# Patient Record
Sex: Female | Born: 1945 | Race: White | Hispanic: No | Marital: Married | Smoking: Never smoker
Health system: Southern US, Community
[De-identification: ages and names within clinical notes are randomized; demographics above are authoritative.]

## PROBLEM LIST (undated history)

## (undated) DIAGNOSIS — I499 Cardiac arrhythmia, unspecified: Secondary | ICD-10-CM

## (undated) DIAGNOSIS — E785 Hyperlipidemia, unspecified: Secondary | ICD-10-CM

## (undated) DIAGNOSIS — K219 Gastro-esophageal reflux disease without esophagitis: Secondary | ICD-10-CM

## (undated) DIAGNOSIS — I10 Essential (primary) hypertension: Secondary | ICD-10-CM

## (undated) DIAGNOSIS — E119 Type 2 diabetes mellitus without complications: Secondary | ICD-10-CM

## (undated) DIAGNOSIS — Z85828 Personal history of other malignant neoplasm of skin: Secondary | ICD-10-CM

## (undated) DIAGNOSIS — G4730 Sleep apnea, unspecified: Secondary | ICD-10-CM

## (undated) DIAGNOSIS — H269 Unspecified cataract: Secondary | ICD-10-CM

## (undated) HISTORY — DX: Gastro-esophageal reflux disease without esophagitis: K21.9

## (undated) HISTORY — PX: CARDIAC ELECTROPHYSIOLOGY STUDY AND ABLATION: SHX1294

## (undated) HISTORY — PX: TUBAL LIGATION: SHX77

## (undated) HISTORY — DX: Hyperlipidemia, unspecified: E78.5

## (undated) HISTORY — PX: LAPAROSCOPIC OOPHERECTOMY: SHX6507

## (undated) HISTORY — PX: APPENDECTOMY: SHX54

## (undated) HISTORY — DX: Unspecified cataract: H26.9

## (undated) HISTORY — PX: HAMMER TOE SURGERY: SHX385

## (undated) HISTORY — PX: OTHER SURGICAL HISTORY: SHX169

## (undated) HISTORY — PX: HAND SURGERY: SHX662

## (undated) HISTORY — DX: Essential (primary) hypertension: I10

## (undated) HISTORY — DX: Type 2 diabetes mellitus without complications: E11.9

## (undated) HISTORY — DX: Personal history of other malignant neoplasm of skin: Z85.828

---

## 2001-10-29 ENCOUNTER — Other Ambulatory Visit: Admission: RE | Admit: 2001-10-29 | Discharge: 2001-10-29 | Payer: Self-pay | Admitting: Family Medicine

## 2004-08-13 ENCOUNTER — Other Ambulatory Visit: Payer: Self-pay

## 2004-08-13 ENCOUNTER — Emergency Department: Payer: Self-pay | Admitting: Emergency Medicine

## 2005-02-20 ENCOUNTER — Ambulatory Visit: Payer: Self-pay | Admitting: Family Medicine

## 2006-02-21 ENCOUNTER — Ambulatory Visit: Payer: Self-pay | Admitting: Family Medicine

## 2007-03-05 ENCOUNTER — Ambulatory Visit: Payer: Self-pay | Admitting: Family Medicine

## 2007-08-26 ENCOUNTER — Emergency Department: Payer: Self-pay | Admitting: Emergency Medicine

## 2007-08-26 ENCOUNTER — Other Ambulatory Visit: Payer: Self-pay

## 2007-09-16 DIAGNOSIS — Z8619 Personal history of other infectious and parasitic diseases: Secondary | ICD-10-CM | POA: Insufficient documentation

## 2007-10-27 ENCOUNTER — Ambulatory Visit: Payer: Self-pay | Admitting: Gynecologic Oncology

## 2007-11-19 ENCOUNTER — Ambulatory Visit: Payer: Self-pay | Admitting: Gynecologic Oncology

## 2007-12-03 ENCOUNTER — Ambulatory Visit: Payer: Self-pay | Admitting: Gynecologic Oncology

## 2007-12-27 ENCOUNTER — Ambulatory Visit: Payer: Self-pay

## 2008-01-07 ENCOUNTER — Ambulatory Visit: Payer: Self-pay

## 2008-03-06 ENCOUNTER — Ambulatory Visit: Payer: Self-pay | Admitting: Family Medicine

## 2008-03-28 ENCOUNTER — Ambulatory Visit: Payer: Self-pay | Admitting: Internal Medicine

## 2008-04-03 DIAGNOSIS — R7309 Other abnormal glucose: Secondary | ICD-10-CM | POA: Insufficient documentation

## 2008-04-03 DIAGNOSIS — IMO0002 Reserved for concepts with insufficient information to code with codable children: Secondary | ICD-10-CM | POA: Insufficient documentation

## 2008-04-10 ENCOUNTER — Ambulatory Visit: Payer: Self-pay | Admitting: Internal Medicine

## 2008-04-28 ENCOUNTER — Ambulatory Visit: Payer: Self-pay | Admitting: Internal Medicine

## 2008-05-28 ENCOUNTER — Ambulatory Visit: Payer: Self-pay | Admitting: Internal Medicine

## 2008-06-02 ENCOUNTER — Ambulatory Visit: Payer: Self-pay | Admitting: Internal Medicine

## 2008-06-28 ENCOUNTER — Ambulatory Visit: Payer: Self-pay | Admitting: Internal Medicine

## 2008-07-28 ENCOUNTER — Ambulatory Visit: Payer: Self-pay | Admitting: Internal Medicine

## 2008-09-28 ENCOUNTER — Ambulatory Visit: Payer: Self-pay | Admitting: Internal Medicine

## 2008-10-21 ENCOUNTER — Ambulatory Visit: Payer: Self-pay | Admitting: Internal Medicine

## 2008-10-26 ENCOUNTER — Ambulatory Visit: Payer: Self-pay | Admitting: Internal Medicine

## 2008-12-26 ENCOUNTER — Ambulatory Visit: Payer: Self-pay | Admitting: Internal Medicine

## 2009-01-20 ENCOUNTER — Ambulatory Visit: Payer: Self-pay | Admitting: Internal Medicine

## 2009-03-17 ENCOUNTER — Ambulatory Visit: Payer: Self-pay | Admitting: Family Medicine

## 2009-05-09 ENCOUNTER — Ambulatory Visit: Payer: Self-pay | Admitting: Internal Medicine

## 2009-05-26 ENCOUNTER — Ambulatory Visit: Payer: Self-pay | Admitting: Internal Medicine

## 2009-07-28 ENCOUNTER — Ambulatory Visit: Payer: Self-pay | Admitting: Internal Medicine

## 2009-08-28 ENCOUNTER — Ambulatory Visit: Payer: Self-pay | Admitting: Internal Medicine

## 2010-01-26 ENCOUNTER — Ambulatory Visit: Payer: Self-pay | Admitting: Internal Medicine

## 2010-02-24 DIAGNOSIS — R319 Hematuria, unspecified: Secondary | ICD-10-CM | POA: Insufficient documentation

## 2010-02-25 ENCOUNTER — Ambulatory Visit: Payer: Self-pay | Admitting: Internal Medicine

## 2010-03-11 DIAGNOSIS — I4891 Unspecified atrial fibrillation: Secondary | ICD-10-CM | POA: Insufficient documentation

## 2010-03-30 ENCOUNTER — Ambulatory Visit: Payer: Self-pay | Admitting: Family Medicine

## 2010-07-28 ENCOUNTER — Ambulatory Visit: Payer: Self-pay | Admitting: Internal Medicine

## 2010-08-04 ENCOUNTER — Ambulatory Visit: Payer: Self-pay

## 2010-08-28 ENCOUNTER — Ambulatory Visit: Payer: Self-pay | Admitting: Internal Medicine

## 2010-10-11 ENCOUNTER — Ambulatory Visit: Payer: Self-pay | Admitting: Unknown Physician Specialty

## 2010-10-11 LAB — HM COLONOSCOPY

## 2010-10-13 LAB — PATHOLOGY REPORT

## 2011-01-27 ENCOUNTER — Ambulatory Visit: Payer: Self-pay | Admitting: Internal Medicine

## 2011-02-26 ENCOUNTER — Ambulatory Visit: Payer: Self-pay | Admitting: Internal Medicine

## 2011-04-28 ENCOUNTER — Ambulatory Visit: Payer: Self-pay | Admitting: Family Medicine

## 2011-06-26 LAB — HM PAP SMEAR: HM PAP: NEGATIVE

## 2012-01-30 ENCOUNTER — Ambulatory Visit: Payer: Self-pay | Admitting: Oncology

## 2012-01-30 LAB — CBC CANCER CENTER
Basophil #: 0 x10 3/mm (ref 0.0–0.1)
Basophil %: 0.8 %
Eosinophil #: 0 x10 3/mm (ref 0.0–0.7)
Eosinophil %: 0.7 %
HGB: 13 g/dL (ref 12.0–16.0)
Lymphocyte #: 1.3 x10 3/mm (ref 1.0–3.6)
MCH: 31.8 pg (ref 26.0–34.0)
MCV: 94 fL (ref 80–100)
Monocyte %: 9.3 %
Neutrophil %: 65.4 %
RBC: 4.09 10*6/uL (ref 3.80–5.20)
RDW: 13.4 % (ref 11.5–14.5)

## 2012-02-26 ENCOUNTER — Ambulatory Visit: Payer: Self-pay | Admitting: Oncology

## 2012-05-09 ENCOUNTER — Ambulatory Visit: Payer: Self-pay | Admitting: Family Medicine

## 2012-07-04 LAB — HM DEXA SCAN: HM DEXA SCAN: NORMAL

## 2012-10-01 DIAGNOSIS — D473 Essential (hemorrhagic) thrombocythemia: Secondary | ICD-10-CM | POA: Insufficient documentation

## 2013-01-26 ENCOUNTER — Ambulatory Visit: Payer: Self-pay | Admitting: Oncology

## 2013-02-04 LAB — CBC CANCER CENTER
Basophil #: 0 x10 3/mm (ref 0.0–0.1)
Basophil %: 0.8 %
Eosinophil %: 0.8 %
HCT: 38.4 % (ref 35.0–47.0)
HGB: 13.6 g/dL (ref 12.0–16.0)
Lymphocyte #: 1.5 x10 3/mm (ref 1.0–3.6)
Lymphocyte %: 24.8 %
MCV: 93 fL (ref 80–100)
Monocyte %: 8.8 %
Neutrophil #: 3.9 x10 3/mm (ref 1.4–6.5)
Neutrophil %: 64.8 %
Platelet: 124 x10 3/mm — ABNORMAL LOW (ref 150–440)
RDW: 13.6 % (ref 11.5–14.5)

## 2013-02-05 DIAGNOSIS — Z85828 Personal history of other malignant neoplasm of skin: Secondary | ICD-10-CM | POA: Insufficient documentation

## 2013-02-25 ENCOUNTER — Ambulatory Visit: Payer: Self-pay | Admitting: Oncology

## 2013-04-25 ENCOUNTER — Ambulatory Visit: Payer: Self-pay | Admitting: Family Medicine

## 2013-05-12 ENCOUNTER — Ambulatory Visit: Payer: Self-pay | Admitting: Family Medicine

## 2013-05-20 ENCOUNTER — Ambulatory Visit: Payer: Self-pay | Admitting: Family Medicine

## 2013-07-14 ENCOUNTER — Ambulatory Visit: Payer: Self-pay

## 2013-07-28 ENCOUNTER — Ambulatory Visit: Payer: Self-pay

## 2014-05-26 LAB — TSH: TSH: 2.65 u[IU]/mL (ref <=5.90)

## 2014-05-28 HISTORY — PX: EYE SURGERY: SHX253

## 2014-06-09 ENCOUNTER — Ambulatory Visit: Payer: Self-pay | Admitting: Family Medicine

## 2014-06-09 LAB — HM MAMMOGRAPHY

## 2014-09-14 LAB — BASIC METABOLIC PANEL
BUN: 16 mg/dL (ref 4–21)
Creatinine: 0.6 mg/dL (ref 0.5–1.1)
Glucose: 141 mg/dL
POTASSIUM: 4.3 mmol/L (ref 3.4–5.3)
SODIUM: 140 mmol/L (ref 137–147)

## 2014-09-14 LAB — LIPID PANEL
Cholesterol: 157 mg/dL (ref 0–200)
HDL: 41 mg/dL (ref 35–70)
LDL CALC: 76 mg/dL
TRIGLYCERIDES: 201 mg/dL — AB (ref 40–160)

## 2014-09-14 LAB — CBC AND DIFFERENTIAL
HEMATOCRIT: 38 % (ref 36–46)
HEMOGLOBIN: 13 g/dL (ref 12.0–16.0)
Platelets: 120 10*3/uL — AB (ref 150–399)
WBC: 5.2 10^3/mL

## 2014-09-14 LAB — HEPATIC FUNCTION PANEL
ALT: 18 U/L (ref 7–35)
AST: 22 U/L (ref 13–35)

## 2014-12-03 ENCOUNTER — Emergency Department
Admit: 2014-12-03 | Disposition: A | Discharge status: Home / Self Care | Payer: Self-pay | Admitting: Emergency Medicine

## 2014-12-03 LAB — BASIC METABOLIC PANEL
Anion Gap: 8 (ref 7–16)
BUN: 19 mg/dL
CALCIUM: 9.7 mg/dL
CHLORIDE: 101 mmol/L
Co2: 30 mmol/L
Creatinine: 0.65 mg/dL
EGFR (Non-African Amer.): >60
GLUCOSE: 150 mg/dL — AB
Potassium: 4.1 mmol/L
Sodium: 139 mmol/L

## 2014-12-03 LAB — CBC
HCT: 39.7 % (ref 35.0–47.0)
HGB: 13.4 g/dL (ref 12.0–16.0)
MCH: 32.1 pg (ref 26.0–34.0)
MCHC: 33.8 g/dL (ref 32.0–36.0)
MCV: 95 fL (ref 80–100)
Platelet: 111 10*3/uL — ABNORMAL LOW (ref 150–440)
RBC: 4.19 10*6/uL (ref 3.80–5.20)
RDW: 13 % (ref 11.5–14.5)
WBC: 6.9 10*3/uL (ref 3.6–11.0)

## 2014-12-03 LAB — TROPONIN I: Troponin-I: <0.03 ng/mL

## 2015-01-11 DIAGNOSIS — I071 Rheumatic tricuspid insufficiency: Secondary | ICD-10-CM | POA: Insufficient documentation

## 2015-01-11 DIAGNOSIS — R001 Bradycardia, unspecified: Secondary | ICD-10-CM | POA: Insufficient documentation

## 2015-01-20 LAB — HEMOGLOBIN A1C: Hgb A1c MFr Bld: 6.2 % — AB (ref 4.0–6.0)

## 2015-03-17 ENCOUNTER — Other Ambulatory Visit: Payer: Self-pay | Admitting: Family Medicine

## 2015-03-17 DIAGNOSIS — E785 Hyperlipidemia, unspecified: Secondary | ICD-10-CM

## 2015-05-17 DIAGNOSIS — Z6837 Body mass index (BMI) 37.0-37.9, adult: Secondary | ICD-10-CM | POA: Insufficient documentation

## 2015-05-17 DIAGNOSIS — K2100 Gastro-esophageal reflux disease with esophagitis, without bleeding: Secondary | ICD-10-CM | POA: Insufficient documentation

## 2015-05-17 DIAGNOSIS — S96919A Strain of unspecified muscle and tendon at ankle and foot level, unspecified foot, initial encounter: Secondary | ICD-10-CM | POA: Insufficient documentation

## 2015-05-17 DIAGNOSIS — E119 Type 2 diabetes mellitus without complications: Secondary | ICD-10-CM | POA: Insufficient documentation

## 2015-05-17 DIAGNOSIS — R109 Unspecified abdominal pain: Secondary | ICD-10-CM | POA: Insufficient documentation

## 2015-05-17 DIAGNOSIS — E1159 Type 2 diabetes mellitus with other circulatory complications: Secondary | ICD-10-CM | POA: Insufficient documentation

## 2015-05-17 DIAGNOSIS — R42 Dizziness and giddiness: Secondary | ICD-10-CM | POA: Insufficient documentation

## 2015-05-17 DIAGNOSIS — I10 Essential (primary) hypertension: Secondary | ICD-10-CM | POA: Insufficient documentation

## 2015-05-17 DIAGNOSIS — R110 Nausea: Secondary | ICD-10-CM | POA: Insufficient documentation

## 2015-05-18 ENCOUNTER — Encounter: Payer: Self-pay | Admitting: Family Medicine

## 2015-05-18 ENCOUNTER — Ambulatory Visit (INDEPENDENT_AMBULATORY_CARE_PROVIDER_SITE_OTHER): Payer: Medicare Other | Admitting: Family Medicine

## 2015-05-18 VITALS — BP 144/72 | HR 72 | Temp 98.0°F | Resp 16 | Ht 65.0 in | Wt 215.0 lb

## 2015-05-18 DIAGNOSIS — E119 Type 2 diabetes mellitus without complications: Secondary | ICD-10-CM

## 2015-05-18 DIAGNOSIS — I480 Paroxysmal atrial fibrillation: Secondary | ICD-10-CM | POA: Diagnosis not present

## 2015-05-18 DIAGNOSIS — Z1231 Encounter for screening mammogram for malignant neoplasm of breast: Secondary | ICD-10-CM

## 2015-05-18 DIAGNOSIS — I10 Essential (primary) hypertension: Secondary | ICD-10-CM

## 2015-05-18 LAB — POCT GLYCOSYLATED HEMOGLOBIN (HGB A1C)
ESTIMATED AVERAGE GLUCOSE: 137
HEMOGLOBIN A1C: 6.4

## 2015-05-18 LAB — POCT UA - MICROALBUMIN: Microalbumin Ur, POC: NEGATIVE mg/L

## 2015-05-18 NOTE — Progress Notes (Signed)
Subjective:    Patient ID: Stacie Porter, female    DOB: November 07, 1945, 69 y.o.   MRN: [id number]  Diabetes She presents for her follow-up (Last A1C on 01/21/2015 and was 6.2%) diabetic visit. She has type 2 diabetes mellitus. There are no hypoglycemic associated symptoms. Pertinent negatives for hypoglycemia include no headaches or sweats. Associated symptoms include weight loss (through weight watchers; lost 18 pounds total). Pertinent negatives for diabetes include no blurred vision, no chest pain, no fatigue, no foot paresthesias, no foot ulcerations, no polydipsia, no polyphagia, no polyuria, no visual change and no weakness. When asked about current treatments, none were reported. She is compliant with treatment all of the time. She is following a generally healthy diet. She participates in exercise daily. Home blood sugar record trend: BS not being checked. An ACE inhibitor/angiotensin II receptor blocker is being taken. Eye exam is current (July 2016, Dr. Gloriann Loan. No diabetic retinopathy).  Hypertension This is a chronic problem. Associated symptoms include palpitations (sees cardiology for A-Fib). Pertinent negatives include no anxiety, blurred vision, chest pain, headaches, malaise/fatigue, neck pain, orthopnea, peripheral edema, shortness of breath or sweats. Treatments tried: Lisinopril-HCTZ 10-12.5 mg, Metoprolol 25 mg. There are no compliance problems.    No recent episodes of atrial fibrillation. Has decreased her Metoprolol to 1/2 twice a day.  BP is up today. Did take Sudafed.    Review of Systems  Constitutional: Positive for weight loss (through weight watchers; lost 18 pounds total). Negative for malaise/fatigue and fatigue.  HENT: Positive for rhinorrhea.   Eyes: Negative for blurred vision.  Respiratory: Negative for shortness of breath.   Cardiovascular: Positive for palpitations (sees cardiology for A-Fib). Negative for chest pain and orthopnea.  Endocrine: Negative for  polydipsia, polyphagia and polyuria.  Musculoskeletal: Negative for neck pain.  Neurological: Negative for weakness and headaches.     Patient Active Problem List   Diagnosis Date Noted  . Abdominal pain 05/17/2015  . Strain of tendon of foot and ankle 05/17/2015  . Adult BMI 30+ 05/17/2015  . Esophagitis, reflux 05/17/2015  . Well controlled diabetes mellitus 05/17/2015  . Dizziness 05/17/2015  . Essential (primary) hypertension 05/17/2015  . Feeling bilious 05/17/2015  . Hyperlipemia 03/17/2015  . Bradycardia 01/11/2015  . TI (tricuspid incompetence) 01/11/2015  . H/O malignant neoplasm of skin 02/05/2013  . 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 A-fib 03/11/2010  . Blood in the urine 02/24/2010  . Abnormal blood sugar 04/03/2008  . Change in blood platelet count 04/03/2008  . Herpes zoster without complication 09/73/5329   No past medical history on file. Current Outpatient Prescriptions on File Prior to Visit  Medication Sig  . BOSWELLIA-GLUCOSAMINE-VIT D PO Take 1 tablet by mouth daily.  . Calcium Carbonate-Vitamin D 600-200 MG-UNIT CAPS Take 1 tablet by mouth daily.  Marland Kitchen lisinopril-hydrochlorothiazide (PRINZIDE,ZESTORETIC) 10-12.5 MG per tablet Take 1 tablet by mouth daily.  . metoprolol tartrate (LOPRESSOR) 25 MG tablet Take 12.5 mg by mouth 2 (two) times daily.   . MULTIPLE VITAMIN PO Take 1 tablet by mouth daily.  Marland Kitchen omeprazole (PRILOSEC) 20 MG capsule Take 40 mg by mouth daily.  . simvastatin (ZOCOR) 20 MG tablet TAKE 1 TABLET BY MOUTH DAILY   No current facility-administered medications on file prior to visit.   Allergies  Allergen Reactions  . Meloxicam Swelling  . Multaq  [Dronedarone]    No past surgical history on file. Social History   Social History  . Marital Status: Married    Spouse  Name: N/A  . Number of Children: N/A  . Years of Education: N/A   Occupational History  . Not on file.   Social History Main Topics  . Smoking  status: Never Smoker   . Smokeless tobacco: Never Used  . Alcohol Use: No  . Drug Use: No  . Sexual Activity: Not on file   Other Topics Concern  . Not on file   Social History Narrative  . No narrative on file   No family history on file.    Objective:   Physical Exam  Constitutional: She is oriented to person, place, and time. She appears well-developed and well-nourished.  Cardiovascular: Normal rate and regular rhythm.   Pulmonary/Chest: Effort normal and breath sounds normal.  Neurological: She is alert and oriented to person, place, and time.  Psychiatric: She has a normal mood and affect. Her behavior is normal. Judgment and thought content normal.   Diabetic Foot Exam - Simple   Simple Foot Form  Diabetic Foot exam was performed with the following findings:  Yes 05/18/2015 11:21 AM  Visual Inspection  See comments:  Yes  Sensation Testing  See comments:  Yes  Pulse Check  Posterior Tibialis and Dorsalis pulse intact bilaterally:  Yes  Comments  Does have second toe, hammer toe on top of great toe. Some numbness in great toe. Otherwise norma.      Results for orders placed or performed in visit on 05/18/15  POCT glycosylated hemoglobin (Hb A1C)  Result Value Ref Range   Hemoglobin A1C 6.4    Est. average glucose Bld gHb Est-mCnc 137   POCT UA - Microalbumin  Result Value Ref Range   Microalbumin Ur, POC Negative mg/L     BP 144/72 mmHg  Pulse 72  Temp(Src) 98 F (36.7 C) (Oral)  Resp 16  Ht 5\' 5"  (1.651 m)  Wt 215 lb (97.523 kg)  BMI 35.78 kg/m2       Assessment & Plan:   1. Well controlled diabetes mellitus Stable. Continue Weight Watchers.  Recheck in January.  FLu shot at pharmacy when feeling better.    - POCT glycosylated hemoglobin (Hb A1C) - POCT UA - Microalbumin  2. Paroxysmal atrial fibrillation Stable. No episodes since ablation.   3. Essential (primary) hypertension Up today. Recommend monitor blood pressure. No sudafed.   Margarita Rana, MD

## 2015-06-21 ENCOUNTER — Ambulatory Visit
Admission: RE | Admit: 2015-06-21 | Discharge: 2015-06-21 | Disposition: A | Discharge status: Home / Self Care | Payer: Medicare Other | Source: Ambulatory Visit | Attending: Family Medicine | Admitting: Family Medicine

## 2015-06-21 ENCOUNTER — Other Ambulatory Visit: Payer: Self-pay | Admitting: Family Medicine

## 2015-06-21 DIAGNOSIS — Z1231 Encounter for screening mammogram for malignant neoplasm of breast: Secondary | ICD-10-CM

## 2015-07-12 DIAGNOSIS — I480 Paroxysmal atrial fibrillation: Secondary | ICD-10-CM | POA: Insufficient documentation

## 2015-07-12 DIAGNOSIS — E782 Mixed hyperlipidemia: Secondary | ICD-10-CM | POA: Insufficient documentation

## 2015-07-12 DIAGNOSIS — I340 Nonrheumatic mitral (valve) insufficiency: Secondary | ICD-10-CM | POA: Insufficient documentation

## 2015-07-12 DIAGNOSIS — E785 Hyperlipidemia, unspecified: Secondary | ICD-10-CM | POA: Insufficient documentation

## 2015-07-12 DIAGNOSIS — E1169 Type 2 diabetes mellitus with other specified complication: Secondary | ICD-10-CM | POA: Insufficient documentation

## 2015-08-02 ENCOUNTER — Ambulatory Visit (INDEPENDENT_AMBULATORY_CARE_PROVIDER_SITE_OTHER): Payer: Medicare Other | Admitting: Family Medicine

## 2015-08-02 ENCOUNTER — Encounter: Payer: Self-pay | Admitting: Family Medicine

## 2015-08-02 VITALS — BP 144/68 | HR 72 | Temp 97.8°F | Resp 16 | Wt 214.0 lb

## 2015-08-02 DIAGNOSIS — N644 Mastodynia: Secondary | ICD-10-CM

## 2015-08-02 NOTE — Progress Notes (Signed)
Subjective:    Patient ID: Stacie Porter, female    DOB: [birth year], 69 y.o.   MRN: [id number]  HPI  Breast Pain Intermittent right breast pain since 07/29/2015 (x 5 days). Pt mainly notices pain with palpation, and reports it is a "radiating" pain. Pt just had a bilateral screening mammogram on 10/242016, and the result was BI-RADS 1. Pt denies breast swelling, discoloration, dimpling, nipple discharge, or breast mass.   Review of Systems  Constitutional: Negative for fever, chills, diaphoresis, activity change, appetite change, fatigue and unexpected weight change.  Respiratory: Negative for cough, shortness of breath and wheezing.   Cardiovascular: Negative for chest pain, palpitations and leg swelling.   BP 144/68 mmHg  Pulse 72  Temp(Src) 97.8 F (36.6 C) (Oral)  Resp 16  Wt 214 lb (97.07 kg)   Patient Active Problem List   Diagnosis Date Noted  . 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 MI (mitral incompetence) 07/12/2015  . Paroxysmal atrial fibrillation ([place]) 07/12/2015  . Abdominal pain 05/17/2015  . Strain of tendon of foot and ankle 05/17/2015  . Adult BMI 30+ 05/17/2015  . Esophagitis, reflux 05/17/2015  . Well controlled diabetes mellitus ([place]) 05/17/2015  . Dizziness 05/17/2015  . Essential (primary) hypertension 05/17/2015  . Feeling bilious 05/17/2015  . Hyperlipemia 03/17/2015  . Bradycardia 01/11/2015  . TI (tricuspid incompetence) 01/11/2015  . H/O malignant neoplasm of skin 02/05/2013  . Essential hemorrhagic thrombocythemia ([place]) 10/01/2012  . Essential thrombocythemia ([place]) 10/01/2012  . A-fib ([place]) 03/11/2010  . Blood in the urine 02/24/2010  . Abnormal blood sugar 04/03/2008  . Change in blood platelet count 04/03/2008  . Herpes zoster without complication [id number]   No past medical history on file. Current Outpatient Prescriptions on File Prior to Visit  Medication Sig  . BOSWELLIA-GLUCOSAMINE-VIT D PO Take 1 tablet  by mouth daily.  . Calcium Carbonate-Vitamin D 600-200 MG-UNIT CAPS Take 1 tablet by mouth daily.  Marland Kitchen lisinopril-hydrochlorothiazide (PRINZIDE,ZESTORETIC) 10-12.5 MG per tablet Take 1 tablet by mouth daily.  . metoprolol tartrate (LOPRESSOR) 25 MG tablet Take 12.5 mg by mouth 2 (two) times daily.   . MULTIPLE VITAMIN PO Take 1 tablet by mouth daily.  Marland Kitchen omeprazole (PRILOSEC) 20 MG capsule Take 40 mg by mouth daily.  . simvastatin (ZOCOR) 20 MG tablet TAKE 1 TABLET BY MOUTH DAILY   No current facility-administered medications on file prior to visit.   Allergies  Allergen Reactions  . Meloxicam Swelling  . Multaq  [Dronedarone]    No past surgical history on file. Social History   Social History  . Marital Status: Married    Spouse Name: N/A  . Number of Children: N/A  . Years of Education: N/A   Occupational History  . Not on file.   Social History Main Topics  . Smoking status: Never Smoker   . Smokeless tobacco: Never Used  . Alcohol Use: No  . Drug Use: No  . Sexual Activity: Not on file   Other Topics Concern  . Not on file   Social History Narrative   Family History  Problem Relation Age of Onset  . Breast cancer Neg Hx      .result     Objective:   Physical Exam  Constitutional: She appears well-developed and well-nourished. No distress.  Genitourinary: There is breast tenderness (tenderness at 3 o'clock position on right nipple, no obvious lesions ). No breast swelling, discharge or bleeding.  Skin: She is not diaphoretic.  BP 144/68 mmHg  Pulse 72  Temp(Src) 97.8 F (36.6 C) (Oral)  Resp 16  Wt 214 lb (97.07 kg)     Assessment & Plan:  1. Breast pain, right Unclear etiology. Will treat with OTC anti-inflammatories and check ultrasound. Further plan pending these results.   Call sooner if symptoms change.  - US BREAST COMPLETE UNI RIGHT INC AXILLA; Future  Margarita Rana, MD

## 2015-08-03 ENCOUNTER — Telehealth: Payer: Self-pay | Admitting: Family Medicine

## 2015-08-03 DIAGNOSIS — N644 Mastodynia: Secondary | ICD-10-CM

## 2015-08-03 NOTE — Telephone Encounter (Signed)
Per [facility] they will also need an order for left breast ultrasound and diagnostic mammogram

## 2015-08-03 NOTE — Telephone Encounter (Signed)
Ordered. Thank you for checking up on this. Renaldo Fiddler, CMA

## 2015-08-03 NOTE — Telephone Encounter (Signed)
I was unable to get pt into [place] until 07/2015 to get diagnostic mammogram/ultrasound.Pt was upset about this and wants to know if you think it is ok to wait this long

## 2015-08-03 NOTE — Telephone Encounter (Signed)
Per [facility]

## 2015-08-06 ENCOUNTER — Ambulatory Visit (INDEPENDENT_AMBULATORY_CARE_PROVIDER_SITE_OTHER): Payer: Medicare Other | Admitting: Family Medicine

## 2015-08-06 ENCOUNTER — Encounter: Payer: Self-pay | Admitting: Family Medicine

## 2015-08-06 VITALS — BP 132/74 | HR 64 | Temp 97.9°F | Resp 16 | Wt 213.0 lb

## 2015-08-06 DIAGNOSIS — I10 Essential (primary) hypertension: Secondary | ICD-10-CM

## 2015-08-06 DIAGNOSIS — N644 Mastodynia: Secondary | ICD-10-CM

## 2015-08-06 DIAGNOSIS — J069 Acute upper respiratory infection, unspecified: Secondary | ICD-10-CM | POA: Diagnosis not present

## 2015-08-06 MED ORDER — ASPIRIN EC 81 MG PO TBEC: 81.0000 mg | DELAYED_RELEASE_TABLET | Freq: Every day | ORAL | Status: DC

## 2015-08-06 NOTE — Progress Notes (Signed)
Subjective:     Patient ID: Stacie Porter, female   DOB: 1946-06-04, 69 y.o.   MRN: [id number]  Chief Complaint  Patient presents with  . Sinusitis  . Hypertension    Pt is concerned because her BP has been elevated, up to 189/72. Pt currently taking Lisinopril-HCTZ 10-12.5 mg po qd, and Metoprolol Tartrate 25 mg 1/2 tab po in the am, and 1/2 tab po qhs    Sinusitis This is a new problem. The current episode started in the past 7 days (3 days ago). The problem has been gradually improving since onset. There has been no fever. She is experiencing no pain. Associated symptoms include congestion. Pertinent negatives include no coughing, shortness of breath, sneezing, sore throat or swollen glands. Sinus pressure: frontal, temporal. Treatments tried: Coricidin. The treatment provided no relief (Has not taken any sudaphed.).  Hypertension Pertinent negatives include no chest pain, palpitations or shortness of breath.  Pt measured BP yesterday was high. Measured with battery-powered cuff. 5:13pm was 187/72, highest 190/66. Pt sat down and rested 5:30pm was 154/66. She took evening dose metoprolol 12.5 mg, BPs remained in 140s-150s/60s. No associated sx including no headache, vision changes, chest pain, SOB, numbness, tingling. This am BP was 151/59.    She did not have a particularly stressful day, just got back from running errands before measuring high BP. She has been taking BP meds as prescribed. Took Coricidin and no meds that would raise BP for congestion. In general has been more stressed because sister is scheduled to have lung surgery for stage I lung cancer 12/29 and she is really the only one who can help to support her. Does get a little tearful talking about this.    Breast Pain Last seen 12/5 for breast pain. Taking ibuprofen, which is is helping, still slightly sore around nipple. She did not take ibuprofen this am. Last bilateral screening mammogram on 10/242016 result BI-RADS 1.  Ultrasound scheduled Dec 20th, which might be difficult to make due to holidays. Pain is improving.  Only sore when touching it. Still no rash or other lesions noted.    Review of Systems  Constitutional: Negative for activity change and unexpected weight change.  HENT: Positive for congestion. Negative for sneezing and sore throat. Sinus pressure: frontal, temporal.   Eyes: Negative.   Respiratory: Negative for cough, chest tightness and shortness of breath.   Cardiovascular: Negative for chest pain and palpitations.  Gastrointestinal: Negative.   Endocrine: Negative.   Genitourinary: Negative.   Musculoskeletal: Negative.   Skin: Negative.   Neurological: Negative.   Hematological: Negative.   Psychiatric/Behavioral: Negative.      Patient Active Problem List   Diagnosis Date Noted  . 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 MI (mitral incompetence) 07/12/2015  . Paroxysmal atrial fibrillation ([place]) 07/12/2015  . Abdominal pain 05/17/2015  . Strain of tendon of foot and ankle 05/17/2015  . Adult BMI 30+ 05/17/2015  . Esophagitis, reflux 05/17/2015  . Well controlled diabetes mellitus ([place]) 05/17/2015  . Dizziness 05/17/2015  . Essential (primary) hypertension 05/17/2015  . Feeling bilious 05/17/2015  . Hyperlipemia 03/17/2015  . Bradycardia 01/11/2015  . TI (tricuspid incompetence) 01/11/2015  . H/O malignant neoplasm of skin 02/05/2013  . 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 Essential thrombocythemia ([place]) 10/01/2012  . A-fib ([place]) 03/11/2010  . Blood in the urine 02/24/2010  . Abnormal blood sugar 04/03/2008  . Change in blood platelet count 04/03/2008  . Herpes zoster without  complication [id number]    Previous Medications   BOSWELLIA-GLUCOSAMINE-VIT D PO    Take 1 tablet by mouth daily.   CALCIUM CARBONATE-VITAMIN D 600-200 MG-UNIT CAPS    Take 1 tablet by mouth daily.   IBUPROFEN (ADVIL,MOTRIN) 200 MG TABLET    Take  400 mg by mouth 3 (three) times daily.   LISINOPRIL-HYDROCHLOROTHIAZIDE (PRINZIDE,ZESTORETIC) 10-12.5 MG PER TABLET    Take 1 tablet by mouth daily.   METOPROLOL TARTRATE (LOPRESSOR) 25 MG TABLET    Take 12.5 mg by mouth 2 (two) times daily.    MULTIPLE VITAMIN PO    Take 1 tablet by mouth daily.   OMEPRAZOLE (PRILOSEC) 20 MG CAPSULE    Take 40 mg by mouth daily.   SIMVASTATIN (ZOCOR) 20 MG TABLET    TAKE 1 TABLET BY MOUTH DAILY    Allergies  Allergen Reactions  . Meloxicam Swelling  . Multaq  [Dronedarone]   .diamge  No past surgical history on file.   Family History  Problem Relation Age of Onset  . Breast cancer Neg Hx    Social History   Social History  . Marital Status: Married    Spouse Name: N/A  . Number of Children: N/A  . Years of Education: N/A   Occupational History  . Not on file.   Social History Main Topics  . Smoking status: Never Smoker   . Smokeless tobacco: Never Used  . Alcohol Use: No  . Drug Use: No  . Sexual Activity: Not on file   Other Topics Concern  . Not on file   Social History Narrative  Recently more stressed. Pt's sister is diagnosed with stage I lung and stage I thyroid cancer. Lobectomy scheduled for 08/26/15. Will have thyroid surgery later.       Objective:   Physical Exam  Constitutional: She is oriented to person, place, and time. She appears well-developed and well-nourished. No distress.  HENT:  Head: Normocephalic and atraumatic.  Right Ear: External ear normal.  Left Ear: External ear normal.  Nose: Nose normal.  Mouth/Throat: Oropharynx is clear and moist. No oropharyngeal exudate.  Rhinorrhea noted.   Eyes: Right eye exhibits no discharge. Left eye exhibits no discharge.  Neck: Normal range of motion. Neck supple.  Cardiovascular: Normal rate and regular rhythm.  Exam reveals no gallop and no friction rub.   Murmur (Grade 1/6 holosystolic) heard. Pulmonary/Chest: Effort normal and breath sounds normal. No  respiratory distress. She has no wheezes.  Abdominal: Soft.  Lymphadenopathy:    She has no cervical adenopathy.  Neurological: She is alert and oriented to person, place, and time. No cranial nerve deficit. She exhibits normal muscle tone.  Skin: Skin is warm and dry. She is not diaphoretic. No erythema.  Psychiatric: She has a normal mood and affect. Her behavior is normal. Judgment and thought content normal.  Tearful when discussing her sister.    Vitals reviewed.   BP 132/74 mmHg  Pulse 64  Temp(Src) 97.9 F (36.6 C) (Oral)  Resp 16  Wt 213 lb (96.616 kg)  SpO2 97%     Assessment:     Elevated BP.  URI. Breast pain.   Assessment and plan below.     Plan:     1. Essential (primary) hypertension Worsening. Suspect related to Ibuprofen. Will stop Ibuprofen and continue to monitor. If elevated, take and extra Metoprolol and call the office. Will check kidney function.  Call if worsens or does not improve.  - CBC with  Differential/Platelet - Comprehensive Metabolic Panel (CMET)  2. Breast pain Improved. Stop Ibuprofen for now.    3. Upper respiratory infection Improving. Patient instructed to call back if condition worsens or does not continue to improve.     Margarita Rana, MD

## 2015-08-07 DIAGNOSIS — J069 Acute upper respiratory infection, unspecified: Secondary | ICD-10-CM | POA: Insufficient documentation

## 2015-08-07 DIAGNOSIS — N644 Mastodynia: Secondary | ICD-10-CM | POA: Insufficient documentation

## 2015-08-07 LAB — COMPREHENSIVE METABOLIC PANEL
A/G RATIO: 1.6 (ref 1.1–2.5)
ALK PHOS: 72 IU/L (ref 39–117)
ALT: 17 IU/L (ref 0–32)
AST: 22 IU/L (ref 0–40)
Albumin: 4.2 g/dL (ref 3.6–4.8)
BILIRUBIN TOTAL: 0.9 mg/dL (ref 0.0–1.2)
BUN / CREAT RATIO: 23 (ref 11–26)
BUN: 15 mg/dL (ref 8–27)
CHLORIDE: 101 mmol/L (ref 97–106)
CO2: 29 mmol/L (ref 18–29)
Calcium: 9.7 mg/dL (ref 8.7–10.3)
Creatinine, Ser: 0.66 mg/dL (ref 0.57–1.00)
GFR calc Af Amer: 105 mL/min/{1.73_m2} (ref >59)
GFR calc non Af Amer: 91 mL/min/{1.73_m2} (ref >59)
GLUCOSE: 113 mg/dL — AB (ref 65–99)
Globulin, Total: 2.6 g/dL (ref 1.5–4.5)
POTASSIUM: 4.3 mmol/L (ref 3.5–5.2)
Sodium: 142 mmol/L (ref 136–144)
Total Protein: 6.8 g/dL (ref 6.0–8.5)

## 2015-08-07 LAB — CBC WITH DIFFERENTIAL/PLATELET
BASOS ABS: 0 10*3/uL (ref 0.0–0.2)
Basos: 0 %
EOS (ABSOLUTE): 0.1 10*3/uL (ref 0.0–0.4)
Eos: 1 %
Hematocrit: 40.5 % (ref 34.0–46.6)
Hemoglobin: 13.8 g/dL (ref 11.1–15.9)
Immature Grans (Abs): 0 10*3/uL (ref 0.0–0.1)
Immature Granulocytes: 0 %
LYMPHS ABS: 1.8 10*3/uL (ref 0.7–3.1)
Lymphs: 26 %
MCH: 32.1 pg (ref 26.6–33.0)
MCHC: 34.1 g/dL (ref 31.5–35.7)
MCV: 94 fL (ref 79–97)
MONOS ABS: 0.6 10*3/uL (ref 0.1–0.9)
Monocytes: 8 %
NEUTROS ABS: 4.7 10*3/uL (ref 1.4–7.0)
Neutrophils: 65 %
PLATELETS: 142 10*3/uL — AB (ref 150–379)
RBC: 4.3 x10E6/uL (ref 3.77–5.28)
RDW: 13.3 % (ref 12.3–15.4)
WBC: 7.2 10*3/uL (ref 3.4–10.8)

## 2015-08-17 ENCOUNTER — Other Ambulatory Visit: Payer: Self-pay | Admitting: Family Medicine

## 2015-08-17 ENCOUNTER — Ambulatory Visit
Admission: RE | Admit: 2015-08-17 | Discharge: 2015-08-17 | Disposition: A | Discharge status: Home / Self Care | Payer: Medicare Other | Source: Ambulatory Visit | Attending: Family Medicine | Admitting: Family Medicine

## 2015-08-17 DIAGNOSIS — N644 Mastodynia: Secondary | ICD-10-CM

## 2015-09-07 ENCOUNTER — Other Ambulatory Visit: Payer: Self-pay | Admitting: Family Medicine

## 2015-09-07 DIAGNOSIS — K2100 Gastro-esophageal reflux disease with esophagitis, without bleeding: Secondary | ICD-10-CM

## 2015-09-17 ENCOUNTER — Telehealth: Payer: Self-pay | Admitting: Family Medicine

## 2015-09-17 DIAGNOSIS — E119 Type 2 diabetes mellitus without complications: Secondary | ICD-10-CM

## 2015-09-17 DIAGNOSIS — I10 Essential (primary) hypertension: Secondary | ICD-10-CM

## 2015-09-17 DIAGNOSIS — E785 Hyperlipidemia, unspecified: Secondary | ICD-10-CM

## 2015-09-17 NOTE — Telephone Encounter (Signed)
Ok to print out lab slip. Thanks.  

## 2015-09-17 NOTE — Telephone Encounter (Signed)
Pt is requesting to pick up a lab slip on Monday if possible to have labs before her CPE.  CB#[phone]/MW

## 2015-09-20 NOTE — Telephone Encounter (Signed)
Printed off lab slip and informed pt it is ready to pick up. Renaldo Fiddler, CMA

## 2015-09-22 ENCOUNTER — Telehealth: Payer: Self-pay

## 2015-09-22 LAB — CBC WITH DIFFERENTIAL/PLATELET
BASOS ABS: 0 10*3/uL (ref 0.0–0.2)
Basos: 0 %
EOS (ABSOLUTE): 0.1 10*3/uL (ref 0.0–0.4)
Eos: 1 %
HEMATOCRIT: 39.2 % (ref 34.0–46.6)
Hemoglobin: 13.5 g/dL (ref 11.1–15.9)
IMMATURE GRANULOCYTES: 0 %
Immature Grans (Abs): 0 10*3/uL (ref 0.0–0.1)
LYMPHS ABS: 1.6 10*3/uL (ref 0.7–3.1)
Lymphs: 27 %
MCH: 31.7 pg (ref 26.6–33.0)
MCHC: 34.4 g/dL (ref 31.5–35.7)
MCV: 92 fL (ref 79–97)
MONOS ABS: 0.6 10*3/uL (ref 0.1–0.9)
Monocytes: 10 %
NEUTROS PCT: 62 %
Neutrophils Absolute: 3.7 10*3/uL (ref 1.4–7.0)
PLATELETS: 129 10*3/uL — AB (ref 150–379)
RBC: 4.26 x10E6/uL (ref 3.77–5.28)
RDW: 13.7 % (ref 12.3–15.4)
WBC: 6 10*3/uL (ref 3.4–10.8)

## 2015-09-22 LAB — COMPREHENSIVE METABOLIC PANEL
ALK PHOS: 65 IU/L (ref 39–117)
ALT: 18 IU/L (ref 0–32)
AST: 20 IU/L (ref 0–40)
Albumin/Globulin Ratio: 1.8 (ref 1.1–2.5)
Albumin: 4.2 g/dL (ref 3.6–4.8)
BUN/Creatinine Ratio: 28 — ABNORMAL HIGH (ref 11–26)
BUN: 18 mg/dL (ref 8–27)
Bilirubin Total: 0.7 mg/dL (ref 0.0–1.2)
CO2: 26 mmol/L (ref 18–29)
CREATININE: 0.65 mg/dL (ref 0.57–1.00)
Calcium: 9.4 mg/dL (ref 8.7–10.3)
Chloride: 101 mmol/L (ref 96–106)
GFR calc Af Amer: 105 mL/min/{1.73_m2} (ref >59)
GFR calc non Af Amer: 92 mL/min/{1.73_m2} (ref >59)
GLOBULIN, TOTAL: 2.4 g/dL (ref 1.5–4.5)
GLUCOSE: 135 mg/dL — AB (ref 65–99)
POTASSIUM: 4.8 mmol/L (ref 3.5–5.2)
SODIUM: 143 mmol/L (ref 134–144)
Total Protein: 6.6 g/dL (ref 6.0–8.5)

## 2015-09-22 LAB — LIPID PANEL
CHOLESTEROL TOTAL: 158 mg/dL (ref 100–199)
Chol/HDL Ratio: 4 ratio units (ref 0.0–4.4)
HDL: 40 mg/dL (ref >39)
LDL Calculated: 78 mg/dL (ref 0–99)
Triglycerides: 198 mg/dL — ABNORMAL HIGH (ref 0–149)
VLDL Cholesterol Cal: 40 mg/dL (ref 5–40)

## 2015-09-22 LAB — TSH: TSH: 1.84 u[IU]/mL (ref 0.450–4.500)

## 2015-09-22 LAB — HEMOGLOBIN A1C
ESTIMATED AVERAGE GLUCOSE: 140 mg/dL
HEMOGLOBIN A1C: 6.5 % — AB (ref 4.8–5.6)

## 2015-09-22 NOTE — Telephone Encounter (Signed)
Advised pt of lab results. Pt verbally acknowledges understanding. Emily Drozdowski, CMA   

## 2015-09-22 NOTE — Telephone Encounter (Signed)
-----   Message from Margarita Rana, MD sent at 09/22/2015  8:42 AM EST ----- Labs stable. Please notify patient. Thanks.

## 2015-09-27 ENCOUNTER — Encounter: Payer: Self-pay | Admitting: Family Medicine

## 2015-09-27 ENCOUNTER — Ambulatory Visit (INDEPENDENT_AMBULATORY_CARE_PROVIDER_SITE_OTHER): Payer: Medicare Other | Admitting: Family Medicine

## 2015-09-27 VITALS — BP 116/64 | HR 60 | Temp 98.7°F | Resp 16 | Ht 65.0 in | Wt 218.0 lb

## 2015-09-27 DIAGNOSIS — Z0000 Encounter for general adult medical examination without abnormal findings: Secondary | ICD-10-CM

## 2015-09-27 NOTE — Progress Notes (Signed)
Patient ID: Stacie Porter, female   DOB: [birth year], 70 y.o.   MRN: [id number]         Patient: Stacie Porter, Female    DOB: 16-Mar-1946, 70 y.o.   MRN: [id number] Visit Date: 09/27/2015  Today's Provider: Margarita Rana, MD   Chief Complaint  Patient presents with  . Medicare Wellness   Subjective:    Annual wellness visit Stacie Porter is a 70 y.o. female. She feels well. She reports exercising 5 days a week. She reports she is sleeping well.  09/23/14 CPE 08/26/11 Pap-neg; HPV-neg 06/21/15 Mammo-BI-RADS 1 10/11/10 Colon-int hemorrhoids, recheck in 10 yrs 07/04/12 BMD-normal  Lab Results  Component Value Date   WBC 6.0 09/21/2015   HGB 13.4 12/03/2014   HCT 39.2 09/21/2015   PLT 129* 09/21/2015   GLUCOSE 135* 09/21/2015   CHOL 158 09/21/2015   TRIG 198* 09/21/2015   HDL 40 09/21/2015   LDLCALC 78 09/21/2015   ALT 18 09/21/2015   AST 20 09/21/2015   NA 143 09/21/2015   K 4.8 09/21/2015   CL 101 09/21/2015   CREATININE 0.65 09/21/2015   BUN 18 09/21/2015   CO2 26 09/21/2015   TSH 1.840 09/21/2015   HGBA1C 6.5* 09/21/2015     -----------------------------------------------------------   Review of Systems  Constitutional: Negative.   HENT: Negative.   Eyes: Negative.   Respiratory: Negative.   Cardiovascular: Negative.   Gastrointestinal: Negative.   Endocrine: Negative.   Genitourinary: Negative.   Musculoskeletal: Negative.   Skin: Negative.   Allergic/Immunologic: Negative.   Neurological: Negative.   Hematological: Negative.   Psychiatric/Behavioral: Negative.     Social History   Social History  . Marital Status: Married    Spouse Name: N/A  . Number of Children: N/A  . Years of Education: N/A   Occupational History  . Not on file.   Social History Main Topics  . Smoking status: Never Smoker   . Smokeless tobacco: Never Used  . Alcohol Use: No  . Drug Use: No  . Sexual Activity: Not on file   Other Topics Concern  . Not on file     Social History Narrative    Past Medical History  Diagnosis Date  . Hyperlipidemia   . Hypertension      Patient Active Problem List   Diagnosis Date Noted  . Breast pain 08/07/2015  . Upper respiratory infection 08/07/2015  . 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 MI (mitral incompetence) 07/12/2015  . Paroxysmal atrial fibrillation ([place]) 07/12/2015  . Abdominal pain 05/17/2015  . Strain of tendon of foot and ankle 05/17/2015  . Adult BMI 30+ 05/17/2015  . Esophagitis, reflux 05/17/2015  . Well controlled diabetes mellitus ([place]) 05/17/2015  . Dizziness 05/17/2015  . Essential (primary) hypertension 05/17/2015  . Feeling bilious 05/17/2015  . Hyperlipemia 03/17/2015  . TI (tricuspid incompetence) 01/11/2015  . H/O malignant neoplasm of skin 02/05/2013  . Essential thrombocythemia ([place]) 10/01/2012  . Blood in the urine 02/24/2010  . Abnormal blood sugar 04/03/2008  . Change in blood platelet count 04/03/2008  . Herpes zoster without complication [id number]    Past Surgical History  Procedure Laterality Date  . Appendectomy    . Hand surgery Bilateral   . Hammer toe surgery Right   . Tubal ligation    . Cather ablation  2011/2014    cardiac ablation for a fib  . Eye surgery Bilateral 05/2014    cataract  . Laparoscopic oopherectomy  Her family history includes Cancer in her sister and sister; Heart disease in her father and mother; Hypertension in her father; Lung cancer in her sister; Ovarian cancer in her sister; Thyroid cancer in her sister. There is no history of Breast cancer.    Previous Medications   ASPIRIN EC 81 MG TABLET    Take 1 tablet (81 mg total) by mouth daily.   CALCIUM CARBONATE-VITAMIN D 600-200 MG-UNIT CAPS    Take 1 tablet by mouth daily.   GLUCOSAMINE-CHONDROITIN 500-400 MG CAPS    Take 1 tablet by mouth daily.   LISINOPRIL-HYDROCHLOROTHIAZIDE (PRINZIDE,ZESTORETIC) 10-12.5 MG PER TABLET    Take 1 tablet by  mouth daily.   METOPROLOL TARTRATE (LOPRESSOR) 25 MG TABLET    Take 12.5 mg by mouth 2 (two) times daily.    MULTIPLE VITAMIN PO    Take 1 tablet by mouth daily.   OMEPRAZOLE (PRILOSEC) 20 MG CAPSULE    TAKE 2 CAPSULES BY MOUTH EVERY DAY   SIMVASTATIN (ZOCOR) 20 MG TABLET    TAKE 1 TABLET BY MOUTH DAILY    Patient Care Team: Margarita Rana, MD as PCP - General (Family Medicine)     Objective:   Vitals: BP 116/64 mmHg  Pulse 60  Temp(Src) 98.7 F (37.1 C) (Oral)  Resp 16  Ht 5\' 5"  (1.651 m)  Wt 218 lb (98.884 kg)  BMI 36.28 kg/m2  SpO2 97%  Physical Exam  Constitutional: She is oriented to person, place, and time. She appears well-developed and well-nourished.  HENT:  Head: Normocephalic and atraumatic.  Right Ear: Tympanic membrane, external ear and ear canal normal.  Left Ear: Tympanic membrane, external ear and ear canal normal.  Nose: Nose normal.  Mouth/Throat: Uvula is midline, oropharynx is clear and moist and mucous membranes are normal.  Eyes: Conjunctivae, EOM and lids are normal. Pupils are equal, round, and reactive to light.  Neck: Trachea normal and normal range of motion. Neck supple. Carotid bruit is not present. No thyroid mass and no thyromegaly present.  Cardiovascular: Normal rate, regular rhythm and normal heart sounds.   Pulmonary/Chest: Effort normal and breath sounds normal.  Abdominal: Soft. Normal appearance and bowel sounds are normal. There is no hepatosplenomegaly. There is no tenderness.  Genitourinary: No breast swelling, tenderness or discharge.  Musculoskeletal: Normal range of motion.  Lymphadenopathy:    She has no cervical adenopathy.    She has no axillary adenopathy.  Neurological: She is alert and oriented to person, place, and time. She has normal strength. No cranial nerve deficit.  Skin: Skin is warm, dry and intact.  Psychiatric: She has a normal mood and affect. Her speech is normal and behavior is normal. Judgment and thought  content normal. Cognition and memory are normal.    Activities of Daily Living In your present state of health, do you have any difficulty performing the following activities: 09/27/2015  Hearing? N  Vision? N  Difficulty concentrating or making decisions? N  Walking or climbing stairs? N  Dressing or bathing? N  Doing errands, shopping? N    Fall Risk Assessment Fall Risk  09/27/2015  Falls in the past year? No     Depression Screen PHQ 2/9 Scores 09/27/2015  PHQ - 2 Score 0    Cognitive Testing - 6-CIT  Correct? Score   What year is it? yes 0 0 or 4  What month is it? yes 0 0 or 3  Memorize:    Pia Mau,  43,  [place],      What time is it? (within 1 hour) yes 0 0 or 3  Count backwards from 20 yes 0 0, 2, or 4  Name the months of the year yes 0 0, 2, or 4  Repeat name & address above yes 0 0, 2, 4, 6, 8, or 10       TOTAL SCORE  0/28   Interpretation:  Normal  Normal (0-7) Abnormal (8-28)       Assessment & Plan:     Annual Wellness Visit  Reviewed patient's Family Medical History Reviewed and updated list of patient's medical providers Assessment of cognitive impairment was done Assessed patient's functional ability Established a written schedule for health screening [place] Completed and Reviewed  Exercise Activities and Dietary recommendations Goals    None      Immunization History  Administered Date(s) Administered  . Influenza,inj,Quad PF,36+ Mos 06/29/2015  . Pneumococcal Conjugate-13 09/23/2014  . Pneumococcal Polysaccharide-23 07/01/2012  . Td 08/28/1997, 03/20/2008  . Tdap 09/23/2014      1. Medicare annual wellness visit, subsequent Stable. Patient advised to continue eating healthy and exercise daily. Reviewed labs from last week. Will recheck ov and recheck A1c at ov in 4 months.      Patient seen and examined by Dr. Jerrell Belfast, and note scribed by Philbert Riser. Dim[place], CMA.  I have  reviewed the document for accuracy and completeness and I agree with above. Jerrell Belfast, MD   Margarita Rana, MD   ------------------------------------------------------------------------------------------------------------

## 2016-01-25 ENCOUNTER — Encounter: Payer: Self-pay | Admitting: Family Medicine

## 2016-01-25 ENCOUNTER — Ambulatory Visit (INDEPENDENT_AMBULATORY_CARE_PROVIDER_SITE_OTHER): Payer: Medicare Other | Admitting: Family Medicine

## 2016-01-25 VITALS — BP 138/68 | HR 68 | Temp 98.4°F | Resp 16 | Wt 221.0 lb

## 2016-01-25 DIAGNOSIS — K2100 Gastro-esophageal reflux disease with esophagitis, without bleeding: Secondary | ICD-10-CM

## 2016-01-25 DIAGNOSIS — I10 Essential (primary) hypertension: Secondary | ICD-10-CM

## 2016-01-25 DIAGNOSIS — E119 Type 2 diabetes mellitus without complications: Secondary | ICD-10-CM | POA: Diagnosis not present

## 2016-01-25 DIAGNOSIS — E785 Hyperlipidemia, unspecified: Secondary | ICD-10-CM

## 2016-01-25 LAB — POCT GLYCOSYLATED HEMOGLOBIN (HGB A1C): HEMOGLOBIN A1C: 6.4

## 2016-01-25 MED ORDER — OMEPRAZOLE 20 MG PO CPDR: DELAYED_RELEASE_CAPSULE | ORAL | Status: DC

## 2016-01-25 NOTE — Progress Notes (Signed)
Patient ID: Stacie Porter, female   DOB: 06-Sep-1945, 70 y.o.   MRN: [id number]         Patient: Stacie Porter Female    DOB: [birth year]   70 y.o.   MRN: [id number] Visit Date: 01/25/2016  Today's Provider: Margarita Rana, MD   Chief Complaint  Patient presents with  . Hypertension  . Hyperlipidemia  . Diabetes   Subjective:    HPI   Diabetes Mellitus Type II, Follow-up:   Lab Results  Component Value Date   HGBA1C 6.5* 09/21/2015   HGBA1C 6.4 05/18/2015   HGBA1C 6.2* 01/20/2015   Last seen for diabetes 4 months ago.  Management since then includes None. She reports excellent compliance with treatment. She is not having side effects.  Current symptoms include none and have been stable. Home blood sugar records: Not being checked.  Episodes of hypoglycemia? no   Most Recent Eye Exam: UTD Weight trend: Slightly increased Current diet: in general, a "healthy" diet   Current exercise: Daily  ------------------------------------------------------------------------   Hypertension, follow-up:  BP Readings from Last 3 Encounters:  01/25/16 138/68  09/27/15 116/64  08/06/15 132/74    She was last seen for hypertension 4 months ago.  Management since that visit includes None .She reports excellent compliance with treatment. She is not having side effects.  She is exercising. She is adherent to low salt diet.   She is experiencing none.  Patient denies chest pain, irregular heart beat, lower extremity edema, palpitations and paroxysmal nocturnal dyspnea.   Cardiovascular risk factors include advanced age (older than 72 for men, 81 for women), diabetes mellitus, dyslipidemia and obesity (BMI >= 30 kg/m2).  ------------------------------------------------------------------------    Lipid/Cholesterol, Follow-up:   Last seen for this 4 months ago.  Management since that visit includes None.  Last Lipid Panel:    Component Value Date/Time   CHOL 158 09/21/2015  0931   CHOL 157 09/14/2014   TRIG 198* 09/21/2015 0931   HDL 40 09/21/2015 0931   HDL 41 09/14/2014   CHOLHDL 4.0 09/21/2015 0931   LDLCALC 78 09/21/2015 0931   LDLCALC 76 09/14/2014    She reports excellent compliance with treatment. She is not having side effects.   Wt Readings from Last 3 Encounters:  01/25/16 221 lb (100.245 kg)  09/27/15 218 lb (98.884 kg)  08/06/15 213 lb (96.616 kg)    ------------------------------------------------------------------------     Allergies  Allergen Reactions  . Ibuprofen     Elevates BP if taken continuously  . Meloxicam Swelling  . Multaq  [Dronedarone]    Previous Medications   ASPIRIN EC 81 MG TABLET    Take 1 tablet (81 mg total) by mouth daily.   CALCIUM CARBONATE-VITAMIN D 600-200 MG-UNIT CAPS    Take 1 tablet by mouth daily.   GLUCOSAMINE-CHONDROITIN 500-400 MG CAPS    Take 1 tablet by mouth daily.   LISINOPRIL-HYDROCHLOROTHIAZIDE (PRINZIDE,ZESTORETIC) 10-12.5 MG PER TABLET    Take 1 tablet by mouth daily.   METOPROLOL TARTRATE (LOPRESSOR) 25 MG TABLET    Take 12.5 mg by mouth 2 (two) times daily.    MULTIPLE VITAMIN PO    Take 1 tablet by mouth daily.   OMEGA-3 FATTY ACIDS (FISH OIL) 1200 MG CAPS    Take 1 capsule by mouth.   SIMVASTATIN (ZOCOR) 20 MG TABLET    TAKE 1 TABLET BY MOUTH DAILY    Review of Systems  Constitutional: Negative.   Respiratory: Negative.   Cardiovascular: Negative.  Gastrointestinal: Negative.   Musculoskeletal: Negative.   Neurological: Negative for dizziness, weakness, light-headedness, numbness and headaches.    Social History  Substance Use Topics  . Smoking status: Never Smoker   . Smokeless tobacco: Never Used  . Alcohol Use: No   Objective:   BP 138/68 mmHg  Pulse 68  Temp(Src) 98.4 F (36.9 C) (Oral)  Resp 16  Wt 221 lb (100.245 kg)  Physical Exam  Constitutional: She is oriented to person, place, and time. She appears well-developed and well-nourished.  Cardiovascular:  Normal rate and regular rhythm.   Murmur heard. Pulmonary/Chest: Effort normal and breath sounds normal.  Neurological: She is alert and oriented to person, place, and time.  Skin: Skin is warm and dry.  Psychiatric: She has a normal mood and affect. Her behavior is normal. Judgment and thought content normal.      Assessment & Plan:     1. Essential (primary) hypertension Stable; continue current medications.    2. Hyperlipemia Stable; continue current medications.  3. Well controlled diabetes mellitus ([place]) A1C Stable 6.4%; continue to work on lifestyle changes will recheck in about four months with Tawanna Sat. - POCT glycosylated hemoglobin (Hb A1C)   Patient was seen and examined by Jerrell Belfast, MD, and note scribed by Ashley Royalty, [place].  I have reviewed the document for accuracy and completeness and I agree with above. - Jerrell Belfast, MD    Margarita Rana, MD  [facility]

## 2016-03-11 ENCOUNTER — Other Ambulatory Visit: Payer: Self-pay | Admitting: Family Medicine

## 2016-03-11 DIAGNOSIS — E785 Hyperlipidemia, unspecified: Secondary | ICD-10-CM

## 2016-03-21 ENCOUNTER — Other Ambulatory Visit: Payer: Self-pay | Admitting: Family Medicine

## 2016-05-19 ENCOUNTER — Emergency Department
Admission: EM | Admit: 2016-05-19 | Discharge: 2016-05-19 | Disposition: A | Discharge status: Home / Self Care | Payer: Medicare Other | Attending: Student in an Organized Health Care Education/Training Program | Admitting: Student in an Organized Health Care Education/Training Program

## 2016-05-19 ENCOUNTER — Encounter: Payer: Self-pay | Admitting: Emergency Medicine

## 2016-05-19 DIAGNOSIS — Z7982 Long term (current) use of aspirin: Secondary | ICD-10-CM | POA: Insufficient documentation

## 2016-05-19 DIAGNOSIS — E119 Type 2 diabetes mellitus without complications: Secondary | ICD-10-CM | POA: Insufficient documentation

## 2016-05-19 DIAGNOSIS — I10 Essential (primary) hypertension: Secondary | ICD-10-CM | POA: Diagnosis not present

## 2016-05-19 DIAGNOSIS — Z85828 Personal history of other malignant neoplasm of skin: Secondary | ICD-10-CM | POA: Insufficient documentation

## 2016-05-19 DIAGNOSIS — Z79899 Other long term (current) drug therapy: Secondary | ICD-10-CM | POA: Diagnosis not present

## 2016-05-19 DIAGNOSIS — R51 Headache: Secondary | ICD-10-CM | POA: Diagnosis present

## 2016-05-19 MED ORDER — PROCHLORPERAZINE EDISYLATE 5 MG/ML IJ SOLN: 10.0000 mg | Freq: Once | INTRAMUSCULAR | Status: DC

## 2016-05-19 MED ORDER — DIPHENHYDRAMINE HCL 50 MG/ML IJ SOLN: 25.0000 mg | Freq: Once | INTRAMUSCULAR | Status: DC

## 2016-05-19 NOTE — ED Triage Notes (Signed)
C/o headache.  States she took her bp today and it was 177/77 so her MD recommended she come to the ER for further eval.  Ambulates well, grips equal, smile symmetrical.

## 2016-05-19 NOTE — ED Provider Notes (Signed)
[facility] Emergency Department Provider Note    First MD Initiated Contact with Patient 05/19/16 1836     (approximate)  I have reviewed the triage vital signs and the nursing notes.   HISTORY  Chief Complaint Headache    HPI MYERS HAWKS is a 70 y.o. female who is very pleasant who presents to the ER due to concern for elevated blood pressure and a headache that she noted this afternoon. She currently rates the headache as a 1 out of 10 in severity and is located in the frontal region. She denies any numbness or tingling. States that she was feeling "off" today but denies any stressors. No nausea or vomiting. No shortness of breath or chest pain. Does not typically have headaches but this is not the worse headache of her life. This was a gradual onset headache. She checked her blood pressure and noted that it was high. She called her primary care physician's office and spoke with a nurse who told her to come to the ER to be evaluated. On arrival to the ER she was able to ambulate and with a steady gait and denies any discomfort at this time. Does not want any medications.   Past Medical History:  Diagnosis Date  . Hyperlipidemia   . Hypertension     Patient Active Problem List   Diagnosis Date Noted  . Breast pain 08/07/2015  . Upper respiratory infection 08/07/2015  . 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 MI (mitral incompetence) 07/12/2015  . Paroxysmal atrial fibrillation ([place]) 07/12/2015  . Abdominal pain 05/17/2015  . Strain of tendon of foot and ankle 05/17/2015  . Adult BMI 30+ 05/17/2015  . Esophagitis, reflux 05/17/2015  . Well controlled diabetes mellitus ([place]) 05/17/2015  . Dizziness 05/17/2015  . Essential (primary) hypertension 05/17/2015  . Feeling bilious 05/17/2015  . Hyperlipemia 03/17/2015  . TI (tricuspid incompetence) 01/11/2015  . H/O malignant neoplasm of skin 02/05/2013  . Essential  thrombocythemia ([place]) 10/01/2012  . Blood in the urine 02/24/2010  . Abnormal blood sugar 04/03/2008  . Change in blood platelet count 04/03/2008  . Herpes zoster without complication [id number]    Past Surgical History:  Procedure Laterality Date  . APPENDECTOMY    . cather ablation  2011/2014   cardiac ablation for a fib  . EYE SURGERY Bilateral 05/2014   cataract  . HAMMER TOE SURGERY Right   . HAND SURGERY Bilateral   . LAPAROSCOPIC OOPHERECTOMY    . TUBAL LIGATION      Prior to Admission medications   Medication Sig Start Date End Date Taking? Authorizing Provider  aspirin 81 MG EC tablet TAKE 1 TABLET BY MOUTH EVERY DAY 03/21/16   Mar Daring, PA-C  Calcium Carbonate-Vitamin D 600-200 MG-UNIT CAPS Take 1 tablet by mouth daily. 01/18/07   Historical Provider, MD  Glucosamine-Chondroitin 500-400 MG CAPS Take 1 tablet by mouth daily.    Historical Provider, MD  lisinopril-hydrochlorothiazide (PRINZIDE,ZESTORETIC) 10-12.5 MG per tablet Take 1 tablet by mouth daily.    Historical Provider, MD  metoprolol tartrate (LOPRESSOR) 25 MG tablet Take 12.5 mg by mouth 2 (two) times daily.     Historical Provider, MD  MULTIPLE VITAMIN PO Take 1 tablet by mouth daily. 01/18/07   Historical Provider, MD  Omega-3 Fatty Acids (FISH OIL) 1200 MG CAPS Take 1 capsule by mouth.    Historical Provider, MD  omeprazole (PRILOSEC) 20 MG capsule TAKE 1 CAPSULES BY MOUTH EVERY DAY  01/25/16   Margarita Rana, MD  simvastatin (ZOCOR) 20 MG tablet TAKE 1 TABLET BY MOUTH DAILY 03/13/16   Mar Daring, PA-C    Allergies Ibuprofen; Meloxicam; and Multaq  [dronedarone]  Family History  Problem Relation Age of Onset  . Ovarian cancer Sister   . Cancer Sister     ovarian  . Lung cancer Sister   . Cancer Sister     lung and thyroid  . Thyroid cancer Sister   . Heart disease Mother   . Heart disease Father   . Hypertension Father   . Breast cancer Neg Hx     Social History Social History    Substance Use Topics  . Smoking status: Never Smoker  . Smokeless tobacco: Never Used  . Alcohol use No    Review of Systems Patient denies headaches, rhinorrhea, blurry vision, numbness, shortness of breath, chest pain, edema, cough, abdominal pain, nausea, vomiting, diarrhea, dysuria, fevers, rashes or hallucinations unless otherwise stated above in HPI. ____________________________________________   PHYSICAL EXAM:  VITAL SIGNS: Vitals:   05/19/16 1837 05/19/16 1900  BP: (!) 159/73 (!) 149/92  Pulse: 65 68  Resp: 17 17  Temp:      Constitutional: Alert and oriented. Well appearing and in no acute distress. Eyes: Conjunctivae are normal. PERRL. EOMI. Head: Atraumatic. Nose: No congestion/rhinnorhea. Mouth/Throat: Mucous membranes are moist.  Oropharynx non-erythematous. Neck: No stridor. Painless ROM. No cervical spine tenderness to palpation Hematological/Lymphatic/Immunilogical: No cervical lymphadenopathy. Cardiovascular: Normal rate, regular rhythm. Grossly normal heart sounds.  Good peripheral circulation. Respiratory: Normal respiratory effort.  No retractions. Lungs CTAB. Gastrointestinal: Soft and nontender. No distention. No abdominal bruits. No CVA tenderness. Genitourinary:  Musculoskeletal: No lower extremity tenderness nor edema.  No joint effusions. Neurologic: CN- intact.  No facial droop, Normal FNF.  Normal heel to shin.  Sensation intact bilaterally. Normal speech and language. No gross focal neurologic deficits are appreciated. No gait instability. Skin:  Skin is warm, dry and intact. No rash noted. Psychiatric: Mood and affect are normal. Speech and behavior are normal.  ____________________________________________   LABS (all labs ordered are listed, but only abnormal results are displayed)  No results found for this or any previous visit (from the past 24 hour(s)). ____________________________________________  EKG My review and personal  interpretation at Time: 18:12   Indication: htn  Rate: 70  Rhythm: normal sinus Axis: normal Other: no acute ischemia ____________________________________________  RADIOLOGY   ____________________________________________   PROCEDURES  Procedure(s) performed: none    Critical Care performed: no ____________________________________________   INITIAL IMPRESSION / ASSESSMENT AND PLAN / ED COURSE  Pertinent labs & imaging results that were available during my care of the patient were reviewed by me and considered in my medical decision making (see chart for details).  DDX: htn, tension headache, malignant htn, sah, iph, cva  Stacie Porter is a 70 y.o. who presents to the ED with elevated blood pressure and mild headache.   Not worst HA ever. Gradual onset. HA similar to previous episodes. Denies focal neurologic symptoms. Denies trauma. No fevers or neck pain. No vision loss. Afebrile in ED. VSS. Exam as above. No meningeal signs. No CN, motor, sensory or cerebellar deficits. Temporal arteries palpable and non-tender. Appears well and non-toxic.  Patient appears to be primarily concerned that her headache was result of having acute ischemic stroke but as she does not appear in any distress and has no focal neurologic deficits or any evidence of meningismus or meningeal irritation do  not believe this is clinically consistent with CVA, subarachnoid hemorrhage or IPH. She has no evidence of other associated injury and damage she has no shortness of breath, chest pain. EKG is without acute ischemia and she has no lower extremity edema. I recommended evaluation with laboratory assessment of renal function and troponin as well as CT imaging of the head due to fully evaluate for any acute pathology despite having a low pretest probability the patient has deferred preferring to go home with the plan to return to the ER should she have any worsening symptoms and has follow-up with her primary physician  on Tuesday.  Patient was able to tolerate PO and was able to ambulate with a steady gait. Have discussed with the patient and available family all diagnostics and treatments performed thus far and all questions were answered to the best of my ability. The patient demonstrates understanding and agreement with plan.     Clinical Course     ____________________________________________   FINAL CLINICAL IMPRESSION(S) / ED DIAGNOSES  Final diagnoses:  Essential hypertension      NEW MEDICATIONS STARTED DURING THIS VISIT:  New Prescriptions   No medications on file     Note:  This document was prepared using Dragon voice recognition software and may include unintentional dictation errors.    Merlyn Lot, MD 05/19/16 [phone]

## 2016-05-19 NOTE — Discharge Instructions (Signed)
Please return to the ER immediately should she have any chest pain, shortness of breath, worsening headache, numbness or heaviness in any of her extremities, dizziness, nausea or visual disturbance.

## 2016-05-19 NOTE — ED Notes (Signed)
Patient states she does not want IV medication. MD notified.

## 2016-05-23 ENCOUNTER — Encounter: Payer: Self-pay | Admitting: Physician Assistant

## 2016-05-23 ENCOUNTER — Ambulatory Visit (INDEPENDENT_AMBULATORY_CARE_PROVIDER_SITE_OTHER): Payer: Medicare Other | Admitting: Physician Assistant

## 2016-05-23 VITALS — BP 146/60 | HR 68 | Temp 98.0°F | Resp 16 | Wt 221.0 lb

## 2016-05-23 DIAGNOSIS — Z1239 Encounter for other screening for malignant neoplasm of breast: Secondary | ICD-10-CM

## 2016-05-23 DIAGNOSIS — I10 Essential (primary) hypertension: Secondary | ICD-10-CM | POA: Diagnosis not present

## 2016-05-23 DIAGNOSIS — E119 Type 2 diabetes mellitus without complications: Secondary | ICD-10-CM

## 2016-05-23 LAB — POCT GLYCOSYLATED HEMOGLOBIN (HGB A1C)
Est. average glucose Bld gHb Est-mCnc: 140
HEMOGLOBIN A1C: 6.5

## 2016-05-23 MED ORDER — LISINOPRIL-HYDROCHLOROTHIAZIDE 20-12.5 MG PO TABS: 1.0000 | ORAL_TABLET | Freq: Every day | ORAL | 1 refills | Status: DC

## 2016-05-23 NOTE — Progress Notes (Signed)
Patient: Stacie Porter Female    DOB: 1946-07-29   70 y.o.   MRN: [id number] Visit Date: 05/23/2016  Today's Provider: Mar Daring, PA-C   Chief Complaint  Patient presents with  . Follow-up  . Diabetes  . Hypertension  . Hyperlipidemia   Subjective:    HPI     Follow up ER visit  Patient was seen in ER for HTN and H/A on 05/19/2016. There were no changes in medications at ER. Did perform EKG which was "fine" per pt. She is also followed by Cardiology, Etta Quill and Dr. Nehemiah Massed. She reports good compliance with treatment. She reports this condition is Unchanged.  Pt reports her headache is improved, but her BP is still elevated with at home readings. States it can run between [id number] systolic and [id number] diastolic. She does have a h/o paroxysmal atrial fibrillation and has had 2 cardio-ablations (per patient).  ------------------------------------------------------------------------------------   Diabetes Mellitus Type II, Follow-up:   Lab Results  Component Value Date   HGBA1C 6.4 01/25/2016   HGBA1C 6.5 (H) 09/21/2015   HGBA1C 6.4 05/18/2015   Last seen for diabetes 4 months ago.  Management since then includes none. She reports excellent compliance with treatment. She is not having side effects. Current symptoms include none and have been unchanged. Home blood sugar records: not being checked.  Episodes of hypoglycemia? no   Most Recent Eye Exam: July with Dr. Gloriann Loan Weight trend: decreasing steadily. Is doing Weight Watchers Current diet: in general, a "healthy" diet   Current exercise: daily; exercise tape and weight lifting twice weekly  ------------------------------------------------------------------------   Hypertension, follow-up:  BP Readings from Last 3 Encounters:  05/23/16 (!) 146/60  05/19/16 (!) 149/92  01/25/16 138/68    She was last seen for hypertension in this office 4 months ago BP at that visit was  138/68. Management since that visit includes none.She reports excellent compliance with treatment. She is not having side effects.  She is exercising. She is not adherent to low salt diet. Weight Watcher's diet is high in sodium per pt.  Outside blood pressures are elevated per pt. SBP range from 130's- 170's. She is experiencing fatigue.  Patient denies chest pain, dyspnea, irregular heart beat, lower extremity edema, near-syncope, orthopnea, palpitations and syncope.   Cardiovascular risk factors include advanced age (older than 16 for men, 95 for women), diabetes mellitus, dyslipidemia and hypertension.    ------------------------------------------------------------------------    Lipid/Cholesterol, Follow-up:   Last seen for this 4 months ago.  Management since that visit includes continue simvastatin 20mg .  Last Lipid Panel:    Component Value Date/Time   CHOL 158 09/21/2015 0931   TRIG 198 (H) 09/21/2015 0931   HDL 40 09/21/2015 0931   CHOLHDL 4.0 09/21/2015 0931   LDLCALC 78 09/21/2015 0931    She reports excellent compliance with treatment. She is not having side effects.   Wt Readings from Last 3 Encounters:  05/23/16 221 lb (100.2 kg)  05/19/16 221 lb (100.2 kg)  01/25/16 221 lb (100.2 kg)    ------------------------------------------------------------------------      Allergies  Allergen Reactions  . Ibuprofen     Elevates BP if taken continuously  . Meloxicam Swelling  . Multaq  [Dronedarone]      Current Outpatient Prescriptions:  .  aspirin 81 MG EC tablet, TAKE 1 TABLET BY MOUTH EVERY DAY, Disp: 120 tablet, Rfl: 1 .  Calcium Carbonate-Vitamin D 600-200 MG-UNIT CAPS, Take 1 tablet by  mouth daily., Disp: , Rfl:  .  Glucosamine-Chondroitin 500-400 MG CAPS, Take 1 tablet by mouth daily., Disp: , Rfl:  .  lisinopril-hydrochlorothiazide (PRINZIDE,ZESTORETIC) 10-12.5 MG per tablet, Take 1 tablet by mouth daily., Disp: , Rfl:  .  metoprolol tartrate  (LOPRESSOR) 25 MG tablet, Take 12.5 mg by mouth 2 (two) times daily. , Disp: , Rfl:  .  MULTIPLE VITAMIN PO, Take 1 tablet by mouth daily., Disp: , Rfl:  .  omeprazole (PRILOSEC) 20 MG capsule, TAKE 1 CAPSULES BY MOUTH EVERY DAY, Disp: 90 capsule, Rfl: 1 .  simvastatin (ZOCOR) 20 MG tablet, TAKE 1 TABLET BY MOUTH DAILY, Disp: 90 tablet, Rfl: 1  Review of Systems  Constitutional: Negative for activity change, appetite change, chills, diaphoresis, fatigue, fever and unexpected weight change.  Respiratory: Negative for cough, chest tightness and shortness of breath.   Cardiovascular: Negative for chest pain, palpitations and leg swelling.  Gastrointestinal: Negative for abdominal pain.  Neurological: Negative for dizziness, seizures, syncope, weakness, light-headedness, numbness and headaches.    Social History  Substance Use Topics  . Smoking status: Never Smoker  . Smokeless tobacco: Never Used  . Alcohol use No   Objective:   BP (!) 146/60 (BP Location: Right Arm, Patient Position: Sitting, Cuff Size: Large)   Pulse 68   Temp 98 F (36.7 C) (Oral)   Resp 16   Wt 221 lb (100.2 kg)   BMI 36.78 kg/m   Physical Exam  Constitutional: She appears well-developed and well-nourished. No distress.  Neck: Normal range of motion. Neck supple. No tracheal deviation present. No thyromegaly present.  Cardiovascular: Normal rate and regular rhythm.  Exam reveals no gallop and no friction rub.   Murmur heard.  Diastolic murmur is present with a grade of 1/6  Pulmonary/Chest: Effort normal and breath sounds normal. No respiratory distress. She has no wheezes. She has no rales.  Musculoskeletal: She exhibits no edema.  Lymphadenopathy:    She has no cervical adenopathy.  Skin: She is not diaphoretic.  Vitals reviewed.      Assessment & Plan:     1. Essential hypertension Will increase lisinopril to 20 mg and continue hydrochlorothiazide at 12.5. She is to check her blood pressure once to  twice per week for the next 4-6 weeks and call with these readings. If blood pressure still elevated may consider adding a calcium channel blocker. Continue metoprolol at 12.5 mg twice daily. If blood pressure readings remain elevated I will see her back sooner. If blood pressure responds well I will see her in January for her annual wellness visit and complete physical exam. She does see cardiology in November. - lisinopril-hydrochlorothiazide (PRINZIDE,ZESTORETIC) 20-12.5 MG tablet; Take 1 tablet by mouth daily.  Dispense: 90 tablet; Refill: 1  2. Well controlled diabetes mellitus ([place]) Hemoglobin A1c improved to 6.4. Continue lifestyle modification. - POCT glycosylated hemoglobin (Hb A1C)  3. Breast cancer screening She is due for her mammogram. Mammogram was ordered as below. I will follow-up pending results. - MM Digital Screening; Future       Mar Daring, PA-C  [facility]

## 2016-05-23 NOTE — Patient Instructions (Signed)
DASH Eating Plan  DASH stands for "Dietary Approaches to Stop Hypertension." The DASH eating plan is a healthy eating plan that has been shown to reduce high blood pressure (hypertension). Additional health benefits may include reducing the risk of type 2 diabetes mellitus, heart disease, and stroke. The DASH eating plan may also help with weight loss.  WHAT DO I NEED TO KNOW ABOUT THE DASH EATING PLAN?  For the DASH eating plan, you will follow these general guidelines:  · Choose foods with a percent daily value for sodium of less than 5% (as listed on the food label).  · Use salt-free seasonings or herbs instead of table salt or sea salt.  · Check with your health care provider or pharmacist before using salt substitutes.  · Eat lower-sodium products, often labeled as "lower sodium" or "no salt added."  · Eat fresh foods.  · Eat more vegetables, fruits, and low-fat dairy products.  · Choose whole grains. Look for the word "whole" as the first word in the ingredient list.  · Choose fish and skinless chicken or turkey more often than red meat. Limit fish, poultry, and meat to 6 oz (170 g) each day.  · Limit sweets, desserts, sugars, and sugary drinks.  · Choose heart-healthy fats.  · Limit cheese to 1 oz (28 g) per day.  · Eat more home-cooked food and less restaurant, buffet, and fast food.  · Limit fried foods.  · Cook foods using methods other than frying.  · Limit canned vegetables. If you do use them, rinse them well to decrease the sodium.  · When eating at a restaurant, ask that your food be prepared with less salt, or no salt if possible.  WHAT FOODS CAN I EAT?  Seek help from a dietitian for individual calorie needs.  Grains  Whole grain or whole wheat bread. Brown rice. Whole grain or whole wheat pasta. Quinoa, bulgur, and whole grain cereals. Low-sodium cereals. Corn or whole wheat flour tortillas. Whole grain cornbread. Whole grain crackers. Low-sodium crackers.  Vegetables  Fresh or frozen vegetables  (raw, steamed, roasted, or grilled). Low-sodium or reduced-sodium tomato and vegetable juices. Low-sodium or reduced-sodium tomato sauce and paste. Low-sodium or reduced-sodium canned vegetables.   Fruits  All fresh, canned (in natural juice), or frozen fruits.  Meat and Other Protein Products  Ground beef (85% or leaner), grass-fed beef, or beef trimmed of fat. Skinless chicken or turkey. Ground chicken or turkey. Pork trimmed of fat. All fish and seafood. Eggs. Dried beans, peas, or lentils. Unsalted nuts and seeds. Unsalted canned beans.  Dairy  Low-fat dairy products, such as skim or 1% milk, 2% or reduced-fat cheeses, low-fat ricotta or cottage cheese, or plain low-fat yogurt. Low-sodium or reduced-sodium cheeses.  Fats and Oils  Tub margarines without trans fats. Light or reduced-fat mayonnaise and salad dressings (reduced sodium). Avocado. Safflower, olive, or canola oils. Natural peanut or almond butter.  Other  Unsalted popcorn and pretzels.  The items listed above may not be a complete list of recommended foods or beverages. Contact your dietitian for more options.  WHAT FOODS ARE NOT RECOMMENDED?  Grains  White bread. White pasta. White rice. Refined cornbread. Bagels and croissants. Crackers that contain trans fat.  Vegetables  Creamed or fried vegetables. Vegetables in a cheese sauce. Regular canned vegetables. Regular canned tomato sauce and paste. Regular tomato and vegetable juices.  Fruits  Dried fruits. Canned fruit in light or heavy syrup. Fruit juice.  Meat and Other Protein   Products  Fatty cuts of meat. Ribs, chicken wings, bacon, sausage, bologna, salami, chitterlings, fatback, hot dogs, bratwurst, and packaged luncheon meats. Salted nuts and seeds. Canned beans with salt.  Dairy  Whole or 2% milk, cream, half-and-half, and cream cheese. Whole-fat or sweetened yogurt. Full-fat cheeses or blue cheese. Nondairy creamers and whipped toppings. Processed cheese, cheese spreads, or cheese  curds.  Condiments  Onion and garlic salt, seasoned salt, table salt, and sea salt. Canned and packaged gravies. Worcestershire sauce. Tartar sauce. Barbecue sauce. Teriyaki sauce. Soy sauce, including reduced sodium. Steak sauce. Fish sauce. Oyster sauce. Cocktail sauce. Horseradish. Ketchup and mustard. Meat flavorings and tenderizers. Bouillon cubes. Hot sauce. Tabasco sauce. Marinades. Taco seasonings. Relishes.  Fats and Oils  Butter, stick margarine, lard, shortening, ghee, and bacon fat. Coconut, palm kernel, or palm oils. Regular salad dressings.  Other  Pickles and olives. Salted popcorn and pretzels.  The items listed above may not be a complete list of foods and beverages to avoid. Contact your dietitian for more information.  WHERE CAN I FIND MORE INFORMATION?  National Heart, Lung, and Blood Institute: www.nhlbi.nih.gov/health/health-topics/topics/dash/     This information is not intended to replace advice given to you by your health care provider. Make sure you discuss any questions you have with your health care provider.     Document Released: 08/03/2011 Document Revised: 09/04/2014 Document Reviewed: 06/18/2013  Elsevier Interactive Patient Education ©2016 Elsevier Inc.

## 2016-06-19 ENCOUNTER — Encounter: Payer: Self-pay | Admitting: Physician Assistant

## 2016-06-19 MED ORDER — FAMOTIDINE IN NACL 20-0.9 MG/50ML-% IV SOLN
INTRAVENOUS | Status: AC
  Filled 2016-06-19: qty 50

## 2016-06-24 ENCOUNTER — Other Ambulatory Visit: Payer: Self-pay | Admitting: Family Medicine

## 2016-06-24 DIAGNOSIS — K2100 Gastro-esophageal reflux disease with esophagitis, without bleeding: Secondary | ICD-10-CM

## 2016-06-27 ENCOUNTER — Telehealth: Payer: Self-pay

## 2016-06-27 ENCOUNTER — Other Ambulatory Visit: Payer: Self-pay | Admitting: Physician Assistant

## 2016-06-27 ENCOUNTER — Ambulatory Visit
Admission: RE | Admit: 2016-06-27 | Discharge: 2016-06-27 | Disposition: A | Discharge status: Home / Self Care | Payer: Medicare Other | Source: Ambulatory Visit | Attending: Physician Assistant | Admitting: Physician Assistant

## 2016-06-27 ENCOUNTER — Encounter: Payer: Self-pay | Admitting: Physician Assistant

## 2016-06-27 DIAGNOSIS — Z1239 Encounter for other screening for malignant neoplasm of breast: Secondary | ICD-10-CM

## 2016-06-27 DIAGNOSIS — Z1231 Encounter for screening mammogram for malignant neoplasm of breast: Secondary | ICD-10-CM | POA: Insufficient documentation

## 2016-06-27 NOTE — Telephone Encounter (Signed)
-----   Message from Mar Daring, [place] sent at 06/27/2016 12:16 PM EDT ----- Normal mammogram. Repeat screening in one year.

## 2016-06-27 NOTE — Telephone Encounter (Signed)
Left detailed message on voicemail.  

## 2016-07-07 MED ORDER — LIDOCAINE-EPINEPHRINE (PF) 1 %-1:200000 IJ SOLN
INTRAMUSCULAR | Status: AC
  Filled 2016-07-07: qty 30

## 2016-08-15 ENCOUNTER — Other Ambulatory Visit: Payer: Self-pay | Admitting: Family Medicine

## 2016-08-15 DIAGNOSIS — K2100 Gastro-esophageal reflux disease with esophagitis, without bleeding: Secondary | ICD-10-CM

## 2016-08-15 NOTE — Telephone Encounter (Signed)
05/23/16 LAST OV

## 2016-08-24 ENCOUNTER — Telehealth: Payer: Self-pay | Admitting: Physician Assistant

## 2016-08-24 DIAGNOSIS — E7800 Pure hypercholesterolemia, unspecified: Secondary | ICD-10-CM

## 2016-08-24 DIAGNOSIS — Z1159 Encounter for screening for other viral diseases: Secondary | ICD-10-CM

## 2016-08-24 DIAGNOSIS — E782 Mixed hyperlipidemia: Secondary | ICD-10-CM

## 2016-08-24 DIAGNOSIS — I10 Essential (primary) hypertension: Secondary | ICD-10-CM

## 2016-08-24 DIAGNOSIS — R7309 Other abnormal glucose: Secondary | ICD-10-CM

## 2016-08-24 DIAGNOSIS — D473 Essential (hemorrhagic) thrombocythemia: Secondary | ICD-10-CM

## 2016-08-24 NOTE — Telephone Encounter (Signed)
Pt would like to have her bloodwork done ahead of her appt and wanted to find out if that was okay with the nurse. -please call - knb

## 2016-08-24 NOTE — Telephone Encounter (Signed)
Please review. Thank you. sd  

## 2016-08-25 NOTE — Telephone Encounter (Signed)
Lab slip printed. 

## 2016-08-25 NOTE — Telephone Encounter (Signed)
Patient advised as below.  

## 2016-08-30 ENCOUNTER — Ambulatory Visit
Admission: RE | Admit: 2016-08-30 | Discharge: 2016-08-30 | Disposition: A | Discharge status: Home / Self Care | Payer: Medicare Other | Source: Ambulatory Visit | Attending: Physician Assistant | Admitting: Physician Assistant

## 2016-08-30 ENCOUNTER — Ambulatory Visit (INDEPENDENT_AMBULATORY_CARE_PROVIDER_SITE_OTHER): Payer: Medicare Other | Admitting: Physician Assistant

## 2016-08-30 ENCOUNTER — Encounter: Payer: Self-pay | Admitting: Physician Assistant

## 2016-08-30 VITALS — BP 150/70 | HR 60 | Temp 98.0°F | Resp 16 | Wt 223.2 lb

## 2016-08-30 DIAGNOSIS — J189 Pneumonia, unspecified organism: Secondary | ICD-10-CM | POA: Diagnosis not present

## 2016-08-30 DIAGNOSIS — R918 Other nonspecific abnormal finding of lung field: Secondary | ICD-10-CM | POA: Diagnosis not present

## 2016-08-30 DIAGNOSIS — R059 Cough, unspecified: Secondary | ICD-10-CM

## 2016-08-30 DIAGNOSIS — R05 Cough: Secondary | ICD-10-CM

## 2016-08-30 MED ORDER — HYDROCODONE-HOMATROPINE 5-1.5 MG/5ML PO SYRP: 5.0000 mL | ORAL_SOLUTION | Freq: Three times a day (TID) | ORAL | 0 refills | Status: DC | PRN

## 2016-08-30 MED ORDER — AZITHROMYCIN 250 MG PO TABS: ORAL_TABLET | ORAL | 0 refills | Status: DC

## 2016-08-30 NOTE — Patient Instructions (Signed)
Cough, Adult Coughing is a reflex that clears your throat and your airways. Coughing helps to heal and protect your lungs. It is normal to cough occasionally, but a cough that happens with other symptoms or lasts a long time may be a sign of a condition that needs treatment. A cough may last only 2-3 weeks (acute), or it may last longer than 8 weeks (chronic). What are the causes? Coughing is commonly caused by:  Breathing in substances that irritate your lungs.  A viral or bacterial respiratory infection.  Allergies.  Asthma.  Postnasal drip.  Smoking.  Acid backing up from the stomach into the esophagus (gastroesophageal reflux).  Certain medicines.  Chronic lung problems, including COPD (or rarely, lung cancer).  Other medical conditions such as heart failure.  Follow these instructions at home: Pay attention to any changes in your symptoms. Take these actions to help with your discomfort:  Take medicines only as told by your health care provider. ? If you were prescribed an antibiotic medicine, take it as told by your health care provider. Do not stop taking the antibiotic even if you start to feel better. ? Talk with your health care provider before you take a cough suppressant medicine.  Drink enough fluid to keep your urine clear or pale yellow.  If the air is dry, use a cold steam vaporizer or humidifier in your bedroom or your home to help loosen secretions.  Avoid anything that causes you to cough at work or at home.  If your cough is worse at night, try sleeping in a semi-upright position.  Avoid cigarette smoke. If you smoke, quit smoking. If you need help quitting, ask your health care provider.  Avoid caffeine.  Avoid alcohol.  Rest as needed.  Contact a health care provider if:  You have new symptoms.  You cough up pus.  Your cough does not get better after 2-3 weeks, or your cough gets worse.  You cannot control your cough with suppressant  medicines and you are losing sleep.  You develop pain that is getting worse or pain that is not controlled with pain medicines.  You have a fever.  You have unexplained weight loss.  You have night sweats. Get help right away if:  You cough up blood.  You have difficulty breathing.  Your heartbeat is very fast. This information is not intended to replace advice given to you by your health care provider. Make sure you discuss any questions you have with your health care provider. Document Released: 02/10/2011 Document Revised: 01/20/2016 Document Reviewed: 10/21/2014 Elsevier Interactive Patient Education  2017 Elsevier Inc.  

## 2016-08-30 NOTE — Progress Notes (Addendum)
Patient: Stacie Porter Female    DOB: [birth year]   70 y.o.   MRN: [id number] Visit Date: 08/30/2016  Today's Provider: Mar Daring, PA-C   Chief Complaint  Patient presents with  . Cough   Subjective:    Cough  This is a new problem. The current episode started 1 to 4 weeks ago (3-4 weeks with the cough). The problem occurs constantly. The cough is non-productive. Pertinent negatives include no chest pain, chills, ear congestion, ear pain, fever, headaches, nasal congestion, postnasal drip, rhinorrhea, sore throat, shortness of breath or wheezing. She has tried OTC cough suppressant (Honey, Delsym, Coricidin and Robitussin) for the symptoms. The treatment provided no relief.  She does report symptoms started after a trip to [place]. She does have GERD and possible hiatal hernia and is on prilosec 20mg  once daily.     Allergies  Allergen Reactions  . Ibuprofen     Elevates BP if taken continuously  . Meloxicam Swelling  . Multaq  [Dronedarone]      Current Outpatient Prescriptions:  .  aspirin 81 MG EC tablet, TAKE 1 TABLET BY MOUTH EVERY DAY, Disp: 120 tablet, Rfl: 1 .  Calcium Carbonate-Vitamin D 600-200 MG-UNIT CAPS, Take 1 tablet by mouth daily., Disp: , Rfl:  .  Glucosamine-Chondroitin 500-400 MG CAPS, Take 1 tablet by mouth daily., Disp: , Rfl:  .  lisinopril-hydrochlorothiazide (PRINZIDE,ZESTORETIC) 20-12.5 MG tablet, Take 1 tablet by mouth daily., Disp: 90 tablet, Rfl: 1 .  metoprolol tartrate (LOPRESSOR) 25 MG tablet, Take 12.5 mg by mouth 2 (two) times daily. , Disp: , Rfl:  .  MULTIPLE VITAMIN PO, Take 1 tablet by mouth daily., Disp: , Rfl:  .  omeprazole (PRILOSEC) 20 MG capsule, TAKE 2 CAPSULES BY MOUTH EVERY DAY, Disp: 180 capsule, Rfl: 1 .  simvastatin (ZOCOR) 20 MG tablet, TAKE 1 TABLET BY MOUTH DAILY, Disp: 90 tablet, Rfl: 1  Review of Systems  Constitutional: Negative for chills and fever.  HENT: Negative for ear pain, postnasal drip,  rhinorrhea, sinus pain, sinus pressure, sore throat and trouble swallowing.   Respiratory: Positive for cough. Negative for chest tightness, shortness of breath and wheezing.   Cardiovascular: Negative for chest pain, palpitations and leg swelling.  Gastrointestinal: Negative for abdominal pain, nausea and vomiting.  Neurological: Negative for dizziness, light-headedness and headaches.    Social History  Substance Use Topics  . Smoking status: Never Smoker  . Smokeless tobacco: Never Used  . Alcohol use No   Objective:   BP (!) 150/70 (BP Location: Right Arm, Patient Position: Sitting, Cuff Size: Large)   Pulse 60   Temp 98 F (36.7 C) (Oral)   Resp 16   Wt 223 lb 3.2 oz (101.2 kg)   BMI 37.14 kg/m   Physical Exam  Constitutional: She appears well-developed and well-nourished. No distress.  HENT:  Head: Normocephalic and atraumatic.  Right Ear: Hearing, tympanic membrane, external ear and ear canal normal.  Left Ear: Hearing, tympanic membrane, external ear and ear canal normal.  Nose: Nose normal.  Mouth/Throat: Uvula is midline, oropharynx is clear and moist and mucous membranes are normal. No oropharyngeal exudate.  Eyes: Conjunctivae are normal. Pupils are equal, round, and reactive to light. Right eye exhibits no discharge. Left eye exhibits no discharge. No scleral icterus.  Neck: Normal range of motion. Neck supple. No tracheal deviation present. No thyromegaly present.  Cardiovascular: Normal rate and regular rhythm.  Exam reveals no gallop and  no friction rub.   Murmur heard. Pulmonary/Chest: Effort normal and breath sounds normal. No stridor. No respiratory distress. She has no wheezes. She has no rales. She exhibits no tenderness.  Musculoskeletal: She exhibits no edema.  Lymphadenopathy:    She has no cervical adenopathy.  Skin: Skin is warm and dry. She is not diaphoretic.  Vitals reviewed.  EXAM(S) PERFORMED: EXAM DATE/TIME: ACCESSION:  DG Chest 2 View  08/30/2016  9:37 AM [id number]    Read By: Fabio Neighbors., MD      CLINICAL DATA:  Cough.  EXAM: CHEST  2 VIEW  COMPARISON:  08/26/2007.  FINDINGS: Mediastinum and hilar structures are normal. Heart size stable. Mild bilateral pulmonary interstitial prominence noted mild pneumonitis cannot be excluded. No pleural effusion or pneumothorax.  IMPRESSION: Mild bilateral pulmonary interstitial prominence noted. Mild pneumonitis cannot be excluded.   Electronically Signed   By: Marcello Moores  Register   On: 08/30/2016 09:41  Signed By:  Fabio Neighbors., MD on 08/30/2016  9:41 AM       Assessment & Plan:     1. Cough Unknown cause. Will get CXR and f/u pending results. Hycodan given as below for nighttime cough. She is to continue robitussin for daytime cough. DDx: residual cough, r/o pneumonia, nodules, worsening GERD or hiatal hernia. She is to call in 1-2 weeks if no improvement for further work up (consider spirometry). - DG Chest 2 View; Future - HYDROcodone-homatropine (HYCODAN) 5-1.5 MG/5ML syrup; Take 5 mLs by mouth every 8 (eight) hours as needed.  Dispense: 180 mL; Refill: 0  2. Pneumonitis Will order zpak as below for pneumonitis. Patient is to call if no improvement in symptoms following treatment.       Mar Daring, PA-C  [facility]

## 2016-08-30 NOTE — Addendum Note (Signed)
Addended by: Mar Daring on: 08/30/2016 10:23 AM   Modules accepted: Orders

## 2016-09-06 ENCOUNTER — Encounter: Payer: Self-pay | Admitting: Physician Assistant

## 2016-09-06 DIAGNOSIS — R059 Cough, unspecified: Secondary | ICD-10-CM

## 2016-09-06 DIAGNOSIS — R05 Cough: Secondary | ICD-10-CM

## 2016-09-06 MED ORDER — BENZONATATE 200 MG PO CAPS: 200.0000 mg | ORAL_CAPSULE | Freq: Three times a day (TID) | ORAL | 0 refills | Status: DC | PRN

## 2016-09-08 ENCOUNTER — Other Ambulatory Visit: Payer: Self-pay | Admitting: Physician Assistant

## 2016-09-08 DIAGNOSIS — E785 Hyperlipidemia, unspecified: Secondary | ICD-10-CM

## 2016-09-11 ENCOUNTER — Other Ambulatory Visit: Payer: Self-pay | Admitting: Physician Assistant

## 2016-09-24 LAB — CBC WITH DIFFERENTIAL/PLATELET

## 2016-09-24 LAB — COMPREHENSIVE METABOLIC PANEL
A/G RATIO: 1.9 (ref 1.2–2.2)
ALT: 18 IU/L (ref 0–32)
AST: 20 IU/L (ref 0–40)
Albumin: 4.5 g/dL (ref 3.6–4.8)
Alkaline Phosphatase: 60 IU/L (ref 39–117)
BUN/Creatinine Ratio: 28 (ref 12–28)
BUN: 19 mg/dL (ref 8–27)
Bilirubin Total: 0.7 mg/dL (ref 0.0–1.2)
CALCIUM: 9.5 mg/dL (ref 8.7–10.3)
CO2: 22 mmol/L (ref 18–29)
Chloride: 100 mmol/L (ref 96–106)
Creatinine, Ser: 0.69 mg/dL (ref 0.57–1.00)
GFR, EST AFRICAN AMERICAN: 103 mL/min/{1.73_m2} (ref >59)
GFR, EST NON AFRICAN AMERICAN: 89 mL/min/{1.73_m2} (ref >59)
GLOBULIN, TOTAL: 2.4 g/dL (ref 1.5–4.5)
Glucose: 140 mg/dL — ABNORMAL HIGH (ref 65–99)
POTASSIUM: 4.8 mmol/L (ref 3.5–5.2)
SODIUM: 140 mmol/L (ref 134–144)
TOTAL PROTEIN: 6.9 g/dL (ref 6.0–8.5)

## 2016-09-24 LAB — LIPID PANEL
CHOL/HDL RATIO: 3.8 ratio (ref 0.0–4.4)
Cholesterol, Total: 167 mg/dL (ref 100–199)
HDL: 44 mg/dL (ref >39)
LDL CALC: 91 mg/dL (ref 0–99)
TRIGLYCERIDES: 162 mg/dL — AB (ref 0–149)
VLDL Cholesterol Cal: 32 mg/dL (ref 5–40)

## 2016-09-24 LAB — TSH: TSH: 2.53 u[IU]/mL (ref 0.450–4.500)

## 2016-09-24 LAB — HEMOGLOBIN A1C
ESTIMATED AVERAGE GLUCOSE: 137 mg/dL
Hgb A1c MFr Bld: 6.4 % — ABNORMAL HIGH (ref 4.8–5.6)

## 2016-09-24 LAB — HEPATITIS C ANTIBODY

## 2016-09-27 ENCOUNTER — Telehealth: Payer: Self-pay

## 2016-09-27 NOTE — Telephone Encounter (Signed)
-----   Message from Mar Daring, [place] sent at 09/26/2016  6:46 PM EST ----- All labs were stable but [facility] did not run CBC. They mention sending information to you for redraw.

## 2016-09-27 NOTE — Telephone Encounter (Signed)
Ok that is fine

## 2016-09-27 NOTE — Telephone Encounter (Signed)
Patient advised. She states she does not want to have labs done because the lab tech left a bruise on her arm, and she does not want to be charged twice. She has not received any notification from [employer] either.

## 2016-09-28 ENCOUNTER — Ambulatory Visit (INDEPENDENT_AMBULATORY_CARE_PROVIDER_SITE_OTHER): Payer: Medicare Other | Admitting: Physician Assistant

## 2016-09-28 ENCOUNTER — Encounter: Payer: Self-pay | Admitting: Physician Assistant

## 2016-09-28 VITALS — BP 128/64 | HR 60 | Temp 98.1°F | Resp 16 | Ht 65.0 in | Wt 223.0 lb

## 2016-09-28 DIAGNOSIS — Z1231 Encounter for screening mammogram for malignant neoplasm of breast: Secondary | ICD-10-CM

## 2016-09-28 DIAGNOSIS — Z124 Encounter for screening for malignant neoplasm of cervix: Secondary | ICD-10-CM

## 2016-09-28 DIAGNOSIS — Z1211 Encounter for screening for malignant neoplasm of colon: Secondary | ICD-10-CM | POA: Diagnosis not present

## 2016-09-28 DIAGNOSIS — Z0000 Encounter for general adult medical examination without abnormal findings: Secondary | ICD-10-CM

## 2016-09-28 DIAGNOSIS — Z1239 Encounter for other screening for malignant neoplasm of breast: Secondary | ICD-10-CM

## 2016-09-28 LAB — IFOBT (OCCULT BLOOD): IMMUNOLOGICAL FECAL OCCULT BLOOD TEST: NEGATIVE

## 2016-09-28 NOTE — Patient Instructions (Signed)

## 2016-09-28 NOTE — Progress Notes (Signed)
Patient: Stacie Porter, Female    DOB: 12-Apr-1946, 71 y.o.   MRN: [id number] Visit Date: 09/28/2016  Today's Provider: Mar Daring, PA-C   Chief Complaint  Patient presents with  . Medicare Wellness   Subjective:    Annual wellness visit Stacie Porter is a 71 y.o. female. She feels well. She reports exercising daily. She reports she is sleeping well.  08/3015 AWE 06/27/16 Mammogram-BI-RADS 1  10/11/10 Colonoscopy-internal hemorrhoids, recheck in 10 years. Dr. Vira Agar. -----------------------------------------------------------   Review of Systems  Constitutional: Negative.   HENT: Negative.   Eyes: Negative.   Respiratory: Positive for apnea.   Cardiovascular: Negative.   Gastrointestinal: Negative.   Endocrine: Negative.   Genitourinary: Negative.   Musculoskeletal: Negative.   Skin: Negative.   Allergic/Immunologic: Negative.   Neurological: Negative.   Hematological: Negative.   Psychiatric/Behavioral: Negative.     Social History   Social History  . Marital status: Married    Spouse name: N/A  . Number of children: 3  . Years of education: N/A   Occupational History  . Not on file.   Social History Main Topics  . Smoking status: Never Smoker  . Smokeless tobacco: Never Used  . Alcohol use No  . Drug use: No  . Sexual activity: Not on file   Other Topics Concern  . Not on file   Social History Narrative  . No narrative on file    Past Medical History:  Diagnosis Date  . Hyperlipidemia   . Hypertension      Patient Active Problem List   Diagnosis Date Noted  . Breast pain 08/07/2015  . 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 MI (mitral incompetence) 07/12/2015  . Paroxysmal atrial fibrillation ([place]) 07/12/2015  . Abdominal pain 05/17/2015  . Strain of tendon of foot and ankle 05/17/2015  . Adult BMI 30+ 05/17/2015  . Esophagitis, reflux 05/17/2015  . Well controlled diabetes mellitus ([place])  05/17/2015  . Dizziness 05/17/2015  . Essential (primary) hypertension 05/17/2015  . Feeling bilious 05/17/2015  . TI (tricuspid incompetence) 01/11/2015  . H/O malignant neoplasm of skin 02/05/2013  . Essential thrombocythemia ([place]) 10/01/2012  . Blood in the urine 02/24/2010  . Abnormal blood sugar 04/03/2008  . Change in blood platelet count 04/03/2008  . Herpes zoster without complication [id number]    Past Surgical History:  Procedure Laterality Date  . APPENDECTOMY    . cather ablation  2011/2014   cardiac ablation for a fib  . EYE SURGERY Bilateral 05/2014   cataract  . HAMMER TOE SURGERY Right   . HAND SURGERY Bilateral   . LAPAROSCOPIC OOPHERECTOMY    . TUBAL LIGATION      Her family history includes Cancer in her sister and sister; Heart disease in her father and mother; Hypertension in her father; Lung cancer in her sister; Ovarian cancer in her sister; Thyroid cancer in her sister.      Current Outpatient Prescriptions:  .  Calcium Carbonate-Vitamin D 600-200 MG-UNIT CAPS, Take 1 tablet by mouth daily., Disp: , Rfl:  .  [facility] ASPIRIN LOW DOSE 81 MG EC tablet, TAKE 1 TABLET BY MOUTH EVERY DAY, Disp: 90 tablet, Rfl: 3 .  Glucosamine-Chondroitin 500-400 MG CAPS, Take 1 tablet by mouth daily., Disp: , Rfl:  .  lisinopril-hydrochlorothiazide (PRINZIDE,ZESTORETIC) 20-12.5 MG tablet, Take 1 tablet by mouth daily., Disp: 90 tablet, Rfl: 1 .  metoprolol tartrate (LOPRESSOR) 25 MG tablet, Take 12.5 mg  by mouth 2 (two) times daily. , Disp: , Rfl:  .  MULTIPLE VITAMIN PO, Take 1 tablet by mouth daily., Disp: , Rfl:  .  omeprazole (PRILOSEC) 20 MG capsule, TAKE 2 CAPSULES BY MOUTH EVERY DAY (Patient taking differently: TAKE 1 CAPSULES BY MOUTH EVERY DAY), Disp: 180 capsule, Rfl: 1 .  simvastatin (ZOCOR) 20 MG tablet, TAKE 1 TABLET BY MOUTH DAILY, Disp: 90 tablet, Rfl: 1  Patient Care Team: Mar Daring, PA-C as PCP - General (Family Medicine)     Objective:   Vitals:  BP 128/64 (BP Location: Left Arm, Patient Position: Sitting, Cuff Size: Large)   Pulse 60   Temp 98.1 F (36.7 C) (Oral)   Resp 16   Ht 5\' 5"  (1.651 m)   Wt 223 lb (101.2 kg)   SpO2 96%   BMI 37.11 kg/m   Physical Exam  Constitutional: She is oriented to person, place, and time. She appears well-developed and well-nourished. No distress.  HENT:  Head: Normocephalic and atraumatic.  Right Ear: Hearing, tympanic membrane, external ear and ear canal normal.  Left Ear: Hearing, tympanic membrane, external ear and ear canal normal.  Nose: Nose normal.  Mouth/Throat: Uvula is midline, oropharynx is clear and moist and mucous membranes are normal. No oropharyngeal exudate.  Eyes: Conjunctivae and EOM are normal. Pupils are equal, round, and reactive to light. Right eye exhibits no discharge. Left eye exhibits no discharge. No scleral icterus.  Neck: Normal range of motion. Neck supple. No JVD present. Carotid bruit is not present. No tracheal deviation present. No thyromegaly present.  Cardiovascular: Normal rate, regular rhythm, normal heart sounds and intact distal pulses.  Exam reveals no gallop and no friction rub.   No murmur heard. Pulmonary/Chest: Effort normal and breath sounds normal. No respiratory distress. She has no wheezes. She has no rales. She exhibits no tenderness. Right breast exhibits no inverted nipple, no mass, no nipple discharge, no skin change and no tenderness. Left breast exhibits no inverted nipple, no mass, no nipple discharge, no skin change and no tenderness. Breasts are symmetrical.  Abdominal: Soft. Bowel sounds are normal. She exhibits no distension and no mass. There is no tenderness. There is no rebound and no guarding. Hernia confirmed negative in the right inguinal area and confirmed negative in the left inguinal area.  Genitourinary: Rectum normal, vagina normal and uterus normal. No breast swelling, tenderness, discharge or bleeding. Pelvic exam was performed  with patient supine. There is no rash, tenderness, lesion or injury on the right labia. There is no rash, tenderness, lesion or injury on the left labia. Cervix exhibits no motion tenderness, no discharge and no friability. Right adnexum displays no mass, no tenderness and no fullness. Left adnexum displays no mass, no tenderness and no fullness. No erythema, tenderness or bleeding in the vagina. No signs of injury around the vagina. No vaginal discharge found.  Musculoskeletal: Normal range of motion. She exhibits no edema or tenderness.  Lymphadenopathy:    She has no cervical adenopathy.       Right: No inguinal adenopathy present.       Left: No inguinal adenopathy present.  Neurological: She is alert and oriented to person, place, and time. She has normal reflexes. No cranial nerve deficit. Coordination normal.  Skin: Skin is warm and dry. No rash noted. She is not diaphoretic.  Psychiatric: She has a normal mood and affect. Her behavior is normal. Judgment and thought content normal.  Vitals reviewed.   Activities  of Daily Living In your present state of health, do you have any difficulty performing the following activities: 09/28/2016  Hearing? N  Vision? N  Difficulty concentrating or making decisions? N  Walking or climbing stairs? N  Dressing or bathing? N  Doing errands, shopping? N  Some recent data might be hidden    Fall Risk Assessment Fall Risk  09/28/2016 09/27/2015  Falls in the past year? No No     Depression Screen PHQ 2/9 Scores 09/28/2016 09/27/2015  PHQ - 2 Score 0 0    Cognitive Testing - 6-CIT  Correct? Score   What year is it? yes 0 0 or 4  What month is it? yes 0 0 or 3  Memorize:    Pia Mau,  43,  [address],  [place],      What time is it? (within 1 hour) yes 0 0 or 3  Count backwards from 20 yes 0 0, 2, or 4  Name the months of the year yes 0 0, 2, or 4  Repeat name & address above yes 0 0, 2, 4, 6, 8, or 10       TOTAL SCORE  0/28     Interpretation:  Normal  Normal (0-7) Abnormal (8-28)   Audit-C Alcohol Use Screening  Question Answer Points  How often do you have alcoholic drink? never 0  On days you do drink alcohol, how many drinks do you typically consume? 0 0  How oftey will you drink 6 or more in a total? never 0  Total Score:  0   A score of 3 or more in women, and 4 or more in men indicates increased risk for alcohol abuse, EXCEPT if all of the points are from question 1.    Assessment & Plan:     Annual Wellness Visit  Reviewed patient's Family Medical History Reviewed and updated list of patient's medical providers Assessment of cognitive impairment was done Assessed patient's functional ability Established a written schedule for health screening [place] Completed and Reviewed  Exercise Activities and Dietary recommendations Goals    None      Immunization History  Administered Date(s) Administered  . Influenza,inj,Quad PF,36+ Mos 06/29/2015  . Pneumococcal Conjugate-13 09/23/2014  . Pneumococcal Polysaccharide-23 07/01/2012  . Td 08/28/1997, 03/20/2008  . Tdap 09/23/2014    Health Maintenance  Topic Date Due  . FOOT EXAM  05/17/2016  . ZOSTAVAX  05/23/2017 (Originally 10/01/2006)  . HEMOGLOBIN A1C  03/18/2017  . OPHTHALMOLOGY EXAM  03/28/2017  . MAMMOGRAM  06/27/2018  . COLONOSCOPY  10/11/2020  . TETANUS/TDAP  09/23/2024  . INFLUENZA VACCINE  Completed  . DEXA SCAN  Completed  . Hepatitis C Screening  Completed  . PNA vac Low Risk Adult  Completed     Discussed health benefits of physical activity, and encouraged her to engage in regular exercise appropriate for her age and condition.    1. Medicare annual wellness visit, subsequent Normal physical exam today.   2. Colon cancer screening OC lite collected today and was negative. - IFOBT POC (occult bld, rslt in office)  3. Cervical cancer screening Pap collected today. Will send as below and f/u  pending results. - Pap IG and HPV (high risk) DNA detection  4. Breast cancer screening Breast exam today was normal. Mammogram was done in 05/2016 and was BiRADS:1. She does perform self breast exams.  ------------------------------------------------------------------------------------------------------------    Mar Daring, PA-C  [facility]

## 2016-10-04 LAB — PAP IG AND HPV HIGH-RISK
HPV, high-risk: NEGATIVE
PAP Smear Comment: 0

## 2016-10-05 ENCOUNTER — Telehealth: Payer: Self-pay

## 2016-10-05 NOTE — Telephone Encounter (Signed)
lmtcb Stacie Porter, CMA  

## 2016-10-05 NOTE — Telephone Encounter (Signed)
Pt advised. Stacie Porter, CMA  

## 2016-10-05 NOTE — Telephone Encounter (Signed)
-----   Message from Mar Daring, [place] sent at 10/04/2016  5:14 PM EST ----- Pap is normal, HPV negative.  Will repeat in 3 years if necessary.

## 2016-11-06 ENCOUNTER — Other Ambulatory Visit: Payer: Self-pay | Admitting: Physician Assistant

## 2016-11-06 DIAGNOSIS — I10 Essential (primary) hypertension: Secondary | ICD-10-CM

## 2017-03-14 ENCOUNTER — Other Ambulatory Visit: Payer: Self-pay | Admitting: Physician Assistant

## 2017-03-14 DIAGNOSIS — E785 Hyperlipidemia, unspecified: Secondary | ICD-10-CM

## 2017-03-28 ENCOUNTER — Encounter: Payer: Self-pay | Admitting: Physician Assistant

## 2017-03-28 ENCOUNTER — Ambulatory Visit (INDEPENDENT_AMBULATORY_CARE_PROVIDER_SITE_OTHER): Payer: Medicare Other | Admitting: Physician Assistant

## 2017-03-28 VITALS — BP 120/66 | HR 62 | Temp 97.9°F | Resp 20 | Ht 65.0 in | Wt 221.8 lb

## 2017-03-28 DIAGNOSIS — I10 Essential (primary) hypertension: Secondary | ICD-10-CM

## 2017-03-28 DIAGNOSIS — K2100 Gastro-esophageal reflux disease with esophagitis, without bleeding: Secondary | ICD-10-CM

## 2017-03-28 DIAGNOSIS — R7309 Other abnormal glucose: Secondary | ICD-10-CM

## 2017-03-28 DIAGNOSIS — E7800 Pure hypercholesterolemia, unspecified: Secondary | ICD-10-CM

## 2017-03-28 DIAGNOSIS — Z6836 Body mass index (BMI) 36.0-36.9, adult: Secondary | ICD-10-CM | POA: Diagnosis not present

## 2017-03-28 LAB — POCT GLYCOSYLATED HEMOGLOBIN (HGB A1C)
ESTIMATED AVERAGE GLUCOSE: 126
Hemoglobin A1C: 6

## 2017-03-28 MED ORDER — OMEPRAZOLE 20 MG PO CPDR: DELAYED_RELEASE_CAPSULE | ORAL | 1 refills | Status: DC

## 2017-03-28 NOTE — Progress Notes (Signed)
Patient: Stacie Porter Female    DOB: 1946-03-10   71 y.o.   MRN: [id number] Visit Date: 03/28/2017  Today's Provider: Mar Daring, PA-C   Chief Complaint  Patient presents with  . Hyperglycemia  . Hypertension  . Hyperlipidemia   Subjective:    HPI  Prediabetes, Follow-up:   Lab Results  Component Value Date   HGBA1C 6.0 03/28/2017   HGBA1C 6.4 (H) 09/18/2016   HGBA1C 6.5 05/23/2016   GLUCOSE 140 (H) 09/18/2016   GLUCOSE 135 (H) 09/21/2015   GLUCOSE 113 (H) 08/06/2015    Last seen for for this6 months ago.  Management since that visit includes diet and exercise. Current symptoms include none and have been improving.  Weight trend: stable Prior visit with dietician: no Current diet: in general, a "healthy" diet   Current exercise: aerobics and walking  Pertinent Labs:    Component Value Date/Time   CHOL 167 09/18/2016 0826   TRIG 162 (H) 09/18/2016 0826   CHOLHDL 3.8 09/18/2016 0826   CREATININE 0.69 09/18/2016 0826   CREATININE 0.65 12/03/2014 0054    Wt Readings from Last 3 Encounters:  03/28/17 221 lb 12.8 oz (100.6 kg)  09/28/16 223 lb (101.2 kg)  08/30/16 223 lb 3.2 oz (101.2 kg)    Hypertension, follow-up:  BP Readings from Last 3 Encounters:  03/28/17 120/66  09/28/16 128/64  08/30/16 (!) 150/70    She was last seen for hypertension 6 months ago.  BP at that visit was 128/64. Management changes since that visit include no changes. She reports excellent compliance with treatment. She is not having side effects.  She is exercising. She is adherent to low salt diet.   Outside blood pressures are not being checked. She is experiencing none.  Patient denies chest pain, chest pressure/discomfort, claudication, dyspnea, exertional chest pressure/discomfort, fatigue, irregular heart beat, lower extremity edema, near-syncope, orthopnea, palpitations, paroxysmal nocturnal dyspnea and syncope.   Cardiovascular risk factors include  diabetes mellitus, family history of premature cardiovascular disease, hypertension, obesity (BMI >= 30 kg/m2) and sedentary lifestyle.  Use of agents associated with hypertension: none.     Weight trend: stable Wt Readings from Last 3 Encounters:  03/28/17 221 lb 12.8 oz (100.6 kg)  09/28/16 223 lb (101.2 kg)  08/30/16 223 lb 3.2 oz (101.2 kg)    Current diet: in general, a "healthy" diet    ------------------------------------------------------------------------   Lipid/Cholesterol, Follow-up:   Last seen for this6 months ago.  Management changes since that visit include none. . Last Lipid Panel:    Component Value Date/Time   CHOL 167 09/18/2016 0826   TRIG 162 (H) 09/18/2016 0826   HDL 44 09/18/2016 0826   CHOLHDL 3.8 09/18/2016 0826   LDLCALC 91 09/18/2016 0826    Risk factors for vascular disease include diabetes mellitus and hypertension  She reports excellent compliance with treatment. She is not having side effects.  Current symptoms include none and have been improving. Weight trend: stable Prior visit with dietician: no Current diet: in general, a "healthy" diet   Current exercise: aerobics and walking  Wt Readings from Last 3 Encounters:  03/28/17 221 lb 12.8 oz (100.6 kg)  09/28/16 223 lb (101.2 kg)  08/30/16 223 lb 3.2 oz (101.2 kg)    -------------------------------------------------------------------     Allergies  Allergen Reactions  . Ibuprofen     Elevates BP if taken continuously  . Meloxicam Swelling  . Multaq  [Dronedarone]  Current Outpatient Prescriptions:  .  Calcium Carbonate-Vitamin D 600-200 MG-UNIT CAPS, Take 1 tablet by mouth daily., Disp: , Rfl:  .  [facility]S ASPIRIN LOW DOSE 81 MG EC tablet, TAKE 1 TABLET BY MOUTH EVERY DAY, Disp: 90 tablet, Rfl: 3 .  Glucosamine-Chondroitin 500-400 MG CAPS, Take 1 tablet by mouth daily., Disp: , Rfl:  .  lisinopril-hydrochlorothiazide (PRINZIDE,ZESTORETIC) 20-12.5 MG tablet, TAKE 1 TABLET  BY MOUTH DAILY., Disp: 90 tablet, Rfl: 1 .  metoprolol tartrate (LOPRESSOR) 25 MG tablet, Take 12.5 mg by mouth 2 (two) times daily. , Disp: , Rfl:  .  MULTIPLE VITAMIN PO, Take 1 tablet by mouth daily., Disp: , Rfl:  .  omeprazole (PRILOSEC) 20 MG capsule, TAKE 2 CAPSULES BY MOUTH EVERY DAY (Patient taking differently: TAKE 1 CAPSULES BY MOUTH EVERY DAY), Disp: 180 capsule, Rfl: 1 .  simvastatin (ZOCOR) 20 MG tablet, TAKE 1 TABLET BY MOUTH DAILY, Disp: 90 tablet, Rfl: 1  Review of Systems  Constitutional: Negative.   Respiratory: Negative.   Cardiovascular: Negative.   Gastrointestinal: Negative.   Neurological: Negative.     Social History  Substance Use Topics  . Smoking status: Never Smoker  . Smokeless tobacco: Never Used  . Alcohol use No   Objective:   BP 120/66 (BP Location: Left Arm, Patient Position: Sitting, Cuff Size: Large)   Pulse 62   Temp 97.9 F (36.6 C) (Oral)   Resp 20   Ht 5\' 5"  (1.651 m)   Wt 221 lb 12.8 oz (100.6 kg)   SpO2 99%   BMI 36.91 kg/m  Vitals:   03/28/17 0935  BP: 120/66  Pulse: 62  Resp: 20  Temp: 97.9 F (36.6 C)  TempSrc: Oral  SpO2: 99%  Weight: 221 lb 12.8 oz (100.6 kg)  Height: 5\' 5"  (1.651 m)     Physical Exam  Constitutional: She appears well-developed and well-nourished. No distress.  Neck: Normal range of motion. Neck supple. No JVD present. No tracheal deviation present. No thyromegaly present.  Cardiovascular: Normal rate and regular rhythm.  Exam reveals no gallop and no friction rub.   Murmur heard.  Diastolic murmur is present with a grade of 2/6  Pulmonary/Chest: Effort normal and breath sounds normal. No respiratory distress. She has no wheezes. She has no rales.  Lymphadenopathy:    She has no cervical adenopathy.  Skin: She is not diaphoretic.  Vitals reviewed.       Assessment & Plan:     1. Abnormal blood sugar A1c improved to 6.0. Patient is doing weight watchers and has been walking more and doing  some exercise classes. Continue healthy lifestyle modifications. We will recheck with other labs at her [facility] in 6 months.  - POCT glycosylated hemoglobin (Hb A1C)  2. Essential (primary) hypertension Stable and well controlled. Continue lisinopril-hctz 20-12.5mg  and metoprolol 25mg .   3. Pure hypercholesterolemia Stable. Continue Simvastatin 20mg .   4. BMI 36.0-36.9,adult Counseled patient on healthy lifestyle modifications including dieting and exercise. Patient is using weight watchers and exercising 3 days per week.   5. Esophagitis, reflux Decreased dose to one capsule daily since patient is improving.  - omeprazole (PRILOSEC) 20 MG capsule; TAKE 1 CAPSULES BY MOUTH EVERY DAY  Dispense: 90 capsule; Refill: [place], PA-C  [facility]

## 2017-03-28 NOTE — Patient Instructions (Signed)

## 2017-05-07 ENCOUNTER — Other Ambulatory Visit: Payer: Self-pay | Admitting: Physician Assistant

## 2017-05-07 DIAGNOSIS — I10 Essential (primary) hypertension: Secondary | ICD-10-CM

## 2017-05-16 ENCOUNTER — Other Ambulatory Visit: Payer: Self-pay | Admitting: Physician Assistant

## 2017-05-16 DIAGNOSIS — Z1231 Encounter for screening mammogram for malignant neoplasm of breast: Secondary | ICD-10-CM

## 2017-07-09 ENCOUNTER — Ambulatory Visit
Admission: RE | Admit: 2017-07-09 | Discharge: 2017-07-09 | Disposition: A | Discharge status: Home / Self Care | Payer: Medicare Other | Source: Ambulatory Visit | Attending: Physician Assistant | Admitting: Physician Assistant

## 2017-07-09 ENCOUNTER — Telehealth: Payer: Self-pay

## 2017-07-09 DIAGNOSIS — Z1231 Encounter for screening mammogram for malignant neoplasm of breast: Secondary | ICD-10-CM | POA: Insufficient documentation

## 2017-07-09 NOTE — Telephone Encounter (Signed)
-----   Message from Mar Daring, [place] sent at 07/09/2017  1:42 PM EST ----- Normal mammogram. Repeat screening in one year.

## 2017-07-09 NOTE — Telephone Encounter (Signed)
Patient advised as directed below.  Thanks,  -Joseline 

## 2017-09-05 ENCOUNTER — Other Ambulatory Visit: Payer: Self-pay | Admitting: Physician Assistant

## 2017-09-05 DIAGNOSIS — E785 Hyperlipidemia, unspecified: Secondary | ICD-10-CM

## 2017-09-07 ENCOUNTER — Telehealth: Payer: Self-pay | Admitting: Physician Assistant

## 2017-09-07 DIAGNOSIS — E782 Mixed hyperlipidemia: Secondary | ICD-10-CM

## 2017-09-07 DIAGNOSIS — D473 Essential (hemorrhagic) thrombocythemia: Secondary | ICD-10-CM

## 2017-09-07 DIAGNOSIS — E119 Type 2 diabetes mellitus without complications: Secondary | ICD-10-CM

## 2017-09-07 DIAGNOSIS — I10 Essential (primary) hypertension: Secondary | ICD-10-CM

## 2017-09-07 NOTE — Telephone Encounter (Signed)
Labs ordered.

## 2017-09-07 NOTE — Telephone Encounter (Signed)
Patient is wanting her b/w done before her physical, wanted to know if you will put in a order for it

## 2017-09-07 NOTE — Telephone Encounter (Signed)
Pt advised-Stacie Porter, RMA  

## 2017-09-16 ENCOUNTER — Other Ambulatory Visit: Payer: Self-pay | Admitting: Physician Assistant

## 2017-09-21 ENCOUNTER — Telehealth: Payer: Self-pay

## 2017-09-21 LAB — CBC WITH DIFFERENTIAL/PLATELET
BASOS ABS: 0.1 10*3/uL (ref 0.0–0.2)
Basos: 1 %
EOS (ABSOLUTE): 0.3 10*3/uL (ref 0.0–0.4)
Eos: 3 %
Hematocrit: 36 % (ref 34.0–46.6)
Hemoglobin: 12.6 g/dL (ref 11.1–15.9)
IMMATURE GRANS (ABS): 0 10*3/uL (ref 0.0–0.1)
IMMATURE GRANULOCYTES: 0 %
LYMPHS: 20 %
Lymphocytes Absolute: 1.7 10*3/uL (ref 0.7–3.1)
MCH: 32.8 pg (ref 26.6–33.0)
MCHC: 35 g/dL (ref 31.5–35.7)
MCV: 94 fL (ref 79–97)
MONOS ABS: 0.8 10*3/uL (ref 0.1–0.9)
Monocytes: 9 %
NEUTROS PCT: 67 %
Neutrophils Absolute: 5.7 10*3/uL (ref 1.4–7.0)
PLATELETS: 116 10*3/uL — AB (ref 150–379)
RBC: 3.84 x10E6/uL (ref 3.77–5.28)
RDW: 12.9 % (ref 12.3–15.4)
WBC: 8.5 10*3/uL (ref 3.4–10.8)

## 2017-09-21 LAB — COMPREHENSIVE METABOLIC PANEL
ALT: 15 IU/L (ref 0–32)
AST: 18 IU/L (ref 0–40)
Albumin/Globulin Ratio: 1.8 (ref 1.2–2.2)
Albumin: 4.4 g/dL (ref 3.5–4.8)
Alkaline Phosphatase: 62 IU/L (ref 39–117)
BUN/Creatinine Ratio: 24 (ref 12–28)
BUN: 18 mg/dL (ref 8–27)
Bilirubin Total: 0.7 mg/dL (ref 0.0–1.2)
CO2: 24 mmol/L (ref 20–29)
CREATININE: 0.76 mg/dL (ref 0.57–1.00)
Calcium: 9.6 mg/dL (ref 8.7–10.3)
Chloride: 100 mmol/L (ref 96–106)
GFR calc Af Amer: 92 mL/min/{1.73_m2} (ref >59)
GFR, EST NON AFRICAN AMERICAN: 80 mL/min/{1.73_m2} (ref >59)
GLUCOSE: 134 mg/dL — AB (ref 65–99)
Globulin, Total: 2.4 g/dL (ref 1.5–4.5)
POTASSIUM: 4.3 mmol/L (ref 3.5–5.2)
Sodium: 141 mmol/L (ref 134–144)
Total Protein: 6.8 g/dL (ref 6.0–8.5)

## 2017-09-21 LAB — HEMOGLOBIN A1C
Est. average glucose Bld gHb Est-mCnc: 146 mg/dL
Hgb A1c MFr Bld: 6.7 % — ABNORMAL HIGH (ref 4.8–5.6)

## 2017-09-21 LAB — LIPID PANEL
CHOL/HDL RATIO: 3.8 ratio (ref 0.0–4.4)
Cholesterol, Total: 157 mg/dL (ref 100–199)
HDL: 41 mg/dL (ref >39)
LDL Calculated: 81 mg/dL (ref 0–99)
TRIGLYCERIDES: 174 mg/dL — AB (ref 0–149)
VLDL CHOLESTEROL CAL: 35 mg/dL (ref 5–40)

## 2017-09-21 NOTE — Telephone Encounter (Signed)
-----   Message from Mar Daring, PA-C sent at 09/21/2017  8:21 AM EST ----- A1c is up to 6.7 from 6.0. This is the highest your a1c has been since 2016, which is as far back as I can see. I would recommend starting metformin for diabetes to help control your blood sugar better. Also work on lifestyle modifications with exercise and limiting carbs and sugars from diet. All other labs are WNL and stable.

## 2017-09-21 NOTE — Telephone Encounter (Signed)
Patient advised as below. Patient reports she will talk to provider at next office visit.

## 2017-09-28 ENCOUNTER — Ambulatory Visit: Payer: Self-pay | Admitting: Physician Assistant

## 2017-10-01 ENCOUNTER — Ambulatory Visit (INDEPENDENT_AMBULATORY_CARE_PROVIDER_SITE_OTHER): Payer: Medicare Other | Admitting: Physician Assistant

## 2017-10-01 ENCOUNTER — Encounter: Payer: Self-pay | Admitting: Physician Assistant

## 2017-10-01 VITALS — BP 130/70 | HR 67 | Temp 98.0°F | Resp 16 | Ht 65.0 in | Wt 223.6 lb

## 2017-10-01 DIAGNOSIS — E119 Type 2 diabetes mellitus without complications: Secondary | ICD-10-CM

## 2017-10-01 DIAGNOSIS — Z0000 Encounter for general adult medical examination without abnormal findings: Secondary | ICD-10-CM

## 2017-10-01 DIAGNOSIS — I480 Paroxysmal atrial fibrillation: Secondary | ICD-10-CM | POA: Diagnosis not present

## 2017-10-01 DIAGNOSIS — K2100 Gastro-esophageal reflux disease with esophagitis, without bleeding: Secondary | ICD-10-CM

## 2017-10-01 DIAGNOSIS — Z6837 Body mass index (BMI) 37.0-37.9, adult: Secondary | ICD-10-CM

## 2017-10-01 DIAGNOSIS — R05 Cough: Secondary | ICD-10-CM | POA: Diagnosis not present

## 2017-10-01 DIAGNOSIS — R059 Cough, unspecified: Secondary | ICD-10-CM

## 2017-10-01 DIAGNOSIS — I10 Essential (primary) hypertension: Secondary | ICD-10-CM

## 2017-10-01 MED ORDER — OMEPRAZOLE 20 MG PO CPDR
DELAYED_RELEASE_CAPSULE | ORAL | 1 refills | Status: DC
Start: 2017-10-01 — End: 2018-08-07

## 2017-10-01 MED ORDER — BENZONATATE 200 MG PO CAPS: 200.0000 mg | ORAL_CAPSULE | Freq: Three times a day (TID) | ORAL | 0 refills | Status: DC | PRN

## 2017-10-01 NOTE — Progress Notes (Signed)
Patient: Stacie Porter, Female    DOB: 07-27-1946, 72 y.o.   MRN: [id number] Visit Date: 10/01/2017  Today's Provider: Mar Daring, PA-C   Chief Complaint  Patient presents with  . Medicare Wellness   Subjective:    Annual wellness visit Stacie Porter is a 72 y.o. female. She feels well. She reports exercising. She reports she is sleeping well.  09/28/16: CPE 09/28/16-pap-neg hpv-neg 07/09/17: Mammo-BI-RADS 1 10/11/10: Colon-Internal hemorrhoid, recheck in 10 yrs. Flu Vaccine: Reports she had her flu vaccine 06/27/17 at [address]. -----------------------------------------------------------   Review of Systems  Constitutional: Negative.   HENT: Positive for congestion.   Eyes: Negative.   Respiratory: Positive for apnea.   Cardiovascular: Negative.   Gastrointestinal: Negative.   Endocrine: Negative.   Genitourinary: Negative.   Musculoskeletal: Negative.   Skin: Negative.   Allergic/Immunologic: Negative.   Neurological: Negative.   Hematological: Negative.   Psychiatric/Behavioral: Negative.     Social History   Socioeconomic History  . Marital status: Married    Spouse name: Not on file  . Number of children: 3  . Years of education: Not on file  . Highest education level: Not on file  Social Needs  . Financial resource strain: Not on file  . Food insecurity - worry: Not on file  . Food insecurity - inability: Not on file  . Transportation needs - medical: Not on file  . Transportation needs - non-medical: Not on file  Occupational History  . Not on file  Tobacco Use  . Smoking status: Never Smoker  . Smokeless tobacco: Never Used  Substance and Sexual Activity  . Alcohol use: No  . Drug use: No  . Sexual activity: Not on file  Other Topics Concern  . Not on file  Social History Narrative  . Not on file    Past Medical History:  Diagnosis Date  . Hyperlipidemia   . Hypertension      Patient Active Problem List     Diagnosis Date Noted  . 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 MI (mitral incompetence) 07/12/2015  . Paroxysmal atrial fibrillation ([place]) 07/12/2015  . Abdominal pain 05/17/2015  . Strain of tendon of foot and ankle 05/17/2015  . BMI 36.0-36.9,adult 05/17/2015  . Esophagitis, reflux 05/17/2015  . Well controlled diabetes mellitus ([place]) 05/17/2015  . Dizziness 05/17/2015  . Essential (primary) hypertension 05/17/2015  . TI (tricuspid incompetence) 01/11/2015  . H/O malignant neoplasm of skin 02/05/2013  . Essential thrombocythemia ([place]) 10/01/2012  . Blood in the urine 02/24/2010  . History of shingles 09/16/2007    Past Surgical History:  Procedure Laterality Date  . APPENDECTOMY    . cather ablation  2011/2014   cardiac ablation for a fib  . EYE SURGERY Bilateral 05/2014   cataract  . HAMMER TOE SURGERY Right   . HAND SURGERY Bilateral   . LAPAROSCOPIC OOPHERECTOMY    . TUBAL LIGATION      Her family history includes Cancer in her sister and sister; Heart disease in her father and mother; Hypertension in her father; Lung cancer in her sister; Ovarian cancer in her sister; Thyroid cancer in her sister.      Current Outpatient Medications:  .  Calcium Carbonate-Vitamin D 600-200 MG-UNIT CAPS, Take 1 tablet by mouth daily., Disp: , Rfl:  .  [facility] ASPIRIN LOW DOSE 81 MG EC tablet, TAKE 1 TABLET BY MOUTH EVERY DAY, Disp: 90 tablet, Rfl:  3 .  Glucosamine-Chondroitin 500-400 MG CAPS, Take 1 tablet by mouth daily., Disp: , Rfl:  .  lisinopril-hydrochlorothiazide (PRINZIDE,ZESTORETIC) 20-12.5 MG tablet, TAKE 1 TABLET BY MOUTH DAILY., Disp: 90 tablet, Rfl: 1 .  metoprolol tartrate (LOPRESSOR) 25 MG tablet, Take 12.5 mg by mouth 2 (two) times daily. , Disp: , Rfl:  .  MULTIPLE VITAMIN PO, Take 1 tablet by mouth daily., Disp: , Rfl:  .  omeprazole (PRILOSEC) 20 MG capsule, TAKE 1 CAPSULES BY MOUTH EVERY DAY, Disp: 90 capsule, Rfl: 1 .  simvastatin (ZOCOR) 20  MG tablet, TAKE 1 TABLET BY MOUTH DAILY, Disp: 90 tablet, Rfl: 2  Patient Care Team: Mar Daring, PA-C as PCP - General (Family Medicine)     Objective:   Vitals: BP 130/70 (BP Location: Left Arm, Patient Position: Sitting, Cuff Size: Normal)   Pulse 67   Temp 98 F (36.7 C) (Oral)   Resp 16   Ht 5\' 5"  (1.651 m)   Wt 223 lb 9.6 oz (101.4 kg)   SpO2 97%   BMI 37.21 kg/m   Physical Exam  Constitutional: She is oriented to person, place, and time. She appears well-developed and well-nourished. No distress.  HENT:  Head: Normocephalic and atraumatic.  Right Ear: Hearing, tympanic membrane, external ear and ear canal normal.  Left Ear: Hearing, tympanic membrane, external ear and ear canal normal.  Nose: Nose normal.  Mouth/Throat: Uvula is midline, oropharynx is clear and moist and mucous membranes are normal. No oropharyngeal exudate.  Eyes: Conjunctivae and EOM are normal. Pupils are equal, round, and reactive to light. Right eye exhibits no discharge. Left eye exhibits no discharge. No scleral icterus.  Neck: Normal range of motion. Neck supple. No JVD present. No tracheal deviation present. No thyromegaly present.  Cardiovascular: Normal rate, regular rhythm and intact distal pulses. Exam reveals no gallop and no friction rub.  Murmur heard. Pulmonary/Chest: Effort normal and breath sounds normal. No respiratory distress. She has no wheezes. She has no rales. She exhibits no tenderness.  Abdominal: Soft. Bowel sounds are normal. She exhibits no distension and no mass. There is no tenderness. There is no rebound and no guarding.  Musculoskeletal: Normal range of motion. She exhibits no edema or tenderness.  Lymphadenopathy:    She has no cervical adenopathy.  Neurological: She is alert and oriented to person, place, and time.  Skin: Skin is warm and dry. No rash noted. She is not diaphoretic.  Psychiatric: She has a normal mood and affect. Her behavior is normal.  Judgment and thought content normal.  Vitals reviewed.   Activities of Daily Living In your present state of health, do you have any difficulty performing the following activities: 10/01/2017  Hearing? Y  Comment "sometimes"  Vision? N  Difficulty concentrating or making decisions? N  Walking or climbing stairs? N  Dressing or bathing? N  Doing errands, shopping? N  Some recent data might be hidden    Fall Risk Assessment Fall Risk  10/01/2017 09/28/2016 09/27/2015  Falls in the past year? No No No     Depression Screen PHQ 2/9 Scores 10/01/2017 09/28/2016 09/27/2015  PHQ - 2 Score 0 0 0    Cognitive Testing - 6-CIT  Correct? Score   What year is it? yes 0 0 or 4  What month is it? yes 0 0 or 3  Memorize:    Pia Mau,  [address]  [place],      What time is it? (  within 1 hour) yes 0 0 or 3  Count backwards from 20 yes 0 0, 2, or 4  Name the months of the year yes 0 0, 2, or 4  Repeat name & address above yes 0 0, 2, 4, 6, 8, or 10       TOTAL SCORE  0/28   Interpretation:  Normal  Normal (0-7) Abnormal (8-28)   Diabetic Foot Exam - Simple   Simple Foot Form Diabetic Foot exam was performed with the following findings:  Yes 10/01/2017  9:50 AM  Visual Inspection No deformities, no ulcerations, no other skin breakdown bilaterally:  Yes Sensation Testing Intact to touch and monofilament testing bilaterally:  Yes Pulse Check Posterior Tibialis and Dorsalis pulse intact bilaterally:  Yes Comments     Assessment & Plan:     Annual Wellness Visit  Reviewed patient's Family Medical History Reviewed and updated list of patient's medical providers Assessment of cognitive impairment was done Assessed patient's functional ability Established a written schedule for health screening [place] Completed and Reviewed  Exercise Activities and Dietary recommendations Goals    None      Immunization History  Administered Date(s) Administered  .  Influenza,inj,Quad PF,6+ Mos 06/29/2015  . Pneumococcal Conjugate-13 09/23/2014  . Pneumococcal Polysaccharide-23 07/01/2012  . Td 08/28/1997, 03/20/2008  . Tdap 09/23/2014    Health Maintenance  Topic Date Due  . FOOT EXAM  05/17/2016  . INFLUENZA VACCINE  03/28/2017  . OPHTHALMOLOGY EXAM  03/28/2017  . HEMOGLOBIN A1C  03/20/2018  . MAMMOGRAM  07/10/2019  . COLONOSCOPY  10/11/2020  . TETANUS/TDAP  09/23/2024  . DEXA SCAN  Completed  . Hepatitis C Screening  Completed  . PNA vac Low Risk Adult  Completed     Discussed health benefits of physical activity, and encouraged her to engage in regular exercise appropriate for her age and condition.    1. Medicare annual wellness visit, subsequent Normal physical exam today. Discussed Shingrix vaccine but she will call to see where to have done at.   2. Esophagitis, reflux Stable. Diagnosis pulled for medication refill. Continue current medical treatment plan. - omeprazole (PRILOSEC) 20 MG capsule; TAKE 1 CAPSULES BY MOUTH EVERY DAY  Dispense: 90 capsule; Refill: 1  3. Essential (primary) hypertension Recently had increased lisinopril to 40mg , continue HCTZ 25mg , continue metoprolol 12.5mg  BID. Followed by cardiology.  4. Cough Acute URI with cough. Tessalon perles sent. May use Coricidin HBP if needed for congestion. Also discussed Mucinex for congestion.  - benzonatate (TESSALON) 200 MG capsule; Take 1 capsule (200 mg total) by mouth 3 (three) times daily as needed for cough.  Dispense: 30 capsule; Refill: 0  5. Well controlled diabetes mellitus ([place]) Had recent increase in her A1c from 6.0 to 6.7. Reports she had recently started eating a snack bar from [employer] that when she reviewed the nutrition content after learning her sugar was up that had 22 gram carbs. She has since quit eating these. Declines wanting to start medication at this time. I will see her back in 6 months to recheck.   6. Paroxysmal atrial fibrillation  ([place]) Followed by Cardiology  7. BMI 37.0-37.9, adult Using weight wat[employer].  ------------------------------------------------------------------------------------------------------------    Mar Daring, PA-C  [facility]

## 2017-10-01 NOTE — Patient Instructions (Signed)
Health Maintenance for Postmenopausal Women Menopause is a normal process in which your reproductive ability comes to an end. This process happens gradually over a span of months to years, usually between the ages of 22 and 9. Menopause is complete when you have missed 12 consecutive menstrual periods. It is important to talk with your health care provider about some of the most common conditions that affect postmenopausal women, such as heart disease, cancer, and bone loss (osteoporosis). Adopting a healthy lifestyle and getting preventive care can help to promote your health and wellness. Those actions can also lower your chances of developing some of these common conditions. What should I know about menopause? During menopause, you may experience a number of symptoms, such as:  Moderate-to-severe hot flashes.  Night sweats.  Decrease in sex drive.  Mood swings.  Headaches.  Tiredness.  Irritability.  Memory problems.  Insomnia.  Choosing to treat or not to treat menopausal changes is an individual decision that you make with your health care provider. What should I know about hormone replacement therapy and supplements? Hormone therapy products are effective for treating symptoms that are associated with menopause, such as hot flashes and night sweats. Hormone replacement carries certain risks, especially as you become older. If you are thinking about using estrogen or estrogen with progestin treatments, discuss the benefits and risks with your health care provider. What should I know about heart disease and stroke? Heart disease, heart attack, and stroke become more likely as you age. This may be due, in part, to the hormonal changes that your body experiences during menopause. These can affect how your body processes dietary fats, triglycerides, and cholesterol. Heart attack and stroke are both medical emergencies. There are many things that you can do to help prevent heart disease  and stroke:  Have your blood pressure checked at least every 1-2 years. High blood pressure causes heart disease and increases the risk of stroke.  If you are 71-22 years old, ask your health care provider if you should take aspirin to prevent a heart attack or a stroke.  Do not use any tobacco products, including cigarettes, chewing tobacco, or electronic cigarettes. If you need help quitting, ask your health care provider.  It is important to eat a healthy diet and maintain a healthy weight. ? Be sure to include plenty of vegetables, fruits, low-fat dairy products, and lean protein. ? Avoid eating foods that are high in solid fats, added sugars, or salt (sodium).  Get regular exercise. This is one of the most important things that you can do for your health. ? Try to exercise for at least 150 minutes each week. The type of exercise that you do should increase your heart rate and make you sweat. This is known as moderate-intensity exercise. ? Try to do strengthening exercises at least twice each week. Do these in addition to the moderate-intensity exercise.  Know your numbers.Ask your health care provider to check your cholesterol and your blood glucose. Continue to have your blood tested as directed by your health care provider.  What should I know about cancer screening? There are several types of cancer. Take the following steps to reduce your risk and to catch any cancer development as early as possible. Breast Cancer  Practice breast self-awareness. ? This means understanding how your breasts normally appear and feel. ? It also means doing regular breast self-exams. Let your health care provider know about any changes, no matter how small.  If you are 71  or older, have a clinician do a breast exam (clinical breast exam or CBE) every year. Depending on your age, family history, and medical history, it may be recommended that you also have a yearly breast X-ray (mammogram).  If you  have a family history of breast cancer, talk with your health care provider about genetic screening.  If you are at high risk for breast cancer, talk with your health care provider about having an MRI and a mammogram every year.  Breast cancer (BRCA) gene test is recommended for women who have family members with BRCA-related cancers. Results of the assessment will determine the need for genetic counseling and BRCA1 and for BRCA2 testing. BRCA-related cancers include these types: ? Breast. This occurs in males or females. ? Ovarian. ? Tubal. This may also be called fallopian tube cancer. ? Cancer of the abdominal or pelvic lining (peritoneal cancer). ? Prostate. ? Pancreatic.  Cervical, Uterine, and Ovarian Cancer Your health care provider may recommend that you be screened regularly for cancer of the pelvic organs. These include your ovaries, uterus, and vagina. This screening involves a pelvic exam, which includes checking for microscopic changes to the surface of your cervix (Pap test).  For women ages 21-65, health care providers may recommend a pelvic exam and a Pap test every three years. For women ages 71-65, they may recommend the Pap test and pelvic exam, combined with testing for human papilloma virus (HPV), every five years. Some types of HPV increase your risk of cervical cancer. Testing for HPV may also be done on women of any age who have unclear Pap test results.  Other health care providers may not recommend any screening for nonpregnant women who are considered low risk for pelvic cancer and have no symptoms. Ask your health care provider if a screening pelvic exam is right for you.  If you have had past treatment for cervical cancer or a condition that could lead to cancer, you need Pap tests and screening for cancer for at least 20 years after your treatment. If Pap tests have been discontinued for you, your risk factors (such as having a new sexual partner) need to be  reassessed to determine if you should start having screenings again. Some women have medical problems that increase the chance of getting cervical cancer. In these cases, your health care provider may recommend that you have screening and Pap tests more often.  If you have a family history of uterine cancer or ovarian cancer, talk with your health care provider about genetic screening.  If you have vaginal bleeding after reaching menopause, tell your health care provider.  There are currently no reliable tests available to screen for ovarian cancer.  Lung Cancer Lung cancer screening is recommended for adults 69-62 years old who are at high risk for lung cancer because of a history of smoking. A yearly low-dose CT scan of the lungs is recommended if you:  Currently smoke.  Have a history of at least 30 pack-years of smoking and you currently smoke or have quit within the past 15 years. A pack-year is smoking an average of one pack of cigarettes per day for one year.  Yearly screening should:  Continue until it has been 15 years since you quit.  Stop if you develop a health problem that would prevent you from having lung cancer treatment.  Colorectal Cancer  This type of cancer can be detected and can often be prevented.  Routine colorectal cancer screening usually begins at  age 42 and continues through age 45.  If you have risk factors for colon cancer, your health care provider may recommend that you be screened at an earlier age.  If you have a family history of colorectal cancer, talk with your health care provider about genetic screening.  Your health care provider may also recommend using home test kits to check for hidden blood in your stool.  A small camera at the end of a tube can be used to examine your colon directly (sigmoidoscopy or colonoscopy). This is done to check for the earliest forms of colorectal cancer.  Direct examination of the colon should be repeated every  5-10 years until age 71. However, if early forms of precancerous polyps or small growths are found or if you have a family history or genetic risk for colorectal cancer, you may need to be screened more often.  Skin Cancer  Check your skin from head to toe regularly.  Monitor any moles. Be sure to tell your health care provider: ? About any new moles or changes in moles, especially if there is a change in a mole's shape or color. ? If you have a mole that is larger than the size of a pencil eraser.  If any of your family members has a history of skin cancer, especially at a young age, talk with your health care provider about genetic screening.  Always use sunscreen. Apply sunscreen liberally and repeatedly throughout the day.  Whenever you are outside, protect yourself by wearing long sleeves, pants, a wide-brimmed hat, and sunglasses.  What should I know about osteoporosis? Osteoporosis is a condition in which bone destruction happens more quickly than new bone creation. After menopause, you may be at an increased risk for osteoporosis. To help prevent osteoporosis or the bone fractures that can happen because of osteoporosis, the following is recommended:  If you are 46-71 years old, get at least 1,000 mg of calcium and at least 600 mg of vitamin D per day.  If you are older than age 55 but younger than age 65, get at least 1,200 mg of calcium and at least 600 mg of vitamin D per day.  If you are older than age 54, get at least 1,200 mg of calcium and at least 800 mg of vitamin D per day.  Smoking and excessive alcohol intake increase the risk of osteoporosis. Eat foods that are rich in calcium and vitamin D, and do weight-bearing exercises several times each week as directed by your health care provider. What should I know about how menopause affects my mental health? Depression may occur at any age, but it is more common as you become older. Common symptoms of depression  include:  Low or sad mood.  Changes in sleep patterns.  Changes in appetite or eating patterns.  Feeling an overall lack of motivation or enjoyment of activities that you previously enjoyed.  Frequent crying spells.  Talk with your health care provider if you think that you are experiencing depression. What should I know about immunizations? It is important that you get and maintain your immunizations. These include:  Tetanus, diphtheria, and pertussis (Tdap) booster vaccine.  Influenza every year before the flu season begins.  Pneumonia vaccine.  Shingles vaccine.  Your health care provider may also recommend other immunizations. This information is not intended to replace advice given to you by your health care provider. Make sure you discuss any questions you have with your health care provider. Document Released: 10/06/2005  Document Revised: 03/03/2016 Document Reviewed: 05/18/2015 [employer]vier Interactive Patient Education  2018 [employer]  

## 2018-01-23 ENCOUNTER — Encounter: Payer: Self-pay | Admitting: Physician Assistant

## 2018-01-23 ENCOUNTER — Ambulatory Visit (INDEPENDENT_AMBULATORY_CARE_PROVIDER_SITE_OTHER): Payer: Medicare Other | Admitting: Physician Assistant

## 2018-01-23 VITALS — BP 120/70 | HR 60 | Temp 98.3°F | Resp 16 | Ht 65.0 in | Wt 229.0 lb

## 2018-01-23 DIAGNOSIS — Z6838 Body mass index (BMI) 38.0-38.9, adult: Secondary | ICD-10-CM

## 2018-01-23 DIAGNOSIS — E119 Type 2 diabetes mellitus without complications: Secondary | ICD-10-CM

## 2018-01-23 LAB — POCT UA - MICROALBUMIN: Microalbumin Ur, POC: 20 mg/L

## 2018-01-23 LAB — POCT GLYCOSYLATED HEMOGLOBIN (HGB A1C)
ESTIMATED AVERAGE GLUCOSE: 146
HEMOGLOBIN A1C: 6.7 % — AB (ref 4.0–5.6)

## 2018-01-23 NOTE — Progress Notes (Signed)
Patient: Stacie Porter Female    DOB: 06-Feb-1946   72 y.o.   MRN: [id number] Visit Date: 01/23/2018  Today's Provider: Mar Daring, PA-C   Chief Complaint  Patient presents with  . Diabetes   Subjective:    HPI  Diabetes Mellitus Type II, Follow-up:   Lab Results  Component Value Date   HGBA1C 6.7 (A) 01/23/2018   HGBA1C 6.7 (H) 09/20/2017   HGBA1C 6.0 03/28/2017    Last seen for diabetes 4 months ago.  Management since then includes no changes. She reports excellent compliance with treatment. She is not having side effects.  Current symptoms include none and have been stable. Home blood sugar records: does not check her blood sugar  Episodes of hypoglycemia? no   Current Insulin Regimen: none Most Recent Eye Exam: Dr. Gloriann Loan 02/06/18 Weight trend: increasing steadily Prior visit with dietician: yes - is weight watchers Current diet: in general, a "healthy" diet   Current exercise: aerobics and walking  Pertinent Labs:    Component Value Date/Time   CHOL 157 09/20/2017 0810   TRIG 174 (H) 09/20/2017 0810   HDL 41 09/20/2017 0810   LDLCALC 81 09/20/2017 0810   CREATININE 0.76 09/20/2017 0810   CREATININE 0.65 12/03/2014 0054    Wt Readings from Last 3 Encounters:  01/23/18 229 lb (103.9 kg)  10/01/17 223 lb 9.6 oz (101.4 kg)  03/28/17 221 lb 12.8 oz (100.6 kg)   ------------------------------------------------------------------------    Allergies  Allergen Reactions  . Ibuprofen     Elevates BP if taken continuously  . Meloxicam Swelling  . Multaq  [Dronedarone]      Current Outpatient Medications:  .  Calcium Carbonate-Vitamin D 600-200 MG-UNIT CAPS, Take 1 tablet by mouth daily., Disp: , Rfl:  .  [facility] ASPIRIN LOW DOSE 81 MG EC tablet, TAKE 1 TABLET BY MOUTH EVERY DAY, Disp: 90 tablet, Rfl: 3 .  Glucosamine-Chondroitin 500-400 MG CAPS, Take 1 tablet by mouth daily., Disp: , Rfl:  .  hydrochlorothiazide (HYDRODIURIL) 25 MG tablet, Take  by mouth., Disp: , Rfl:  .  lisinopril (PRINIVIL,ZESTRIL) 40 MG tablet, Take by mouth., Disp: , Rfl:  .  metoprolol tartrate (LOPRESSOR) 25 MG tablet, Take 12.5 mg by mouth 2 (two) times daily. , Disp: , Rfl:  .  MULTIPLE VITAMIN PO, Take 1 tablet by mouth daily., Disp: , Rfl:  .  omeprazole (PRILOSEC) 20 MG capsule, TAKE 1 CAPSULES BY MOUTH EVERY DAY, Disp: 90 capsule, Rfl: 1 .  simvastatin (ZOCOR) 20 MG tablet, TAKE 1 TABLET BY MOUTH DAILY, Disp: 90 tablet, Rfl: 2  Review of Systems  Constitutional: Positive for unexpected weight change.  Respiratory: Negative.   Cardiovascular: Negative.     Social History   Tobacco Use  . Smoking status: Never Smoker  . Smokeless tobacco: Never Used  Substance Use Topics  . Alcohol use: No   Objective:   BP 120/70 (BP Location: Left Arm, Patient Position: Sitting, Cuff Size: Large)   Pulse 60   Temp 98.3 F (36.8 C) (Oral)   Resp 16   Ht 5\' 5"  (1.651 m)   Wt 229 lb (103.9 kg)   SpO2 98%   BMI 38.11 kg/m  Vitals:   01/23/18 0846  BP: 120/70  Pulse: 60  Resp: 16  Temp: 98.3 F (36.8 C)  TempSrc: Oral  SpO2: 98%  Weight: 229 lb (103.9 kg)  Height: 5\' 5"  (1.651 m)     Physical  Exam  Constitutional: She appears well-developed and well-nourished. No distress.  Neck: Normal range of motion. Neck supple. No JVD present. No tracheal deviation present. No thyromegaly present.  Cardiovascular: Normal rate, regular rhythm and normal heart sounds. Exam reveals no gallop and no friction rub.  No murmur heard. Pulmonary/Chest: Effort normal and breath sounds normal. No respiratory distress. She has no wheezes. She has no rales.  Lymphadenopathy:    She has no cervical adenopathy.  Skin: She is not diaphoretic.  Vitals reviewed.      Assessment & Plan:     1. Well controlled diabetes mellitus ([place]) Microalbumin normal. A1c stable at 6.7.  - POCT glycosylated hemoglobin (Hb A1C) - POCT UA - Microalbumin  2. BMI  38.0-38.9,adult Counseled patient on healthy lifestyle modifications including dieting and exercise.        Mar Daring, PA-C  [facility]

## 2018-01-28 ENCOUNTER — Encounter: Payer: Self-pay | Admitting: Physician Assistant

## 2018-04-03 ENCOUNTER — Ambulatory Visit (INDEPENDENT_AMBULATORY_CARE_PROVIDER_SITE_OTHER): Payer: Medicare Other | Admitting: Physician Assistant

## 2018-04-03 ENCOUNTER — Encounter: Payer: Self-pay | Admitting: Physician Assistant

## 2018-04-03 VITALS — BP 130/70 | HR 63 | Temp 98.0°F | Resp 16 | Wt 227.6 lb

## 2018-04-03 DIAGNOSIS — I10 Essential (primary) hypertension: Secondary | ICD-10-CM

## 2018-04-03 NOTE — Progress Notes (Signed)
       Patient: Stacie Porter Female    DOB: Jan 16, 1946   72 y.o.   MRN: [id number] Visit Date: 04/03/2018  Today's Provider: Mar Daring, PA-C   Chief Complaint  Patient presents with  . Follow-up    Diabetes   Subjective:    HPI Patient here today for Type 2 Diabetes Mellitus follow-up.Patient was here 2 months ago and no changes made at that visit.  Lab Results  Component Value Date   HGBA1C 6.7 (A) 01/23/2018   HGBA1C 6.7 (H) 09/20/2017   HGBA1C 6.0 03/28/2017    Hypertension: Stable. Patient reports no symptoms. She reports eating healthy and exercising.She is taking Lisinopril 40 mg, HCTZ 25mg , and Metoprolol12.5mg  BID. She is followed by Cardiology.     Allergies  Allergen Reactions  . Ibuprofen     Elevates BP if taken continuously  . Meloxicam Swelling  . Multaq  [Dronedarone]      Current Outpatient Medications:  .  Calcium Carbonate-Vitamin D 600-200 MG-UNIT CAPS, Take 1 tablet by mouth daily., Disp: , Rfl:  .  [employer]S ASPIRIN LOW DOSE 81 MG EC tablet, TAKE 1 TABLET BY MOUTH EVERY DAY, Disp: 90 tablet, Rfl: 3 .  Glucosamine-Chondroitin 500-400 MG CAPS, Take 1 tablet by mouth daily., Disp: , Rfl:  .  hydrochlorothiazide (HYDRODIURIL) 25 MG tablet, Take by mouth., Disp: , Rfl:  .  lisinopril (PRINIVIL,ZESTRIL) 40 MG tablet, Take by mouth., Disp: , Rfl:  .  metoprolol tartrate (LOPRESSOR) 25 MG tablet, Take 12.5 mg by mouth 2 (two) times daily. , Disp: , Rfl:  .  MULTIPLE VITAMIN PO, Take 1 tablet by mouth daily., Disp: , Rfl:  .  omeprazole (PRILOSEC) 20 MG capsule, TAKE 1 CAPSULES BY MOUTH EVERY DAY, Disp: 90 capsule, Rfl: 1 .  simvastatin (ZOCOR) 20 MG tablet, TAKE 1 TABLET BY MOUTH DAILY, Disp: 90 tablet, Rfl: 2  Review of Systems  Constitutional: Negative.   Respiratory: Negative.   Cardiovascular: Negative.   Neurological: Negative.     Social History   Tobacco Use  . Smoking status: Never Smoker  . Smokeless tobacco: Never Used  Substance  Use Topics  . Alcohol use: No   Objective:   BP 130/70 (BP Location: Left Arm, Patient Position: Sitting, Cuff Size: Normal)   Pulse 63   Temp 98 F (36.7 C) (Oral)   Resp 16   Wt 227 lb 9.6 oz (103.2 kg)   SpO2 97%   BMI 37.87 kg/m  Vitals:   04/03/18 0812  BP: 130/70  Pulse: 63  Resp: 16  Temp: 98 F (36.7 C)  TempSrc: Oral  SpO2: 97%  Weight: 227 lb 9.6 oz (103.2 kg)     Physical Exam  Constitutional: She appears well-developed and well-nourished. No distress.  Vitals reviewed.       Assessment & Plan:     1. Essential (primary) hypertension Stable. Continue lisniopril 40mg , metoprolol 25mg . Patient is one month too early for T2DM check and has a CPE upcoming. Being that patient is doing well and was just seen 2 months ago I will no charge this visit as it should have been cancelled from last time.        Mar Daring, PA-C  [facility]

## 2018-05-26 ENCOUNTER — Other Ambulatory Visit: Payer: Self-pay | Admitting: Physician Assistant

## 2018-05-26 DIAGNOSIS — E785 Hyperlipidemia, unspecified: Secondary | ICD-10-CM

## 2018-06-13 ENCOUNTER — Other Ambulatory Visit: Payer: Self-pay | Admitting: Physician Assistant

## 2018-06-13 DIAGNOSIS — Z1231 Encounter for screening mammogram for malignant neoplasm of breast: Secondary | ICD-10-CM

## 2018-07-15 ENCOUNTER — Telehealth: Payer: Self-pay | Admitting: Physician Assistant

## 2018-07-15 DIAGNOSIS — R05 Cough: Secondary | ICD-10-CM

## 2018-07-15 DIAGNOSIS — R059 Cough, unspecified: Secondary | ICD-10-CM

## 2018-07-15 MED ORDER — BENZONATATE 200 MG PO CAPS: 200.0000 mg | ORAL_CAPSULE | Freq: Two times a day (BID) | ORAL | 0 refills | Status: DC | PRN

## 2018-07-15 NOTE — Telephone Encounter (Signed)
Mucinex DM is safe for her to use.  I can refill tessalon as well.   She will need to be seen if symptoms worsen or fail to improve over 7-10 days.

## 2018-07-15 NOTE — Telephone Encounter (Signed)
Please advise 

## 2018-07-15 NOTE — Telephone Encounter (Signed)
Nose is stopped up - coughing - no fever.  Coricidin is not working for pt  Asking if she can use the Mucinex DM  / Asking if the tessalon perles can be called in as well.  Please advise.  Thanks, [employer]

## 2018-07-15 NOTE — Telephone Encounter (Signed)
Patient advised as directed below. 

## 2018-08-05 ENCOUNTER — Telehealth: Payer: Self-pay

## 2018-08-05 ENCOUNTER — Ambulatory Visit
Admission: RE | Admit: 2018-08-05 | Discharge: 2018-08-05 | Disposition: A | Discharge status: Home / Self Care | Payer: Medicare Other | Source: Ambulatory Visit | Attending: Physician Assistant | Admitting: Physician Assistant

## 2018-08-05 DIAGNOSIS — Z1231 Encounter for screening mammogram for malignant neoplasm of breast: Secondary | ICD-10-CM | POA: Insufficient documentation

## 2018-08-05 NOTE — Telephone Encounter (Signed)
-----   Message from Mar Daring, [place] sent at 08/05/2018  4:02 PM EST ----- Normal mammogram. Repeat screening in one year.

## 2018-08-05 NOTE — Telephone Encounter (Signed)
Patient was advised.  

## 2018-08-07 ENCOUNTER — Other Ambulatory Visit: Payer: Self-pay | Admitting: Physician Assistant

## 2018-08-07 DIAGNOSIS — K2100 Gastro-esophageal reflux disease with esophagitis, without bleeding: Secondary | ICD-10-CM

## 2018-09-05 ENCOUNTER — Telehealth: Payer: Self-pay

## 2018-09-05 NOTE — Telephone Encounter (Signed)
Pt only wanted to schedule AWV prior to CPE. Advised pt to come in at 8:20 AM on 10/04/17 for her AWV. Pt stated understanding.  -MM

## 2018-09-05 NOTE — Telephone Encounter (Signed)
LMTCB and schedule AWV prior to CPE on 10/04/18. Only eligible to be scheduled for an AWV 10/02/18 or later.  -MM

## 2018-09-13 ENCOUNTER — Other Ambulatory Visit: Payer: Self-pay | Admitting: Physician Assistant

## 2018-09-17 ENCOUNTER — Telehealth: Payer: Self-pay | Admitting: Physician Assistant

## 2018-09-17 DIAGNOSIS — E782 Mixed hyperlipidemia: Secondary | ICD-10-CM

## 2018-09-17 DIAGNOSIS — D473 Essential (hemorrhagic) thrombocythemia: Secondary | ICD-10-CM

## 2018-09-17 DIAGNOSIS — Z6837 Body mass index (BMI) 37.0-37.9, adult: Secondary | ICD-10-CM

## 2018-09-17 DIAGNOSIS — E119 Type 2 diabetes mellitus without complications: Secondary | ICD-10-CM

## 2018-09-17 DIAGNOSIS — I10 Essential (primary) hypertension: Secondary | ICD-10-CM

## 2018-09-17 NOTE — Telephone Encounter (Signed)
Patient states that she has a appointment on 10/04/2018 and is wanting to know if she can have her lab work done for this appointment on 09/23/2018 so you will have the results at her appointment.  Please advise.

## 2018-09-17 NOTE — Telephone Encounter (Signed)
Please advise 

## 2018-09-20 ENCOUNTER — Ambulatory Visit (INDEPENDENT_AMBULATORY_CARE_PROVIDER_SITE_OTHER): Payer: Medicare Other | Admitting: Physician Assistant

## 2018-09-20 ENCOUNTER — Encounter: Payer: Self-pay | Admitting: Physician Assistant

## 2018-09-20 VITALS — BP 138/65 | HR 62 | Temp 98.2°F | Resp 16

## 2018-09-20 DIAGNOSIS — N3000 Acute cystitis without hematuria: Secondary | ICD-10-CM | POA: Diagnosis not present

## 2018-09-20 DIAGNOSIS — R300 Dysuria: Secondary | ICD-10-CM | POA: Diagnosis not present

## 2018-09-20 LAB — POCT URINALYSIS DIPSTICK
Appearance: ABNORMAL
Bilirubin, UA: NEGATIVE
Glucose, UA: NEGATIVE
Ketones, UA: NEGATIVE
Nitrite, UA: NEGATIVE
Odor: NORMAL
Protein, UA: NEGATIVE
Spec Grav, UA: 1.01 (ref 1.010–1.025)
Urobilinogen, UA: 0.2 E.U./dL
pH, UA: 7 (ref 5.0–8.0)

## 2018-09-20 MED ORDER — SULFAMETHOXAZOLE-TRIMETHOPRIM 800-160 MG PO TABS: 1.0000 | ORAL_TABLET | Freq: Two times a day (BID) | ORAL | 0 refills | Status: DC

## 2018-09-20 NOTE — Telephone Encounter (Signed)
Patient was advised.  

## 2018-09-20 NOTE — Telephone Encounter (Signed)
Labs ordered for her and printed.   She can come Monday to have drawn.

## 2018-09-20 NOTE — Patient Instructions (Signed)
Urinary Tract Infection, Adult A urinary tract infection (UTI) is an infection of any part of the urinary tract. The urinary tract includes:  The kidneys.  The ureters.  The bladder.  The urethra. These organs make, store, and get rid of pee (urine) in the body. What are the causes? This is caused by germs (bacteria) in your genital area. These germs grow and cause swelling (inflammation) of your urinary tract. What increases the risk? You are more likely to develop this condition if:  You have a small, thin tube (catheter) to drain pee.  You cannot control when you pee or poop (incontinence).  You are female, and: ? You use these methods to prevent pregnancy: ? A medicine that kills sperm (spermicide). ? A device that blocks sperm (diaphragm). ? You have low levels of a female hormone (estrogen). ? You are pregnant.  You have genes that add to your risk.  You are sexually active.  You take antibiotic medicines.  You have trouble peeing because of: ? A prostate that is bigger than normal, if you are female. ? A blockage in the part of your body that drains pee from the bladder (urethra). ? A kidney stone. ? A nerve condition that affects your bladder (neurogenic bladder). ? Not getting enough to drink. ? Not peeing often enough.  You have other conditions, such as: ? Diabetes. ? A weak disease-fighting system (immune system). ? Sickle cell disease. ? Gout. ? Injury of the spine. What are the signs or symptoms? Symptoms of this condition include:  Needing to pee right away (urgently).  Peeing often.  Peeing small amounts often.  Pain or burning when peeing.  Blood in the pee.  Pee that smells bad or not like normal.  Trouble peeing.  Pee that is cloudy.  Fluid coming from the vagina, if you are female.  Pain in the belly or lower back. Other symptoms include:  Throwing up (vomiting).  No urge to eat.  Feeling mixed up (confused).  Being tired  and grouchy (irritable).  A fever.  Watery poop (diarrhea). How is this treated? This condition may be treated with:  Antibiotic medicine.  Other medicines.  Drinking enough water. Follow these instructions at home:  Medicines  Take over-the-counter and prescription medicines only as told by your doctor.  If you were prescribed an antibiotic medicine, take it as told by your doctor. Do not stop taking it even if you start to feel better. General instructions  Make sure you: ? Pee until your bladder is empty. ? Do not hold pee for a long time. ? Empty your bladder after sex. ? Wipe from front to back after pooping if you are a female. Use each tissue one time when you wipe.  Drink enough fluid to keep your pee pale yellow.  Keep all follow-up visits as told by your doctor. This is important. Contact a doctor if:  You do not get better after 1-2 days.  Your symptoms go away and then come back. Get help right away if:  You have very bad back pain.  You have very bad pain in your lower belly.  You have a fever.  You are sick to your stomach (nauseous).  You are throwing up. Summary  A urinary tract infection (UTI) is an infection of any part of the urinary tract.  This condition is caused by germs in your genital area.  There are many risk factors for a UTI. These include having a small, thin   tube to drain pee and not being able to control when you pee or poop.  Treatment includes antibiotic medicines for germs.  Drink enough fluid to keep your pee pale yellow. This information is not intended to replace advice given to you by your health care provider. Make sure you discuss any questions you have with your health care provider. Document Released: 01/31/2008 Document Revised: 02/21/2018 Document Reviewed: 02/21/2018 Elsevier Interactive Patient Education  2019 Elsevier Inc.  

## 2018-09-20 NOTE — Progress Notes (Signed)
Patient: Stacie Porter Female    DOB: 12-01-1945   73 y.o.   MRN: [id number] Visit Date: 09/20/2018  Today's Provider: Mar Daring, PA-C   Chief Complaint  Patient presents with  . Dysuria   Subjective:     HPI  Patient states she has had burning upon urination since yesterday. Patient states she was taking Ceftin and believes her symptoms are a side effect for that. Patient also has symptoms of urgency and frequency. Patient states she has been taking AZO for burning. Patient did just finish Ceftin 2 days ago for an ulcer on her foot that is being followed by Podiatry. Denies vaginal itching or vaginal discharge.   Allergies  Allergen Reactions  . Ibuprofen     Elevates BP if taken continuously  . Meloxicam Swelling  . Multaq  [Dronedarone]      Current Outpatient Medications:  .  aspirin 81 MG EC tablet, TAKE 1 TABLET BY MOUTH EVERY DAY, Disp: 90 tablet, Rfl: 3 .  Calcium Carbonate-Vitamin D 600-200 MG-UNIT CAPS, Take 1 tablet by mouth daily., Disp: , Rfl:  .  Glucosamine-Chondroitin 500-400 MG CAPS, Take 1 tablet by mouth daily., Disp: , Rfl:  .  metoprolol tartrate (LOPRESSOR) 25 MG tablet, Take 12.5 mg by mouth 2 (two) times daily. , Disp: , Rfl:  .  MULTIPLE VITAMIN PO, Take 1 tablet by mouth daily., Disp: , Rfl:  .  omeprazole (PRILOSEC) 20 MG capsule, TAKE 1 CAPSULES BY MOUTH EVERY DAY, Disp: 90 capsule, Rfl: 1 .  simvastatin (ZOCOR) 20 MG tablet, TAKE 1 TABLET BY MOUTH DAILY, Disp: 90 tablet, Rfl: 2 .  benzonatate (TESSALON) 200 MG capsule, Take 1 capsule (200 mg total) by mouth 2 (two) times daily as needed for cough. (Patient not taking: Reported on 09/20/2018), Disp: 20 capsule, Rfl: 0 .  hydrochlorothiazide (HYDRODIURIL) 25 MG tablet, Take by mouth., Disp: , Rfl:  .  lisinopril (PRINIVIL,ZESTRIL) 40 MG tablet, Take by mouth., Disp: , Rfl:   Review of Systems  Constitutional: Negative for appetite change, chills, fatigue and fever.  Respiratory:  Negative for chest tightness and shortness of breath.   Cardiovascular: Negative for chest pain and palpitations.  Gastrointestinal: Negative for abdominal pain, nausea and vomiting.  Genitourinary: Positive for dysuria, frequency and urgency.  Neurological: Negative for dizziness and weakness.    Social History   Tobacco Use  . Smoking status: Never Smoker  . Smokeless tobacco: Never Used  Substance Use Topics  . Alcohol use: No      Objective:   BP 138/65 (BP Location: Left Arm, Patient Position: Sitting, Cuff Size: Large)   Pulse 62   Temp 98.2 F (36.8 C) (Oral)   Resp 16   SpO2 95%  Vitals:   09/20/18 1143  BP: 138/65  Pulse: 62  Resp: 16  Temp: 98.2 F (36.8 C)  TempSrc: Oral  SpO2: 95%     Physical Exam Vitals signs reviewed.  Constitutional:      General: She is not in acute distress.    Appearance: Normal appearance. She is well-developed. She is not diaphoretic.  Cardiovascular:     Rate and Rhythm: Normal rate and regular rhythm.     Heart sounds: Normal heart sounds. No murmur. No friction rub. No gallop.   Pulmonary:     Effort: Pulmonary effort is normal. No respiratory distress.     Breath sounds: Normal breath sounds. No wheezing or rales.  Abdominal:  General: Bowel sounds are normal. There is no distension.     Palpations: Abdomen is soft. There is no mass.     Tenderness: There is abdominal tenderness in the suprapubic area. There is no guarding or rebound.  Skin:    General: Skin is warm and dry.  Neurological:     Mental Status: She is alert and oriented to person, place, and time.         Assessment & Plan    1. Dysuria UA positive today. Suspect UTI, see below. If culture is negative, however, will treat for yeast infection secondary to ceftin.  - POCT urinalysis dipstick - CULTURE, URINE COMPREHENSIVE  2. Acute cystitis without hematuria Worsening symptoms. UA positive. Will treat empirically with Bactrim as below.  Continue to push fluids. Urine sent for culture. Will follow up pending C&S results. She is to call if symptoms do not improve or if they worsen.  - sulfamethoxazole-trimethoprim (BACTRIM DS,SEPTRA DS) 800-160 MG tablet; Take 1 tablet by mouth 2 (two) times daily.  Dispense: 10 tablet; Refill: 0     Mar Daring, PA-C  [facility]

## 2018-09-24 LAB — CBC WITH DIFFERENTIAL/PLATELET
BASOS: 1 %
Basophils Absolute: 0.1 10*3/uL (ref 0.0–0.2)
EOS (ABSOLUTE): 0.1 10*3/uL (ref 0.0–0.4)
Eos: 1 %
Hematocrit: 34.2 % (ref 34.0–46.6)
Hemoglobin: 12 g/dL (ref 11.1–15.9)
Immature Grans (Abs): 0 10*3/uL (ref 0.0–0.1)
Immature Granulocytes: 1 %
Lymphocytes Absolute: 1.5 10*3/uL (ref 0.7–3.1)
Lymphs: 26 %
MCH: 33.1 pg — ABNORMAL HIGH (ref 26.6–33.0)
MCHC: 35.1 g/dL (ref 31.5–35.7)
MCV: 94 fL (ref 79–97)
Monocytes Absolute: 0.7 10*3/uL (ref 0.1–0.9)
Monocytes: 11 %
Neutrophils Absolute: 3.6 10*3/uL (ref 1.4–7.0)
Neutrophils: 60 %
Platelets: 114 10*3/uL — ABNORMAL LOW (ref 150–450)
RBC: 3.63 x10E6/uL — ABNORMAL LOW (ref 3.77–5.28)
RDW: 12.3 % (ref 11.7–15.4)
WBC: 6 10*3/uL (ref 3.4–10.8)

## 2018-09-24 LAB — COMPREHENSIVE METABOLIC PANEL
ALT: 19 IU/L (ref 0–32)
AST: 25 IU/L (ref 0–40)
Albumin/Globulin Ratio: 1.9 (ref 1.2–2.2)
Albumin: 4.1 g/dL (ref 3.7–4.7)
Alkaline Phosphatase: 55 IU/L (ref 39–117)
BUN/Creatinine Ratio: 22 (ref 12–28)
BUN: 22 mg/dL (ref 8–27)
Bilirubin Total: 0.5 mg/dL (ref 0.0–1.2)
CO2: 23 mmol/L (ref 20–29)
Calcium: 9.2 mg/dL (ref 8.7–10.3)
Chloride: 104 mmol/L (ref 96–106)
Creatinine, Ser: 1 mg/dL (ref 0.57–1.00)
GFR calc Af Amer: 65 mL/min/{1.73_m2} (ref >59)
GFR calc non Af Amer: 57 mL/min/{1.73_m2} — ABNORMAL LOW (ref >59)
Globulin, Total: 2.2 g/dL (ref 1.5–4.5)
Glucose: 151 mg/dL — ABNORMAL HIGH (ref 65–99)
Potassium: 4.7 mmol/L (ref 3.5–5.2)
Sodium: 142 mmol/L (ref 134–144)
Total Protein: 6.3 g/dL (ref 6.0–8.5)

## 2018-09-24 LAB — LIPID PANEL
Chol/HDL Ratio: 4 ratio (ref 0.0–4.4)
Cholesterol, Total: 152 mg/dL (ref 100–199)
HDL: 38 mg/dL — ABNORMAL LOW (ref >39)
LDL Calculated: 82 mg/dL (ref 0–99)
Triglycerides: 162 mg/dL — ABNORMAL HIGH (ref 0–149)
VLDL Cholesterol Cal: 32 mg/dL (ref 5–40)

## 2018-09-24 LAB — TSH: TSH: 1.76 u[IU]/mL (ref 0.450–4.500)

## 2018-09-24 LAB — CULTURE, URINE COMPREHENSIVE

## 2018-09-24 LAB — HEMOGLOBIN A1C
Est. average glucose Bld gHb Est-mCnc: 148 mg/dL
Hgb A1c MFr Bld: 6.8 % — ABNORMAL HIGH (ref 4.8–5.6)

## 2018-09-25 ENCOUNTER — Telehealth: Payer: Self-pay | Admitting: *Deleted

## 2018-09-25 NOTE — Telephone Encounter (Signed)
Pt returned missed call.  Pt is available now.  Thanks, [employer]

## 2018-09-25 NOTE — Telephone Encounter (Signed)
-----   Message from Mar Daring, PA-C sent at 09/25/2018  7:44 AM EST ----- Urine culture was positive for e.coli. It is susceptible to the antibiotic you were placed on. Continue until completed and call if symptoms do not completely resolve or return.

## 2018-09-25 NOTE — Telephone Encounter (Signed)
-----   Message from Mar Daring, PA-C sent at 09/25/2018  7:48 AM EST ----- Blood count stable. Kidney function normal. Liver enzymes normal. Thyroid normal. Cholesterol stable from last year. A1c up from 6.7 to 6.8. I would recommend to restart metformin at this time to protect your other organs from the excess sugar in the blood stream.

## 2018-09-25 NOTE — Telephone Encounter (Signed)
LMOVM for pt to return call 

## 2018-09-25 NOTE — Telephone Encounter (Signed)
Patient was advised of both lab result and urine culture. Patient states she will hold off on the metformin until her office visit on 10/04/2018. Patient states that she feels much better after taking the antibiotic.

## 2018-09-25 NOTE — Telephone Encounter (Signed)
Noted  

## 2018-09-27 ENCOUNTER — Ambulatory Visit: Payer: Medicare Other

## 2018-10-04 ENCOUNTER — Encounter: Payer: Self-pay | Admitting: Physician Assistant

## 2018-10-04 ENCOUNTER — Ambulatory Visit (INDEPENDENT_AMBULATORY_CARE_PROVIDER_SITE_OTHER): Payer: Medicare Other | Admitting: Physician Assistant

## 2018-10-04 ENCOUNTER — Ambulatory Visit (INDEPENDENT_AMBULATORY_CARE_PROVIDER_SITE_OTHER): Payer: Medicare Other

## 2018-10-04 VITALS — BP 132/54 | HR 68 | Temp 98.5°F | Ht 65.0 in | Wt 226.2 lb

## 2018-10-04 DIAGNOSIS — E11621 Type 2 diabetes mellitus with foot ulcer: Secondary | ICD-10-CM

## 2018-10-04 DIAGNOSIS — L97509 Non-pressure chronic ulcer of other part of unspecified foot with unspecified severity: Secondary | ICD-10-CM | POA: Diagnosis not present

## 2018-10-04 DIAGNOSIS — B353 Tinea pedis: Secondary | ICD-10-CM

## 2018-10-04 DIAGNOSIS — Z0000 Encounter for general adult medical examination without abnormal findings: Secondary | ICD-10-CM

## 2018-10-04 DIAGNOSIS — L97513 Non-pressure chronic ulcer of other part of right foot with necrosis of muscle: Secondary | ICD-10-CM

## 2018-10-04 MED ORDER — CLOTRIMAZOLE-BETAMETHASONE 1-0.05 % EX CREA: 1.0000 "application " | TOPICAL_CREAM | Freq: Two times a day (BID) | CUTANEOUS | 0 refills | Status: DC

## 2018-10-04 MED ORDER — METFORMIN HCL ER 500 MG PO TB24: 500.0000 mg | ORAL_TABLET | Freq: Every day | ORAL | 1 refills | Status: DC

## 2018-10-04 NOTE — Progress Notes (Signed)
Patient: Stacie Porter Female    DOB: 08-20-1946   73 y.o.   MRN: [id number] Visit Date: 10/04/2018  Today's Provider: Mar Daring, PA-C   Chief Complaint  Patient presents with  . Follow-up    HTN and T2DM   Subjective:   Patient had her AWV with [facility] this morning.  HPI  Hypertension, follow-up:  BP Readings from Last 3 Encounters:  10/04/18 (!) 132/54  09/20/18 138/65  04/03/18 130/70    She was last seen for hypertension 6 months ago.  BP at that visit was 130/70 Management since that visit includes none. She reports excellent compliance with treatment. She is not having side effects.  She is exercising, walking.  She is not adherent to low salt diet.   Outside blood pressures are 130-120/60-some 58 She is experiencing none.  Patient denies chest pain, chest pressure/discomfort, exertional chest pressure/discomfort, fatigue, irregular heart beat, lower extremity edema, near-syncope and palpitations.   Cardiovascular risk factors include advanced age (older than 47 for men, 35 for women), diabetes mellitus and hypertension.     Weight trend: stable Wt Readings from Last 3 Encounters:  10/04/18 226 lb 3.2 oz (102.6 kg)  04/03/18 227 lb 9.6 oz (103.2 kg)  01/23/18 229 lb (103.9 kg)    Current diet: well balanced  ------------------------------------------------------------------------   Diabetes Mellitus Type II, Follow-up:   Lab Results  Component Value Date   HGBA1C 6.8 (H) 09/23/2018   HGBA1C 6.7 (A) 01/23/2018   HGBA1C 6.7 (H) 09/20/2017    Last seen for diabetes 6 months ago.  Management since then includes on 09/23/2018 patient had labs done and was recommended to restart Metformin She reports excellent compliance with treatment. She is not having side effects.  Current symptoms include none and have been stable. Home blood sugar records: not checking  Episodes of hypoglycemia? no   Current Insulin Regimen: none Most Recent Eye  Exam: 08/2018 Weight trend: stable Prior visit with dietician: no  Pertinent Labs:    Component Value Date/Time   CHOL 152 09/23/2018 0000   TRIG 162 (H) 09/23/2018 0000   HDL 38 (L) 09/23/2018 0000   LDLCALC 82 09/23/2018 0000   CREATININE 1.00 09/23/2018 0000   CREATININE 0.65 12/03/2014 0054    Wt Readings from Last 3 Encounters:  10/04/18 226 lb 3.2 oz (102.6 kg)  04/03/18 227 lb 9.6 oz (103.2 kg)  01/23/18 229 lb (103.9 kg)   ------------------------------------------------------------------------   Allergies  Allergen Reactions  . Ibuprofen     Elevates BP if taken continuously  . Meloxicam Swelling  . Multaq  [Dronedarone]      Current Outpatient Medications:  .  aspirin 81 MG EC tablet, TAKE 1 TABLET BY MOUTH EVERY DAY, Disp: 90 tablet, Rfl: 3 .  benzonatate (TESSALON) 200 MG capsule, Take 1 capsule (200 mg total) by mouth 2 (two) times daily as needed for cough. (Patient not taking: Reported on 09/20/2018), Disp: 20 capsule, Rfl: 0 .  Calcium Carbonate-Vitamin D 600-200 MG-UNIT CAPS, Take 1 tablet by mouth daily., Disp: , Rfl:  .  Glucosamine-Chondroitin 500-400 MG CAPS, Take 1 tablet by mouth daily., Disp: , Rfl:  .  hydrochlorothiazide (HYDRODIURIL) 25 MG tablet, Take 25 mg by mouth daily. , Disp: , Rfl:  .  lisinopril (PRINIVIL,ZESTRIL) 40 MG tablet, Take 40 mg by mouth daily. , Disp: , Rfl:  .  metoprolol tartrate (LOPRESSOR) 25 MG tablet, Take 12.5 mg by mouth 2 (two) times  daily. , Disp: , Rfl:  .  MULTIPLE VITAMIN PO, Take 1 tablet by mouth daily., Disp: , Rfl:  .  omeprazole (PRILOSEC) 20 MG capsule, TAKE 1 CAPSULES BY MOUTH EVERY DAY, Disp: 90 capsule, Rfl: 1 .  simvastatin (ZOCOR) 20 MG tablet, TAKE 1 TABLET BY MOUTH DAILY, Disp: 90 tablet, Rfl: 2 .  sulfamethoxazole-trimethoprim (BACTRIM DS,SEPTRA DS) 800-160 MG tablet, Take 1 tablet by mouth 2 (two) times daily. (Patient not taking: Reported on 10/04/2018), Disp: 10 tablet, Rfl: 0  Review of Systems    Constitutional: Negative.   HENT: Positive for hearing loss.   Eyes: Negative.   Respiratory: Positive for apnea.   Cardiovascular: Negative.   Gastrointestinal: Negative.   Endocrine: Negative.   Genitourinary: Negative.   Musculoskeletal: Negative.   Skin: Positive for wound.  Allergic/Immunologic: Negative.   Neurological: Negative.   Hematological: Negative.   Psychiatric/Behavioral: Negative.     Social History   Tobacco Use  . Smoking status: Never Smoker  . Smokeless tobacco: Never Used  Substance Use Topics  . Alcohol use: No      Objective:  Vitals: BP (!) 132/54 (BP Location: Right Arm)   Pulse 68   Temp 98.5 F (36.9 C) (Oral)   Ht 5\' 5"  (1.651 m)   Wt 226 lb 3.2 oz (102.6 kg)   BMI 37.64 kg/m   Body mass index is 37.64 kg/m.  Physical Exam Vitals signs reviewed.  Constitutional:      General: She is not in acute distress.    Appearance: She is well-developed. She is obese. She is not diaphoretic.  HENT:     Head: Normocephalic and atraumatic.     Right Ear: External ear normal.     Left Ear: External ear normal.     Nose: Nose normal.     Mouth/Throat:     Pharynx: No oropharyngeal exudate.  Eyes:     General: No scleral icterus.       Right eye: No discharge.        Left eye: No discharge.     Conjunctiva/sclera: Conjunctivae normal.     Pupils: Pupils are equal, round, and reactive to light.  Neck:     Musculoskeletal: Normal range of motion and neck supple.     Thyroid: No thyromegaly.     Vascular: No JVD.     Trachea: No tracheal deviation.  Cardiovascular:     Rate and Rhythm: Normal rate and regular rhythm.     Pulses:          Dorsalis pedis pulses are 2+ on the right side and 2+ on the left side.       Posterior tibial pulses are 2+ on the right side and 2+ on the left side.     Heart sounds: Normal heart sounds. No murmur. No friction rub. No gallop.   Pulmonary:     Effort: Pulmonary effort is normal. No respiratory distress.      Breath sounds: Normal breath sounds. No wheezing or rales.  Chest:     Chest wall: No tenderness.  Abdominal:     General: Bowel sounds are normal. There is no distension.     Palpations: Abdomen is soft. There is no mass.     Tenderness: There is no abdominal tenderness. There is no guarding or rebound.  Musculoskeletal: Normal range of motion.        General: No tenderness.     Right foot: Normal range of motion. Deformity (2nd  toe crossing over great toe) present.     Left foot: Normal range of motion. No deformity or bunion.       Feet:  Feet:     Right foot:     Protective Sensation: 10 sites tested. 10 sites sensed.     Skin integrity: Ulcer, erythema and fissure (noted in interdigit web space between great toe and 2nd toe) present.     Toenail Condition: Right toenails are normal.     Left foot:     Protective Sensation: 10 sites tested. 10 sites sensed.     Skin integrity: Skin integrity normal.  Lymphadenopathy:     Cervical: No cervical adenopathy.  Skin:    General: Skin is warm and dry.     Findings: No rash.  Neurological:     Mental Status: She is alert and oriented to person, place, and time.  Psychiatric:        Behavior: Behavior normal.        Thought Content: Thought content normal.        Judgment: Judgment normal.    Diabetic Foot Exam - Simple   Simple Foot Form Diabetic Foot exam was performed with the following findings:  Yes 10/04/2018 10:11 AM  Visual Inspection See comments:  Yes Sensation Testing Intact to touch and monofilament testing bilaterally:  Yes Pulse Check Posterior Tibialis and Dorsalis pulse intact bilaterally:  Yes Comments See foot exam noted above        Assessment & Plan    1. Type 2 diabetes mellitus with foot ulcer, without long-term current use of insulin ([place]) Patient agreeable to start metformin. Will start as below. Discussed dietary changes and limiting carbohydrates. I will see her back in 3 months.  -  metFORMIN (GLUCOPHAGE-XR) 500 MG 24 hr tablet; Take 1 tablet (500 mg total) by mouth daily with breakfast.  Dispense: 90 tablet; Refill: 1 - Ambulatory referral to Wound Clinic  2. Athlete's foot on right Noticed some white skin with cracking noted in the interdigit web space between the great toe and 2nd toe on the right foot. Lotrisone prescribed as below.  - clotrimazole-betamethasone (LOTRISONE) cream; Apply 1 application topically 2 (two) times daily.  Dispense: 30 g; Refill: 0  3. 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 Vickki Muff, podiatry. Today on exam there is clean borders, no fibrin, no necrosis. However, no granulation noted. Tendon exposed. In past was probed to bone. Will refer to wound clinic at patient request for second opinion prior to considering amputation.  - Ambulatory referral to Wound Clinic  I spent approximately 35 minutes with the patient today. Over 50% of this time was spent with counseling and educating the patient.    Mar Daring, PA-C  [facility]

## 2018-10-04 NOTE — Patient Instructions (Signed)
Carbohydrate Counting for Diabetes Mellitus, Adult  Carbohydrate counting is a method of keeping track of how many carbohydrates you eat. Eating carbohydrates naturally increases the amount of sugar (glucose) in the blood. Counting how many carbohydrates you eat helps keep your blood glucose within normal limits, which helps you manage your diabetes (diabetes mellitus). It is important to know how many carbohydrates you can safely have in each meal. This is different for every person. A diet and nutrition specialist (registered dietitian) can help you make a meal plan and calculate how many carbohydrates you should have at each meal and snack. Carbohydrates are found in the following foods:  Grains, such as breads and cereals.  Dried beans and soy products.  Starchy vegetables, such as potatoes, peas, and corn.  Fruit and fruit juices.  Milk and yogurt.  Sweets and snack foods, such as cake, cookies, candy, chips, and soft drinks. How do I count carbohydrates? There are two ways to count carbohydrates in food. You can use either of the methods or a combination of both. Reading "Nutrition Facts" on packaged food The "Nutrition Facts" list is included on the labels of almost all packaged foods and beverages in the [place] It includes:  The serving size.  Information about nutrients in each serving, including the grams (g) of carbohydrate per serving. To use the "Nutrition Facts":  Decide how many servings you will have.  Multiply the number of servings by the number of carbohydrates per serving.  The resulting number is the total amount of carbohydrates that you will be having. Learning standard serving sizes of other foods When you eat carbohydrate foods that are not packaged or do not include "Nutrition Facts" on the label, you need to measure the servings in order to count the amount of carbohydrates:  Measure the foods that you will eat with a food scale or measuring cup, if needed.   Decide how many standard-size servings you will eat.  Multiply the number of servings by 15. Most carbohydrate-rich foods have about 15 g of carbohydrates per serving. ? For example, if you eat 8 oz (170 g) of strawberries, you will have eaten 2 servings and 30 g of carbohydrates (2 servings x 15 g = 30 g).  For foods that have more than one food mixed, such as soups and casseroles, you must count the carbohydrates in each food that is included. The following list contains standard serving sizes of common carbohydrate-rich foods. Each of these servings has about 15 g of carbohydrates:   hamburger bun or  English muffin.   oz (15 mL) syrup.   oz (14 g) jelly.  1 slice of bread.  1 six-inch tortilla.  3 oz (85 g) cooked rice or pasta.  4 oz (113 g) cooked dried beans.  4 oz (113 g) starchy vegetable, such as peas, corn, or potatoes.  4 oz (113 g) hot cereal.  4 oz (113 g) mashed potatoes or  of a large baked potato.  4 oz (113 g) canned or frozen fruit.  4 oz (120 mL) fruit juice.  4-6 crackers.  6 chicken nuggets.  6 oz (170 g) unsweetened dry cereal.  6 oz (170 g) plain fat-free yogurt or yogurt sweetened with artificial sweeteners.  8 oz (240 mL) milk.  8 oz (170 g) fresh fruit or one small piece of fruit.  24 oz (680 g) popped popcorn. Example of carbohydrate counting Sample meal  3 oz (85 g) chicken breast.  6 oz (170 g)   brown rice.  4 oz (113 g) corn.  8 oz (240 mL) milk.  8 oz (170 g) strawberries with sugar-free whipped topping. Carbohydrate calculation 1. Identify the foods that contain carbohydrates: ? Rice. ? Corn. ? Milk. ? Strawberries. 2. Calculate how many servings you have of each food: ? 2 servings rice. ? 1 serving corn. ? 1 serving milk. ? 1 serving strawberries. 3. Multiply each number of servings by 15 g: ? 2 servings rice x 15 g = 30 g. ? 1 serving corn x 15 g = 15 g. ? 1 serving milk x 15 g = 15 g. ? 1 serving  strawberries x 15 g = 15 g. 4. Add together all of the amounts to find the total grams of carbohydrates eaten: ? 30 g + 15 g + 15 g + 15 g = 75 g of carbohydrates total. Summary  Carbohydrate counting is a method of keeping track of how many carbohydrates you eat.  Eating carbohydrates naturally increases the amount of sugar (glucose) in the blood.  Counting how many carbohydrates you eat helps keep your blood glucose within normal limits, which helps you manage your diabetes.  A diet and nutrition specialist (registered dietitian) can help you make a meal plan and calculate how many carbohydrates you should have at each meal and snack. This information is not intended to replace advice given to you by your health care provider. Make sure you discuss any questions you have with your health care provider. Document Released: 08/14/2005 Document Revised: 02/21/2017 Document Reviewed: 01/26/2016 Elsevier Interactive Patient Education  2019 [employer] Inc.  

## 2018-10-04 NOTE — Patient Instructions (Signed)
[name] , Thank you for taking time to come for your Medicare Wellness Visit. I appreciate your ongoing commitment to your health goals. Please review the following plan we discussed and let me know if I can assist you in the future.   Screening recommendations/referrals: Colonoscopy: Up to date, due 09/2020 Mammogram: Up to date, due 07/2020 Bone Density: Up to date, due 06/2022 Recommended yearly ophthalmology/optometry visit for glaucoma screening and checkup Recommended yearly dental visit for hygiene and checkup  Vaccinations: Influenza vaccine: Up to date Pneumococcal vaccine: Completed series Tdap vaccine: Up to date, due 08/2024 Shingles vaccine: Pt declines today.     Advanced directives: Please bring a copy of your POA (Power of Attorney) and/or Living Will to your next appointment.   Conditions/risks identified: Fall risk prevention- recommend to remove any items from the home that may cause slips or trips.  Next appointment: 9:00 AM today with Stacie Porter.   Preventive Care 72 Years and Older, Female Preventive care refers to lifestyle choices and visits with your health care provider that can promote health and wellness. What does preventive care include?  A yearly physical exam. This is also called an annual well check.  Dental exams once or twice a year.  Routine eye exams. Ask your health care provider how often you should have your eyes checked.  Personal lifestyle choices, including:  Daily care of your teeth and gums.  Regular physical activity.  Eating a healthy diet.  Avoiding tobacco and drug use.  Limiting alcohol use.  Practicing safe sex.  Taking low-dose aspirin every day.  Taking vitamin and mineral supplements as recommended by your health care provider. What happens during an annual well check? The services and screenings done by your health care provider during your annual well check will depend on your age, overall health,  lifestyle risk factors, and family history of disease. Counseling  Your health care provider may ask you questions about your:  Alcohol use.  Tobacco use.  Drug use.  Emotional well-being.  Home and relationship well-being.  Sexual activity.  Eating habits.  History of falls.  Memory and ability to understand (cognition).  Work and work Statistician.  Reproductive health. Screening  You may have the following tests or measurements:  Height, weight, and BMI.  Blood pressure.  Lipid and cholesterol levels. These may be checked every 5 years, or more frequently if you are over 40 years old.  Skin check.  Lung cancer screening. You may have this screening every year starting at age 72 if you have a 30-pack-year history of smoking and currently smoke or have quit within the past 15 years.  Fecal occult blood test (FOBT) of the stool. You may have this test every year starting at age 72.  Flexible sigmoidoscopy or colonoscopy. You may have a sigmoidoscopy every 5 years or a colonoscopy every 10 years starting at age 72.  Hepatitis C blood test.  Hepatitis B blood test.  Sexually transmitted disease (STD) testing.  Diabetes screening. This is done by checking your blood sugar (glucose) after you have not eaten for a while (fasting). You may have this done every 1-3 years.  Bone density scan. This is done to screen for osteoporosis. You may have this done starting at age 72.  Mammogram. This may be done every 1-2 years. Talk to your health care provider about how often you should have regular mammograms. Talk with your health care provider about your test results, treatment options, and if necessary,  the need for more tests. Vaccines  Your health care provider may recommend certain vaccines, such as:  Influenza vaccine. This is recommended every year.  Tetanus, diphtheria, and acellular pertussis (Tdap, Td) vaccine. You may need a Td booster every 10 years.  Zoster  vaccine. You may need this after age 72.  Pneumococcal 13-valent conjugate (PCV13) vaccine. One dose is recommended after age 72.  Pneumococcal polysaccharide (PPSV23) vaccine. One dose is recommended after age 72. Talk to your health care provider about which screenings and vaccines you need and how often you need them. This information is not intended to replace advice given to you by your health care provider. Make sure you discuss any questions you have with your health care provider. Document Released: 09/10/2015 Document Revised: 05/03/2016 Document Reviewed: 06/15/2015 Elsevier Interactive Patient Education  2017 [employer] Prevention in the Home Falls can cause injuries. They can happen to people of all ages. There are many things you can do to make your home safe and to help prevent falls. What can I do on the outside of my home?  Regularly fix the edges of walkways and driveways and fix any cracks.  Remove anything that might make you trip as you walk through a door, such as a raised step or threshold.  Trim any bushes or trees on the path to your home.  Use bright outdoor lighting.  Clear any walking paths of anything that might make someone trip, such as rocks or tools.  Regularly check to see if handrails are loose or broken. Make sure that both sides of any steps have handrails.  Any raised decks and porches should have guardrails on the edges.  Have any leaves, snow, or ice cleared regularly.  Use sand or salt on walking paths during winter.  Clean up any spills in your garage right away. This includes oil or grease spills. What can I do in the bathroom?  Use night lights.  Install grab bars by the toilet and in the tub and shower. Do not use towel bars as grab bars.  Use non-skid mats or decals in the tub or shower.  If you need to sit down in the shower, use a plastic, non-slip stool.  Keep the floor dry. Clean up any water that spills on the  floor as soon as it happens.  Remove soap buildup in the tub or shower regularly.  Attach bath mats securely with double-sided non-slip rug tape.  Do not have throw rugs and other things on the floor that can make you trip. What can I do in the bedroom?  Use night lights.  Make sure that you have a light by your bed that is easy to reach.  Do not use any sheets or blankets that are too big for your bed. They should not hang down onto the floor.  Have a firm chair that has side arms. You can use this for support while you get dressed.  Do not have throw rugs and other things on the floor that can make you trip. What can I do in the kitchen?  Clean up any spills right away.  Avoid walking on wet floors.  Keep items that you use a lot in easy-to-reach places.  If you need to reach something above you, use a strong step stool that has a grab bar.  Keep electrical cords out of the way.  Do not use floor polish or wax that makes floors slippery. If you must use  wax, use non-skid floor wax.  Do not have throw rugs and other things on the floor that can make you trip. What can I do with my stairs?  Do not leave any items on the stairs.  Make sure that there are handrails on both sides of the stairs and use them. Fix handrails that are broken or loose. Make sure that handrails are as long as the stairways.  Check any carpeting to make sure that it is firmly attached to the stairs. Fix any carpet that is loose or worn.  Avoid having throw rugs at the top or bottom of the stairs. If you do have throw rugs, attach them to the floor with carpet tape.  Make sure that you have a light switch at the top of the stairs and the bottom of the stairs. If you do not have them, ask someone to add them for you. What else can I do to help prevent falls?  Wear shoes that:  Do not have high heels.  Have rubber bottoms.  Are comfortable and fit you well.  Are closed at the toe. Do not wear  sandals.  If you use a stepladder:  Make sure that it is fully opened. Do not climb a closed stepladder.  Make sure that both sides of the stepladder are locked into place.  Ask someone to hold it for you, if possible.  Clearly mark and make sure that you can see:  Any grab bars or handrails.  First and last steps.  Where the edge of each step is.  Use tools that help you move around (mobility aids) if they are needed. These include:  Canes.  Walkers.  Scooters.  Crutches.  Turn on the lights when you go into a dark area. Replace any light bulbs as soon as they burn out.  Set up your furniture so you have a clear path. Avoid moving your furniture around.  If any of your floors are uneven, fix them.  If there are any pets around you, be aware of where they are.  Review your medicines with your doctor. Some medicines can make you feel dizzy. This can increase your chance of falling. Ask your doctor what other things that you can do to help prevent falls. This information is not intended to replace advice given to you by your health care provider. Make sure you discuss any questions you have with your health care provider. Document Released: 06/10/2009 Document Revised: 01/20/2016 Document Reviewed: 09/18/2014 Elsevier Interactive Patient Education  2017 [employer].

## 2018-10-04 NOTE — Progress Notes (Signed)
Subjective:   Stacie Porter is a 73 y.o. female who presents for Medicare Annual (Subsequent) preventive examination.  Review of Systems:  N/A   Cardiac Risk Factors include: advanced age (>20men, >26 women);diabetes mellitus;dyslipidemia;hypertension;obesity (BMI >30kg/m2)     Objective:     Vitals: BP (!) 132/54 (BP Location: Right Arm)   Pulse 68   Temp 98.5 F (36.9 C) (Oral)   Ht 5\' 5"  (1.651 m)   Wt 226 lb 3.2 oz (102.6 kg)   BMI 37.64 kg/m   Body mass index is 37.64 kg/m.  Advanced Directives 10/04/2018 09/28/2016 05/19/2016 08/06/2015 08/02/2015 05/18/2015  Does Patient Have a Medical Advance Directive? Yes Yes No Yes Yes Yes  Type of Paramedic of [place];Living will - - Living will;Healthcare Power of Attorney Living will;Healthcare Power of Attorney Living will;Healthcare Power of Attorney  Copy of [place] in Chart? No - copy requested - - - - -  Would patient like information on creating a medical advance directive? - - Yes Higher education careers adviser given - - -    Tobacco Social History   Tobacco Use  Smoking Status Never Smoker  Smokeless Tobacco Never Used     Counseling given: Not Answered   Clinical Intake:  Pre-visit preparation completed: Yes  Pain : No/denies pain Pain Score: 0-No pain    Diabetes:  Is the patient diabetic?  Yes type 2 If diabetic, was a CBG obtained today?  No  Did the patient bring in their glucometer from home?  No  How often do you monitor your CBG's? Does not.   Financial Strains and Diabetes Management:  Are you having any financial strains with the device, your supplies or your medication? No .  Does the patient want to be seen by Chronic Care Management for management of their diabetes?  No  Would the patient like to be referred to a Nutritionist or for Diabetic Management?  No   Diabetic Exams:  Diabetic Eye Exam: Completed 09/02/18. Requested records to be sent to  clinic.  Diabetic Foot Exam: Completed 10/01/17. Pt has been advised about the importance in completing this exam. Note made to f/u on this at [facility] today.    Nutritional Status: BMI > 30  Obese Nutritional Risks: None   How often do you need to have someone help you when you read instructions, pamphlets, or other written materials from your doctor or pharmacy?: 1 - Never  Interpreter Needed?: No  Information entered by :: [facility], LPN  Past Medical History:  Diagnosis Date  . Diabetes mellitus without complication ([place])   . Hyperlipidemia   . Hypertension    Past Surgical History:  Procedure Laterality Date  . APPENDECTOMY    . cather ablation  2011/2014   cardiac ablation for a fib  . EYE SURGERY Bilateral 05/2014   cataract  . HAMMER TOE SURGERY Right   . HAND SURGERY Bilateral   . LAPAROSCOPIC OOPHERECTOMY    . recession of gums sx    . TUBAL LIGATION     Family History  Problem Relation Age of Onset  . Ovarian cancer Sister   . Cancer Sister        ovarian  . Lung cancer Sister   . Cancer Sister        lung and thyroid  . Thyroid cancer Sister   . Heart disease Mother   . Heart disease Father   . Hypertension Father   . Breast  cancer Neg Hx    Social History   Socioeconomic History  . Marital status: Married    Spouse name: Not on file  . Number of children: 3  . Years of education: Not on file  . Highest education level: Some college, no degree  Occupational History  . Occupation: retired    Comment: part time subsitute   Social Needs  . Financial resource strain: Not hard at all  . Food insecurity:    Worry: Never true    Inability: Never true  . Transportation needs:    Medical: No    Non-medical: No  Tobacco Use  . Smoking status: Never Smoker  . Smokeless tobacco: Never Used  Substance and Sexual Activity  . Alcohol use: No  . Drug use: No  . Sexual activity: Not on file  Lifestyle  . Physical activity:    Days per week: 0 days     Minutes per session: 0 min  . Stress: Only a little  Relationships  . Social connections:    Talks on phone: Patient refused    Gets together: Patient refused    Attends religious service: Patient refused    Active member of club or organization: Patient refused    Attends meetings of clubs or organizations: Patient refused    Relationship status: Patient refused  Other Topics Concern  . Not on file  Social History Narrative  . Not on file    Outpatient Encounter Medications as of 10/04/2018  Medication Sig  . aspirin 81 MG EC tablet TAKE 1 TABLET BY MOUTH EVERY DAY  . Calcium Carbonate-Vitamin D 600-200 MG-UNIT CAPS Take 1 tablet by mouth daily.  . Glucosamine-Chondroitin 500-400 MG CAPS Take 1 tablet by mouth daily.  . hydrochlorothiazide (HYDRODIURIL) 25 MG tablet Take 25 mg by mouth daily.   Marland Kitchen lisinopril (PRINIVIL,ZESTRIL) 40 MG tablet Take 40 mg by mouth daily.   . metoprolol tartrate (LOPRESSOR) 25 MG tablet Take 12.5 mg by mouth 2 (two) times daily.   . MULTIPLE VITAMIN PO Take 1 tablet by mouth daily.  Marland Kitchen omeprazole (PRILOSEC) 20 MG capsule TAKE 1 CAPSULES BY MOUTH EVERY DAY  . simvastatin (ZOCOR) 20 MG tablet TAKE 1 TABLET BY MOUTH DAILY  . benzonatate (TESSALON) 200 MG capsule Take 1 capsule (200 mg total) by mouth 2 (two) times daily as needed for cough. (Patient not taking: Reported on 09/20/2018)  . sulfamethoxazole-trimethoprim (BACTRIM DS,SEPTRA DS) 800-160 MG tablet Take 1 tablet by mouth 2 (two) times daily. (Patient not taking: Reported on 10/04/2018)   No facility-administered encounter medications on file as of 10/04/2018.     Activities of Daily Living In your present state of health, do you have any difficulty performing the following activities: 10/04/2018  Hearing? Y  Comment Does not wear hearing aids.   Vision? N  Difficulty concentrating or making decisions? N  Walking or climbing stairs? N  Dressing or bathing? N  Doing errands, shopping? N  Preparing Food  and eating ? N  Using the Toilet? N  In the past six months, have you accidently leaked urine? N  Do you have problems with loss of bowel control? N  Managing your Medications? N  Managing your Finances? N  Housekeeping or managing your Housekeeping? N  Some recent data might be hidden    Patient Care Team: Mar Daring, PA-C as PCP - General (Family Medicine) Lorelee Cover., MD (Ophthalmology) Corey Skains, MD as Consulting Physician (Cardiology) Ralene Bathe,  MD (Dermatology) Samara Deist, DPM as Referring Physician (Podiatry)    Assessment:   This is a routine wellness examination for Vaneta.  Exercise Activities and Dietary recommendations Current Exercise Habits: Home exercise routine, Type of exercise: walking, Time (Minutes): 30, Frequency (Times/Week): 4, Weekly Exercise (Minutes/Week): 120, Intensity: Mild, Exercise limited by: None identified  Goals    . LIFESTYLE - DECREASE FALLS RISK     Recommend to remove any items from the home that may cause slips or trips.       Fall Risk Fall Risk  10/04/2018 10/01/2017 09/28/2016 09/27/2015  Falls in the past year? 1 No No No  Number falls in past yr: 1 - - -   FALL RISK PREVENTION PERTAINING TO THE HOME:  Any stairs in or around the home WITH handrails? No  Home free of loose throw rugs in walkways, pet beds, electrical cords, etc? Yes  Adequate lighting in your home to reduce risk of falls? Yes   ASSISTIVE DEVICES UTILIZED TO PREVENT FALLS:  Life alert? No  Use of a cane, walker or w/c? No  Grab bars in the bathroom? Yes  Shower chair or bench in shower? Yes  Elevated toilet seat or a handicapped toilet? Yes    TIMED UP AND GO:  Was the test performed? No .     Depression Screen PHQ 2/9 Scores 10/04/2018 10/04/2018 10/01/2017 09/28/2016  PHQ - 2 Score 0 0 0 0  PHQ- 9 Score 0 - - -     Cognitive Function: Declined today.         Immunization History  Administered Date(s) Administered  .  Influenza Split 06/23/2006, 05/29/2009, 06/15/2010  . Influenza, High Dose Seasonal PF 06/24/2014, 06/22/2017  . Influenza,inj,Quad PF,6+ Mos 06/29/2015  . Influenza-Unspecified 05/01/2018  . Pneumococcal Conjugate-13 09/23/2014  . Pneumococcal Polysaccharide-23 07/01/2012  . Td 08/28/1997, 03/20/2008  . Tdap 09/23/2014    Qualifies for Shingles Vaccine? Yes . Due for Shingrix. Education has been provided regarding the importance of this vaccine. Pt has been advised to call insurance company to determine out of pocket expense. Advised may also receive vaccine at local pharmacy or Health Dept. Verbalized acceptance and understanding.  Tdap: Up to date  Flu Vaccine: Up to date  Pneumococcal Vaccine: Up to date   Screening Tests Health Maintenance  Topic Date Due  . FOOT EXAM  10/01/2018  . HEMOGLOBIN A1C  03/24/2019  . OPHTHALMOLOGY EXAM  09/03/2019  . MAMMOGRAM  08/05/2020  . COLONOSCOPY  10/11/2020  . DEXA SCAN  07/04/2022  . TETANUS/TDAP  09/23/2024  . INFLUENZA VACCINE  Completed  . Hepatitis C Screening  Completed  . PNA vac Low Risk Adult  Completed    Cancer Screenings:  Colorectal Screening: Completed 10/11/10. Repeat every 10 years.  Mammogram: Completed 08/05/18.   Bone Density: Completed 07/04/12. Results showed normal. Repeat in 10 years.   Lung Cancer Screening: (Low Dose CT Chest recommended if Age 66-80 years, 30 pack-year currently smoking OR have quit w/in 15years.) does not qualify.    Additional Screening:  Hepatitis C Screening: Up to date  Vision Screening: Recommended annual ophthalmology exams for early detection of glaucoma and other disorders of the eye.  Dental Screening: Recommended annual dental exams for proper oral hygiene  Community Resource Referral:  CRR required this visit?  No       Plan:  I have personally reviewed and addressed the Medicare Annual Wellness questionnaire and have noted the following in the patient's chart:  A. Medical and social history B. Use of alcohol, tobacco or illicit drugs  C. Current medications and supplements D. Functional ability and status E.  Nutritional status F.  Physical activity G. Advance directives H. List of other physicians I.  Hospitalizations, surgeries, and ER visits in previous 12 months J.  [place] such as hearing and vision if needed, cognitive and depression L. Referrals and appointments - none  In addition, I have reviewed and discussed with patient certain preventive protocols, quality metrics, and best practice recommendations. A written personalized care plan for preventive services as well as general preventive health recommendations were provided to patient.  See attached scanned questionnaire for additional information.   Signed,  Fabio Neighbors, LPN Nurse Health Advisor   Nurse Recommendations: Pt needs a diabetic foot exam today.

## 2018-10-07 ENCOUNTER — Telehealth: Payer: Self-pay | Admitting: Physician Assistant

## 2018-10-07 DIAGNOSIS — E11621 Type 2 diabetes mellitus with foot ulcer: Secondary | ICD-10-CM

## 2018-10-07 DIAGNOSIS — L97509 Non-pressure chronic ulcer of other part of unspecified foot with unspecified severity: Principal | ICD-10-CM

## 2018-10-07 MED ORDER — DAPAGLIFLOZIN PROPANEDIOL 5 MG PO TABS: 5.0000 mg | ORAL_TABLET | Freq: Every day | ORAL | 3 refills | Status: DC

## 2018-10-07 NOTE — Telephone Encounter (Signed)
Stop metformin. Will send in [place] for her.

## 2018-10-07 NOTE — Telephone Encounter (Signed)
Pt states she cannot take the metformin.  She took one pill Saturday morning and it caused her to have diarrhea all day.  She wants someone to call her back  [phone].  Pllease try to call before 11:00 am or after 3:30  Thanks Stacie Porter

## 2018-10-07 NOTE — Telephone Encounter (Signed)
Patient advised.

## 2018-10-10 ENCOUNTER — Other Ambulatory Visit (HOSPITAL_COMMUNITY): Payer: Self-pay | Admitting: Physician Assistant

## 2018-10-10 ENCOUNTER — Encounter: Payer: Medicare Other | Attending: Physician Assistant | Admitting: Physician Assistant

## 2018-10-10 ENCOUNTER — Other Ambulatory Visit: Payer: Self-pay | Admitting: Physician Assistant

## 2018-10-10 DIAGNOSIS — M86671 Other chronic osteomyelitis, right ankle and foot: Secondary | ICD-10-CM | POA: Diagnosis not present

## 2018-10-10 DIAGNOSIS — Z886 Allergy status to analgesic agent status: Secondary | ICD-10-CM | POA: Diagnosis not present

## 2018-10-10 DIAGNOSIS — Z888 Allergy status to other drugs, medicaments and biological substances status: Secondary | ICD-10-CM | POA: Insufficient documentation

## 2018-10-10 DIAGNOSIS — I482 Chronic atrial fibrillation, unspecified: Secondary | ICD-10-CM | POA: Insufficient documentation

## 2018-10-10 DIAGNOSIS — E11621 Type 2 diabetes mellitus with foot ulcer: Secondary | ICD-10-CM | POA: Insufficient documentation

## 2018-10-10 DIAGNOSIS — I10 Essential (primary) hypertension: Secondary | ICD-10-CM | POA: Diagnosis not present

## 2018-10-10 DIAGNOSIS — Z836 Family history of other diseases of the respiratory system: Secondary | ICD-10-CM | POA: Diagnosis not present

## 2018-10-10 DIAGNOSIS — E1140 Type 2 diabetes mellitus with diabetic neuropathy, unspecified: Secondary | ICD-10-CM | POA: Insufficient documentation

## 2018-10-10 DIAGNOSIS — Z881 Allergy status to other antibiotic agents status: Secondary | ICD-10-CM | POA: Diagnosis not present

## 2018-10-10 DIAGNOSIS — Z8249 Family history of ischemic heart disease and other diseases of the circulatory system: Secondary | ICD-10-CM | POA: Insufficient documentation

## 2018-10-10 DIAGNOSIS — Z809 Family history of malignant neoplasm, unspecified: Secondary | ICD-10-CM | POA: Insufficient documentation

## 2018-10-10 DIAGNOSIS — L97512 Non-pressure chronic ulcer of other part of right foot with fat layer exposed: Secondary | ICD-10-CM | POA: Insufficient documentation

## 2018-10-11 NOTE — Progress Notes (Signed)
ONA, ROEHRS ([id number]) Visit Report for 10/10/2018 Abuse/Suicide Risk Screen Details Patient Name: Stacie Porter, Stacie Porter Date of Service: 10/10/2018 8:45 AM Medical Record Number: [id number] Patient Account Number: [id number] Date of Birth/Sex: 1946/04/22 (73 y.o. Female) Treating RN: Montey Hora Primary Care Briyanna Billingham: Fenton Malling Other Clinician: Referring Elenna Spratling: Fenton Malling Treating Jazz Biddy/Extender: Melburn Hake, HOYT Weeks in Treatment: 0 Abuse/Suicide Risk Screen Items Answer ABUSE/SUICIDE RISK SCREEN: Has anyone close to you tried to hurt or harm you recentlyo No Do you feel uncomfortable with anyone in your familyo No Has anyone forced you do things that you didnot want to doo No Do you have any thoughts of harming yourselfo No Patient displays signs or symptoms of abuse and/or neglect. No Electronic Signature(s) Signed: 10/10/2018 4:47:47 PM By: Montey Hora Entered By: Montey Hora on 10/10/2018 09:04:07 Granados, Stacie Porter ([id number]) -------------------------------------------------------------------------------- Activities of Daily Living Details Patient Name: Stacie Porter Date of Service: 10/10/2018 8:45 AM Medical Record Number: [id number] Patient Account Number: [id number] Date of Birth/Sex: 17-Dec-1945 (73 y.o. Female) Treating RN: Montey Hora Primary Care Abhijay Morriss: Fenton Malling Other Clinician: Referring Elenore Wanninger: Fenton Malling Treating Chayanne Speir/Extender: Melburn Hake, HOYT Weeks in Treatment: 0 Activities of Daily Living Items Answer Activities of Daily Living (Please select one for each item) Drive Automobile Completely Able Take Medications Completely Able Use Telephone Completely Able Care for Appearance Completely Able Use Toilet Completely Able Bath / Shower Completely Able Dress Self Completely Able Feed Self Completely Able Walk Completely Able Get In / Out Bed Completely Able Housework Completely Able Prepare Meals  Completely [place] Completely Able Shop for Self Completely Able Electronic Signature(s) Signed: 10/10/2018 4:47:47 PM By: Montey Hora Entered By: Montey Hora on 10/10/2018 09:04:25 Caicedo, Stacie Porter ([id number]) -------------------------------------------------------------------------------- Education Assessment Details Patient Name: Stacie Porter Date of Service: 10/10/2018 8:45 AM Medical Record Number: [id number] Patient Account Number: [id number] Date of Birth/Sex: August 07, 1946 (73 y.o. Female) Treating RN: Montey Hora Primary Care Dariana Garbett: Fenton Malling Other Clinician: Referring Damarie Schoolfield: Fenton Malling Treating Jamorian Dimaria/Extender: Melburn Hake, HOYT Weeks in Treatment: 0 Primary Learner Assessed: Patient Learning Preferences/Education Level/Primary Language Learning Preference: Explanation, Demonstration Highest Education Level: College or Above Preferred Language: English Cognitive Barrier Assessment/Beliefs Language Barrier: No Translator Needed: No Memory Deficit: No Emotional Barrier: No Cultural/Religious Beliefs Affecting Medical Care: No Physical Barrier Assessment Impaired Vision: No Impaired Hearing: No Decreased Hand dexterity: No Knowledge/Comprehension Assessment Knowledge Level: Medium Comprehension Level: Medium Ability to understand written Medium instructions: Ability to understand verbal Medium instructions: Motivation Assessment Anxiety Level: Calm Cooperation: Cooperative Education Importance: Acknowledges Need Interest in Health Problems: Asks Questions Perception: Coherent Willingness to Engage in Self- Medium Management Activities: Readiness to Engage in Self- Medium Management Activities: Electronic Signature(s) Signed: 10/10/2018 4:47:47 PM By: Montey Hora Entered By: Montey Hora on 10/10/2018 09:04:47 Mcelvain, Camelia Viona Porter  ([id number]) -------------------------------------------------------------------------------- Fall Risk Assessment Details Patient Name: Stacie Porter Date of Service: 10/10/2018 8:45 AM Medical Record Number: [id number] Patient Account Number: [id number] Date of Birth/Sex: [birth year] (73 y.o. Female) Treating RN: Montey Hora Primary Care Heyward Douthit: Fenton Malling Other Clinician: Referring Ayelet Gruenewald: Fenton Malling Treating Aditri Louischarles/Extender: Melburn Hake, HOYT Weeks in Treatment: 0 Fall Risk Assessment Items Have you had 2 or more falls in the last 12 monthso 0 No Have you had any fall that resulted in injury in the last 12 monthso 0 No FALL RISK ASSESSMENT: History of falling - immediate or within 3 months 0 No Secondary diagnosis 0 No Ambulatory aid None/bed rest/wheelchair/nurse 0 Yes Crutches/cane/walker 0 No Furniture  0 No IV Access/Saline Lock 0 No Gait/Training Normal/bed rest/immobile 0 Yes Weak 0 No Impaired 0 No Mental Status Oriented to own ability 0 Yes Electronic Signature(s) Signed: 10/10/2018 4:47:47 PM By: Montey Hora Entered By: Montey Hora on 10/10/2018 09:04:59 Avakian, Stacie W. ([id number]) -------------------------------------------------------------------------------- Foot Assessment Details Patient Name: Stacie Porter Date of Service: 10/10/2018 8:45 AM Medical Record Number: [id number] Patient Account Number: [id number] Date of Birth/Sex: 12/25/1945 (73 y.o. Female) Treating RN: Montey Hora Primary Care Latanza Pfefferkorn: Fenton Malling Other Clinician: Referring Lauralee Waters: Fenton Malling Treating Regina Coppolino/Extender: Melburn Hake, HOYT Weeks in Treatment: 0 Foot Assessment Items Site Locations + = Sensation present, - = Sensation absent, C = Callus, U = Ulcer R = Redness, W = Warmth, M = Maceration, PU = Pre-ulcerative lesion F = Fissure, S = Swelling, D = Dryness Assessment Right: Left: Other Deformity: No No Prior Foot Ulcer: No  No Prior Amputation: No No Charcot Joint: No No Ambulatory Status: Ambulatory Without Help Gait: Steady Electronic Signature(s) Signed: 10/10/2018 4:47:47 PM By: Montey Hora Entered By: Montey Hora on 10/10/2018 09:35:10 Offerdahl, Stacie Porter ([id number]) -------------------------------------------------------------------------------- Nutrition Risk Assessment Details Patient Name: Stacie Porter Date of Service: 10/10/2018 8:45 AM Medical Record Number: [id number] Patient Account Number: [id number] Date of Birth/Sex: [birth year] (73 y.o. Female) Treating RN: Montey Hora Primary Care Andelyn Spade: Fenton Malling Other Clinician: Referring Saket Hellstrom: Fenton Malling Treating Christena Sunderlin/Extender: Melburn Hake, HOYT Weeks in Treatment: 0 Height (in): 65 Weight (lbs): 226.3 Body Mass Index (BMI): 37.7 Nutrition Risk Assessment Items NUTRITION RISK SCREEN: I have an illness or condition that made me change the kind and/or amount of 0 No food I eat I eat fewer than two meals per day 0 No I eat few fruits and vegetables, or milk products 0 No I have three or more drinks of beer, liquor or wine almost every day 0 No I have tooth or mouth problems that make it hard for me to eat 0 No I don't always have enough money to buy the food I need 0 No I eat alone most of the time 0 No I take three or more different prescribed or over-the-counter drugs a day 1 Yes Without wanting to, I have lost or gained 10 pounds in the last six months 0 No I am not always physically able to shop, cook and/or feed myself 0 No Nutrition Protocols Good Risk Protocol 0 No interventions needed Moderate Risk Protocol Electronic Signature(s) Signed: 10/10/2018 4:47:47 PM By: Montey Hora Entered By: Montey Hora on 10/10/2018 09:05:04

## 2018-10-14 NOTE — Progress Notes (Signed)
ZAHLIA, Stacie Porter ([id number]) Visit Report for 10/10/2018 Chief Complaint Document Details Patient Name: Stacie Porter, Stacie Porter Date of Service: 10/10/2018 8:45 AM Medical Record Number: [id number] Patient Account Number: [id number] Date of Birth/Sex: July 23, 1946 (73 y.o. Female) Treating RN: Harold Barban Primary Care Provider: Fenton Malling Other Clinician: Referring Provider: Fenton Malling Treating Provider/Extender: Melburn Hake, HOYT Weeks in Treatment: 0 Information Obtained from: Patient Chief Complaint Right 2nd Toe Ulcer Electronic Signature(s) Signed: 10/11/2018 8:42:52 AM By: Worthy Keeler PA-C Entered By: Worthy Keeler on 10/10/2018 10:16:36 Stacie Porter, Stacie Porter. ([id number]) -------------------------------------------------------------------------------- Debridement Details Patient Name: Stacie Porter Date of Service: 10/10/2018 8:45 AM Medical Record Number: [id number] Patient Account Number: [id number] Date of Birth/Sex: 08/06/1946 (73 y.o. Female) Treating RN: Army Melia Primary Care Provider: Fenton Malling Other Clinician: Referring Provider: Fenton Malling Treating Provider/Extender: Melburn Hake, HOYT Weeks in Treatment: 0 Debridement Performed for Wound #1 Right,Plantar Toe Second Assessment: Performed By: Physician STONE III, HOYT E., PA-C Debridement Type: Debridement Severity of Tissue Pre Other severity specified Debridement: Level of Consciousness (Pre- Awake and Alert procedure): Pre-procedure Verification/Time Yes - 09:57 Out Taken: Start Time: 09:57 Pain Control: Lidocaine Total Area Debrided (L x Porter): 0.8 (cm) x 1 (cm) = 0.8 (cm) Tissue and other material Viable, Non-Viable, Subcutaneous, Skin: Epidermis debrided: Level: Skin/Subcutaneous Tissue Debridement Description: Excisional Instrument: Curette Bleeding: Minimum Hemostasis Achieved: Pressure Response to Treatment: Procedure was tolerated well Level of Consciousness Awake and  Alert (Post-procedure): Post Debridement Measurements of Total Wound Length: (cm) 0.6 Width: (cm) 0.8 Depth: (cm) 0.4 Volume: (cm) 0.151 Character of Wound/Ulcer Post Debridement: Stable Severity of Tissue Post Debridement: Other severity specified Post Procedure Diagnosis Same as Pre-procedure Electronic Signature(s) Signed: 10/11/2018 8:42:52 AM By: Worthy Keeler PA-C Signed: 10/14/2018 8:46:05 AM By: Army Melia Entered By: Army Melia on 10/10/2018 10:03:24 Stacie Porter, Stacie Porter ([id number]) -------------------------------------------------------------------------------- HPI Details Patient Name: Stacie Porter Date of Service: 10/10/2018 8:45 AM Medical Record Number: [id number] Patient Account Number: [id number] Date of Birth/Sex: [birth year] (73 y.o. Female) Treating RN: Harold Barban Primary Care Provider: Fenton Malling Other Clinician: Referring Provider: Fenton Malling Treating Provider/Extender: Melburn Hake, HOYT Weeks in Treatment: 0 History of Present Illness HPI Description: 10/10/18 patient presents today for initial evaluation our clinic concerning an ulcer that she has on her right second toe on the medial/plantar aspect. This has been present for Porter couple of months according to the patient. She has been seeing Dr. Vickki Muff who has recommended amputation. The patient however really was not interested in proceeding with an application surgery. For that reason she decided to potentially get Porter second opinion for the wound center but had wanted to speak with her primary care provider first. She subsequently had her appointment with her PCP and as Porter result of that conversation it was determined that she would come to the wound center for Porter second opinion to see if we agreed with the assessment of having pain if you take or not. The patient is really not having any significant pain although she does state that there is some discomfort. No fevers, chills, nausea, or  vomiting noted at this time. She has Porter history of hypertension, diabetes mellitus type II, and atrial fibrillation for which she has undergone an ablation. She seems to have Porter desire to want to salvage the toe if it all possible there is Porter deformity of this region where the second toe overlaps the first which has caused the pressure and subsequently the wound that we are currently seeing.  I did review patient's x-ray as performed by Dr. Vickki Muff which resulted in some question of osseous destruction of the second toe. Nonetheless this was not definitive. Electronic Signature(s) Signed: 10/11/2018 8:42:52 AM By: Worthy Keeler PA-C Entered By: Worthy Keeler on 10/11/2018 08:40:57 Stacie Porter, Stacie Porter. ([id number]) -------------------------------------------------------------------------------- Physical Exam Details Patient Name: Stacie Porter Date of Service: 10/10/2018 8:45 AM Medical Record Number: [id number] Patient Account Number: [id number] Date of Birth/Sex: 10-14-1945 (73 y.o. Female) Treating RN: Harold Barban Primary Care Provider: Fenton Malling Other Clinician: Referring Provider: Fenton Malling Treating Provider/Extender: Melburn Hake, HOYT Weeks in Treatment: 0 Constitutional sitting or standing blood pressure is within target range for patient.. pulse regular and within target range for patient.Marland Kitchen respirations regular, non-labored and within target range for patient.Marland Kitchen temperature within target range for patient.. Well- nourished and well-hydrated in no acute distress. Eyes conjunctiva clear no eyelid edema noted. pupils equal round and reactive to light and accommodation. Ears, Nose, Mouth, and Throat no gross abnormality of ear auricles or external auditory canals. normal hearing noted during conversation. mucus membranes moist. Respiratory normal breathing without difficulty. clear to auscultation bilaterally. Cardiovascular regular rate and rhythm with normal S1, S2. 2+  dorsalis pedis/posterior tibialis pulses. no clubbing, cyanosis, significant edema, <3 sec cap refill. Gastrointestinal (GI) soft, non-tender, non-distended, +BS. no ventral hernia noted. Musculoskeletal normal gait and posture. no significant deformity or arthritic changes, no loss or range of motion, no clubbing. Psychiatric this patient is able to make decisions and demonstrates good insight into disease process. Alert and Oriented x 3. pleasant and cooperative. Notes Patient's wound bed currently did show tendon in the base of the wound on evaluation and inspection today. There did however appear to be some epithelialization at the base there was some necrotic/macerated tissue noted on the distal portion that did require sharp debridement she tolerated this day without complication post debridement the wound bed appears to be doing much better. Electronic Signature(s) Signed: 10/11/2018 8:42:52 AM By: Worthy Keeler PA-C Entered By: Worthy Keeler on 10/11/2018 08:41:39 Stacie Porter, Stacie Porter ([id number]) -------------------------------------------------------------------------------- Physician Orders Details Patient Name: Stacie Porter Date of Service: 10/10/2018 8:45 AM Medical Record Number: [id number] Patient Account Number: [id number] Date of Birth/Sex: 03-01-1946 (73 y.o. Female) Treating RN: Army Melia Primary Care Provider: Fenton Malling Other Clinician: Referring Provider: Fenton Malling Treating Provider/Extender: Melburn Hake, HOYT Weeks in Treatment: 0 Verbal / Phone Orders: No Diagnosis Coding ICD-10 Coding Code Description E11.621 Type 2 diabetes mellitus with foot ulcer L97.512 Non-pressure chronic ulcer of other part of right foot with fat layer exposed I48.20 Chronic atrial fibrillation, unspecified Wound Cleansing Wound #1 Right,Plantar Toe Second o Clean wound with Normal Saline. Anesthetic (add to Medication List) Wound #1 Right,Plantar Toe Second o  Topical Lidocaine 4% cream applied to wound bed prior to debridement (In Clinic Only). Skin Barriers/Stacie-Wound Care Wound #1 Right,Plantar Toe Second o Antifungal powder-Nystatin Primary Wound Dressing Wound #1 Right,Plantar Toe Second o Mepitel One Contact layer o Silver Collagen - moistened with hydrogel Secondary Dressing Wound #1 Right,Plantar Toe Second o Conform/Kerlix o Other - Lambs wool in between toes for moisture Dressing Change Frequency Wound #1 Right,Plantar Toe Second o Change dressing every other day. Follow-up Appointments Wound #1 Right,Plantar Toe Second o Return Appointment in 1 week. Edema Control Wound #1 Right,Plantar Toe Second o Elevate legs to the level of the heart and pump ankles as often as possible Stacie Porter, Stacie Porter. ([id number]) Off-Loading Wound #1 Right,Plantar Toe Second o Open toe  surgical shoe with peg assist. Medications-please add to medication list. Wound #1 Right,Plantar Toe Second o Other: - Nystatin powder Radiology o Magnetic Resonance Imaging (MRI) - right foot, concentration on 2nd toe Patient Medications Allergies: ibuprofen, meloxicam, Multaq Notifications Medication Indication Start End nystatin 10/10/2018 DOSE topical 100,000 unit/gram powder - powder topical applied to the space between the big toe and second toe daily and with each dressing change. doxycycline hyclate 10/10/2018 DOSE 1 - oral 100 mg capsule - 1 capsule oral taken 2 times Porter day for 15 days Electronic Signature(s) Signed: 10/10/2018 10:25:49 AM By: Worthy Keeler PA-C Entered By: Worthy Keeler on 10/10/2018 10:25:49 Tibbs, Consuelo Porter. ([id number]) -------------------------------------------------------------------------------- Problem List Details Patient Name: Stacie Porter Date of Service: 10/10/2018 8:45 AM Medical Record Number: [id number] Patient Account Number: [id number] Date of Birth/Sex: [birth year] (73 y.o. Female) Treating RN:  Harold Barban Primary Care Provider: Fenton Malling Other Clinician: Referring Provider: Fenton Malling Treating Provider/Extender: Melburn Hake, HOYT Weeks in Treatment: 0 Active Problems ICD-10 Evaluated Encounter Code Description Active Date Today Diagnosis E11.621 Type 2 diabetes mellitus with foot ulcer 10/10/2018 No Yes L97.512 Non-pressure chronic ulcer of other part of right foot with fat 10/10/2018 No Yes layer exposed I48.20 Chronic atrial fibrillation, unspecified 10/10/2018 No Yes Inactive Problems Resolved Problems Electronic Signature(s) Signed: 10/11/2018 8:42:52 AM By: Worthy Keeler PA-C Entered By: Worthy Keeler on 10/10/2018 09:43:56 Stacie Porter, Stacie Porter. ([id number]) -------------------------------------------------------------------------------- Progress Note Details Patient Name: Stacie Porter Date of Service: 10/10/2018 8:45 AM Medical Record Number: [id number] Patient Account Number: [id number] Date of Birth/Sex: [birth year] (73 y.o. Female) Treating RN: Harold Barban Primary Care Provider: Fenton Malling Other Clinician: Referring Provider: Fenton Malling Treating Provider/Extender: Melburn Hake, HOYT Weeks in Treatment: 0 Subjective Chief Complaint Information obtained from Patient Right 2nd Toe Ulcer History of Present Illness (HPI) 10/10/18 patient presents today for initial evaluation our clinic concerning an ulcer that she has on her right second toe on the medial/plantar aspect. This has been present for Porter couple of months according to the patient. She has been seeing Dr. Vickki Muff who has recommended amputation. The patient however really was not interested in proceeding with an application surgery. For that reason she decided to potentially get Porter second opinion for the wound center but had wanted to speak with her primary care provider first. She subsequently had her appointment with her PCP and as Porter result of that conversation it  was determined that she would come to the wound center for Porter second opinion to see if we agreed with the assessment of having pain if you take or not. The patient is really not having any significant pain although she does state that there is some discomfort. No fevers, chills, nausea, or vomiting noted at this time. She has Porter history of hypertension, diabetes mellitus type II, and atrial fibrillation for which she has undergone an ablation. She seems to have Porter desire to want to salvage the toe if it all possible there is Porter deformity of this region where the second toe overlaps the first which has caused the pressure and subsequently the wound that we are currently seeing. I did review patient's x-ray as performed by Dr. Vickki Muff which resulted in some question of osseous destruction of the second toe. Nonetheless this was not definitive. Wound History Patient presents with 1 open wound that has been present for approximately 2 months. Patient has been treating wound in the following manner: neosporin. Laboratory tests have not been performed in  the last month. Patient reportedly has not tested positive for an antibiotic resistant organism. Patient reportedly has not tested positive for osteomyelitis. Patient reportedly has not had testing performed to evaluate circulation in the legs. Patient experiences the following problems associated with their wounds: swelling. Patient History Information obtained from Patient. Allergies ibuprofen, meloxicam, Multaq Family History Cancer - Siblings, Diabetes - Mother, Heart Disease - Mother,Father, Hypertension - Mother,Father, Lung Disease - Siblings, Stroke - Mother, No family history of Hereditary Spherocytosis, Kidney Disease, Seizures, Thyroid Problems, Tuberculosis. Social History Never smoker, Marital Status - Married, Alcohol Use - Never, Drug Use - No History, Caffeine Use - Never. Medical History Eyes Patient has history of Cataracts -  removed Denies history of Glaucoma, Optic Neuritis Ear/Nose/Mouth/Throat Mayer, Dniya Porter. ([id number]) Denies history of Chronic sinus problems/congestion, Middle ear problems Hematologic/Lymphatic Denies history of Anemia, Hemophilia, Human Immunodeficiency Virus, Lymphedema, Sickle Cell Disease Respiratory Denies history of Aspiration, Asthma, Chronic Obstructive Pulmonary Disease (COPD), Pneumothorax, Sleep Apnea, Tuberculosis Cardiovascular Patient has history of Arrhythmia - hx Porter fib, Hypertension Denies history of Angina, Congestive Heart Failure, Coronary Artery Disease, Deep Vein Thrombosis, Hypotension, Myocardial Infarction, Peripheral Arterial Disease, Peripheral Venous Disease, Phlebitis, Vasculitis Gastrointestinal Denies history of Cirrhosis , Colitis, Crohn s, Hepatitis Porter, Hepatitis B, Hepatitis C Endocrine Patient has history of Type II Diabetes Denies history of Type I Diabetes Genitourinary Denies history of End Stage Renal Disease Immunological Denies history of Lupus Erythematosus, Raynaud s, Scleroderma Integumentary (Skin) Denies history of History of Burn, History of pressure wounds Musculoskeletal Denies history of Gout, Rheumatoid Arthritis, Osteoarthritis, Osteomyelitis Neurologic Patient has history of Neuropathy Denies history of Dementia, Quadriplegia, Paraplegia, Seizure Disorder Oncologic Denies history of Received Chemotherapy, Received Radiation Psychiatric Denies history of Anorexia/bulimia, Confinement Anxiety Patient is treated with Oral Agents. Review of Systems (ROS) Constitutional Symptoms (General Health) Denies complaints or symptoms of Fatigue, Fever, Chills, Marked Weight Change. Eyes Denies complaints or symptoms of Dry Eyes, Vision Changes, Glasses / Contacts. Ear/Nose/Mouth/Throat Denies complaints or symptoms of Difficult clearing ears, Sinusitis. Hematologic/Lymphatic Denies complaints or symptoms of Bleeding / Clotting  Disorders, Human Immunodeficiency Virus. Respiratory Denies complaints or symptoms of Chronic or frequent coughs, Shortness of Breath. Cardiovascular Complains or has symptoms of LE edema. Denies complaints or symptoms of Chest pain. Gastrointestinal Denies complaints or symptoms of Frequent diarrhea, Nausea, Vomiting. Endocrine Denies complaints or symptoms of Hepatitis, Thyroid disease, Polydypsia (Excessive Thirst). Genitourinary Denies complaints or symptoms of Kidney failure/ Dialysis, Incontinence/dribbling. Immunological Denies complaints or symptoms of Hives, Itching. Integumentary (Skin) Complains or has symptoms of Wounds, Swelling. Denies complaints or symptoms of Bleeding or bruising tendency, Breakdown. Musculoskeletal Denies complaints or symptoms of Muscle Pain, Muscle Weakness. Schertzer, Donnielle Porter. ([id number]) Neurologic Denies complaints or symptoms of Numbness/parasthesias, Focal/Weakness. Psychiatric Denies complaints or symptoms of Anxiety, Claustrophobia. Objective Constitutional sitting or standing blood pressure is within target range for patient.. pulse regular and within target range for patient.Marland Kitchen respirations regular, non-labored and within target range for patient.Marland Kitchen temperature within target range for patient.. Well- nourished and well-hydrated in no acute distress. Vitals Time Taken: 8:51 AM, Height: 65 in, Source: Stated, Weight: 226.3 lbs, Source: Measured, BMI: 37.7, Temperature: 98.3 F, Pulse: 57 bpm, Respiratory Rate: 16 breaths/min, Blood Pressure: 122/42 mmHg. Eyes conjunctiva clear no eyelid edema noted. pupils equal round and reactive to light and accommodation. Ears, Nose, Mouth, and Throat no gross abnormality of ear auricles or external auditory canals. normal hearing noted during conversation. mucus membranes moist. Respiratory normal breathing without difficulty. clear to  auscultation bilaterally. Cardiovascular regular rate and rhythm  with normal S1, S2. 2+ dorsalis pedis/posterior tibialis pulses. no clubbing, cyanosis, significant edema, Gastrointestinal (GI) soft, non-tender, non-distended, +BS. no ventral hernia noted. Musculoskeletal normal gait and posture. no significant deformity or arthritic changes, no loss or range of motion, no clubbing. Psychiatric this patient is able to make decisions and demonstrates good insight into disease process. Alert and Oriented x 3. pleasant and cooperative. General Notes: Patient's wound bed currently did show tendon in the base of the wound on evaluation and inspection today. There did however appear to be some epithelialization at the base there was some necrotic/macerated tissue noted on the distal portion that did require sharp debridement she tolerated this day without complication post debridement the wound bed appears to be doing much better. Integumentary (Hair, Skin) Wound #1 status is Open. Original cause of wound was Gradually Appeared. The wound is located on the Right,Plantar Toe Second. The wound measures 0.8cm length x 1cm width x 0.3cm depth; 0.628cm^2 area and 0.188cm^3 volume. There is tendon and Fat Layer (Subcutaneous Tissue) Exposed exposed. There is no tunneling noted, however, there is undermining starting at 11:00 and ending at 2:00 with Porter maximum distance of 1.1cm. There is Porter medium amount of serous drainage noted. The wound margin is flat and intact. There is small (1-33%) red granulation within the wound bed. There is no necrotic tissue Devan, Merve Porter. ([id number]) within the wound bed. The periwound skin appearance exhibited: Maceration, Erythema. The periwound skin appearance did not exhibit: Callus, Crepitus, Excoriation, Induration, Rash, Scarring, Dry/Scaly, Atrophie Blanche, Cyanosis, Ecchymosis, Hemosiderin Staining, Mottled, Pallor, Rubor. The surrounding wound skin color is noted with erythema which is circumferential. Periwound temperature was  noted as No Abnormality. Other Condition(s) Patient presents with Other Dermatologic Condition located on the Right Foot. The skin appearance exhibited: Maceration. General Notes: patient with moisture associated skin rash to the web of her right 1st and 2nd toes Assessment Active Problems ICD-10 Type 2 diabetes mellitus with foot ulcer Non-pressure chronic ulcer of other part of right foot with fat layer exposed Chronic atrial fibrillation, unspecified Procedures Wound #1 Pre-procedure diagnosis of Wound #1 is Porter Diabetic Wound/Ulcer of the Lower Extremity located on the Right,Plantar Toe Second .Severity of Tissue Pre Debridement is: Other severity specified. There was Porter Excisional Skin/Subcutaneous Tissue Debridement with Porter total area of 0.8 sq cm performed by STONE III, HOYT E., PA-C. With the following instrument(s): Curette to remove Viable and Non-Viable tissue/material. Material removed includes Subcutaneous Tissue and Skin: Epidermis and after achieving pain control using Lidocaine. Porter time out was conducted at 09:57, prior to the start of the procedure. Porter Minimum amount of bleeding was controlled with Pressure. The procedure was tolerated well. Post Debridement Measurements: 0.6cm length x 0.8cm width x 0.4cm depth; 0.151cm^3 volume. Character of Wound/Ulcer Post Debridement is stable. Severity of Tissue Post Debridement is: Other severity specified. Post procedure Diagnosis Wound #1: Same as Pre-Procedure Plan Wound Cleansing: Wound #1 Right,Plantar Toe Second: Clean wound with Normal Saline. Anesthetic (add to Medication List): Wound #1 Right,Plantar Toe Second: Topical Lidocaine 4% cream applied to wound bed prior to debridement (In Clinic Only). Skin Barriers/Stacie-Wound Care: Wound #1 Right,Plantar Toe Second: Antifungal powder-Nystatin Primary Wound Dressing: Wound #1 Right,Plantar Toe Second: Mepitel One Contact layer Stacie Porter, Stacie Porter. ([id number]) Silver Collagen -  moistened with hydrogel Secondary Dressing: Wound #1 Right,Plantar Toe Second: Conform/Kerlix Other - Lambs wool in between toes for moisture Dressing Change Frequency: Wound #1 Right,Plantar Toe  Second: Change dressing every other day. Follow-up Appointments: Wound #1 Right,Plantar Toe Second: Return Appointment in 1 week. Edema Control: Wound #1 Right,Plantar Toe Second: Elevate legs to the level of the heart and pump ankles as often as possible Off-Loading: Wound #1 Right,Plantar Toe Second: Open toe surgical shoe with peg assist. Medications-please add to medication list.: Wound #1 Right,Plantar Toe Second: Other: - Nystatin powder Radiology ordered were: Magnetic Resonance Imaging (MRI) - right foot, concentration on 2nd toe The following medication(s) was prescribed: nystatin topical 100,000 unit/gram powder powder topical applied to the space between the big toe and second toe daily and with each dressing change. starting 10/10/2018 doxycycline hyclate oral 100 mg capsule 1 1 capsule oral taken 2 times Porter day for 15 days starting 10/10/2018 My suggestion currently is that in order to detail exactly how involved the bone may be or not as far as osteomyelitis is concerned that we look into an MRI for further evaluation. She is in agreement with that plan. In the meantime we gonna utilize [employer] along with mepitel to prevent the tinted from drying out and hopefully promote healing. Subsequently we will also use pejorative dressings in between the toes to help with preventing additional maceration. I'm gonna also good for prescription for nystatin powder to fit between the toes in order to help dry out and treat what appears to be Porter fungal infection on the great toe. She's in agreement with all of this. We will see were things stand at follow-up. Please see above for specific wound care orders. We will see patient for re-evaluation in 1 week(s) here in the clinic. If anything worsens  or changes patient will contact our office for additional recommendations. Electronic Signature(s) Signed: 10/11/2018 8:42:52 AM By: Worthy Keeler PA-C Entered By: Worthy Keeler on 10/11/2018 08:41:50 Stacie Porter, Stacie Porter. ([id number]) -------------------------------------------------------------------------------- ROS/PFSH Details Patient Name: Stacie Porter Date of Service: 10/10/2018 8:45 AM Medical Record Number: [id number] Patient Account Number: [id number] Date of Birth/Sex: 1946-02-11 (73 y.o. Female) Treating RN: Montey Hora Primary Care Provider: Fenton Malling Other Clinician: Referring Provider: Fenton Malling Treating Provider/Extender: Melburn Hake, HOYT Weeks in Treatment: 0 Information Obtained From Patient Wound History Do you currently have one or more open woundso Yes How many open wounds do you currently haveo 1 Approximately how long have you had your woundso 2 months How have you been treating your wound(s) until nowo neosporin Has your wound(s) ever healed and then re-openedo No Have you had any lab work done in the past montho No Have you tested positive for an antibiotic resistant organism (MRSA, VRE)o No Have you tested positive for osteomyelitis (bone infection)o No Have you had any tests for circulation on your legso No Have you had other problems associated with your woundso Swelling Constitutional Symptoms (General Health) Complaints and Symptoms: Negative for: Fatigue; Fever; Chills; Marked Weight Change Eyes Complaints and Symptoms: Negative for: Dry Eyes; Vision Changes; Glasses / Contacts Medical History: Positive for: Cataracts - removed Negative for: Glaucoma; Optic Neuritis Ear/Nose/Mouth/Throat Complaints and Symptoms: Negative for: Difficult clearing ears; Sinusitis Medical History: Negative for: Chronic sinus problems/congestion; Middle ear problems Hematologic/Lymphatic Complaints and Symptoms: Negative for: Bleeding / Clotting  Disorders; Human Immunodeficiency Virus Medical History: Negative for: Anemia; Hemophilia; Human Immunodeficiency Virus; Lymphedema; Sickle Cell Disease Respiratory Complaints and Symptoms: Negative for: Chronic or frequent coughs; Shortness of Breath Stacie Porter, Stacie Porter. ([id number]) Medical History: Negative for: Aspiration; Asthma; Chronic Obstructive Pulmonary Disease (COPD); Pneumothorax; Sleep Apnea; Tuberculosis Cardiovascular Complaints and Symptoms:  Positive for: LE edema Negative for: Chest pain Medical History: Positive for: Arrhythmia - hx Porter fib; Hypertension Negative for: Angina; Congestive Heart Failure; Coronary Artery Disease; Deep Vein Thrombosis; Hypotension; Myocardial Infarction; Peripheral Arterial Disease; Peripheral Venous Disease; Phlebitis; Vasculitis Gastrointestinal Complaints and Symptoms: Negative for: Frequent diarrhea; Nausea; Vomiting Medical History: Negative for: Cirrhosis ; Colitis; Crohnos; Hepatitis Porter; Hepatitis B; Hepatitis C Endocrine Complaints and Symptoms: Negative for: Hepatitis; Thyroid disease; Polydypsia (Excessive Thirst) Medical History: Positive for: Type II Diabetes Negative for: Type I Diabetes Treated with: Oral agents Genitourinary Complaints and Symptoms: Negative for: Kidney failure/ Dialysis; Incontinence/dribbling Medical History: Negative for: End Stage Renal Disease Immunological Complaints and Symptoms: Negative for: Hives; Itching Medical History: Negative for: Lupus Erythematosus; Raynaudos; Scleroderma Integumentary (Skin) Complaints and Symptoms: Positive for: Wounds; Swelling Negative for: Bleeding or bruising tendency; Breakdown Medical History: Negative for: History of Burn; History of pressure wounds Stacie Porter, Stacie Porter. ([id number]) Musculoskeletal Complaints and Symptoms: Negative for: Muscle Pain; Muscle Weakness Medical History: Negative for: Gout; Rheumatoid Arthritis; Osteoarthritis;  Osteomyelitis Neurologic Complaints and Symptoms: Negative for: Numbness/parasthesias; Focal/Weakness Medical History: Positive for: Neuropathy Negative for: Dementia; Quadriplegia; Paraplegia; Seizure Disorder Psychiatric Complaints and Symptoms: Negative for: Anxiety; Claustrophobia Medical History: Negative for: Anorexia/bulimia; Confinement Anxiety Oncologic Medical History: Negative for: Received Chemotherapy; Received Radiation HBO Extended History Items Eyes: Cataracts Immunizations Pneumococcal Vaccine: Received Pneumococcal Vaccination: Yes Immunization Notes: up to date Implantable Devices Family and Social History Cancer: Yes - Siblings; Diabetes: Yes - Mother; Heart Disease: Yes - Mother,Father; Hereditary Spherocytosis: No; Hypertension: Yes - Mother,Father; Kidney Disease: No; Lung Disease: Yes - Siblings; Seizures: No; Stroke: Yes - Mother; Thyroid Problems: No; Tuberculosis: No; Never smoker; Marital Status - Married; Alcohol Use: Never; Drug Use: No History; Caffeine Use: Never; Financial Concerns: No; Food, Clothing or Shelter Needs: No; Support System Lacking: No; Transportation Concerns: No; Advanced Directives: Yes (Not Provided); Patient does not want information on Advanced Directives; Living Will: Yes (Not Provided); Medical Power of Attorney: Yes - spouse (Not Provided) Electronic Signature(s) Signed: 10/10/2018 4:47:47 PM By: Montey Hora Signed: 10/11/2018 8:42:52 AM By: Worthy Keeler PA-C Entered By: Montey Hora on 10/10/2018 09:18:32 Reels, Evalyse Porter. ([id number]) -------------------------------------------------------------------------------- [facility] Details Patient Name: Stacie Porter Date of Service: 10/10/2018 Medical Record Number: [id number] Patient Account Number: [id number] Date of Birth/Sex: 1946-05-10 (73 y.o. Female) Treating RN: Harold Barban Primary Care Provider: Fenton Malling Other Clinician: Referring Provider:  Fenton Malling Treating Provider/Extender: Melburn Hake, HOYT Weeks in Treatment: 0 Diagnosis Coding ICD-10 Codes Code Description E11.621 Type 2 diabetes mellitus with foot ulcer L97.512 Non-pressure chronic ulcer of other part of right foot with fat layer exposed I48.20 Chronic atrial fibrillation, unspecified Facility Procedures CPT4 Code: 25956387 Description: 99213 - WOUND CARE VISIT-LEV 3 EST PT Modifier: Quantity: 1 CPT4 Code: [id number] Description: 11042 - DEB SUBQ TISSUE 20 SQ CM/< ICD-10 Diagnosis Description L97.512 Non-pressure chronic ulcer of other part of right foot with fa Modifier: t layer exposed Quantity: 1 Physician Procedures CPT4 Code: 1884166 Description: 06301 - WC PHYS LEVEL 4 - NEW PT ICD-10 Diagnosis Description E11.621 Type 2 diabetes mellitus with foot ulcer L97.512 Non-pressure chronic ulcer of other part of right foot with fa I48.20 Chronic atrial fibrillation, unspecified Modifier: 25 t layer exposed Quantity: 1 CPT4 Code: 6010932 Description: 11042 - WC PHYS SUBQ TISS 20 SQ CM ICD-10 Diagnosis Description L97.512 Non-pressure chronic ulcer of other part of right foot with fa Modifier: t layer exposed Quantity: 1 Electronic Signature(s) Signed: 10/11/2018 8:42:52 AM By: Joaquim Lai  III, Hoyt PA-C Entered By: Worthy Keeler on 10/11/2018 08:42:14

## 2018-10-14 NOTE — Progress Notes (Signed)
SHARDEA, CWYNAR ([id number]) Visit Report for 10/10/2018 Allergy List Details Patient Name: Stacie Porter, Stacie Porter Date of Service: 10/10/2018 8:45 AM Medical Record Number: [id number] Patient Account Number: [id number] Date of Birth/Sex: 11-14-1945 (73 y.o. Female) Treating RN: Montey Hora Primary Care Jaxtyn Linville: Fenton Malling Other Clinician: Referring Alexiya Franqui: Fenton Malling Treating Morgana Rowley/Extender: STONE III, HOYT Weeks in Treatment: 0 Allergies Active Allergies ibuprofen meloxicam Multaq Allergy Notes Electronic Signature(s) Signed: 10/10/2018 4:47:47 PM By: Montey Hora Entered By: Montey Hora on 10/10/2018 09:03:55 Femia, Estephani Viona Gilmore ([id number]) -------------------------------------------------------------------------------- Arrival Information Details Patient Name: Stacie Porter Date of Service: 10/10/2018 8:45 AM Medical Record Number: [id number] Patient Account Number: [id number] Date of Birth/Sex: 1946/08/26 (73 y.o. Female) Treating RN: Harold Barban Primary Care Nakea Gouger: Fenton Malling Other Clinician: Referring Ashby Moskal: Fenton Malling Treating Morrie Daywalt/Extender: Melburn Hake, HOYT Weeks in Treatment: 0 Visit Information Patient Arrived: Ambulatory Arrival Time: 08:50 Accompanied By: self Transfer Assistance: None Patient Identification Verified: Yes Secondary Verification Process Completed: Yes Electronic Signature(s) Signed: 10/10/2018 4:04:17 PM By: Lorine Bears RCP, RRT, CHT Entered By: Becky Sax, Amado Nash on 10/10/2018 08:50:59 Muse, Anaelle W. ([id number]) -------------------------------------------------------------------------------- Clinic Level of Care Assessment Details Patient Name: Stacie Porter Date of Service: 10/10/2018 8:45 AM Medical Record Number: [id number] Patient Account Number: [id number] Date of Birth/Sex: [birth year] (73 y.o. Female) Treating RN: Army Melia Primary Care Jatavian Calica: Fenton Malling  Other Clinician: Referring Cambrey Lupi: Fenton Malling Treating Padraic Marinos/Extender: Melburn Hake, HOYT Weeks in Treatment: 0 Clinic Level of Care Assessment Items TOOL 1 Quantity Score []  - Use when EandM and Procedure is performed on INITIAL visit 0 ASSESSMENTS - Nursing Assessment / Reassessment X - General Physical Exam (combine w/ comprehensive assessment (listed just below) when 1 20 performed on new pt. evals) X- 1 25 Comprehensive Assessment (HX, ROS, Risk Assessments, Wounds Hx, etc.) ASSESSMENTS - Wound and Skin Assessment / Reassessment []  - Dermatologic / Skin Assessment (not related to wound area) 0 ASSESSMENTS - Ostomy and/or Continence Assessment and Care []  - Incontinence Assessment and Management 0 []  - 0 Ostomy Care Assessment and Management (repouching, etc.) PROCESS - Coordination of Care X - Simple Patient / Family Education for ongoing care 1 15 []  - 0 Complex (extensive) Patient / Family Education for ongoing care X- 1 10 Staff obtains Programmer, systems, Records, Test Results / Process Orders []  - 0 Staff telephones HHA, Nursing Homes / Clarify orders / etc []  - 0 Routine Transfer to another Facility (non-emergent condition) []  - 0 Routine Hospital Admission (non-emergent condition) X- 1 15 New Admissions / Biomedical engineer / Ordering NPWT, Apligraf, etc. []  - 0 Emergency Hospital Admission (emergent condition) PROCESS - Special Needs []  - Pediatric / Minor Patient Management 0 []  - 0 Isolation Patient Management []  - 0 Hearing / Language / Visual special needs []  - 0 Assessment of Community assistance (transportation, D/C planning, etc.) []  - 0 Additional assistance / Altered mentation []  - 0 Support Surface(s) Assessment (bed, cushion, seat, etc.) Bradstreet, Layla W. ([id number]) INTERVENTIONS - Miscellaneous []  - External ear exam 0 []  - 0 Patient Transfer (multiple staff / Civil Service fast streamer / Similar devices) []  - 0 Simple Staple / Suture removal (25 or  less) []  - 0 Complex Staple / Suture removal (26 or more) []  - 0 Hypo/Hyperglycemic Management (do not check if billed separately) X- 1 15 Ankle / Brachial Index (ABI) - do not check if billed separately Has the patient been seen at the hospital within the last three years: Yes Total Score: 100 Level Of Care:  New/Established - Level 3 Electronic Signature(s) Signed: 10/14/2018 8:46:05 AM By: Army Melia Entered By: Army Melia on 10/10/2018 10:12:06 Pazos, Fanny Viona Gilmore ([id number]) -------------------------------------------------------------------------------- Encounter Discharge Information Details Patient Name: Stacie Porter Date of Service: 10/10/2018 8:45 AM Medical Record Number: [id number] Patient Account Number: [id number] Date of Birth/Sex: 1946/08/21 (73 y.o. Female) Treating RN: Harold Barban Primary Care Lambert Jeanty: Fenton Malling Other Clinician: Referring Bruna Dills: Fenton Malling Treating Fareeha Evon/Extender: Melburn Hake, HOYT Weeks in Treatment: 0 Encounter Discharge Information Items Post Procedure Vitals Discharge Condition: Stable Temperature (F): 98.3 Ambulatory Status: Ambulatory Pulse (bpm): 57 Discharge Destination: Home Respiratory Rate (breaths/min): 16 Transportation: Private Auto Blood Pressure (mmHg): 122/42 Accompanied By: self Schedule Follow-up Appointment: Yes Clinical Summary of Care: Electronic Signature(s) Signed: 10/14/2018 9:36:27 AM By: Harold Barban Entered By: Harold Barban on 10/10/2018 10:42:28 Consalvo, Tyrihanna Viona Gilmore ([id number]) -------------------------------------------------------------------------------- Lower Extremity Assessment Details Patient Name: Stacie Porter Date of Service: 10/10/2018 8:45 AM Medical Record Number: [id number] Patient Account Number: [id number] Date of Birth/Sex: 08-02-1946 (73 y.o. Female) Treating RN: Montey Hora Primary Care Timica Marcom: Fenton Malling Other Clinician: Referring Channah Godeaux: Fenton Malling Treating Annslee Tercero/Extender: Melburn Hake, HOYT Weeks in Treatment: 0 Edema Assessment Assessed: [Left: No] [Right: No] Edema: [Left: Yes] [Right: Yes] Calf Left: Right: Point of Measurement: 32 cm From Medial Instep 43.5 cm 42 cm Ankle Left: Right: Point of Measurement: 10 cm From Medial Instep 26 cm 25.5 cm Vascular Assessment Pulses: Dorsalis Pedis Palpable: [Left:Yes] [Right:Yes] Posterior Tibial Palpable: [Left:Yes] [Right:Yes] Extremity colors, hair growth, and conditions: Extremity Color: [Left:Normal] [Right:Normal] Hair Growth on Extremity: [Left:Yes] [Right:Yes] Temperature of Extremity: [Left:Warm] [Right:Warm] Capillary Refill: [Left:< 3 seconds] [Right:< 3 seconds] Blood Pressure: Brachial: [Left:126] Dorsalis Pedis: 132 [Left:Dorsalis Pedis: 563] Ankle: Posterior Tibial: 148 [Left:Posterior Tibial: 146 1.17] [Right:1.16] Toe Nail Assessment Left: Right: Thick: Yes Yes Discolored: No No Deformed: No No Improper Length and Hygiene: Yes Yes Electronic Signature(s) Signed: 10/10/2018 4:47:47 PM By: Montey Hora Entered By: Montey Hora on 10/10/2018 09:34:00 Hinchliffe, Lilianah Viona Gilmore ([id number]) -------------------------------------------------------------------------------- Multi Wound Chart Details Patient Name: Stacie Porter Date of Service: 10/10/2018 8:45 AM Medical Record Number: [id number] Patient Account Number: [id number] Date of Birth/Sex: 04-04-1946 (73 y.o. Female) Treating RN: Army Melia Primary Care Artasia Thang: Fenton Malling Other Clinician: Referring Seaira Byus: Fenton Malling Treating Evian Salguero/Extender: Melburn Hake, HOYT Weeks in Treatment: 0 Vital Signs Height(in): 65 Pulse(bpm): 29 Weight(lbs): 226.3 Blood Pressure(mmHg): 122/42 Body Mass Index(BMI): 38 Temperature(F): 98.3 Respiratory Rate 16 (breaths/min): Photos: [1:No Photos] [N/A:N/A] Wound Location: [1:Right Toe Second - Plantar] [N/A:N/A] Wounding Event: [1:Gradually  Appeared] [N/A:N/A] Primary Etiology: [1:Diabetic Wound/Ulcer of the Lower Extremity] [N/A:N/A] Comorbid History: [1:Cataracts, Arrhythmia, Hypertension, Type II Diabetes, Neuropathy] [N/A:N/A] Date Acquired: [1:08/09/2018] [N/A:N/A] Weeks of Treatment: [1:0] [N/A:N/A] Wound Status: [1:Open] [N/A:N/A] Pending Amputation on [1:Yes] [N/A:N/A] Presentation: Measurements L x W x D [1:0.8x1x0.3] [N/A:N/A] (cm) Area (cm) : [1:0.628] [N/A:N/A] Volume (cm) : [1:0.188] [N/A:N/A] Classification: [1:Grade 2] [N/A:N/A] Exudate Amount: [1:Medium] [N/A:N/A] Exudate Type: [1:Serous] [N/A:N/A] Exudate Color: [1:amber] [N/A:N/A] Wound Margin: [1:Flat and Intact] [N/A:N/A] Granulation Amount: [1:Small (1-33%)] [N/A:N/A] Granulation Quality: [1:Red] [N/A:N/A] Necrotic Amount: [1:None Present (0%)] [N/A:N/A] Exposed Structures: [1:Fat Layer (Subcutaneous Tissue) Exposed: Yes Tendon: Yes Fascia: No Muscle: No Joint: No Bone: No] [N/A:N/A] Epithelialization: [1:None] [N/A:N/A] Periwound Skin Texture: [1:Excoriation: No Induration: No Callus: No Crepitus: No] [N/A:N/A] Rash: No Scarring: No Periwound Skin Moisture: Maceration: Yes N/A N/A Dry/Scaly: No Periwound Skin Color: Erythema: Yes N/A N/A Atrophie Blanche: No Cyanosis: No Ecchymosis: No Hemosiderin Staining: No Mottled: No Pallor: No Rubor: No  Erythema Location: Circumferential N/A N/A Temperature: No Abnormality N/A N/A Tenderness on Palpation: No N/A N/A Wound Preparation: Ulcer Cleansing: N/A N/A Rinsed/Irrigated with Saline Topical Anesthetic Applied: Other: lidocaine 4% Treatment Notes Electronic Signature(s) Signed: 10/14/2018 8:46:05 AM By: Army Melia Entered By: Army Melia on 10/10/2018 09:49:08 Stuber, Lynsay Viona Gilmore ([id number]) -------------------------------------------------------------------------------- [place] Details Patient Name: Stacie Porter Date of Service: 10/10/2018 8:45 AM Medical  Record Number: [id number] Patient Account Number: [id number] Date of Birth/Sex: [birth year] (73 y.o. Female) Treating RN: Army Melia Primary Care Myeisha Kruser: Fenton Malling Other Clinician: Referring Tameya Kuznia: Fenton Malling Treating Rahkeem Senft/Extender: Melburn Hake, HOYT Weeks in Treatment: 0 Active Inactive Medication Nursing Diagnoses: Knowledge deficit related to medication safety: actual or potential Goals: Patient/caregiver will demonstrate understanding of all current medications Date Initiated: 10/10/2018 Target Resolution Date: 11/08/2018 Goal Status: Active Patient/caregiver will demonstrate understanding of new oral/IV medications prescribed at the [facility] (topical prescriptions are covered under the skin breakdown problem) Date Initiated: 10/10/2018 Target Resolution Date: 10/27/2018 Goal Status: Active Interventions: Assess for medication contraindications each visit where new medications are prescribed Provide education on medication safety Notes: Nutrition Nursing Diagnoses: Impaired glucose control: actual or potential Goals: Patient/caregiver verbalizes understanding of need to maintain therapeutic glucose control per primary care physician Date Initiated: 10/10/2018 Target Resolution Date: 11/08/2018 Goal Status: Active Interventions: Provide education on elevated blood sugars and impact on wound healing Notes: Orientation to the Wound Care Program Nursing Diagnoses: Knowledge deficit related to the wound healing center program Goals: Patient/caregiver will verbalize understanding of the [place], [place]. ([id number]) Date Initiated: 10/10/2018 Target Resolution Date: 10/10/2018 Goal Status: Active Interventions: Provide education on orientation to the wound center Notes: Wound/Skin Impairment Nursing Diagnoses: Impaired tissue integrity Goals: Patient/caregiver will verbalize understanding of skin care regimen Date Initiated:  10/10/2018 Target Resolution Date: 11/08/2018 Goal Status: Active Ulcer/skin breakdown will have a volume reduction of 30% by week 4 Date Initiated: 10/10/2018 Target Resolution Date: 11/08/2018 Goal Status: Active Interventions: Assess ulceration(s) every visit Treatment Activities: Referred to DME Slaton Reaser for dressing supplies : 10/10/2018 Skin care regimen initiated : 10/10/2018 Topical wound management initiated : 10/10/2018 Notes: Electronic Signature(s) Signed: 10/14/2018 8:46:05 AM By: Army Melia Entered By: Army Melia on 10/10/2018 09:48:30 Wyeth, Floyd W. ([id number]) -------------------------------------------------------------------------------- Non-Wound Condition Assessment Details Patient Name: Stacie Porter Date of Service: 10/10/2018 8:45 AM Medical Record Number: [id number] Patient Account Number: [id number] Date of Birth/Sex: 1946-02-03 (73 y.o. Female) Treating RN: Montey Hora Primary Care Sarah Zerby: Fenton Malling Other Clinician: Referring Wadsworth Skolnick: Fenton Malling Treating Kymber Kosar/Extender: Melburn Hake, HOYT Weeks in Treatment: 0 Non-Wound Condition: Condition: Other Dermatologic Condition Location: Foot Side: Right Photos Photo Uploaded By: Montey Hora on 10/10/2018 10:16:15 Periwound Skin Texture Texture Color No Abnormalities Noted: No No Abnormalities Noted: No Moisture No Abnormalities Noted: No Maceration: Yes Notes patient with moisture associated skin rash to the web of her right 1st and 2nd toes Electronic Signature(s) Signed: 10/10/2018 4:47:47 PM By: Montey Hora Entered By: Montey Hora on 10/10/2018 09:26:32 Merlin, Jaymie Viona Gilmore ([id number]) -------------------------------------------------------------------------------- Pain Assessment Details Patient Name: Stacie Porter Date of Service: 10/10/2018 8:45 AM Medical Record Number: [id number] Patient Account Number: [id number] Date of Birth/Sex: 12-25-1945 (73 y.o.  Female) Treating RN: Harold Barban Primary Care Lakeitha Basques: Fenton Malling Other Clinician: Referring Shavana Calder: Fenton Malling Treating Bon [place]is/Extender: Melburn Hake, HOYT Weeks in Treatment: 0 Active Problems Location of Pain Severity and Description of Pain Patient Has Paino No Site Locations Pain Management and Medication Current Pain Management: Electronic Signature(s) Signed:  10/10/2018 4:04:17 PM By: Paulla Fore, RRT, CHT Signed: 10/14/2018 9:36:27 AM By: Harold Barban Entered By: Lorine Bears on 10/10/2018 08:51:12 Faerber, Grisell Viona Gilmore ([id number]) -------------------------------------------------------------------------------- Patient/Caregiver Education Details Patient Name: Stacie Porter Date of Service: 10/10/2018 8:45 AM Medical Record Number: [id number] Patient Account Number: [id number] Date of Birth/Gender: 21-Apr-1946 (73 y.o. Female) Treating RN: Army Melia Primary Care Physician: Fenton Malling Other Clinician: Referring Physician: Fenton Malling Treating Physician/Extender: Melburn Hake, HOYT Weeks in Treatment: 0 Education Assessment Education Provided To: Patient Education Topics Provided Elevated Blood Sugar/ Impact on Healing: Handouts: Elevated Blood Sugars: How Do They Affect Wound Healing Methods: Explain/Verbal Responses: State content correctly Medication Safety: Offloading: Handouts: What is Offloadingo Methods: Demonstration Responses: State content correctly Electronic Signature(s) Signed: 10/14/2018 9:36:27 AM By: Harold Barban Entered By: Harold Barban on 10/10/2018 10:42:34 Simeone, Reda Viona Gilmore ([id number]) -------------------------------------------------------------------------------- Wound Assessment Details Patient Name: Stacie Porter Date of Service: 10/10/2018 8:45 AM Medical Record Number: [id number] Patient Account Number: [id number] Date of Birth/Sex: 1946/04/26 (73 y.o.  Female) Treating RN: Montey Hora Primary Care Adriane Gabbert: Fenton Malling Other Clinician: Referring Riah Kehoe: Fenton Malling Treating Whitnee Orzel/Extender: Melburn Hake, HOYT Weeks in Treatment: 0 Wound Status Wound Number: 1 Primary Diabetic Wound/Ulcer of the Lower Extremity Etiology: Wound Location: Right Toe Second - Plantar Wound Status: Open Wounding Event: Gradually Appeared Comorbid Cataracts, Arrhythmia, Hypertension, Type II Date Acquired: 08/09/2018 History: Diabetes, Neuropathy Weeks Of Treatment: 0 Clustered Wound: No Pending Amputation On Presentation Photos Photo Uploaded By: Montey Hora on 10/10/2018 10:13:59 Wound Measurements Length: (cm) 0.8 Width: (cm) 1 Depth: (cm) 0.3 Area: (cm) 0.628 Volume: (cm) 0.188 % Reduction in Area: 0% % Reduction in Volume: 0% Epithelialization: None Tunneling: No Undermining: Yes Starting Position (o'clock): 11 Ending Position (o'clock): 2 Maximum Distance: (cm) 1.1 Wound Description Classification: Grade 2 Wound Margin: Flat and Intact Exudate Amount: Medium Exudate Type: Serous Exudate Color: amber Foul Odor After Cleansing: No Slough/Fibrino No Wound Bed Granulation Amount: Small (1-33%) Exposed Structure Granulation Quality: Red Fascia Exposed: No Necrotic Amount: None Present (0%) Fat Layer (Subcutaneous Tissue) Exposed: Yes Tendon Exposed: Yes Finkbiner, Jahnavi W. ([id number]) Muscle Exposed: No Joint Exposed: No Bone Exposed: No Periwound Skin Texture Texture Color No Abnormalities Noted: No No Abnormalities Noted: No Callus: No Atrophie Blanche: No Crepitus: No Cyanosis: No Excoriation: No Ecchymosis: No Induration: No Erythema: Yes Rash: No Erythema Location: Circumferential Scarring: No Hemosiderin Staining: No Mottled: No Moisture Pallor: No No Abnormalities Noted: No Rubor: No Dry / Scaly: No Maceration: Yes Temperature / Pain Temperature: No Abnormality Wound  Preparation Ulcer Cleansing: Rinsed/Irrigated with Saline Topical Anesthetic Applied: Other: lidocaine 4%, Electronic Signature(s) Signed: 10/10/2018 4:47:47 PM By: Montey Hora Signed: 10/14/2018 8:46:05 AM By: Army Melia Entered By: Army Melia on 10/10/2018 09:53:20 Passon, Anatalia W. ([id number]) -------------------------------------------------------------------------------- [place] Details Patient Name: Stacie Porter Date of Service: 10/10/2018 8:45 AM Medical Record Number: [id number] Patient Account Number: [id number] Date of Birth/Sex: May 22, 1946 (73 y.o. Female) Treating RN: Harold Barban Primary Care Trooper Olander: Fenton Malling Other Clinician: Referring Charisma Charlot: Fenton Malling Treating Orlinda Slomski/Extender: Melburn Hake, HOYT Weeks in Treatment: 0 Vital Signs Time Taken: 08:51 Temperature (F): 98.3 Height (in): 65 Pulse (bpm): 57 Source: Stated Respiratory Rate (breaths/min): 16 Weight (lbs): 226.3 Blood Pressure (mmHg): 122/42 Source: Measured Reference Range: 80 - 120 mg / dl Body Mass Index (BMI): 37.7 Airway Electronic Signature(s) Signed: 10/10/2018 4:04:17 PM By: Lorine Bears RCP, RRT, CHT Entered By: Lorine Bears on 10/10/2018 08:53:50

## 2018-10-17 ENCOUNTER — Encounter: Payer: Medicare Other | Admitting: Physician Assistant

## 2018-10-17 DIAGNOSIS — E11621 Type 2 diabetes mellitus with foot ulcer: Secondary | ICD-10-CM | POA: Diagnosis not present

## 2018-10-18 ENCOUNTER — Ambulatory Visit: Payer: Federal, State, Local not specified - PPO | Admitting: Physician Assistant

## 2018-10-19 ENCOUNTER — Ambulatory Visit: Payer: Federal, State, Local not specified - PPO

## 2018-10-22 ENCOUNTER — Ambulatory Visit
Admission: RE | Admit: 2018-10-22 | Discharge: 2018-10-22 | Disposition: A | Discharge status: Home / Self Care | Payer: Medicare Other | Source: Ambulatory Visit | Attending: Physician Assistant | Admitting: Physician Assistant

## 2018-10-22 DIAGNOSIS — L97512 Non-pressure chronic ulcer of other part of right foot with fat layer exposed: Secondary | ICD-10-CM | POA: Diagnosis present

## 2018-10-22 MED ORDER — GADOBUTROL 1 MMOL/ML IV SOLN
10.0000 mL | Freq: Once | INTRAVENOUS | Status: AC | PRN
  Administered 2018-10-22: 10 mL via INTRAVENOUS

## 2018-10-24 ENCOUNTER — Encounter: Payer: Medicare Other | Admitting: Physician Assistant

## 2018-10-24 DIAGNOSIS — E11621 Type 2 diabetes mellitus with foot ulcer: Secondary | ICD-10-CM | POA: Diagnosis not present

## 2018-10-25 ENCOUNTER — Telehealth: Payer: Self-pay | Admitting: Physician Assistant

## 2018-10-25 DIAGNOSIS — E11621 Type 2 diabetes mellitus with foot ulcer: Secondary | ICD-10-CM

## 2018-10-25 DIAGNOSIS — L97509 Non-pressure chronic ulcer of other part of unspecified foot with unspecified severity: Principal | ICD-10-CM

## 2018-10-25 NOTE — Telephone Encounter (Signed)
Please see other telephone encounter.

## 2018-10-25 NOTE — Telephone Encounter (Signed)
LVMTRC 

## 2018-10-25 NOTE — Telephone Encounter (Signed)
Pt returned call about diabetic counseling   CB#  [phone]  Thanks Con Memos

## 2018-10-25 NOTE — Progress Notes (Signed)
SAVANNHA, WELLE ([id number]) Visit Report for 10/17/2018 Chief Complaint Document Details Patient Name: Stacie Porter, Stacie Porter Date of Service: 10/17/2018 1:30 PM Medical Record Number: [id number] Patient Account Number: [id number] Date of Birth/Sex: 07-10-1946 (73 y.o. F) Treating RN: Montey Hora Primary Care Provider: Fenton Malling Other Clinician: Referring Provider: Fenton Malling Treating Provider/Extender: Melburn Hake, HOYT Weeks in Treatment: 1 Information Obtained from: Patient Chief Complaint Right 2nd Toe Ulcer Electronic Signature(s) Signed: 10/24/2018 9:19:49 AM By: Worthy Keeler PA-C Entered By: Worthy Keeler on 10/17/2018 13:57:24 Matteo, Dore W. ([id number]) -------------------------------------------------------------------------------- HPI Details Patient Name: Stacie Porter Date of Service: 10/17/2018 1:30 PM Medical Record Number: [id number] Patient Account Number: [id number] Date of Birth/Sex: 1946-06-26 (73 y.o. F) Treating RN: Montey Hora Primary Care Provider: Fenton Malling Other Clinician: Referring Provider: Fenton Malling Treating Provider/Extender: Melburn Hake, HOYT Weeks in Treatment: 1 History of Present Illness HPI Description: 10/10/18 patient presents today for initial evaluation our clinic concerning an ulcer that she has on her right second toe on the medial/plantar aspect. This has been present for a couple of months according to the patient. She has been seeing Dr. Vickki Muff who has recommended amputation. The patient however really was not interested in proceeding with an application surgery. For that reason she decided to potentially get a second opinion for the wound center but had wanted to speak with her primary care provider first. She subsequently had her appointment with her PCP and as a result of that conversation it was determined that she would come to the wound center for a second opinion to see if we agreed with the assessment  of having pain if you take or not. The patient is really not having any significant pain although she does state that there is some discomfort. No fevers, chills, nausea, or vomiting noted at this time. She has a history of hypertension, diabetes mellitus type II, and atrial fibrillation for which she has undergone an ablation. She seems to have a desire to want to salvage the toe if it all possible there is a deformity of this region where the second toe overlaps the first which has caused the pressure and subsequently the wound that we are currently seeing. I did review patient's x-ray as performed by Dr. Vickki Muff which resulted in some question of osseous destruction of the second toe. Nonetheless this was not definitive. 10/17/18 on evaluation today patient's toe actually appears to be doing better in my pinion. She seems to show some signs of epithelialization around the edge of the toe I do believe that she is much less callous although there was a little bit of dry skin and dressing surrounding the wound opening. I think that is just a matter of not putting as much of the Prisma all around but just put it more in the central portion of the wound that will help prevent that type of thing from occurring. In general the maceration between the toes appears to be greatly improved. Overall very pleased in this regard. Her MRI was scheduled originally for Saturday which is two days away she changed this to next Tuesday. She will have this done however before I see her back for follow-up next Thursday. Electronic Signature(s) Signed: 10/24/2018 9:19:49 AM By: Worthy Keeler PA-C Entered By: Worthy Keeler on 10/18/2018 10:19:09 Guthridge, Aarilyn Viona Gilmore ([id number]) -------------------------------------------------------------------------------- Physical Exam Details Patient Name: Stacie Porter Date of Service: 10/17/2018 1:30 PM Medical Record Number: [id number] Patient Account Number: [id number] Date of  Birth/Sex: 08-15-1946 (  73 y.o. F) Treating RN: Montey Hora Primary Care Provider: Fenton Malling Other Clinician: Referring Provider: Fenton Malling Treating Provider/Extender: Melburn Hake, HOYT Weeks in Treatment: 1 Constitutional Well-nourished and well-hydrated in no acute distress. Respiratory normal breathing without difficulty. clear to auscultation bilaterally. Cardiovascular regular rate and rhythm with normal S1, S2. Psychiatric this patient is able to make decisions and demonstrates good insight into disease process. Alert and Oriented x 3. pleasant and cooperative. Notes Patient's wound bed currently shows evidence of good epithelialization around the edge of the wound without any complication. Fortunately there are no signs of infection. The maceration is also greatly improved. Overall I'm pleased with how things seem to be progressing. Electronic Signature(s) Signed: 10/24/2018 9:19:49 AM By: Worthy Keeler PA-C Entered By: Worthy Keeler on 10/18/2018 10:19:47 Dibartolo, Charlee Viona Gilmore ([id number]) -------------------------------------------------------------------------------- Physician Orders Details Patient Name: Stacie Porter Date of Service: 10/17/2018 1:30 PM Medical Record Number: [id number] Patient Account Number: [id number] Date of Birth/Sex: Nov 24, 1945 (73 y.o. F) Treating RN: Army Melia Primary Care Provider: Fenton Malling Other Clinician: Referring Provider: Fenton Malling Treating Provider/Extender: Melburn Hake, HOYT Weeks in Treatment: 1 Verbal / Phone Orders: No Diagnosis Coding ICD-10 Coding Code Description E11.621 Type 2 diabetes mellitus with foot ulcer L97.512 Non-pressure chronic ulcer of other part of right foot with fat layer exposed I48.20 Chronic atrial fibrillation, unspecified Wound Cleansing Wound #1 Right,Plantar Toe Second o Clean wound with Normal Saline. Anesthetic (add to Medication List) Wound #1 Right,Plantar Toe  Second o Topical Lidocaine 4% cream applied to wound bed prior to debridement (In Clinic Only). Skin Barriers/Peri-Wound Care Wound #1 Right,Plantar Toe Second o Antifungal powder-Nystatin Primary Wound Dressing Wound #1 Right,Plantar Toe Second o Mepitel One Contact layer o Silver Collagen - moistened with hydrogel Secondary Dressing Wound #1 Right,Plantar Toe Second o Conform/Kerlix o Other - Lambs wool in between toes for moisture Dressing Change Frequency Wound #1 Right,Plantar Toe Second o Change dressing every other day. Follow-up Appointments Wound #1 Right,Plantar Toe Second o Return Appointment in 1 week. Edema Control Wound #1 Right,Plantar Toe Second o Elevate legs to the level of the heart and pump ankles as often as possible Melland, Kyonna W. ([id number]) Off-Loading Wound #1 Right,Plantar Toe Second o Open toe surgical shoe with peg assist. Medications-please add to medication list. Wound #1 Right,Plantar Toe Second o Other: - Nystatin powder Electronic Signature(s) Signed: 10/18/2018 4:09:31 PM By: Army Melia Signed: 10/24/2018 9:19:49 AM By: Worthy Keeler PA-C Entered By: Army Melia on 10/17/2018 14:06:29 Yono, Aftan Viona Gilmore ([id number]) -------------------------------------------------------------------------------- Problem List Details Patient Name: Stacie Porter Date of Service: 10/17/2018 1:30 PM Medical Record Number: [id number] Patient Account Number: [id number] Date of Birth/Sex: 17-Dec-1945 (73 y.o. F) Treating RN: Montey Hora Primary Care Provider: Fenton Malling Other Clinician: Referring Provider: Fenton Malling Treating Provider/Extender: Melburn Hake, HOYT Weeks in Treatment: 1 Active Problems ICD-10 Evaluated Encounter Code Description Active Date Today Diagnosis E11.621 Type 2 diabetes mellitus with foot ulcer 10/10/2018 No Yes L97.512 Non-pressure chronic ulcer of other part of right foot with fat 10/10/2018 No  Yes layer exposed I48.20 Chronic atrial fibrillation, unspecified 10/10/2018 No Yes Inactive Problems Resolved Problems Electronic Signature(s) Signed: 10/24/2018 9:19:49 AM By: Worthy Keeler PA-C Entered By: Worthy Keeler on 10/17/2018 13:57:19 Holeman, Peni W. ([id number]) -------------------------------------------------------------------------------- Progress Note Details Patient Name: Stacie Porter Date of Service: 10/17/2018 1:30 PM Medical Record Number: [id number] Patient Account Number: [id number] Date of Birth/Sex: [birth year] (73 y.o. F) Treating RN: Montey Hora Primary Care Provider:  Fenton Malling Other Clinician: Referring Provider: Fenton Malling Treating Provider/Extender: Melburn Hake, HOYT Weeks in Treatment: 1 Subjective Chief Complaint Information obtained from Patient Right 2nd Toe Ulcer History of Present Illness (HPI) 10/10/18 patient presents today for initial evaluation our clinic concerning an ulcer that she has on her right second toe on the medial/plantar aspect. This has been present for a couple of months according to the patient. She has been seeing Dr. Vickki Muff who has recommended amputation. The patient however really was not interested in proceeding with an application surgery. For that reason she decided to potentially get a second opinion for the wound center but had wanted to speak with her primary care provider first. She subsequently had her appointment with her PCP and as a result of that conversation it was determined that she would come to the wound center for a second opinion to see if we agreed with the assessment of having pain if you take or not. The patient is really not having any significant pain although she does state that there is some discomfort. No fevers, chills, nausea, or vomiting noted at this time. She has a history of hypertension, diabetes mellitus type II, and atrial fibrillation for which she has undergone an ablation.  She seems to have a desire to want to salvage the toe if it all possible there is a deformity of this region where the second toe overlaps the first which has caused the pressure and subsequently the wound that we are currently seeing. I did review patient's x-ray as performed by Dr. Vickki Muff which resulted in some question of osseous destruction of the second toe. Nonetheless this was not definitive. 10/17/18 on evaluation today patient's toe actually appears to be doing better in my pinion. She seems to show some signs of epithelialization around the edge of the toe I do believe that she is much less callous although there was a little bit of dry skin and dressing surrounding the wound opening. I think that is just a matter of not putting as much of the Prisma all around but just put it more in the central portion of the wound that will help prevent that type of thing from occurring. In general the maceration between the toes appears to be greatly improved. Overall very pleased in this regard. Her MRI was scheduled originally for Saturday which is two days away she changed this to next Tuesday. She will have this done however before I see her back for follow-up next Thursday. Patient History Information obtained from Patient. Family History Cancer - Siblings, Diabetes - Mother, Heart Disease - Mother,Father, Hypertension - Mother,Father, Lung Disease - Siblings, Stroke - Mother, No family history of Hereditary Spherocytosis, Kidney Disease, Seizures, Thyroid Problems, Tuberculosis. Social History Never smoker, Marital Status - Married, Alcohol Use - Never, Drug Use - No History, Caffeine Use - Never. Medical History Eyes Patient has history of Cataracts - removed Denies history of Glaucoma, Optic Neuritis Ear/Nose/Mouth/Throat Denies history of Chronic sinus problems/congestion, Middle ear problems Hematologic/Lymphatic Denies history of Anemia, Hemophilia, Human Immunodeficiency Virus,  Lymphedema, Sickle Cell Disease Mongiello, Iretha W. ([id number]) Respiratory Denies history of Aspiration, Asthma, Chronic Obstructive Pulmonary Disease (COPD), Pneumothorax, Sleep Apnea, Tuberculosis Cardiovascular Patient has history of Arrhythmia - hx a fib, Hypertension Denies history of Angina, Congestive Heart Failure, Coronary Artery Disease, Deep Vein Thrombosis, Hypotension, Myocardial Infarction, Peripheral Arterial Disease, Peripheral Venous Disease, Phlebitis, Vasculitis Gastrointestinal Denies history of Cirrhosis , Colitis, Crohn s, Hepatitis A, Hepatitis B, Hepatitis C Endocrine Patient  has history of Type II Diabetes Denies history of Type I Diabetes Genitourinary Denies history of End Stage Renal Disease Immunological Denies history of Lupus Erythematosus, Raynaud s, Scleroderma Integumentary (Skin) Denies history of History of Burn, History of pressure wounds Musculoskeletal Denies history of Gout, Rheumatoid Arthritis, Osteoarthritis, Osteomyelitis Neurologic Patient has history of Neuropathy Denies history of Dementia, Quadriplegia, Paraplegia, Seizure Disorder Oncologic Denies history of Received Chemotherapy, Received Radiation Psychiatric Denies history of Anorexia/bulimia, Confinement Anxiety Review of Systems (ROS) Constitutional Symptoms (General Health) Denies complaints or symptoms of Fever, Chills. Respiratory The patient has no complaints or symptoms. Cardiovascular The patient has no complaints or symptoms. Psychiatric The patient has no complaints or symptoms. Objective Constitutional Well-nourished and well-hydrated in no acute distress. Vitals Time Taken: 1:42 PM, Height: 65 in, Weight: 226.3 lbs, BMI: 37.7, Temperature: 98.3 F, Pulse: 62 bpm, Respiratory Rate: 18 breaths/min, Blood Pressure: 131/49 mmHg. Respiratory normal breathing without difficulty. clear to auscultation bilaterally. Cardiovascular Lodato, Lydie W. ([id number]) regular  rate and rhythm with normal S1, S2. Psychiatric this patient is able to make decisions and demonstrates good insight into disease process. Alert and Oriented x 3. pleasant and cooperative. General Notes: Patient's wound bed currently shows evidence of good epithelialization around the edge of the wound without any complication. Fortunately there are no signs of infection. The maceration is also greatly improved. Overall I'm pleased with how things seem to be progressing. Integumentary (Hair, Skin) Wound #1 status is Open. Original cause of wound was Gradually Appeared. The wound is located on the Right,Plantar Toe Second. The wound measures 0.7cm length x 0.9cm width x 0.3cm depth; 0.495cm^2 area and 0.148cm^3 volume. There is tendon and Fat Layer (Subcutaneous Tissue) Exposed exposed. There is no tunneling or undermining noted. There is a medium amount of serous drainage noted. The wound margin is flat and intact. There is small (1-33%) red granulation within the wound bed. There is no necrotic tissue within the wound bed. The periwound skin appearance exhibited: Maceration, Erythema. The periwound skin appearance did not exhibit: Callus, Crepitus, Excoriation, Induration, Rash, Scarring, Dry/Scaly, Atrophie Blanche, Cyanosis, Ecchymosis, Hemosiderin Staining, Mottled, Pallor, Rubor. The surrounding wound skin color is noted with erythema which is circumferential. Periwound temperature was noted as No Abnormality. The periwound has tenderness on palpation. Assessment Active Problems ICD-10 Type 2 diabetes mellitus with foot ulcer Non-pressure chronic ulcer of other part of right foot with fat layer exposed Chronic atrial fibrillation, unspecified Plan Wound Cleansing: Wound #1 Right,Plantar Toe Second: Clean wound with Normal Saline. Anesthetic (add to Medication List): Wound #1 Right,Plantar Toe Second: Topical Lidocaine 4% cream applied to wound bed prior to debridement (In Clinic  Only). Skin Barriers/Peri-Wound Care: Wound #1 Right,Plantar Toe Second: Antifungal powder-Nystatin Primary Wound Dressing: Wound #1 Right,Plantar Toe Second: Mepitel One Contact layer Silver Collagen - moistened with hydrogel Secondary Dressing: Wound #1 Right,Plantar Toe Second: Conform/Kerlix Other - Lambs wool in between toes for moisture Bellows, Gregg W. ([id number]) Dressing Change Frequency: Wound #1 Right,Plantar Toe Second: Change dressing every other day. Follow-up Appointments: Wound #1 Right,Plantar Toe Second: Return Appointment in 1 week. Edema Control: Wound #1 Right,Plantar Toe Second: Elevate legs to the level of the heart and pump ankles as often as possible Off-Loading: Wound #1 Right,Plantar Toe Second: Open toe surgical shoe with peg assist. Medications-please add to medication list.: Wound #1 Right,Plantar Toe Second: Other: - Nystatin powder My hope is that the patient does not have anything going on as far as infection is concerned that she will  be able to proceed with getting this one to close without any need for invitation or aggressive surgical intervention. With that being said a lot depends on what the MRI shows as far as how we proceed from here treatment wise. She understands. I'm gonna recommend that we see her back for follow-up in one week see were things stand. Please see above for specific wound care orders. We will see patient for re-evaluation in 1 week(s) here in the clinic. If anything worsens or changes patient will contact our office for additional recommendations. Electronic Signature(s) Signed: 10/24/2018 9:19:49 AM By: Worthy Keeler PA-C Entered By: Worthy Keeler on 10/18/2018 10:20:04 Allcorn, Kameelah Viona Gilmore ([id number]) -------------------------------------------------------------------------------- ROS/PFSH Details Patient Name: Stacie Porter Date of Service: 10/17/2018 1:30 PM Medical Record Number: [id number] Patient Account  Number: [id number] Date of Birth/Sex: 04/22/1946 (73 y.o. F) Treating RN: Montey Hora Primary Care Provider: Fenton Malling Other Clinician: Referring Provider: Fenton Malling Treating Provider/Extender: Melburn Hake, HOYT Weeks in Treatment: 1 Information Obtained From Patient Wound History Do you currently have one or more open woundso Yes How many open wounds do you currently haveo 1 Approximately how long have you had your woundso 2 months How have you been treating your wound(s) until nowo neosporin Has your wound(s) ever healed and then re-openedo No Have you had any lab work done in the past montho No Have you tested positive for an antibiotic resistant organism (MRSA, VRE)o No Have you tested positive for osteomyelitis (bone infection)o No Have you had any tests for circulation on your legso No Have you had other problems associated with your woundso Swelling Constitutional Symptoms (General Health) Complaints and Symptoms: Negative for: Fever; Chills Eyes Medical History: Positive for: Cataracts - removed Negative for: Glaucoma; Optic Neuritis Ear/Nose/Mouth/Throat Medical History: Negative for: Chronic sinus problems/congestion; Middle ear problems Hematologic/Lymphatic Medical History: Negative for: Anemia; Hemophilia; Human Immunodeficiency Virus; Lymphedema; Sickle Cell Disease Respiratory Complaints and Symptoms: No Complaints or Symptoms Medical History: Negative for: Aspiration; Asthma; Chronic Obstructive Pulmonary Disease (COPD); Pneumothorax; Sleep Apnea; Tuberculosis Cardiovascular Complaints and Symptoms: No Complaints or Symptoms Easterly, Payslie W. ([id number]) Medical History: Positive for: Arrhythmia - hx a fib; Hypertension Negative for: Angina; Congestive Heart Failure; Coronary Artery Disease; Deep Vein Thrombosis; Hypotension; Myocardial Infarction; Peripheral Arterial Disease; Peripheral Venous Disease; Phlebitis;  Vasculitis Gastrointestinal Medical History: Negative for: Cirrhosis ; Colitis; Crohnos; Hepatitis A; Hepatitis B; Hepatitis C Endocrine Medical History: Positive for: Type II Diabetes Negative for: Type I Diabetes Treated with: Oral agents Genitourinary Medical History: Negative for: End Stage Renal Disease Immunological Medical History: Negative for: Lupus Erythematosus; Raynaudos; Scleroderma Integumentary (Skin) Medical History: Negative for: History of Burn; History of pressure wounds Musculoskeletal Medical History: Negative for: Gout; Rheumatoid Arthritis; Osteoarthritis; Osteomyelitis Neurologic Medical History: Positive for: Neuropathy Negative for: Dementia; Quadriplegia; Paraplegia; Seizure Disorder Oncologic Medical History: Negative for: Received Chemotherapy; Received Radiation Psychiatric Complaints and Symptoms: No Complaints or Symptoms Medical History: Negative for: Anorexia/bulimia; Confinement Anxiety HBO Extended History Items Haen, Lizvet W. ([id number]) Eyes: Cataracts Immunizations Pneumococcal Vaccine: Received Pneumococcal Vaccination: Yes Immunization Notes: up to date Implantable Devices Family and Social History Cancer: Yes - Siblings; Diabetes: Yes - Mother; Heart Disease: Yes - Mother,Father; Hereditary Spherocytosis: No; Hypertension: Yes - Mother,Father; Kidney Disease: No; Lung Disease: Yes - Siblings; Seizures: No; Stroke: Yes - Mother; Thyroid Problems: No; Tuberculosis: No; Never smoker; Marital Status - Married; Alcohol Use: Never; Drug Use: No History; Caffeine Use: Never; Financial Concerns: No; Food, Clothing or Shelter Needs: No; Support  System Lacking: No; Transportation Concerns: No; Advanced Directives: Yes (Not Provided); Patient does not want information on Advanced Directives; Living Will: Yes (Not Provided); Medical Power of Attorney: Yes - spouse (Not Provided) Physician Affirmation I have reviewed and agree with the  above information. Electronic Signature(s) Signed: 10/18/2018 4:37:43 PM By: Montey Hora Signed: 10/24/2018 9:19:49 AM By: Worthy Keeler PA-C Entered By: Worthy Keeler on 10/18/2018 10:19:25 Karwowski, Pierina Viona Gilmore ([id number]) -------------------------------------------------------------------------------- SuperBill Details Patient Name: Stacie Porter Date of Service: 10/17/2018 Medical Record Number: [id number] Patient Account Number: [id number] Date of Birth/Sex: 08-02-1946 (73 y.o. F) Treating RN: Montey Hora Primary Care Provider: Fenton Malling Other Clinician: Referring Provider: Fenton Malling Treating Provider/Extender: Melburn Hake, HOYT Weeks in Treatment: 1 Diagnosis Coding ICD-10 Codes Code Description E11.621 Type 2 diabetes mellitus with foot ulcer L97.512 Non-pressure chronic ulcer of other part of right foot with fat layer exposed I48.20 Chronic atrial fibrillation, unspecified Facility Procedures CPT4 Code: [id number] Description: [phone] - WOUND CARE VISIT-LEV 2 EST PT Modifier: Quantity: 1 Physician Procedures CPT4 Code: [id number] Description: 21624 - WC PHYS LEVEL 4 - EST PT ICD-10 Diagnosis Description E11.621 Type 2 diabetes mellitus with foot ulcer L97.512 Non-pressure chronic ulcer of other part of right foot with fa I48.20 Chronic atrial fibrillation, unspecified Modifier: t layer exposed Quantity: 1 Electronic Signature(s) Signed: 10/24/2018 9:19:49 AM By: Worthy Keeler PA-C Entered By: Worthy Keeler on 10/17/2018 23:52:04

## 2018-10-25 NOTE — Telephone Encounter (Signed)
Pt has been diagnosed as diabatic.  Pt wants to know if there is a dietitian that she can go to to help with her diet and diabetes.  Please call pt back and advise.  Thanks, [employer]

## 2018-10-25 NOTE — Telephone Encounter (Signed)
Yes I can refer her to the diabetic education and medical nutrition.

## 2018-10-25 NOTE — Progress Notes (Signed)
LENER, VENTRESCA ([id number]) Visit Report for 10/17/2018 Arrival Information Details Patient Name: Stacie Porter, Stacie Porter Date of Service: 10/17/2018 1:30 PM Medical Record Number: [id number] Patient Account Number: [id number] Date of Birth/Sex: 06/16/1946 (73 y.o. F) Treating RN: Harold Barban Primary Care Taralee Marcus: Fenton Malling Other Clinician: Referring Loys Shugars: Fenton Malling Treating Woodrow Dulski/Extender: Melburn Hake, HOYT Weeks in Treatment: 1 Visit Information History Since Last Visit Added or deleted any medications: No Patient Arrived: Ambulatory Any new allergies or adverse reactions: No Arrival Time: 13:39 Had a fall or experienced change in No Accompanied By: self activities of daily living that may affect Transfer Assistance: None risk of falls: Patient Identification Verified: Yes Signs or symptoms of abuse/neglect since last visito No Secondary Verification Process Completed: Yes Hospitalized since last visit: No Implantable device outside of the clinic excluding No cellular tissue based products placed in the center since last visit: Has Dressing in Place as Prescribed: Yes Pain Present Now: No Electronic Signature(s) Signed: 10/22/2018 11:13:25 AM By: Harold Barban Entered By: Harold Barban on 10/17/2018 13:40:37 Cen, Milca Viona Gilmore ([id number]) -------------------------------------------------------------------------------- Clinic Level of Care Assessment Details Patient Name: Stacie Porter Date of Service: 10/17/2018 1:30 PM Medical Record Number: [id number] Patient Account Number: [id number] Date of Birth/Sex: 21-Jun-1946 (73 y.o. F) Treating RN: Army Melia Primary Care Haruka Kowaleski: Fenton Malling Other Clinician: Referring Jayron Maqueda: Fenton Malling Treating Horton Ellithorpe/Extender: Melburn Hake, HOYT Weeks in Treatment: 1 Clinic Level of Care Assessment Items TOOL 4 Quantity Score []  - Use when only an EandM is performed on FOLLOW-UP visit 0 ASSESSMENTS -  Nursing Assessment / Reassessment []  - Reassessment of Co-morbidities (includes updates in patient status) 0 []  - 0 Reassessment of Adherence to Treatment Plan ASSESSMENTS - Wound and Skin Assessment / Reassessment X - Simple Wound Assessment / Reassessment - one wound 1 5 []  - 0 Complex Wound Assessment / Reassessment - multiple wounds []  - 0 Dermatologic / Skin Assessment (not related to wound area) ASSESSMENTS - Focused Assessment []  - Circumferential Edema Measurements - multi extremities 0 []  - 0 Nutritional Assessment / Counseling / Intervention []  - 0 Lower Extremity Assessment (monofilament, tuning fork, pulses) []  - 0 Peripheral Arterial Disease Assessment (using hand held doppler) ASSESSMENTS - Ostomy and/or Continence Assessment and Care []  - Incontinence Assessment and Management 0 []  - 0 Ostomy Care Assessment and Management (repouching, etc.) PROCESS - Coordination of Care X - Simple Patient / Family Education for ongoing care 1 15 []  - 0 Complex (extensive) Patient / Family Education for ongoing care []  - 0 Staff obtains Programmer, systems, Records, Test Results / Process Orders []  - 0 Staff telephones HHA, Nursing Homes / Clarify orders / etc []  - 0 Routine Transfer to another Facility (non-emergent condition) []  - 0 Routine Hospital Admission (non-emergent condition) []  - 0 New Admissions / Biomedical engineer / Ordering NPWT, Apligraf, etc. []  - 0 Emergency Hospital Admission (emergent condition) X- 1 10 Simple Discharge Coordination Lovett, Jaylan W. ([id number]) []  - 0 Complex (extensive) Discharge Coordination PROCESS - Special Needs []  - Pediatric / Minor Patient Management 0 []  - 0 Isolation Patient Management []  - 0 Hearing / Language / Visual special needs []  - 0 Assessment of Community assistance (transportation, D/C planning, etc.) []  - 0 Additional assistance / Altered mentation []  - 0 Support Surface(s) Assessment (bed, cushion, seat,  etc.) INTERVENTIONS - Wound Cleansing / Measurement X - Simple Wound Cleansing - one wound 1 5 []  - 0 Complex Wound Cleansing - multiple wounds X- 1 5 Wound Imaging (photographs -  any number of wounds) []  - 0 Wound Tracing (instead of photographs) X- 1 5 Simple Wound Measurement - one wound []  - 0 Complex Wound Measurement - multiple wounds INTERVENTIONS - Wound Dressings X - Small Wound Dressing one or multiple wounds 1 10 []  - 0 Medium Wound Dressing one or multiple wounds []  - 0 Large Wound Dressing one or multiple wounds []  - 0 Application of Medications - topical []  - 0 Application of Medications - injection INTERVENTIONS - Miscellaneous []  - External ear exam 0 []  - 0 Specimen Collection (cultures, biopsies, blood, body fluids, etc.) []  - 0 Specimen(s) / Culture(s) sent or taken to Lab for analysis []  - 0 Patient Transfer (multiple staff / Civil Service fast streamer / Similar devices) []  - 0 Simple Staple / Suture removal (25 or less) []  - 0 Complex Staple / Suture removal (26 or more) []  - 0 Hypo / Hyperglycemic Management (close monitor of Blood Glucose) []  - 0 Ankle / Brachial Index (ABI) - do not check if billed separately X- 1 5 Vital Signs Angelucci, Tylie W. ([id number]) Has the patient been seen at the hospital within the last three years: Yes Total Score: 60 Level Of Care: New/Established - Level 2 Electronic Signature(s) Signed: 10/18/2018 4:09:31 PM By: Army Melia Entered By: Army Melia on 10/17/2018 14:08:03 Morreale, Grisell Viona Gilmore ([id number]) -------------------------------------------------------------------------------- Encounter Discharge Information Details Patient Name: Stacie Porter Date of Service: 10/17/2018 1:30 PM Medical Record Number: [id number] Patient Account Number: [id number] Date of Birth/Sex: [birth year] (73 y.o. F) Treating RN: Army Melia Primary Care Chera Slivka: Fenton Malling Other Clinician: Referring Dorsie Sethi: Fenton Malling Treating  Theo Reither/Extender: Melburn Hake, HOYT Weeks in Treatment: 1 Encounter Discharge Information Items Discharge Condition: Stable Ambulatory Status: Ambulatory Discharge Destination: Home Transportation: Private Auto Accompanied By: self Schedule Follow-up Appointment: Yes Clinical Summary of Care: Electronic Signature(s) Signed: 10/18/2018 4:09:31 PM By: Army Melia Entered By: Army Melia on 10/17/2018 14:11:14 Brisky, Henryetta Viona Gilmore ([id number]) -------------------------------------------------------------------------------- Lower Extremity Assessment Details Patient Name: Stacie Porter Date of Service: 10/17/2018 1:30 PM Medical Record Number: [id number] Patient Account Number: [id number] Date of Birth/Sex: November 16, 1945 (73 y.o. F) Treating RN: Harold Barban Primary Care Tyller Bowlby: Fenton Malling Other Clinician: Referring Emanuella Nickle: Fenton Malling Treating Jaron Czarnecki/Extender: Melburn Hake, HOYT Weeks in Treatment: 1 Electronic Signature(s) Signed: 10/22/2018 11:13:25 AM By: Harold Barban Entered By: Harold Barban on 10/17/2018 13:51:41 Kam, Lysbeth W. ([id number]) -------------------------------------------------------------------------------- Multi Wound Chart Details Patient Name: Stacie Porter Date of Service: 10/17/2018 1:30 PM Medical Record Number: [id number] Patient Account Number: [id number] Date of Birth/Sex: [birth year] (73 y.o. F) Treating RN: Army Melia Primary Care Jerri Glauser: Fenton Malling Other Clinician: Referring Cinch Ormond: Fenton Malling Treating Adalia Pettis/Extender: Melburn Hake, HOYT Weeks in Treatment: 1 Vital Signs Height(in): 65 Pulse(bpm): 41 Weight(lbs): 226.3 Blood Pressure(mmHg): 131/49 Body Mass Index(BMI): 38 Temperature(F): 98.3 Respiratory Rate 18 (breaths/min): Photos: [1:No Photos] [N/A:N/A] Wound Location: [1:Right Toe Second - Plantar] [N/A:N/A] Wounding Event: [1:Gradually Appeared] [N/A:N/A] Primary Etiology: [1:Diabetic Wound/Ulcer  of the Lower Extremity] [N/A:N/A] Comorbid History: [1:Cataracts, Arrhythmia, Hypertension, Type II Diabetes, Neuropathy] [N/A:N/A] Date Acquired: [1:08/09/2018] [N/A:N/A] Weeks of Treatment: [1:1] [N/A:N/A] Wound Status: [1:Open] [N/A:N/A] Pending Amputation on [1:Yes] [N/A:N/A] Presentation: Measurements L x W x D [1:0.7x0.9x0.3] [N/A:N/A] (cm) Area (cm) : [1:0.495] [N/A:N/A] Volume (cm) : [1:0.148] [N/A:N/A] % Reduction in Area: [1:21.20%] [N/A:N/A] % Reduction in Volume: [1:21.30%] [N/A:N/A] Classification: [1:Grade 2] [N/A:N/A] Exudate Amount: [1:Medium] [N/A:N/A] Exudate Type: [1:Serous] [N/A:N/A] Exudate Color: [1:amber] [N/A:N/A] Wound Margin: [1:Flat and Intact] [N/A:N/A] Granulation Amount: [1:Small (1-33%)] [N/A:N/A] Granulation Quality: [1:Red] [  N/A:N/A] Necrotic Amount: [1:None Present (0%)] [N/A:N/A] Exposed Structures: [1:Fat Layer (Subcutaneous Tissue) Exposed: Yes Tendon: Yes Fascia: No Muscle: No Joint: No Bone: No] [N/A:N/A] Epithelialization: [1:None] [N/A:N/A] Periwound Skin Texture: [1:Excoriation: No Induration: No] [N/A:N/A] Callus: No Crepitus: No Rash: No Scarring: No Periwound Skin Moisture: Maceration: Yes N/A N/A Dry/Scaly: No Periwound Skin Color: Erythema: Yes N/A N/A Atrophie Blanche: No Cyanosis: No Ecchymosis: No Hemosiderin Staining: No Mottled: No Pallor: No Rubor: No Erythema Location: Circumferential N/A N/A Temperature: No Abnormality N/A N/A Tenderness on Palpation: Yes N/A N/A Wound Preparation: Ulcer Cleansing: N/A N/A Rinsed/Irrigated with Saline Topical Anesthetic Applied: Other: lidocaine 4% Treatment Notes Electronic Signature(s) Signed: 10/18/2018 4:09:31 PM By: Army Melia Entered By: Army Melia on 10/17/2018 14:00:15 Persaud, Mattelyn Viona Gilmore ([id number]) -------------------------------------------------------------------------------- [place] Details Patient Name: Stacie Porter Date of  Service: 10/17/2018 1:30 PM Medical Record Number: [id number] Patient Account Number: [id number] Date of Birth/Sex: 04-04-1946 (73 y.o. F) Treating RN: Army Melia Primary Care Yasmine Kilbourne: Fenton Malling Other Clinician: Referring Bless Belshe: Fenton Malling Treating Valentine Kuechle/Extender: Melburn Hake, HOYT Weeks in Treatment: 1 Active Inactive Medication Nursing Diagnoses: Knowledge deficit related to medication safety: actual or potential Goals: Patient/caregiver will demonstrate understanding of all current medications Date Initiated: 10/10/2018 Target Resolution Date: 11/08/2018 Goal Status: Active Patient/caregiver will demonstrate understanding of new oral/IV medications prescribed at the [facility] (topical prescriptions are covered under the skin breakdown problem) Date Initiated: 10/10/2018 Target Resolution Date: 10/27/2018 Goal Status: Active Interventions: Assess for medication contraindications each visit where new medications are prescribed Provide education on medication safety Treatment Activities: Education provided on Medication Safety : 10/10/2018 Notes: Nutrition Nursing Diagnoses: Impaired glucose control: actual or potential Goals: Patient/caregiver verbalizes understanding of need to maintain therapeutic glucose control per primary care physician Date Initiated: 10/10/2018 Target Resolution Date: 11/08/2018 Goal Status: Active Interventions: Provide education on elevated blood sugars and impact on wound healing Notes: Orientation to the Wound Care Program Nursing Diagnoses: Knowledge deficit related to the wound healing center program Fangman, Courtnay W. ([id number]) Goals: Patient/caregiver will verbalize understanding of the [facility] Date Initiated: 10/10/2018 Target Resolution Date: 10/10/2018 Goal Status: Active Interventions: Provide education on orientation to the wound center Notes: Wound/Skin Impairment Nursing Diagnoses: Impaired tissue  integrity Goals: Patient/caregiver will verbalize understanding of skin care regimen Date Initiated: 10/10/2018 Target Resolution Date: 11/08/2018 Goal Status: Active Ulcer/skin breakdown will have a volume reduction of 30% by week 4 Date Initiated: 10/10/2018 Target Resolution Date: 11/08/2018 Goal Status: Active Interventions: Assess ulceration(s) every visit Treatment Activities: Referred to DME Leroy Pettway for dressing supplies : 10/10/2018 Skin care regimen initiated : 10/10/2018 Topical wound management initiated : 10/10/2018 Notes: Electronic Signature(s) Signed: 10/18/2018 4:09:31 PM By: Army Melia Entered By: Army Melia on 10/17/2018 14:00:07 Younkin, Lanique Viona Gilmore ([id number]) -------------------------------------------------------------------------------- Pain Assessment Details Patient Name: Stacie Porter Date of Service: 10/17/2018 1:30 PM Medical Record Number: [id number] Patient Account Number: [id number] Date of Birth/Sex: 06/05/1946 (73 y.o. F) Treating RN: Harold Barban Primary Care Kelie Gainey: Fenton Malling Other Clinician: Referring Braylynn Ghan: Fenton Malling Treating Hashim Eichhorst/Extender: Melburn Hake, HOYT Weeks in Treatment: 1 Active Problems Location of Pain Severity and Description of Pain Patient Has Paino No Site Locations Pain Management and Medication Current Pain Management: Electronic Signature(s) Signed: 10/22/2018 11:13:25 AM By: Harold Barban Entered By: Harold Barban on 10/17/2018 13:40:45 Fanfan, Telma Viona Gilmore ([id number]) -------------------------------------------------------------------------------- Patient/Caregiver Education Details Patient Name: Stacie Porter Date of Service: 10/17/2018 1:30 PM Medical Record Number: [id number] Patient Account Number: [id number] Date of Birth/Gender: 1946/01/16 (73 y.o.  F) Treating RN: Army Melia Primary Care Physician: Fenton Malling Other Clinician: Referring Physician: Fenton Malling Treating  Physician/Extender: Sharalyn Ink in Treatment: 1 Education Assessment Education Provided To: Patient Education Topics Provided Wound/Skin Impairment: Handouts: Caring for Your Ulcer, Other: continue wound care as prescribed Methods: Demonstration, Explain/Verbal Electronic Signature(s) Signed: 10/18/2018 4:09:31 PM By: Army Melia Entered By: Army Melia on 10/17/2018 14:08:36 [place]field, Anicka W. ([id number]) -------------------------------------------------------------------------------- Wound Assessment Details Patient Name: Stacie Porter Date of Service: 10/17/2018 1:30 PM Medical Record Number: [id number] Patient Account Number: [id number] Date of Birth/Sex: 08/21/1946 (73 y.o. F) Treating RN: Harold Barban Primary Care Glade Strausser: Fenton Malling Other Clinician: Referring Saphia Vanderford: Fenton Malling Treating Vencent Hauschild/Extender: Melburn Hake, HOYT Weeks in Treatment: 1 Wound Status Wound Number: 1 Primary Diabetic Wound/Ulcer of the Lower Extremity Etiology: Wound Location: Right Toe Second - Plantar Wound Status: Open Wounding Event: Gradually Appeared Comorbid Cataracts, Arrhythmia, Hypertension, Type II Date Acquired: 08/09/2018 History: Diabetes, Neuropathy Weeks Of Treatment: 1 Clustered Wound: No Pending Amputation On Presentation Photos Photo Uploaded By: Harold Barban on 10/17/2018 14:51:12 Wound Measurements Length: (cm) 0.7 Width: (cm) 0.9 Depth: (cm) 0.3 Area: (cm) 0.495 Volume: (cm) 0.148 % Reduction in Area: 21.2% % Reduction in Volume: 21.3% Epithelialization: None Tunneling: No Undermining: No Wound Description Classification: Grade 2 Wound Margin: Flat and Intact Exudate Amount: Medium Exudate Type: Serous Exudate Color: amber Foul Odor After Cleansing: No Slough/Fibrino No Wound Bed Granulation Amount: Small (1-33%) Exposed Structure Granulation Quality: Red Fascia Exposed: No Necrotic Amount: None Present (0%) Fat Layer  (Subcutaneous Tissue) Exposed: Yes Tendon Exposed: Yes Muscle Exposed: No Joint Exposed: No Bone Exposed: No Periwound Skin Texture Mcmenamin, Aloise W. ([id number]) Texture Color No Abnormalities Noted: No No Abnormalities Noted: No Callus: No Atrophie Blanche: No Crepitus: No Cyanosis: No Excoriation: No Ecchymosis: No Induration: No Erythema: Yes Rash: No Erythema Location: Circumferential Scarring: No Hemosiderin Staining: No Mottled: No Moisture Pallor: No No Abnormalities Noted: No Rubor: No Dry / Scaly: No Maceration: Yes Temperature / Pain Temperature: No Abnormality Tenderness on Palpation: Yes Wound Preparation Ulcer Cleansing: Rinsed/Irrigated with Saline Topical Anesthetic Applied: Other: lidocaine 4%, Electronic Signature(s) Signed: 10/22/2018 11:13:25 AM By: Harold Barban Entered By: Harold Barban on 10/17/2018 13:50:40 Ravan, Wende Viona Gilmore ([id number]) -------------------------------------------------------------------------------- Vitals Details Patient Name: Stacie Porter Date of Service: 10/17/2018 1:30 PM Medical Record Number: [id number] Patient Account Number: [id number] Date of Birth/Sex: Aug 14, 1946 (73 y.o. F) Treating RN: Harold Barban Primary Care Jameyah Fennewald: Fenton Malling Other Clinician: Referring Thierno Hun: Fenton Malling Treating Glee Lashomb/Extender: Melburn Hake, HOYT Weeks in Treatment: 1 Vital Signs Time Taken: 13:42 Temperature (F): 98.3 Height (in): 65 Pulse (bpm): 62 Weight (lbs): 226.3 Respiratory Rate (breaths/min): 18 Body Mass Index (BMI): 37.7 Blood Pressure (mmHg): 131/49 Reference Range: 80 - 120 mg / dl Electronic Signature(s) Signed: 10/22/2018 11:13:25 AM By: Harold Barban Entered By: Harold Barban on 10/17/2018 13:43:33

## 2018-10-25 NOTE — Progress Notes (Signed)
Stacie Porter ([id number]) Visit Report for 10/24/2018 Chief Complaint Document Details Patient Name: Stacie Porter, Stacie Porter Date of Service: 10/24/2018 8:15 AM Medical Record Number: [id number] Patient Account Number: [id number] Date of Birth/Sex: Dec 30, 1945 (73 y.o. F) Treating RN: Army Melia Primary Care Provider: Fenton Malling Other Clinician: Referring Provider: Fenton Malling Treating Provider/Extender: Melburn Hake, Cy Bresee Weeks in Treatment: 2 Information Obtained from: Patient Chief Complaint Right 2nd Toe Ulcer Electronic Signature(s) Signed: 10/24/2018 10:59:30 AM By: Worthy Keeler PA-C Entered By: Worthy Keeler on 10/24/2018 08:27:57 Menees, Royelle W. ([id number]) -------------------------------------------------------------------------------- HPI Details Patient Name: Stacie Porter Date of Service: 10/24/2018 8:15 AM Medical Record Number: [id number] Patient Account Number: [id number] Date of Birth/Sex: May 27, 1946 (73 y.o. F) Treating RN: Army Melia Primary Care Provider: Fenton Malling Other Clinician: Referring Provider: Fenton Malling Treating Provider/Extender: Melburn Hake, Wadsworth Skolnick Weeks in Treatment: 2 History of Present Illness HPI Description: 10/10/18 patient presents today for initial evaluation our clinic concerning an ulcer that she has on her right second toe on the medial/plantar aspect. This has been present for a couple of months according to the patient. She has been seeing Dr. Vickki Muff who has recommended amputation. The patient however really was not interested in proceeding with an application surgery. For that reason she decided to potentially get a second opinion for the wound center but had wanted to speak with her primary care provider first. She subsequently had her appointment with her PCP and as a result of that conversation it was determined that she would come to the wound center for a second opinion to see if we agreed with the assessment of  having pain if you take or not. The patient is really not having any significant pain although she does state that there is some discomfort. No fevers, chills, nausea, or vomiting noted at this time. She has a history of hypertension, diabetes mellitus type II, and atrial fibrillation for which she has undergone an ablation. She seems to have a desire to want to salvage the toe if it all possible there is a deformity of this region where the second toe overlaps the first which has caused the pressure and subsequently the wound that we are currently seeing. I did review patient's x-ray as performed by Dr. Vickki Muff which resulted in some question of osseous destruction of the second toe. Nonetheless this was not definitive. 10/17/18 on evaluation today patient's toe actually appears to be doing better in my pinion. She seems to show some signs of epithelialization around the edge of the toe I do believe that she is much less callous although there was a little bit of dry skin and dressing surrounding the wound opening. I think that is just a matter of not putting as much of the Prisma all around but just put it more in the central portion of the wound that will help prevent that type of thing from occurring. In general the maceration between the toes appears to be greatly improved. Overall very pleased in this regard. Her MRI was scheduled originally for Saturday which is two days away she changed this to next Tuesday. She will have this done however before I see her back for follow-up next Thursday. 10/24/18 on evaluation today patient presents for follow-up concerning her right second toe ulcer. On evaluation today patient's wound bed actually showed signs of continued exposure of the tenant although there does not appear to be any drying out a breakdown of the tenant at this time which is good news.  I did review her MRI which was obtained two days ago which does show that she has osteomyelitis of the  entire toe fortunately this does not seem to proceed into the foot proper. The good news is she's not having any significant pain which is excellent. The bad news is obviously there is infection she is going to need to continue antibiotics and at minimum we probably need to go ahead and see about making a referral to infectious disease. She also has to make up her mind as far as whether or not she would like to see a surgeon for the possibility of amputation which is another option especially considering the deformity of the second toe which wraps onto the dorsal surface of the great toe which is what appears to have caused the callous and subsequently this wound to begin with. Fortunately there is no evidence of systemic infection at this point. She does seem to be tolerating the doxycycline without complication. Electronic Signature(s) Signed: 10/24/2018 10:59:30 AM By: Worthy Keeler PA-C Entered By: Worthy Keeler on 10/24/2018 09:26:56 Luscher, Ellyce Viona Gilmore ([id number]) -------------------------------------------------------------------------------- Physical Exam Details Patient Name: Stacie Porter Date of Service: 10/24/2018 8:15 AM Medical Record Number: [id number] Patient Account Number: [id number] Date of Birth/Sex: Jun 07, 1946 (73 y.o. F) Treating RN: Army Melia Primary Care Provider: Fenton Malling Other Clinician: Referring Provider: Fenton Malling Treating Provider/Extender: Melburn Hake, Leveon Pelzer Weeks in Treatment: 2 Constitutional Well-nourished and well-hydrated in no acute distress. Respiratory normal breathing without difficulty. clear to auscultation bilaterally. Cardiovascular regular rate and rhythm with normal S1, S2. Psychiatric this patient is able to make decisions and demonstrates good insight into disease process. Alert and Oriented x 3. pleasant and cooperative. Notes Patient's wound currently shows evidence of tendon exposure in the wound surface and again  there may be a slight amount of epithelialization around the edge of the wound but nothing too significant at this point. No sharp debridement was necessary nor performed today. Fortunately the surrounding maceration of the great toe and tissue appears to have improved significantly there's no signs of any skin breakdown of these locations which is also excellent news. I think the nystatin powder has been and is continuing to be of benefit. Electronic Signature(s) Signed: 10/24/2018 10:59:30 AM By: Worthy Keeler PA-C Entered By: Worthy Keeler on 10/24/2018 09:27:55 Delcastillo, Brendy Viona Gilmore ([id number]) -------------------------------------------------------------------------------- Physician Orders Details Patient Name: Stacie Porter Date of Service: 10/24/2018 8:15 AM Medical Record Number: [id number] Patient Account Number: [id number] Date of Birth/Sex: 07/25/1946 (73 y.o. F) Treating RN: Army Melia Primary Care Provider: Fenton Malling Other Clinician: Referring Provider: Fenton Malling Treating Provider/Extender: Melburn Hake, Jerren Flinchbaugh Weeks in Treatment: 2 Verbal / Phone Orders: No Diagnosis Coding ICD-10 Coding Code Description E11.621 Type 2 diabetes mellitus with foot ulcer L97.512 Non-pressure chronic ulcer of other part of right foot with fat layer exposed I48.20 Chronic atrial fibrillation, unspecified Wound Cleansing Wound #1 Right,Plantar Toe Second o Clean wound with Normal Saline. Anesthetic (add to Medication List) Wound #1 Right,Plantar Toe Second o Topical Lidocaine 4% cream applied to wound bed prior to debridement (In Clinic Only). Skin Barriers/Peri-Wound Care Wound #1 Right,Plantar Toe Second o Antifungal powder-Nystatin Primary Wound Dressing Wound #1 Right,Plantar Toe Second o Mepitel One Contact layer o Silver Collagen - moistened with hydrogel Secondary Dressing Wound #1 Right,Plantar Toe Second o Conform/Kerlix o Other - Lambs wool in  between toes for moisture Dressing Change Frequency Wound #1 Right,Plantar Toe Second o Change dressing every other day. Follow-up  Appointments Wound #1 Right,Plantar Toe Second o Return Appointment in 1 week. Edema Control Wound #1 Right,Plantar Toe Second o Elevate legs to the level of the heart and pump ankles as often as possible Tomeo, Leomia W. ([id number]) Off-Loading Wound #1 Right,Plantar Toe Second o Open toe surgical shoe with peg assist. Medications-please add to medication list. Wound #1 Right,Plantar Toe Second o Other: - Nystatin powder Consults o Infectious Disease - Right second toe, osteomyelitis Patient Medications Allergies: ibuprofen, meloxicam, Multaq Notifications Medication Indication Start End doxycycline hyclate 10/24/2018 DOSE 1 - oral 100 mg capsule - 1 capsule oral take 2 times a day for 15 days Electronic Signature(s) Signed: 10/24/2018 9:30:27 AM By: Worthy Keeler PA-C Entered By: Worthy Keeler on 10/24/2018 09:30:26 Neiswonger, Aayla W. ([id number]) -------------------------------------------------------------------------------- Problem List Details Patient Name: Stacie Porter Date of Service: 10/24/2018 8:15 AM Medical Record Number: [id number] Patient Account Number: [id number] Date of Birth/Sex: May 23, 1946 (73 y.o. F) Treating RN: Army Melia Primary Care Provider: Fenton Malling Other Clinician: Referring Provider: Fenton Malling Treating Provider/Extender: Melburn Hake, Mili Piltz Weeks in Treatment: 2 Active Problems ICD-10 Evaluated Encounter Code Description Active Date Today Diagnosis E11.621 Type 2 diabetes mellitus with foot ulcer 10/10/2018 No Yes L97.512 Non-pressure chronic ulcer of other part of right foot with fat 10/10/2018 No Yes layer exposed M86.671 Other chronic osteomyelitis, right ankle and foot 10/24/2018 No Yes I48.20 Chronic atrial fibrillation, unspecified 10/10/2018 No Yes Inactive Problems Resolved  Problems Electronic Signature(s) Signed: 10/24/2018 10:59:30 AM By: Worthy Keeler PA-C Entered By: Worthy Keeler on 10/24/2018 09:31:29 Ramser, Sharice Viona Gilmore ([id number]) -------------------------------------------------------------------------------- Progress Note Details Patient Name: Stacie Porter Date of Service: 10/24/2018 8:15 AM Medical Record Number: [id number] Patient Account Number: [id number] Date of Birth/Sex: 15-Jan-1946 (73 y.o. F) Treating RN: Army Melia Primary Care Provider: Fenton Malling Other Clinician: Referring Provider: Fenton Malling Treating Provider/Extender: Melburn Hake, Geronimo Diliberto Weeks in Treatment: 2 Subjective Chief Complaint Information obtained from Patient Right 2nd Toe Ulcer History of Present Illness (HPI) 10/10/18 patient presents today for initial evaluation our clinic concerning an ulcer that she has on her right second toe on the medial/plantar aspect. This has been present for a couple of months according to the patient. She has been seeing Dr. Vickki Muff who has recommended amputation. The patient however really was not interested in proceeding with an application surgery. For that reason she decided to potentially get a second opinion for the wound center but had wanted to speak with her primary care provider first. She subsequently had her appointment with her PCP and as a result of that conversation it was determined that she would come to the wound center for a second opinion to see if we agreed with the assessment of having pain if you take or not. The patient is really not having any significant pain although she does state that there is some discomfort. No fevers, chills, nausea, or vomiting noted at this time. She has a history of hypertension, diabetes mellitus type II, and atrial fibrillation for which she has undergone an ablation. She seems to have a desire to want to salvage the toe if it all possible there is a deformity of this region where  the second toe overlaps the first which has caused the pressure and subsequently the wound that we are currently seeing. I did review patient's x-ray as performed by Dr. Vickki Muff which resulted in some question of osseous destruction of the second toe. Nonetheless this was not definitive. 10/17/18 on evaluation today patient's  toe actually appears to be doing better in my pinion. She seems to show some signs of epithelialization around the edge of the toe I do believe that she is much less callous although there was a little bit of dry skin and dressing surrounding the wound opening. I think that is just a matter of not putting as much of the Prisma all around but just put it more in the central portion of the wound that will help prevent that type of thing from occurring. In general the maceration between the toes appears to be greatly improved. Overall very pleased in this regard. Her MRI was scheduled originally for Saturday which is two days away she changed this to next Tuesday. She will have this done however before I see her back for follow-up next Thursday. 10/24/18 on evaluation today patient presents for follow-up concerning her right second toe ulcer. On evaluation today patient's wound bed actually showed signs of continued exposure of the tenant although there does not appear to be any drying out a breakdown of the tenant at this time which is good news. I did review her MRI which was obtained two days ago which does show that she has osteomyelitis of the entire toe fortunately this does not seem to proceed into the foot proper. The good news is she's not having any significant pain which is excellent. The bad news is obviously there is infection she is going to need to continue antibiotics and at minimum we probably need to go ahead and see about making a referral to infectious disease. She also has to make up her mind as far as whether or not she would like to see a surgeon for the  possibility of amputation which is another option especially considering the deformity of the second toe which wraps onto the dorsal surface of the great toe which is what appears to have caused the callous and subsequently this wound to begin with. Fortunately there is no evidence of systemic infection at this point. She does seem to be tolerating the doxycycline without complication. Patient History Information obtained from Patient. Family History Cancer - Siblings, Diabetes - Mother, Heart Disease - Mother,Father, Hypertension - Mother,Father, Lung Disease - Siblings, Stroke - Mother, No family history of Hereditary Spherocytosis, Kidney Disease, Seizures, Thyroid Problems, Tuberculosis. Dahir, AZLIN ZILBERMAN ([id number]) Social History Never smoker, Marital Status - Married, Alcohol Use - Never, Drug Use - No History, Caffeine Use - Never. Medical History Eyes Patient has history of Cataracts - removed Denies history of Glaucoma, Optic Neuritis Ear/Nose/Mouth/Throat Denies history of Chronic sinus problems/congestion, Middle ear problems Hematologic/Lymphatic Denies history of Anemia, Hemophilia, Human Immunodeficiency Virus, Lymphedema, Sickle Cell Disease Respiratory Denies history of Aspiration, Asthma, Chronic Obstructive Pulmonary Disease (COPD), Pneumothorax, Sleep Apnea, Tuberculosis Cardiovascular Patient has history of Arrhythmia - hx a fib, Hypertension Denies history of Angina, Congestive Heart Failure, Coronary Artery Disease, Deep Vein Thrombosis, Hypotension, Myocardial Infarction, Peripheral Arterial Disease, Peripheral Venous Disease, Phlebitis, Vasculitis Gastrointestinal Denies history of Cirrhosis , Colitis, Crohn s, Hepatitis A, Hepatitis B, Hepatitis C Endocrine Patient has history of Type II Diabetes Denies history of Type I Diabetes Genitourinary Denies history of End Stage Renal Disease Immunological Denies history of Lupus Erythematosus, Raynaud s,  Scleroderma Integumentary (Skin) Denies history of History of Burn, History of pressure wounds Musculoskeletal Denies history of Gout, Rheumatoid Arthritis, Osteoarthritis, Osteomyelitis Neurologic Patient has history of Neuropathy Denies history of Dementia, Quadriplegia, Paraplegia, Seizure Disorder Oncologic Denies history of Received Chemotherapy, Received Radiation Psychiatric  Denies history of Anorexia/bulimia, Confinement Anxiety Review of Systems (ROS) Constitutional Symptoms (General Health) Denies complaints or symptoms of Fever, Chills. Respiratory The patient has no complaints or symptoms. Cardiovascular The patient has no complaints or symptoms. Psychiatric The patient has no complaints or symptoms. Objective Aden, Davielle W. ([id number]) Constitutional Well-nourished and well-hydrated in no acute distress. Vitals Time Taken: 8:08 AM, Height: 65 in, Weight: 226.3 lbs, BMI: 37.7, Temperature: 97.7 F, Pulse: 58 bpm, Respiratory Rate: 18 breaths/min, Blood Pressure: 130/50 mmHg. Respiratory normal breathing without difficulty. clear to auscultation bilaterally. Cardiovascular regular rate and rhythm with normal S1, S2. Psychiatric this patient is able to make decisions and demonstrates good insight into disease process. Alert and Oriented x 3. pleasant and cooperative. General Notes: Patient's wound currently shows evidence of tendon exposure in the wound surface and again there may be a slight amount of epithelialization around the edge of the wound but nothing too significant at this point. No sharp debridement was necessary nor performed today. Fortunately the surrounding maceration of the great toe and tissue appears to have improved significantly there's no signs of any skin breakdown of these locations which is also excellent news. I think the nystatin powder has been and is continuing to be of benefit. Integumentary (Hair, Skin) Wound #1 status is Open.  Original cause of wound was Gradually Appeared. The wound is located on the Right,Plantar Toe Second. The wound measures 1.4cm length x 0.9cm width x 0.2cm depth; 0.99cm^2 area and 0.198cm^3 volume. There is tendon and Fat Layer (Subcutaneous Tissue) Exposed exposed. There is no tunneling noted, however, there is undermining starting at 10:00 and ending at 11:00 with a maximum distance of 0.3cm. There is a medium amount of serous drainage noted. The wound margin is flat and intact. There is no granulation within the wound bed. There is a large (67-100%) amount of necrotic tissue within the wound bed. The periwound skin appearance exhibited: Maceration, Erythema. The periwound skin appearance did not exhibit: Callus, Crepitus, Excoriation, Induration, Rash, Scarring, Dry/Scaly, Atrophie Blanche, Cyanosis, Ecchymosis, Hemosiderin Staining, Mottled, Pallor, Rubor. The surrounding wound skin color is noted with erythema which is circumferential. Periwound temperature was noted as No Abnormality. The periwound has tenderness on palpation. 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 ([id number]) Wound #1 Right,Plantar Toe Second: Clean wound with Normal Saline. Anesthetic (add to Medication List): Wound #1 Right,Plantar Toe Second: Topical Lidocaine 4% cream applied to wound bed prior to debridement (In Clinic Only). Skin Barriers/Peri-Wound Care: Wound #1 Right,Plantar Toe Second: Antifungal powder-Nystatin Primary Wound Dressing: Wound #1 Right,Plantar Toe Second: Mepitel One Contact layer Silver Collagen - moistened with hydrogel Secondary Dressing: Wound #1 Right,Plantar Toe Second: Conform/Kerlix Other - Lambs wool in between toes for  moisture Dressing Change Frequency: Wound #1 Right,Plantar Toe Second: Change dressing every other day. Follow-up Appointments: Wound #1 Right,Plantar Toe Second: Return Appointment in 1 week. Edema Control: Wound #1 Right,Plantar Toe Second: Elevate legs to the level of the heart and pump ankles as often as possible Off-Loading: Wound #1 Right,Plantar Toe Second: Open toe surgical shoe with peg assist. Medications-please add to medication list.: Wound #1 Right,Plantar Toe Second: Other: - Nystatin powder Consults ordered were: Infectious Disease - Right second toe, osteomyelitis The following medication(s) was prescribed: doxycycline hyclate oral 100 mg capsule 1 1 capsule  oral take 2 times a day for 15 days starting 10/24/2018 My suggestion at this point for the patient was that she really needs to decide which direction she would like to go as far as treatment is concerned. I think hyperbaric's would be a possibility for her although I'm not sure that she's very interested in this just due to the time investment that it would require. With that being said I do think her other option would be for [place] to proceed with referring her to an orthopedic foot specialist for consideration of amputation but again I'm not sure that she's at the point of wanting to make that decision yet either as well. For that reason based on what I'm seeing at this point I think that the ideal thing would be for [place] to make a referral to infectious disease which we will go ahead and do for the time being. We will subsequently see were things stand at follow-up in one weeks time. I'm gonna refill the doxycycline for her which I think is appropriate until she get in with infectious disease and then will see were things stand and what she decides going forward. If anything changes in the meantime she can contact the office and let me know. Please see above for specific wound care orders. We will see patient for  re-evaluation in 1 week(s) here in the clinic. If anything worsens or changes patient will contact our office for additional recommendations. Electronic Signature(s) Signed: 10/24/2018 10:59:30 AM By: Worthy Keeler PA-C Entered By: Worthy Keeler on 10/24/2018 09:31:40 Sohail, Ladeidra W. ([id number]) [address], [place] Viona Gilmore ([id number]) -------------------------------------------------------------------------------- ROS/PFSH Details Patient Name: Stacie Porter Date of Service: 10/24/2018 8:15 AM Medical Record Number: [id number] Patient Account Number: [id number] Date of Birth/Sex: 10/16/1945 (73 y.o. F) Treating RN: Army Melia Primary Care Provider: Fenton Malling Other Clinician: Referring Provider: Fenton Malling Treating Provider/Extender: Melburn Hake, Tatisha Cerino Weeks in Treatment: 2 Information Obtained From Patient Wound History Do you currently have one or more open woundso Yes How many open wounds do you currently haveo 1 Approximately how long have you had your woundso 2 months How have you been treating your wound(s) until nowo neosporin Has your wound(s) ever healed and then re-openedo No Have you had any lab work done in the past montho No Have you tested positive for an antibiotic resistant organism (MRSA, VRE)o No Have you tested positive for osteomyelitis (bone infection)o No Have you had any tests for circulation on your legso No Have you had other problems associated with your woundso Swelling Constitutional Symptoms (General Health) Complaints and Symptoms: Negative for: Fever; Chills Eyes Medical History: Positive for: Cataracts - removed Negative for: Glaucoma; Optic Neuritis Ear/Nose/Mouth/Throat Medical History: Negative for: Chronic sinus problems/congestion; Middle ear problems Hematologic/Lymphatic Medical History: Negative for: Anemia; Hemophilia; Human Immunodeficiency Virus; Lymphedema; Sickle Cell Disease Respiratory Complaints and Symptoms: No  Complaints or Symptoms Medical History: Negative for: Aspiration; Asthma; Chronic Obstructive Pulmonary Disease (COPD); Pneumothorax; Sleep Apnea; Tuberculosis Cardiovascular Complaints and Symptoms: No Complaints or Symptoms Schillo, Tamra W. ([id number]) Medical History: Positive for: Arrhythmia - hx a fib; Hypertension Negative for: Angina; Congestive Heart Failure; Coronary Artery Disease; Deep Vein Thrombosis; Hypotension; Myocardial Infarction; Peripheral Arterial Disease; Peripheral Venous Disease; Phlebitis; Vasculitis Gastrointestinal Medical History: Negative for: Cirrhosis ; Colitis; Crohnos; Hepatitis A; Hepatitis B; Hepatitis C Endocrine Medical History: Positive for: Type II Diabetes Negative for: Type I Diabetes Treated with: Oral agents Genitourinary Medical History: Negative for: End Stage Renal Disease Immunological Medical History:  Negative for: Lupus Erythematosus; Raynaudos; Scleroderma Integumentary (Skin) Medical History: Negative for: History of Burn; History of pressure wounds Musculoskeletal Medical History: Negative for: Gout; Rheumatoid Arthritis; Osteoarthritis; Osteomyelitis Neurologic Medical History: Positive for: Neuropathy Negative for: Dementia; Quadriplegia; Paraplegia; Seizure Disorder Oncologic Medical History: Negative for: Received Chemotherapy; Received Radiation Psychiatric Complaints and Symptoms: No Complaints or Symptoms Medical History: Negative for: Anorexia/bulimia; Confinement Anxiety HBO Extended History Items Majer, Shariyah W. ([id number]) Eyes: Cataracts Immunizations Pneumococcal Vaccine: Received Pneumococcal Vaccination: Yes Immunization Notes: up to date Implantable Devices No devices added Family and Social History Cancer: Yes - Siblings; Diabetes: Yes - Mother; Heart Disease: Yes - Mother,Father; Hereditary Spherocytosis: No; Hypertension: Yes - Mother,Father; Kidney Disease: No; Lung Disease: Yes - Siblings;  Seizures: No; Stroke: Yes - Mother; Thyroid Problems: No; Tuberculosis: No; Never smoker; Marital Status - Married; Alcohol Use: Never; Drug Use: No History; Caffeine Use: Never; Financial Concerns: No; Food, Clothing or Shelter Needs: No; Support System Lacking: No; Transportation Concerns: No; Advanced Directives: Yes (Not Provided); Patient does not want information on Advanced Directives; Living Will: Yes (Not Provided); Medical Power of Attorney: Yes - spouse (Not Provided) Physician Affirmation I have reviewed and agree with the above information. Electronic Signature(s) Signed: 10/24/2018 10:59:30 AM By: Worthy Keeler PA-C Signed: 10/24/2018 12:46:38 PM By: Army Melia Entered By: Worthy Keeler on 10/24/2018 09:27:11 Verhagen, Alvin W. ([id number]) -------------------------------------------------------------------------------- SuperBill Details Patient Name: Stacie Porter Date of Service: 10/24/2018 Medical Record Number: [id number] Patient Account Number: [id number] Date of Birth/Sex: 1946-03-08 (73 y.o. 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, right ankle and foot I48.20 Chronic atrial fibrillation, unspecified Facility Procedures CPT4 Code: 38882800 Description: [phone] - WOUND CARE VISIT-LEV 2 EST PT Modifier: Quantity: 1 Physician Procedures CPT4 Code: 9150569 Description: 79480 - WC PHYS LEVEL 4 - EST PT ICD-10 Diagnosis Description 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

## 2018-10-25 NOTE — Progress Notes (Signed)
NGAN, QUALLS ([id number]) Visit Report for 10/24/2018 HBO Risk Assessment Details Patient Name: Stacie Porter, Stacie Porter Date of Service: 10/24/2018 8:15 AM Medical Record Number: [id number] Patient Account Number: [id number] Date of Birth/Sex: 1946-01-20 (73 y.o. F) Treating RN: Montey Hora Primary Care Enna Warwick: Fenton Malling Other Clinician: Referring Lucion Dilger: Fenton Malling Treating Helga Asbury/Extender: Melburn Hake, HOYT Weeks in Treatment: 2 HBO Risk Assessment Items Answer Barotrauma Risks: Upper Respiratory Infections No Prior Radiation Treatment to Head/Neck No Tracheostomy No Ear problems or surgery (otosclerosis)- Consider pressure equalization No tubes Sinus Problems, Sinus Obstruction No Pulmonary Risks: Currently seeing a pulmonologisto No Emphysema No Pneumothorax No Tuberculosis No Other lung problems (COPD with CO2 retention, lesions, surgery) -Refer to No CPGs Congestive heart Failure -Consider holding HBO if ejection fraction<30% No History of smoking No Bullous Disease, Blebs No Other pulmonary abnormalities No Cardiac Risks: Currently seeing a cardiologisto Yes Dr Cammie Sickle Pacemaker/AICD No Hypertension Yes Diuretic Used (water pill). If yes, last time taken: Yes hydrochlorothiazide History of prior or current malignancy (Cancer) Surgery No Radiation therapy No Chemotherapy No Ophthalmic Risks: Optic Neuritis No Cataracts No Myopia No Retinopathy or Retinal Detachment Surgery- Consider pressure equalization No tubes Stacie Porter, Stacie W. ([id number]) Confinement Anxiety Claustrophobia No Dialysis Dialysis No Any implants; medical or non-medical No Pregnancy No Diabetes Non-Insulin if Yes for Diabetes: Dependent Diabetes Medication: farxiga HgbA1C within 3 months Yes Seizures Seizures No Currently using these medications: Aspirin Yes Digoxin (CHF patient) No Narcotics No Nitroprusside No Phenothiazine (Thorazine,etc.) No Prednisone or  other steroids No Disulfiram (Antabuse) No Mafenide Acetate (Sulfamylon-burn cream) No Amiodarone No Date of last Chest X-ray: 08/30/2016 Date of last EKG: 08/13/2018 Date of Last CBC: 09/24/2018 Notes cataracts have been removed Electronic Signature(s) Signed: 10/24/2018 9:13:20 AM By: Montey Hora Previous Signature: 10/24/2018 9:12:47 AM Version By: Montey Hora Entered By: Montey Hora on 10/24/2018 09:13:20

## 2018-10-25 NOTE — Progress Notes (Signed)
Stacie Porter ([id number]) Visit Report for 10/24/2018 Arrival Information Details Patient Name: Stacie Porter, Stacie Porter Date of Service: 10/24/2018 8:15 AM Medical Record Number: [id number] Patient Account Number: [id number] Date of Birth/Sex: [birth year] (73 y.o. F) Treating RN: Army Melia Primary Care Jodene Polyak: Fenton Malling Other Clinician: Referring Ellanore Vanhook: Fenton Malling Treating Joan Herschberger/Extender: Melburn Hake, HOYT Weeks in Treatment: 2 Visit Information History Since Last Visit Added or deleted any medications: No Patient Arrived: Ambulatory Any new allergies or adverse reactions: No Arrival Time: 08:07 Had a fall or experienced change in No Accompanied By: self activities of daily living that may affect Transfer Assistance: None risk of falls: Patient Identification Verified: Yes Signs or symptoms of abuse/neglect since last visito No Secondary Verification Process Completed: Yes Hospitalized since last visit: No Implantable device outside of the clinic excluding No cellular tissue based products placed in the center since last visit: Has Dressing in Place as Prescribed: Yes Pain Present Now: No Electronic Signature(s) Signed: 10/24/2018 4:19:08 PM By: Lorine Bears RCP, RRT, CHT Entered By: Becky Sax, Amado Nash on 10/24/2018 08:08:19 Oncale, Starling Stacie Porter ([id number]) -------------------------------------------------------------------------------- Clinic Level of Care Assessment Details Patient Name: Stacie Porter Date of Service: 10/24/2018 8:15 AM Medical Record Number: [id number] Patient Account Number: [id number] Date of Birth/Sex: 09-Mar-1946 (73 y.o. F) Treating RN: Army Melia Primary Care Zachary Nole: Fenton Malling Other Clinician: Referring Shawnie Nicole: Fenton Malling Treating Addysen Louth/Extender: Melburn Hake, HOYT Weeks in Treatment: 2 Clinic Level of Care Assessment Items TOOL 4 Quantity Score []  - Use when only an EandM is performed on  FOLLOW-UP visit 0 ASSESSMENTS - Nursing Assessment / Reassessment []  - Reassessment of Co-morbidities (includes updates in patient status) 0 X- 1 5 Reassessment of Adherence to Treatment Plan ASSESSMENTS - Wound and Skin Assessment / Reassessment X - Simple Wound Assessment / Reassessment - one wound 1 5 []  - 0 Complex Wound Assessment / Reassessment - multiple wounds []  - 0 Dermatologic / Skin Assessment (not related to wound area) ASSESSMENTS - Focused Assessment []  - Circumferential Edema Measurements - multi extremities 0 []  - 0 Nutritional Assessment / Counseling / Intervention []  - 0 Lower Extremity Assessment (monofilament, tuning fork, pulses) []  - 0 Peripheral Arterial Disease Assessment (using hand held doppler) ASSESSMENTS - Ostomy and/or Continence Assessment and Care []  - Incontinence Assessment and Management 0 []  - 0 Ostomy Care Assessment and Management (repouching, etc.) PROCESS - Coordination of Care X - Simple Patient / Family Education for ongoing care 1 15 []  - 0 Complex (extensive) Patient / Family Education for ongoing care X- 1 10 Staff obtains Programmer, systems, Records, Test Results / Process Orders []  - 0 Staff telephones HHA, Nursing Homes / Clarify orders / etc []  - 0 Routine Transfer to another Facility (non-emergent condition) []  - 0 Routine Hospital Admission (non-emergent condition) []  - 0 New Admissions / Biomedical engineer / Ordering NPWT, Apligraf, etc. []  - 0 Emergency Hospital Admission (emergent condition) X- 1 10 Simple Discharge Coordination Verbeek, Chiann W. ([id number]) []  - 0 Complex (extensive) Discharge Coordination PROCESS - Special Needs []  - Pediatric / Minor Patient Management 0 []  - 0 Isolation Patient Management []  - 0 Hearing / Language / Visual special needs []  - 0 Assessment of Community assistance (transportation, D/C planning, etc.) []  - 0 Additional assistance / Altered mentation []  - 0 Support Surface(s)  Assessment (bed, cushion, seat, etc.) INTERVENTIONS - Wound Cleansing / Measurement X - Simple Wound Cleansing - one wound 1 5 []  - 0 Complex Wound Cleansing - multiple wounds X-  1 5 Wound Imaging (photographs - any number of wounds) []  - 0 Wound Tracing (instead of photographs) X- 1 5 Simple Wound Measurement - one wound []  - 0 Complex Wound Measurement - multiple wounds INTERVENTIONS - Wound Dressings X - Small Wound Dressing one or multiple wounds 1 10 []  - 0 Medium Wound Dressing one or multiple wounds []  - 0 Large Wound Dressing one or multiple wounds []  - 0 Application of Medications - topical []  - 0 Application of Medications - injection INTERVENTIONS - Miscellaneous []  - External ear exam 0 []  - 0 Specimen Collection (cultures, biopsies, blood, body fluids, etc.) []  - 0 Specimen(s) / Culture(s) sent or taken to Lab for analysis []  - 0 Patient Transfer (multiple staff / Civil Service fast streamer / Similar devices) []  - 0 Simple Staple / Suture removal (25 or less) []  - 0 Complex Staple / Suture removal (26 or more) []  - 0 Hypo / Hyperglycemic Management (close monitor of Blood Glucose) []  - 0 Ankle / Brachial Index (ABI) - do not check if billed separately X- 1 5 Vital Signs Ardelean, Suzzanne W. ([id number]) Has the patient been seen at the hospital within the last three years: Yes Total Score: 75 Level Of Care: New/Established - Level 2 Electronic Signature(s) Signed: 10/24/2018 12:46:38 PM By: Army Melia Entered By: Army Melia on 10/24/2018 11:14:56 Breit, Harlean W. ([id number]) -------------------------------------------------------------------------------- Lower Extremity Assessment Details Patient Name: Stacie Porter Date of Service: 10/24/2018 8:15 AM Medical Record Number: [id number] Patient Account Number: [id number] Date of Birth/Sex: Apr 18, 1946 (73 y.o. F) Treating RN: Montey Hora Primary Care Louise Victory: Fenton Malling Other Clinician: Referring Dietra Stokely:  Fenton Malling Treating Varun Jourdan/Extender: Melburn Hake, HOYT Weeks in Treatment: 2 Edema Assessment Assessed: [Left: No] [Right: No] Edema: [Left: Ye] [Right: s] Vascular Assessment Pulses: Dorsalis Pedis Palpable: [Right:Yes] Posterior Tibial Extremity colors, hair growth, and conditions: Extremity Color: [Right:Normal] Hair Growth on Extremity: [Right:Yes] Temperature of Extremity: [Right:Warm] Capillary Refill: [Right:< 3 seconds] Toe Nail Assessment Left: Right: Thick: Yes Discolored: Yes Deformed: No Improper Length and Hygiene: Yes Electronic Signature(s) Signed: 10/24/2018 4:28:00 PM By: Montey Hora Entered By: Montey Hora on 10/24/2018 08:24:40 Kurihara, Yonna Stacie Porter ([id number]) -------------------------------------------------------------------------------- Multi Wound Chart Details Patient Name: Stacie Porter Date of Service: 10/24/2018 8:15 AM Medical Record Number: [id number] Patient Account Number: [id number] Date of Birth/Sex: 1946-02-10 (73 y.o. F) Treating RN: Army Melia Primary Care Omarion Minnehan: Fenton Malling Other Clinician: Referring Mercades Bajaj: Fenton Malling Treating Desiderio Dolata/Extender: Melburn Hake, HOYT Weeks in Treatment: 2 Vital Signs Height(in): 65 Pulse(bpm): 54 Weight(lbs): 226.3 Blood Pressure(mmHg): 130/50 Body Mass Index(BMI): 38 Temperature(F): 97.7 Respiratory Rate 18 (breaths/min): Photos: [1:No Photos] [N/A:N/A] Wound Location: [1:Right Toe Second - Plantar] [N/A:N/A] Wounding Event: [1:Gradually Appeared] [N/A:N/A] Primary Etiology: [1:Diabetic Wound/Ulcer of the Lower Extremity] [N/A:N/A] Comorbid History: [1:Cataracts, Arrhythmia, Hypertension, Type II Diabetes, Neuropathy] [N/A:N/A] Date Acquired: [1:08/09/2018] [N/A:N/A] Weeks of Treatment: [1:2] [N/A:N/A] Wound Status: [1:Open] [N/A:N/A] Pending Amputation on [1:Yes] [N/A:N/A] Presentation: Measurements L x W x D [1:1.4x0.9x0.2] [N/A:N/A] (cm) Area (cm) : [1:0.99]  [N/A:N/A] Volume (cm) : [1:0.198] [N/A:N/A] % Reduction in Area: [1:-57.60%] [N/A:N/A] % Reduction in Volume: [1:-5.30%] [N/A:N/A] Starting Position 1 [1:10] (o'clock): Ending Position 1 [1:11] (o'clock): Maximum Distance 1 (cm): [1:0.3] Undermining: [1:Yes] [N/A:N/A] Classification: [1:Grade 2] [N/A:N/A] Exudate Amount: [1:Medium] [N/A:N/A] Exudate Type: [1:Serous] [N/A:N/A] Exudate Color: [1:amber] [N/A:N/A] Wound Margin: [1:Flat and Intact] [N/A:N/A] Granulation Amount: [1:None Present (0%)] [N/A:N/A] Necrotic Amount: [1:Large (67-100%)] [N/A:N/A] Exposed Structures: [1:Fat Layer (Subcutaneous Tissue) Exposed: Yes Tendon: Yes Fascia: No Muscle: No] [N/A:N/A]  Joint: No Bone: No Epithelialization: None N/A N/A Periwound Skin Texture: Excoriation: No N/A N/A Induration: No Callus: No Crepitus: No Rash: No Scarring: No Periwound Skin Moisture: Maceration: Yes N/A N/A Dry/Scaly: No Periwound Skin Color: Erythema: Yes N/A N/A Atrophie Blanche: No Cyanosis: No Ecchymosis: No Hemosiderin Staining: No Mottled: No Pallor: No Rubor: No Erythema Location: Circumferential N/A N/A Temperature: No Abnormality N/A N/A Tenderness on Palpation: Yes N/A N/A Wound Preparation: Ulcer Cleansing: N/A N/A Rinsed/Irrigated with Saline Topical Anesthetic Applied: Other: lidocaine 4% Treatment Notes Electronic Signature(s) Signed: 10/24/2018 12:46:38 PM By: Army Melia Entered By: Army Melia on 10/24/2018 08:29:41 Beckel, Shawnique Stacie Porter ([id number]) -------------------------------------------------------------------------------- [place] Details Patient Name: Stacie Porter Date of Service: 10/24/2018 8:15 AM Medical Record Number: [id number] Patient Account Number: [id number] Date of Birth/Sex: 06-Jan-1946 (73 y.o. F) Treating RN: Army Melia Primary Care Crue Otero: Fenton Malling Other Clinician: Referring Kierstyn Baranowski: Fenton Malling Treating  Daiden Coltrane/Extender: Melburn Hake, HOYT Weeks in Treatment: 2 Active Inactive Medication Nursing Diagnoses: Knowledge deficit related to medication safety: actual or potential Goals: Patient/caregiver will demonstrate understanding of all current medications Date Initiated: 10/10/2018 Target Resolution Date: 11/08/2018 Goal Status: Active Patient/caregiver will demonstrate understanding of new oral/IV medications prescribed at the [facility] (topical prescriptions are covered under the skin breakdown problem) Date Initiated: 10/10/2018 Target Resolution Date: 10/27/2018 Goal Status: Active Interventions: Assess for medication contraindications each visit where new medications are prescribed Provide education on medication safety Treatment Activities: Education provided on Medication Safety : 10/10/2018 Notes: Nutrition Nursing Diagnoses: Impaired glucose control: actual or potential Goals: Patient/caregiver verbalizes understanding of need to maintain therapeutic glucose control per primary care physician Date Initiated: 10/10/2018 Target Resolution Date: 11/08/2018 Goal Status: Active Interventions: Provide education on elevated blood sugars and impact on wound healing Notes: Orientation to the Wound Care Program Nursing Diagnoses: Knowledge deficit related to the wound healing center program Mottern, CLARY BOULAIS. ([id number]) Goals: Patient/caregiver will verbalize understanding of the [place] Date Initiated: 10/10/2018 Target Resolution Date: 10/10/2018 Goal Status: Active Interventions: Provide education on orientation to the wound center Notes: Osteomyelitis Nursing Diagnoses: Infection: osteomyelitis Goals: Diagnostic evaluation for osteomyelitis completed as ordered Date Initiated: 10/24/2018 Target Resolution Date: 10/24/2018 Goal Status: Active Patient's osteomyelitis will resolve Date Initiated: 10/24/2018 Target Resolution Date: 12/05/2018 Goal Status:  Active Interventions: Assess for signs and symptoms of osteomyelitis resolution every visit Provide education on osteomyelitis Screen for HBO Treatment Activities: Systemic antibiotics : 10/24/2018 Notes: Wound/Skin Impairment Nursing Diagnoses: Impaired tissue integrity Goals: Patient/caregiver will verbalize understanding of skin care regimen Date Initiated: 10/10/2018 Target Resolution Date: 11/08/2018 Goal Status: Active Ulcer/skin breakdown will have a volume reduction of 30% by week 4 Date Initiated: 10/10/2018 Target Resolution Date: 11/08/2018 Goal Status: Active Interventions: Assess ulceration(s) every visit Treatment Activities: Referred to DME Shalene Gallen for dressing supplies : 10/10/2018 Skin care regimen initiated : 10/10/2018 Topical wound management initiated : 10/10/2018 JOBINA, MAITA ([id number]) Notes: Electronic Signature(s) Signed: 10/24/2018 12:52:10 PM By: Army Melia Previous Signature: 10/24/2018 12:46:38 PM Version By: Army Melia Entered By: Army Melia on 10/24/2018 12:52:10 Jacques, Acasia Stacie Porter ([id number]) -------------------------------------------------------------------------------- Pain Assessment Details Patient Name: Stacie Porter Date of Service: 10/24/2018 8:15 AM Medical Record Number: [id number] Patient Account Number: [id number] Date of Birth/Sex: 05/18/1946 (73 y.o. F) Treating RN: Army Melia Primary Care Zetta Stoneman: Fenton Malling Other Clinician: Referring Oriya Kettering: Fenton Malling Treating [place] Zaragoza/Extender: Melburn Hake, HOYT Weeks in Treatment: 2 Active Problems Location of Pain Severity and Description of Pain Patient Has Paino No Site Locations Pain  Management and Medication Current Pain Management: Electronic Signature(s) Signed: 10/24/2018 8:11:22 AM By: Army Melia Signed: 10/24/2018 4:19:08 PM By: Lorine Bears RCP, RRT, CHT Entered By: Lorine Bears on 10/24/2018 78:58:85 Stacie Porter  ([id number]) -------------------------------------------------------------------------------- Patient/Caregiver Education Details Patient Name: Stacie Porter Date of Service: 10/24/2018 8:15 AM Medical Record Number: [id number] Patient Account Number: [id number] Date of Birth/Gender: 1946/08/03 (73 y.o. F) Treating RN: Army Melia Primary Care Physician: Fenton Malling Other Clinician: Referring Physician: Fenton Malling Treating Physician/Extender: Sharalyn Ink in Treatment: 2 Education Assessment Education Provided To: Patient Education Topics Provided Wound/Skin Impairment: Handouts: Caring for Your Ulcer Methods: Demonstration, Explain/Verbal Responses: State content correctly Electronic Signature(s) Signed: 10/24/2018 12:46:38 PM By: Army Melia Entered By: Army Melia on 10/24/2018 08:50:13 Reichard, Dody W. ([id number]) -------------------------------------------------------------------------------- Wound Assessment Details Patient Name: Stacie Porter Date of Service: 10/24/2018 8:15 AM Medical Record Number: [id number] Patient Account Number: [id number] Date of Birth/Sex: December 02, 1945 (73 y.o. F) Treating RN: Montey Hora Primary Care Layani Foronda: Fenton Malling Other Clinician: Referring Cris Gibby: Fenton Malling Treating Aarohi Redditt/Extender: Melburn Hake, HOYT Weeks in Treatment: 2 Wound Status Wound Number: 1 Primary Diabetic Wound/Ulcer of the Lower Extremity Etiology: Wound Location: Right Toe Second - Plantar Wound Status: Open Wounding Event: Gradually Appeared Comorbid Cataracts, Arrhythmia, Hypertension, Type II Date Acquired: 08/09/2018 History: Diabetes, Neuropathy Weeks Of Treatment: 2 Clustered Wound: No Pending Amputation On Presentation Photos Photo Uploaded By: Montey Hora on 10/24/2018 16:26:31 Wound Measurements Length: (cm) 1.4 Width: (cm) 0.9 Depth: (cm) 0.2 Area: (cm) 0.99 Volume: (cm) 0.198 % Reduction in Area:  -57.6% % Reduction in Volume: -5.3% Epithelialization: None Tunneling: No Undermining: Yes Starting Position (o'clock): 10 Ending Position (o'clock): 11 Maximum Distance: (cm) 0.3 Wound Description Classification: Grade 3 Wagner Verification: MRI Wound Margin: Flat and Intact Exudate Amount: Medium Exudate Type: Serous Exudate Color: amber Foul Odor After Cleansing: No Slough/Fibrino No Wound Bed Granulation Amount: None Present (0%) Exposed Structure Necrotic Amount: Large (67-100%) Fascia Exposed: No Fat Layer (Subcutaneous Tissue) Exposed: Yes Lesnick, Janasia W. ([id number]) Tendon Exposed: Yes Muscle Exposed: No Joint Exposed: No Bone Exposed: No Periwound Skin Texture Texture Color No Abnormalities Noted: No No Abnormalities Noted: No Callus: No Atrophie Blanche: No Crepitus: No Cyanosis: No Excoriation: No Ecchymosis: No Induration: No Erythema: Yes Rash: No Erythema Location: Circumferential Scarring: No Hemosiderin Staining: No Mottled: No Moisture Pallor: No No Abnormalities Noted: No Rubor: No Dry / Scaly: No Maceration: Yes Temperature / Pain Temperature: No Abnormality Tenderness on Palpation: Yes Wound Preparation Ulcer Cleansing: Rinsed/Irrigated with Saline Topical Anesthetic Applied: Other: lidocaine 4%, Electronic Signature(s) Signed: 10/24/2018 9:06:17 AM By: Army Melia Signed: 10/24/2018 4:28:00 PM By: Montey Hora Entered By: Army Melia on 10/24/2018 09:06:17 Bumgardner, Faiga Stacie Porter ([id number]) -------------------------------------------------------------------------------- Vitals Details Patient Name: Stacie Porter Date of Service: 10/24/2018 8:15 AM Medical Record Number: [id number] Patient Account Number: [id number] Date of Birth/Sex: 20-Nov-1945 (73 y.o. F) Treating RN: Army Melia Primary Care Decklin Weddington: Fenton Malling Other Clinician: Referring Trezure Cronk: Fenton Malling Treating Lillyana Majette/Extender: Melburn Hake, HOYT Weeks in  Treatment: 2 Vital Signs Time Taken: 08:08 Temperature (F): 97.7 Height (in): 65 Pulse (bpm): 58 Weight (lbs): 226.3 Respiratory Rate (breaths/min): 18 Body Mass Index (BMI): 37.7 Blood Pressure (mmHg): 130/50 Reference Range: 80 - 120 mg / dl Electronic Signature(s) Signed: 10/24/2018 4:19:08 PM By: Lorine Bears RCP, RRT, CHT Entered By: Lorine Bears on 10/24/2018 08:11:24

## 2018-10-25 NOTE — Telephone Encounter (Signed)
Patient advised a directed below.

## 2018-10-29 ENCOUNTER — Encounter: Payer: Medicare Other | Attending: Physician Assistant | Admitting: Physician Assistant

## 2018-10-29 ENCOUNTER — Other Ambulatory Visit
Admission: RE | Admit: 2018-10-29 | Discharge: 2018-10-29 | Disposition: A | Discharge status: Home / Self Care | Payer: Medicare Other | Source: Ambulatory Visit | Attending: Physician Assistant | Admitting: Physician Assistant

## 2018-10-29 DIAGNOSIS — E11621 Type 2 diabetes mellitus with foot ulcer: Secondary | ICD-10-CM | POA: Diagnosis not present

## 2018-10-29 DIAGNOSIS — Z888 Allergy status to other drugs, medicaments and biological substances status: Secondary | ICD-10-CM | POA: Insufficient documentation

## 2018-10-29 DIAGNOSIS — Z836 Family history of other diseases of the respiratory system: Secondary | ICD-10-CM | POA: Insufficient documentation

## 2018-10-29 DIAGNOSIS — Z833 Family history of diabetes mellitus: Secondary | ICD-10-CM | POA: Diagnosis not present

## 2018-10-29 DIAGNOSIS — Z883 Allergy status to other anti-infective agents status: Secondary | ICD-10-CM | POA: Insufficient documentation

## 2018-10-29 DIAGNOSIS — Z809 Family history of malignant neoplasm, unspecified: Secondary | ICD-10-CM | POA: Insufficient documentation

## 2018-10-29 DIAGNOSIS — L089 Local infection of the skin and subcutaneous tissue, unspecified: Secondary | ICD-10-CM | POA: Diagnosis present

## 2018-10-29 DIAGNOSIS — I10 Essential (primary) hypertension: Secondary | ICD-10-CM | POA: Insufficient documentation

## 2018-10-29 DIAGNOSIS — E1169 Type 2 diabetes mellitus with other specified complication: Secondary | ICD-10-CM | POA: Insufficient documentation

## 2018-10-29 DIAGNOSIS — E1140 Type 2 diabetes mellitus with diabetic neuropathy, unspecified: Secondary | ICD-10-CM | POA: Insufficient documentation

## 2018-10-29 DIAGNOSIS — L97512 Non-pressure chronic ulcer of other part of right foot with fat layer exposed: Secondary | ICD-10-CM | POA: Insufficient documentation

## 2018-10-29 DIAGNOSIS — I482 Chronic atrial fibrillation, unspecified: Secondary | ICD-10-CM | POA: Diagnosis not present

## 2018-10-29 DIAGNOSIS — Z886 Allergy status to analgesic agent status: Secondary | ICD-10-CM | POA: Insufficient documentation

## 2018-10-29 DIAGNOSIS — Z881 Allergy status to other antibiotic agents status: Secondary | ICD-10-CM | POA: Insufficient documentation

## 2018-10-29 DIAGNOSIS — Z8249 Family history of ischemic heart disease and other diseases of the circulatory system: Secondary | ICD-10-CM | POA: Diagnosis not present

## 2018-10-29 DIAGNOSIS — M86671 Other chronic osteomyelitis, right ankle and foot: Secondary | ICD-10-CM | POA: Insufficient documentation

## 2018-10-30 NOTE — Progress Notes (Signed)
MEKENZIE, Stacie ([id number]) Visit Report for 10/29/2018 Arrival Information Details Patient Name: Stacie Porter, Stacie Porter Date of Service: 10/29/2018 8:30 AM Medical Record Number: [id number] Patient Account Number: [id number] Date of Birth/Sex: [birth year] (73 y.o. F) Treating RN: Montey Hora Primary Care Curlie Sittner: Fenton Malling Other Clinician: Referring Maedell Hedger: Fenton Malling Treating Anali Cabanilla/Extender: Melburn Hake, HOYT Weeks in Treatment: 2 Visit Information History Since Last Visit Added or deleted any medications: No Patient Arrived: Ambulatory Any new allergies or adverse reactions: No Arrival Time: 08:22 Had a fall or experienced change in No Accompanied By: self activities of daily living that may affect Transfer Assistance: None risk of falls: Patient Identification Verified: Yes Signs or symptoms of abuse/neglect since last visito No Secondary Verification Process Completed: Yes Hospitalized since last visit: No Implantable device outside of the clinic excluding No cellular tissue based products placed in the center since last visit: Has Dressing in Place as Prescribed: Yes Pain Present Now: No Electronic Signature(s) Signed: 10/29/2018 3:35:17 PM By: Lorine Bears RCP, RRT, CHT Entered By: Lorine Bears on 10/29/2018 08:24:26 Pratt, Cameshia Viona Gilmore ([id number]) -------------------------------------------------------------------------------- Lower Extremity Assessment Details Patient Name: Stacie Porter Date of Service: 10/29/2018 8:30 AM Medical Record Number: [id number] Patient Account Number: [id number] Date of Birth/Sex: 03-20-1946 (73 y.o. F) Treating RN: Army Melia Primary Care Kloee Ballew: Fenton Malling Other Clinician: Referring Rondle Lohse: Fenton Malling Treating Ahmaud Duthie/Extender: Melburn Hake, HOYT Weeks in Treatment: 2 Edema Assessment Assessed: [Left: No] [Right: No] Edema: [Left: N] [Right: o] Vascular  Assessment Pulses: Dorsalis Pedis Palpable: [Right:Yes] Posterior Tibial Extremity colors, hair growth, and conditions: Extremity Color: [Right:Normal] Hair Growth on Extremity: [Right:Yes] Temperature of Extremity: [Right:Warm] Capillary Refill: [Right:< 3 seconds] Toe Nail Assessment Left: Right: Thick: No Discolored: No Deformed: No Improper Length and Hygiene: No Electronic Signature(s) Signed: 10/29/2018 4:08:43 PM By: Army Melia Entered By: Army Melia on 10/29/2018 08:33:39 Lard, Britzy W. ([id number]) -------------------------------------------------------------------------------- Multi Wound Chart Details Patient Name: Stacie Porter Date of Service: 10/29/2018 8:30 AM Medical Record Number: [id number] Patient Account Number: [id number] Date of Birth/Sex: 06-Jul-1946 (73 y.o. F) Treating RN: Montey Hora Primary Care Foch Rosenwald: Fenton Malling Other Clinician: Referring Aalyah Mansouri: Fenton Malling Treating Emiah Pellicano/Extender: Melburn Hake, HOYT Weeks in Treatment: 2 Vital Signs Height(in): 65 Pulse(bpm): 71 Weight(lbs): 226.3 Blood Pressure(mmHg): 139/56 Body Mass Index(BMI): 38 Temperature(F): 97.8 Respiratory Rate 16 (breaths/min): Photos: [1:No Photos] [N/A:N/A] Wound Location: [1:Right Toe Second - Plantar] [N/A:N/A] Wounding Event: [1:Gradually Appeared] [N/A:N/A] Primary Etiology: [1:Diabetic Wound/Ulcer of the Lower Extremity] [N/A:N/A] Comorbid History: [1:Cataracts, Arrhythmia, Hypertension, Type II Diabetes, Neuropathy] [N/A:N/A] Date Acquired: [1:08/09/2018] [N/A:N/A] Weeks of Treatment: [1:2] [N/A:N/A] Wound Status: [1:Open] [N/A:N/A] Pending Amputation on [1:Yes] [N/A:N/A] Presentation: Measurements L x W x D [1:1.4x1x0.2] [N/A:N/A] (cm) Area (cm) : [1:1.1] [N/A:N/A] Volume (cm) : [1:0.22] [N/A:N/A] % Reduction in Area: [1:-75.20%] [N/A:N/A] % Reduction in Volume: [1:-17.00%] [N/A:N/A] Classification: [1:Grade 3] [N/A:N/A] Exudate  Amount: [1:Medium] [N/A:N/A] Exudate Type: [1:Serous] [N/A:N/A] Exudate Color: [1:amber] [N/A:N/A] Wound Margin: [1:Flat and Intact] [N/A:N/A] Granulation Amount: [1:None Present (0%)] [N/A:N/A] Necrotic Amount: [1:Medium (34-66%)] [N/A:N/A] Necrotic Tissue: [1:Eschar, Adherent Slough] [N/A:N/A] Exposed Structures: [1:Fat Layer (Subcutaneous Tissue) Exposed: Yes Tendon: Yes Fascia: No Muscle: No Joint: No Bone: No] [N/A:N/A] Epithelialization: [1:None] [N/A:N/A] Periwound Skin Texture: [1:Excoriation: No Induration: No] [N/A:N/A] Callus: No Crepitus: No Rash: No Scarring: No Periwound Skin Moisture: Maceration: Yes N/A N/A Dry/Scaly: No Periwound Skin Color: Erythema: Yes N/A N/A Atrophie Blanche: No Cyanosis: No Ecchymosis: No Hemosiderin Staining: No Mottled: No Pallor: No Rubor: No Erythema Location: Circumferential N/A  N/A Temperature: No Abnormality N/A N/A Tenderness on Palpation: Yes N/A N/A Wound Preparation: Ulcer Cleansing: N/A N/A Rinsed/Irrigated with Saline Topical Anesthetic Applied: Other: lidocaine 4% Treatment Notes Electronic Signature(s) Signed: 10/29/2018 5:24:46 PM By: Montey Hora Entered By: Montey Hora on 10/29/2018 08:47:14 Muriel, Ryna W. ([id number]) -------------------------------------------------------------------------------- [name] Details Patient Name: Stacie Porter Date of Service: 10/29/2018 8:30 AM Medical Record Number: [id number] Patient Account Number: [id number] Date of Birth/Sex: 1945/10/06 (73 y.o. F) Treating RN: Montey Hora Primary Care Coleman Kalas: Fenton Malling Other Clinician: Referring Annaleigha Woo: Fenton Malling Treating Jaquelynn Wanamaker/Extender: Melburn Hake, HOYT Weeks in Treatment: 2 Active Inactive Medication Nursing Diagnoses: Knowledge deficit related to medication safety: actual or potential Goals: Patient/caregiver will demonstrate understanding of all current medications Date  Initiated: 10/10/2018 Target Resolution Date: 11/08/2018 Goal Status: Active Patient/caregiver will demonstrate understanding of new oral/IV medications prescribed at the [facility] (topical prescriptions are covered under the skin breakdown problem) Date Initiated: 10/10/2018 Target Resolution Date: 10/27/2018 Goal Status: Active Interventions: Assess for medication contraindications each visit where new medications are prescribed Provide education on medication safety Treatment Activities: Education provided on Medication Safety : 10/10/2018 Notes: Nutrition Nursing Diagnoses: Impaired glucose control: actual or potential Goals: Patient/caregiver verbalizes understanding of need to maintain therapeutic glucose control per primary care physician Date Initiated: 10/10/2018 Target Resolution Date: 11/08/2018 Goal Status: Active Interventions: Provide education on elevated blood sugars and impact on wound healing Notes: Orientation to the Wound Care Program Nursing Diagnoses: Knowledge deficit related to the wound healing center program Schnitzer, OTA EBERSOLE. ([id number]) Goals: Patient/caregiver will verbalize understanding of the [place] Date Initiated: 10/10/2018 Target Resolution Date: 10/10/2018 Goal Status: Active Interventions: Provide education on orientation to the wound center Notes: Osteomyelitis Nursing Diagnoses: Infection: osteomyelitis Goals: Diagnostic evaluation for osteomyelitis completed as ordered Date Initiated: 10/24/2018 Target Resolution Date: 10/24/2018 Goal Status: Active Patient's osteomyelitis will resolve Date Initiated: 10/24/2018 Target Resolution Date: 12/05/2018 Goal Status: Active Interventions: Assess for signs and symptoms of osteomyelitis resolution every visit Provide education on osteomyelitis Screen for HBO Treatment Activities: Systemic antibiotics : 10/24/2018 Notes: Wound/Skin Impairment Nursing Diagnoses: Impaired tissue  integrity Goals: Patient/caregiver will verbalize understanding of skin care regimen Date Initiated: 10/10/2018 Target Resolution Date: 11/08/2018 Goal Status: Active Ulcer/skin breakdown will have a volume reduction of 30% by week 4 Date Initiated: 10/10/2018 Target Resolution Date: 11/08/2018 Goal Status: Active Interventions: Assess ulceration(s) every visit Treatment Activities: Referred to DME Atley Neubert for dressing supplies : 10/10/2018 Skin care regimen initiated : 10/10/2018 Topical wound management initiated : 10/10/2018 SIGNA, CHEEK ([id number]) Notes: Electronic Signature(s) Signed: 10/29/2018 5:24:46 PM By: Montey Hora Entered By: Montey Hora on 10/29/2018 08:47:09 Starace, Aniaya Viona Gilmore ([id number]) -------------------------------------------------------------------------------- Pain Assessment Details Patient Name: Stacie Porter Date of Service: 10/29/2018 8:30 AM Medical Record Number: [id number] Patient Account Number: [id number] Date of Birth/Sex: 08/29/1945 (73 y.o. F) Treating RN: Montey Hora Primary Care Henretta Quist: Fenton Malling Other Clinician: Referring Graylin Sperling: Fenton Malling Treating Markeshia Giebel/Extender: Melburn Hake, HOYT Weeks in Treatment: 2 Active Problems Location of Pain Severity and Description of Pain Patient Has Paino No Site Locations Pain Management and Medication Current Pain Management: Electronic Signature(s) Signed: 10/29/2018 3:35:17 PM By: Paulla Fore, RRT, CHT Signed: 10/29/2018 5:24:46 PM By: Montey Hora Entered By: Lorine Bears on 10/29/2018 08:24:32 Peltzer, Michale Viona Gilmore ([id number]) -------------------------------------------------------------------------------- Patient/Caregiver Education Details Patient Name: Stacie Porter Date of Service: 10/29/2018 8:30 AM Medical Record Number: [id number] Patient Account Number: [id number] Date of Birth/Gender: Oct 10, 1945 (73 y.o. F) Treating RN:  Montey Hora Primary Care Physician: Fenton Malling Other Clinician: Referring Physician: Fenton Malling Treating Physician/Extender: Sharalyn Ink in Treatment: 2 Education Assessment Education Provided To: Patient Education Topics Provided Wound/Skin Impairment: Handouts: Other: wound care as ordered Methods: Demonstration, Explain/Verbal Responses: State content correctly Electronic Signature(s) Signed: 10/29/2018 5:24:46 PM By: Montey Hora Entered By: Montey Hora on 10/29/2018 08:58:50 Impastato, Kimya W. ([id number]) -------------------------------------------------------------------------------- Wound Assessment Details Patient Name: Stacie Porter Date of Service: 10/29/2018 8:30 AM Medical Record Number: [id number] Patient Account Number: [id number] Date of Birth/Sex: 09/03/1945 (73 y.o. F) Treating RN: Army Melia Primary Care Aqueelah Cotrell: Fenton Malling Other Clinician: Referring Oluwateniola Leitch: Fenton Malling Treating Wyat Infinger/Extender: Melburn Hake, HOYT Weeks in Treatment: 2 Wound Status Wound Number: 1 Primary Diabetic Wound/Ulcer of the Lower Extremity Etiology: Wound Location: Right Toe Second - Plantar Wound Status: Open Wounding Event: Gradually Appeared Comorbid Cataracts, Arrhythmia, Hypertension, Type II Date Acquired: 08/09/2018 History: Diabetes, Neuropathy Weeks Of Treatment: 2 Clustered Wound: No Pending Amputation On Presentation Photos Photo Uploaded By: Army Melia on 10/29/2018 08:51:30 Wound Measurements Length: (cm) 1.4 Width: (cm) 1 Depth: (cm) 0.2 Area: (cm) 1.1 Volume: (cm) 0.22 % Reduction in Area: -75.2% % Reduction in Volume: -17% Epithelialization: None Tunneling: No Undermining: No Wound Description Classification: Grade 3 Foul Odo Wound Margin: Flat and Intact Slough/F Exudate Amount: Medium Exudate Type: Serous Exudate Color: amber r After Cleansing: No ibrino No Wound Bed Granulation Amount: None Present  (0%) Exposed Structure Necrotic Amount: Medium (34-66%) Fascia Exposed: No Necrotic Quality: Eschar, Adherent Slough Fat Layer (Subcutaneous Tissue) Exposed: Yes Tendon Exposed: Yes Muscle Exposed: No Joint Exposed: No Bone Exposed: Yes Periwound Skin Texture Certain, Shanell W. ([id number]) Texture Color No Abnormalities Noted: No No Abnormalities Noted: No Callus: No Atrophie Blanche: No Crepitus: No Cyanosis: No Excoriation: No Ecchymosis: No Induration: No Erythema: Yes Rash: No Erythema Location: Circumferential Scarring: No Hemosiderin Staining: No Mottled: No Moisture Pallor: No No Abnormalities Noted: No Rubor: No Dry / Scaly: No Maceration: Yes Temperature / Pain Temperature: No Abnormality Tenderness on Palpation: Yes Wound Preparation Ulcer Cleansing: Rinsed/Irrigated with Saline Topical Anesthetic Applied: Other: lidocaine 4%, Electronic Signature(s) Signed: 10/29/2018 4:08:43 PM By: Army Melia Signed: 10/29/2018 5:24:46 PM By: Montey Hora Entered By: Montey Hora on 10/29/2018 08:50:56 Roston, Mariellen W. ([id number]) -------------------------------------------------------------------------------- Vitals Details Patient Name: Stacie Porter Date of Service: 10/29/2018 8:30 AM Medical Record Number: [id number] Patient Account Number: [id number] Date of Birth/Sex: 1945-09-06 (73 y.o. F) Treating RN: Montey Hora Primary Care Aolani Piggott: Fenton Malling Other Clinician: Referring Blondell Laperle: Fenton Malling Treating Nain Rudd/Extender: Melburn Hake, HOYT Weeks in Treatment: 2 Vital Signs Time Taken: 08:24 Temperature (F): 97.8 Height (in): 65 Pulse (bpm): 58 Weight (lbs): 226.3 Respiratory Rate (breaths/min): 16 Body Mass Index (BMI): 37.7 Blood Pressure (mmHg): 139/56 Reference Range: 80 - 120 mg / dl Electronic Signature(s) Signed: 10/29/2018 3:35:17 PM By: Lorine Bears RCP, RRT, CHT Entered By: Lorine Bears on  10/29/2018 08:26:35

## 2018-10-30 NOTE — Progress Notes (Addendum)
Porter, Stacie ([id number]) Visit Report for 10/29/2018 Chief Complaint Document Details Patient Name: Stacie Porter, Stacie Porter Date of Service: 10/29/2018 8:30 AM Medical Record Number: [id number] Patient Account Number: [id number] Date of Birth/Sex: 11-15-1945 (73 y.o. F) Treating RN: Montey Hora Primary Care Provider: Fenton Malling Other Clinician: Referring Provider: Fenton Malling Treating Provider/Extender: Melburn Hake, HOYT Weeks in Treatment: 2 Information Obtained from: Patient Chief Complaint Right 2nd Toe Ulcer Electronic Signature(s) Signed: 10/29/2018 9:02:49 AM By: Worthy Keeler PA-C Entered By: Worthy Keeler on 10/29/2018 08:46:07 Dawson, Jency W. ([id number]) -------------------------------------------------------------------------------- Debridement Details Patient Name: Stacie Porter Date of Service: 10/29/2018 8:30 AM Medical Record Number: [id number] Patient Account Number: [id number] Date of Birth/Sex: [birth year] (73 y.o. F) Treating RN: Montey Hora Primary Care Provider: Fenton Malling Other Clinician: Referring Provider: Fenton Malling Treating Provider/Extender: Melburn Hake, HOYT Weeks in Treatment: 2 Debridement Performed for Wound #1 Right,Plantar Toe Second Assessment: Performed By: Physician STONE III, HOYT E., PA-C Debridement Type: Debridement Severity of Tissue Pre Fat layer exposed Debridement: Level of Consciousness (Pre- Awake and Alert procedure): Pre-procedure Verification/Time Yes - 08:51 Out Taken: Start Time: 08:51 Pain Control: Lidocaine 4% Topical Solution Total Area Debrided (L x W): 1.4 (cm) x 1 (cm) = 1.4 (cm) Tissue and other material Viable, Non-Viable, Bone, Subcutaneous debrided: Level: Skin/Subcutaneous Tissue/Muscle/Bone Debridement Description: Excisional Instrument: Forceps Bleeding: Minimum Hemostasis Achieved: Pressure End Time: 08:53 Procedural Pain: 0 Post Procedural Pain: 0 Response to Treatment:  Procedure was tolerated well Level of Consciousness Awake and Alert (Post-procedure): Post Debridement Measurements of Total Wound Length: (cm) 1.4 Width: (cm) 1 Depth: (cm) 0.2 Volume: (cm) 0.22 Character of Wound/Ulcer Post Debridement: Improved Severity of Tissue Post Debridement: Fat layer exposed Post Procedure Diagnosis Same as Pre-procedure Electronic Signature(s) Signed: 10/29/2018 9:02:49 AM By: Worthy Keeler PA-C Signed: 10/29/2018 5:24:46 PM By: Montey Hora Entered By: Montey Hora on 10/29/2018 08:55:34 Havlik, Vlasta W. ([id number]) -------------------------------------------------------------------------------- HPI Details Patient Name: Stacie Porter Date of Service: 10/29/2018 8:30 AM Medical Record Number: [id number] Patient Account Number: [id number] Date of Birth/Sex: [birth year] (73 y.o. F) Treating RN: Montey Hora Primary Care Provider: Fenton Malling Other Clinician: Referring Provider: Fenton Malling Treating Provider/Extender: Melburn Hake, HOYT Weeks in Treatment: 2 History of Present Illness HPI Description: 10/10/18 patient presents today for initial evaluation our clinic concerning an ulcer that she has on her right second toe on the medial/plantar aspect. This has been present for a couple of months according to the patient. She has been seeing Dr. Vickki Muff who has recommended amputation. The patient however really was not interested in proceeding with an application surgery. For that reason she decided to potentially get a second opinion for the wound center but had wanted to speak with her primary care provider first. She subsequently had her appointment with her PCP and as a result of that conversation it was determined that she would come to the wound center for a second opinion to see if we agreed with the assessment of having pain if you take or not. The patient is really not having any significant pain although she does state that there is  some discomfort. No fevers, chills, nausea, or vomiting noted at this time. She has a history of hypertension, diabetes mellitus type II, and atrial fibrillation for which she has undergone an ablation. She seems to have a desire to want to salvage the toe if it all possible there is a deformity of this region where the second toe overlaps the first which has  caused the pressure and subsequently the wound that we are currently seeing. I did review patient's x-ray as performed by Dr. Vickki Muff which resulted in some question of osseous destruction of the second toe. Nonetheless this was not definitive. 10/17/18 on evaluation today patient's toe actually appears to be doing better in my pinion. She seems to show some signs of epithelialization around the edge of the toe I do believe that she is much less callous although there was a little bit of dry skin and dressing surrounding the wound opening. I think that is just a matter of not putting as much of the Prisma all around but just put it more in the central portion of the wound that will help prevent that type of thing from occurring. In general the maceration between the toes appears to be greatly improved. Overall very pleased in this regard. Her MRI was scheduled originally for Saturday which is two days away she changed this to next Tuesday. She will have this done however before I see her back for follow-up next Thursday. 10/24/18 on evaluation today patient presents for follow-up concerning her right second toe ulcer. On evaluation today patient's wound bed actually showed signs of continued exposure of the tenant although there does not appear to be any drying out a breakdown of the tenant at this time which is good news. I did review her MRI which was obtained two days ago which does show that she has osteomyelitis of the entire toe fortunately this does not seem to proceed into the foot proper. The good news is she's not having any significant  pain which is excellent. The bad news is obviously there is infection she is going to need to continue antibiotics and at minimum we probably need to go ahead and see about making a referral to infectious disease. She also has to make up her mind as far as whether or not she would like to see a surgeon for the possibility of amputation which is another option especially considering the deformity of the second toe which wraps onto the dorsal surface of the great toe which is what appears to have caused the callous and subsequently this wound to begin with. Fortunately there is no evidence of systemic infection at this point. She does seem to be tolerating the doxycycline without complication. 10/29/18 on evaluation today patient appears to be doing about the same in regard to her toe ulcer. It is not appear to be any evidence of infection at this time which is good news. She's been tolerating the dressing changes without complication. With that being said we are not really seeing any significant improvement this time as far as the overall measurements are concerned. I did have a conversation with her quite extensively she would like to see Dr. Berenice Primas, orthopedic surgery, to discuss further options from the standpoint of amputation. She does believe this is the direction she would prefer to go especially in light of the foot deformity with the second toe overlapping the first as it is. Electronic Signature(s) Signed: 10/29/2018 9:02:49 AM By: Worthy Keeler PA-C Entered By: Worthy Keeler on 10/29/2018 09:00:25 Madril, Maytal Viona Gilmore ([id number]) -------------------------------------------------------------------------------- Physical Exam Details Patient Name: Stacie Porter Date of Service: 10/29/2018 8:30 AM Medical Record Number: [id number] Patient Account Number: [id number] Date of Birth/Sex: 1946-07-02 (73 y.o. F) Treating RN: Montey Hora Primary Care Provider: Fenton Malling Other  Clinician: Referring Provider: Fenton Malling Treating Provider/Extender: Melburn Hake, HOYT Weeks in Treatment: 2 Constitutional Well-nourished  and well-hydrated in no acute distress. Respiratory normal breathing without difficulty. Psychiatric this patient is able to make decisions and demonstrates good insight into disease process. Alert and Oriented x 3. pleasant and cooperative. Notes Upon evaluation and inspection today patient actually did have bone noted exposed in the distal portion of the wound bed which was actually loose as well I did remove a portion of this decision for culture today to help direct treatment in the interim until she gets to see the surgeon to discuss amputation. Nonetheless I think that her decision for amputation may be the best in light of what I'm seeing at this point. Electronic Signature(s) Signed: 10/29/2018 9:02:49 AM By: Worthy Keeler PA-C Entered By: Worthy Keeler on 10/29/2018 09:01:29 Borntreger, Vernelle Viona Gilmore ([id number]) -------------------------------------------------------------------------------- Physician Orders Details Patient Name: Stacie Porter Date of Service: 10/29/2018 8:30 AM Medical Record Number: [id number] Patient Account Number: [id number] Date of Birth/Sex: 1946-02-21 (73 y.o. F) Treating RN: Montey Hora Primary Care Provider: Fenton Malling Other Clinician: Referring Provider: Fenton Malling Treating Provider/Extender: Melburn Hake, HOYT Weeks in Treatment: 2 Verbal / Phone Orders: No Diagnosis Coding ICD-10 Coding Code Description E11.621 Type 2 diabetes mellitus with foot ulcer L97.512 Non-pressure chronic ulcer of other part of right foot with fat layer exposed M86.671 Other chronic osteomyelitis, right ankle and foot I48.20 Chronic atrial fibrillation, unspecified Wound Cleansing Wound #1 Right,Plantar Toe Second o Clean wound with Normal Saline. Anesthetic (add to Medication List) Wound #1 Right,Plantar Toe  Second o Topical Lidocaine 4% cream applied to wound bed prior to debridement (In Clinic Only). Skin Barriers/Peri-Wound Care Wound #1 Right,Plantar Toe Second o Antifungal powder-Nystatin Primary Wound Dressing Wound #1 Right,Plantar Toe Second o Mepitel One Contact layer o Silver Collagen - moistened with hydrogel Secondary Dressing Wound #1 Right,Plantar Toe Second o Conform/Kerlix o Other - Lambs wool in between toes for moisture Dressing Change Frequency Wound #1 Right,Plantar Toe Second o Change dressing every other day. Follow-up Appointments Wound #1 Right,Plantar Toe Second o Return Appointment in 1 week. Edema Control Wound #1 Right,Plantar Toe Second o Elevate legs to the level of the heart and pump ankles as often as possible Tapanes, Maguire W. ([id number]) Off-Loading Wound #1 Right,Plantar Toe Second o Open toe surgical shoe with peg assist. Medications-please add to medication list. Wound #1 Right,Plantar Toe Second o P.O. Antibiotics o Other: - Nystatin powder [facility] o Bacteria identified in Wound by Culture (MICRO) oooo LOINC Code: 2706-2 BJSE Convenience Name: Wound culture routine Patient Medications Allergies: ibuprofen, meloxicam, Multaq Notifications Medication Indication Start End Diflucan 10/29/2018 DOSE 1 - oral 150 mg tablet - 1 tablet oral taken today for yeast infection. If not completely resolved repeat in 1 week Levaquin 11/02/2018 DOSE 1 - oral 500 mg tablet - 1 tablet oral taken 1 time a day for 14 days. Do not take anymore of the diflucan with this. Electronic Signature(s) Signed: 11/02/2018 6:19:12 AM By: Worthy Keeler PA-C Previous Signature: 10/29/2018 9:25:31 AM Version By: Worthy Keeler PA-C Previous Signature: 10/29/2018 9:02:49 AM Version By: Worthy Keeler PA-C Entered By: Worthy Keeler on 11/02/2018 06:19:11 Logue, Krystl W.  ([id number]) -------------------------------------------------------------------------------- Problem List Details Patient Name: Stacie Porter Date of Service: 10/29/2018 8:30 AM Medical Record Number: [id number] Patient Account Number: [id number] Date of Birth/Sex: 1945/09/04 (73 y.o. F) Treating RN: Montey Hora Primary Care Provider: Fenton Malling Other Clinician: Referring Provider: Fenton Malling Treating Provider/Extender: Melburn Hake, HOYT Weeks in Treatment:  2 Active Problems ICD-10 Evaluated Encounter Code Description Active Date Today Diagnosis E11.621 Type 2 diabetes mellitus with foot ulcer 10/10/2018 No Yes L97.512 Non-pressure chronic ulcer of other part of right foot with fat 10/10/2018 No Yes layer exposed M86.671 Other chronic osteomyelitis, right ankle and foot 10/24/2018 No Yes I48.20 Chronic atrial fibrillation, unspecified 10/10/2018 No Yes Inactive Problems Resolved Problems Electronic Signature(s) Signed: 10/29/2018 9:02:49 AM By: Worthy Keeler PA-C Entered By: Worthy Keeler on 10/29/2018 08:45:44 Garden, Adalynd W. ([id number]) -------------------------------------------------------------------------------- Progress Note Details Patient Name: Stacie Porter Date of Service: 10/29/2018 8:30 AM Medical Record Number: [id number] Patient Account Number: [id number] Date of Birth/Sex: 28-Sep-1945 (73 y.o. F) Treating RN: Montey Hora Primary Care Provider: Fenton Malling Other Clinician: Referring Provider: Fenton Malling Treating Provider/Extender: Melburn Hake, HOYT Weeks in Treatment: 2 Subjective Chief Complaint Information obtained from Patient Right 2nd Toe Ulcer History of Present Illness (HPI) 10/10/18 patient presents today for initial evaluation our clinic concerning an ulcer that she has on her right second toe on the medial/plantar aspect. This has been present for a couple of months according to the patient. She has been seeing Dr.  Vickki Muff who has recommended amputation. The patient however really was not interested in proceeding with an application surgery. For that reason she decided to potentially get a second opinion for the wound center but had wanted to speak with her primary care provider first. She subsequently had her appointment with her PCP and as a result of that conversation it was determined that she would come to the wound center for a second opinion to see if we agreed with the assessment of having pain if you take or not. The patient is really not having any significant pain although she does state that there is some discomfort. No fevers, chills, nausea, or vomiting noted at this time. She has a history of hypertension, diabetes mellitus type II, and atrial fibrillation for which she has undergone an ablation. She seems to have a desire to want to salvage the toe if it all possible there is a deformity of this region where the second toe overlaps the first which has caused the pressure and subsequently the wound that we are currently seeing. I did review patient's x-ray as performed by Dr. Vickki Muff which resulted in some question of osseous destruction of the second toe. Nonetheless this was not definitive. 10/17/18 on evaluation today patient's toe actually appears to be doing better in my pinion. She seems to show some signs of epithelialization around the edge of the toe I do believe that she is much less callous although there was a little bit of dry skin and dressing surrounding the wound opening. I think that is just a matter of not putting as much of the Prisma all around but just put it more in the central portion of the wound that will help prevent that type of thing from occurring. In general the maceration between the toes appears to be greatly improved. Overall very pleased in this regard. Her MRI was scheduled originally for Saturday which is two days away she changed this to next Tuesday. She will  have this done however before I see her back for follow-up next Thursday. 10/24/18 on evaluation today patient presents for follow-up concerning her right second toe ulcer. On evaluation today patient's wound bed actually showed signs of continued exposure of the tenant although there does not appear to be any drying out a breakdown of the tenant at this time which  is good news. I did review her MRI which was obtained two days ago which does show that she has osteomyelitis of the entire toe fortunately this does not seem to proceed into the foot proper. The good news is she's not having any significant pain which is excellent. The bad news is obviously there is infection she is going to need to continue antibiotics and at minimum we probably need to go ahead and see about making a referral to infectious disease. She also has to make up her mind as far as whether or not she would like to see a surgeon for the possibility of amputation which is another option especially considering the deformity of the second toe which wraps onto the dorsal surface of the great toe which is what appears to have caused the callous and subsequently this wound to begin with. Fortunately there is no evidence of systemic infection at this point. She does seem to be tolerating the doxycycline without complication. 10/29/18 on evaluation today patient appears to be doing about the same in regard to her toe ulcer. It is not appear to be any evidence of infection at this time which is good news. She's been tolerating the dressing changes without complication. With that being said we are not really seeing any significant improvement this time as far as the overall measurements are concerned. I did have a conversation with her quite extensively she would like to see Dr. Berenice Primas, orthopedic surgery, to discuss further options from the standpoint of amputation. She does believe this is the direction she would prefer to go especially  in light of the foot deformity with the second toe overlapping the first as it is. Costen, [id number] ([id number]) Patient History Information obtained from Patient. Family History Cancer - Siblings, Diabetes - Mother, Heart Disease - Mother,Father, Hypertension - Mother,Father, Lung Disease - Siblings, Stroke - Mother, No family history of Hereditary Spherocytosis, Kidney Disease, Seizures, Thyroid Problems, Tuberculosis. Social History Never smoker, Marital Status - Married, Alcohol Use - Never, Drug Use - No History, Caffeine Use - Never. Medical History Eyes Patient has history of Cataracts - removed Denies history of Glaucoma, Optic Neuritis Ear/Nose/Mouth/Throat Denies history of Chronic sinus problems/congestion, Middle ear problems Hematologic/Lymphatic Denies history of Anemia, Hemophilia, Human Immunodeficiency Virus, Lymphedema, Sickle Cell Disease Respiratory Denies history of Aspiration, Asthma, Chronic Obstructive Pulmonary Disease (COPD), Pneumothorax, Sleep Apnea, Tuberculosis Cardiovascular Patient has history of Arrhythmia - hx a fib, Hypertension Denies history of Angina, Congestive Heart Failure, Coronary Artery Disease, Deep Vein Thrombosis, Hypotension, Myocardial Infarction, Peripheral Arterial Disease, Peripheral Venous Disease, Phlebitis, Vasculitis Gastrointestinal Denies history of Cirrhosis , Colitis, Crohn s, Hepatitis A, Hepatitis B, Hepatitis C Endocrine Patient has history of Type II Diabetes Denies history of Type I Diabetes Genitourinary Denies history of End Stage Renal Disease Immunological Denies history of Lupus Erythematosus, Raynaud s, Scleroderma Integumentary (Skin) Denies history of History of Burn, History of pressure wounds Musculoskeletal Denies history of Gout, Rheumatoid Arthritis, Osteoarthritis, Osteomyelitis Neurologic Patient has history of Neuropathy Denies history of Dementia, Quadriplegia, Paraplegia, Seizure  Disorder Oncologic Denies history of Received Chemotherapy, Received Radiation Psychiatric Denies history of Anorexia/bulimia, Confinement Anxiety Review of Systems (ROS) Constitutional Symptoms (General Health) Denies complaints or symptoms of Fever, Chills. Respiratory The patient has no complaints or symptoms. Cardiovascular The patient has no complaints or symptoms. Psychiatric The patient has no complaints or symptoms. Hildebrant, Kathye W. ([id number]) Objective Constitutional Well-nourished and well-hydrated in no acute distress. Vitals Time Taken: 8:24 AM, Height: 65  in, Weight: 226.3 lbs, BMI: 37.7, Temperature: 97.8 F, Pulse: 58 bpm, Respiratory Rate: 16 breaths/min, Blood Pressure: 139/56 mmHg. Respiratory normal breathing without difficulty. Psychiatric this patient is able to make decisions and demonstrates good insight into disease process. Alert and Oriented x 3. pleasant and cooperative. General Notes: Upon evaluation and inspection today patient actually did have bone noted exposed in the distal portion of the wound bed which was actually loose as well I did remove a portion of this decision for culture today to help direct treatment in the interim until she gets to see the surgeon to discuss amputation. Nonetheless I think that her decision for amputation may be the best in light of what I'm seeing at this point. Integumentary (Hair, Skin) Wound #1 status is Open. Original cause of wound was Gradually Appeared. The wound is located on the Right,Plantar Toe Second. The wound measures 1.4cm length x 1cm width x 0.2cm depth; 1.1cm^2 area and 0.22cm^3 volume. There is bone, tendon, and Fat Layer (Subcutaneous Tissue) Exposed exposed. There is no tunneling or undermining noted. There is a medium amount of serous drainage noted. The wound margin is flat and intact. There is no granulation within the wound bed. There is a medium (34-66%) amount of necrotic tissue within the  wound bed including Eschar and Adherent Slough. The periwound skin appearance exhibited: Maceration, Erythema. The periwound skin appearance did not exhibit: Callus, Crepitus, Excoriation, Induration, Rash, Scarring, Dry/Scaly, Atrophie Blanche, Cyanosis, Ecchymosis, Hemosiderin Staining, Mottled, Pallor, Rubor. The surrounding wound skin color is noted with erythema which is circumferential. Periwound temperature was noted as No Abnormality. The periwound has tenderness on palpation. 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 ([id number]) Second .Severity of Tissue Pre Debridement is: Fat layer exposed. There was a Excisional Skin/Subcutaneous Tissue/Muscle/Bone Debridement with a total area of 1.4 sq cm performed by STONE III, HOYT E., PA-C. With the following instrument(s): Forceps to remove Viable and Non-Viable tissue/material. Material removed includes Bone,Subcutaneous Tissue and after achieving pain control using Lidocaine 4% Topical Solution. No specimens were taken. A time out was conducted at 08:51, prior to the start of the procedure. A Minimum amount of bleeding was controlled with Pressure. The procedure was tolerated well with a pain level of 0 throughout and a pain level of 0 following the procedure. Post Debridement Measurements: 1.4cm length x 1cm width x 0.2cm depth; 0.22cm^3 volume. Character of Wound/Ulcer Post Debridement is improved. Severity of Tissue Post Debridement is: Fat layer exposed. Post procedure Diagnosis Wound #1: Same as Pre-Procedure Plan Wound Cleansing: Wound #1 Right,Plantar Toe Second: Clean wound  with Normal Saline. Anesthetic (add to Medication List): Wound #1 Right,Plantar Toe Second: Topical Lidocaine 4% cream applied to wound bed prior to debridement (In Clinic Only). Skin Barriers/Peri-Wound Care: Wound #1 Right,Plantar Toe Second: Antifungal powder-Nystatin Primary Wound Dressing: Wound #1 Right,Plantar Toe Second: Mepitel One Contact layer Silver Collagen - moistened with hydrogel Secondary Dressing: Wound #1 Right,Plantar Toe Second: Conform/Kerlix Other - Lambs wool in between toes for moisture Dressing Change Frequency: Wound #1 Right,Plantar Toe Second: Change dressing every other day. Follow-up Appointments: Wound #1 Right,Plantar Toe Second: Return Appointment in 1 week. Edema Control: Wound #1 Right,Plantar Toe Second: Elevate  legs to the level of the heart and pump ankles as often as possible Off-Loading: Wound #1 Right,Plantar Toe Second: Open toe surgical shoe with peg assist. Medications-please add to medication list.: Wound #1 Right,Plantar Toe Second: P.O. Antibiotics Other: - Nystatin powder Laboratory ordered were: Wound culture routine Consults ordered were: [place] The following medication(s) was prescribed: Diflucan oral 150 mg tablet 1 1 tablet oral taken today for yeast infection. If not completely resolved repeat in 1 week starting 10/29/2018 Levaquin oral 500 mg tablet 1 1 tablet oral taken 1 time a day for 14 days. Do not take anymore of the diflucan with this. starting 11/02/2018 Eshleman, Aniah W. ([id number]) Yetta Flock suggest currently that we go ahead and continue with the above wound care measures I feel like this is still the best option for her at this point. The patient is in agreement with plan. Subsequently I will see her back for reevaluation in one weeks time see were things stand. If anything changes or worsens in the meantime shall contact the office and let me know. Otherwise we are gonna see about  getting the referral to Dr. Berenice Primas who is an orthopedic surgeon in [facility] for further evaluation and treatment. I will contact her as soon as I have the results of the bone culture back. Please see above for specific wound care orders. We will see patient for re-evaluation in 1 week(s) here in the clinic. If anything worsens or changes patient will contact our office for additional recommendations. Electronic Signature(s) Signed: 11/02/2018 6:19:36 AM By: Worthy Keeler PA-C Previous Signature: 10/29/2018 9:25:51 AM Version By: Worthy Keeler PA-C Previous Signature: 10/29/2018 9:02:49 AM Version By: Worthy Keeler PA-C Entered By: Worthy Keeler on 11/02/2018 06:19:36 Lok, Angalina W. ([id number]) -------------------------------------------------------------------------------- ROS/PFSH Details Patient Name: Stacie Porter Date of Service: 10/29/2018 8:30 AM Medical Record Number: [id number] Patient Account Number: [id number] Date of Birth/Sex: [birth year] (73 y.o. F) Treating RN: Montey Hora Primary Care Provider: Fenton Malling Other Clinician: Referring Provider: Fenton Malling Treating Provider/Extender: Melburn Hake, HOYT Weeks in Treatment: 2 Information Obtained From Patient Wound History Do you currently have one or more open woundso Yes How many open wounds do you currently haveo 1 Approximately how long have you had your woundso 2 months How have you been treating your wound(s) until nowo neosporin Has your wound(s) ever healed and then re-openedo No Have you had any lab work done in the past montho No Have you tested positive for an antibiotic resistant organism (MRSA, VRE)o No Have you tested positive for osteomyelitis (bone infection)o No Have you had any tests for circulation on your legso No Have you had other problems associated with your woundso Swelling Constitutional Symptoms (General Health) Complaints and Symptoms: Negative for: Fever;  Chills Eyes Medical History: Positive for: Cataracts - removed Negative for: Glaucoma; Optic Neuritis Ear/Nose/Mouth/Throat Medical History: Negative for: Chronic sinus problems/congestion; Middle ear problems Hematologic/Lymphatic Medical History: Negative for: Anemia; Hemophilia; Human Immunodeficiency Virus; Lymphedema; Sickle Cell Disease Respiratory Complaints and Symptoms: No Complaints or Symptoms Medical History: Negative for: Aspiration; Asthma; Chronic Obstructive Pulmonary Disease (COPD); Pneumothorax; Sleep Apnea; Tuberculosis Cardiovascular Complaints and Symptoms: No Complaints or Symptoms Reames, Lota W. ([id number]) Medical History: Positive for: Arrhythmia - hx a fib; Hypertension Negative for: Angina; Congestive Heart Failure; Coronary Artery Disease; Deep Vein Thrombosis; Hypotension; Myocardial Infarction; Peripheral Arterial Disease; Peripheral Venous Disease; Phlebitis; Vasculitis Gastrointestinal Medical History: Negative for: Cirrhosis ; Colitis; Crohnos; Hepatitis A; Hepatitis  B; Hepatitis C Endocrine Medical History: Positive for: Type II Diabetes Negative for: Type I Diabetes Treated with: Oral agents Genitourinary Medical History: Negative for: End Stage Renal Disease Immunological Medical History: Negative for: Lupus Erythematosus; Raynaudos; Scleroderma Integumentary (Skin) Medical History: Negative for: History of Burn; History of pressure wounds Musculoskeletal Medical History: Negative for: Gout; Rheumatoid Arthritis; Osteoarthritis; Osteomyelitis Neurologic Medical History: Positive for: Neuropathy Negative for: Dementia; Quadriplegia; Paraplegia; Seizure Disorder Oncologic Medical History: Negative for: Received Chemotherapy; Received Radiation Psychiatric Complaints and Symptoms: No Complaints or Symptoms Medical History: Negative for: Anorexia/bulimia; Confinement Anxiety HBO Extended History Items Mandler, Emilina W.  ([id number]) Eyes: Cataracts Immunizations Pneumococcal Vaccine: Received Pneumococcal Vaccination: Yes Immunization Notes: up to date Implantable Devices No devices added Family and Social History Cancer: Yes - Siblings; Diabetes: Yes - Mother; Heart Disease: Yes - Mother,Father; Hereditary Spherocytosis: No; Hypertension: Yes - Mother,Father; Kidney Disease: No; Lung Disease: Yes - Siblings; Seizures: No; Stroke: Yes - Mother; Thyroid Problems: No; Tuberculosis: No; Never smoker; Marital Status - Married; Alcohol Use: Never; Drug Use: No History; Caffeine Use: Never; Financial Concerns: No; Food, Clothing or Shelter Needs: No; Support System Lacking: No; Transportation Concerns: No; Advanced Directives: Yes (Not Provided); Patient does not want information on Advanced Directives; Living Will: Yes (Not Provided); Medical Power of Attorney: Yes - spouse (Not Provided) Physician Affirmation I have reviewed and agree with the above information. Electronic Signature(s) Signed: 10/29/2018 9:02:49 AM By: Worthy Keeler PA-C Signed: 10/29/2018 5:24:46 PM By: Montey Hora Entered By: Worthy Keeler on 10/29/2018 09:00:47 Semper, [place] W. ([id number]) -------------------------------------------------------------------------------- SuperBill Details Patient Name: Stacie Porter Date of Service: 10/29/2018 Medical Record Number: [id number] Patient Account Number: [id number] Date of Birth/Sex: March 20, 1946 (73 y.o. 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, right ankle and foot I48.20 Chronic atrial fibrillation,  unspecified Facility Procedures CPT4 Code: [id number] Description: 11044 - DEB BONE 20 SQ CM/< ICD-10 Diagnosis Description L97.512 Non-pressure chronic ulcer of other part of right foot with fa Modifier: t layer exposed Quantity: 1 Physician Procedures CPT4: Description Modifier Quantity Code [id number] Debridement; bone (includes epidermis, dermis, subQ tissue, muscle and/or fascia, if 1 performed) 1st 20 sqcm or less ICD-10 Diagnosis Description L97.512 Non-pressure chronic ulcer of other part of right  foot with fat layer exposed Electronic Signature(s) Signed: 10/29/2018 9:02:49 AM By: Worthy Keeler PA-C Entered By: Worthy Keeler on 10/29/2018 09:02:28

## 2018-10-31 LAB — AEROBIC CULTURE  (SUPERFICIAL SPECIMEN)

## 2018-10-31 LAB — AEROBIC CULTURE W GRAM STAIN (SUPERFICIAL SPECIMEN)

## 2018-11-04 ENCOUNTER — Encounter: Payer: Self-pay | Admitting: *Deleted

## 2018-11-04 ENCOUNTER — Encounter: Payer: Medicare Other | Admitting: *Deleted

## 2018-11-04 VITALS — BP 116/65 | Ht 65.0 in | Wt 222.7 lb

## 2018-11-04 DIAGNOSIS — E11621 Type 2 diabetes mellitus with foot ulcer: Secondary | ICD-10-CM | POA: Diagnosis not present

## 2018-11-04 DIAGNOSIS — L97509 Non-pressure chronic ulcer of other part of unspecified foot with unspecified severity: Principal | ICD-10-CM

## 2018-11-04 NOTE — Patient Instructions (Addendum)
Check blood sugars 1 x day before breakfast or 2 hrs after supper 3 x week Bring blood sugar records to the next class  Exercise:  Once cleared by surgeon  Eat 3 meals day,  1-2 snacks a day Space meals 4-6 hours apart Avoid sugar sweetened drinks (tea)  Return for appointment on:  Call back to schedule an appointment with the nurse after surgery

## 2018-11-04 NOTE — Progress Notes (Signed)
Diabetes Self-Management Education  Visit Type: First/Initial  Appt. Start Time: 1350 Appt. End Time: 4742  11/04/2018  [name], identified by name and date of birth, is a 73 y.o. female with a diagnosis of Diabetes: Type 2.   ASSESSMENT  Blood pressure 116/65, height 5\' 5"  (1.651 m), weight 222 lb 11.2 oz (101 kg). Body mass index is 37.06 kg/m.  Diabetes Self-Management Education - 11/04/18 1535      Visit Information   Visit Type  First/Initial      Initial Visit   Diabetes Type  Type 2    Are you currently following a meal plan?  Yes    What type of meal plan do you follow?  weight watchers    Are you taking your medications as prescribed?  Yes    Date Diagnosed  Feb 2020      Health Coping   How would you rate your overall health?  Good      Psychosocial Assessment   Patient Belief/Attitude about Diabetes  Motivated to manage diabetes   "sad"   Self-care barriers  None    Self-management support  Doctor's office;Family    Patient Concerns  Nutrition/Meal planning;Medication;Monitoring;Healthy Lifestyle;Weight Control    Special Needs  None    Preferred Learning Style  Auditory;Visual;Hands on    [place] in progress    How often do you need to have someone help you when you read instructions, pamphlets, or other written materials from your doctor or pharmacy?  1 - Never    What is the last grade level you completed in school?  1 year college      Pre-Education Assessment   Patient understands the diabetes disease and treatment process.  Needs Instruction    Patient understands incorporating nutritional management into lifestyle.  Needs Instruction    Patient undertands incorporating physical activity into lifestyle.  Needs Instruction    Patient understands using medications safely.  Needs Instruction    Patient understands monitoring blood glucose, interpreting and using results  Needs Review    Patient understands prevention, detection,  and treatment of acute complications.  Needs Instruction    Patient understands prevention, detection, and treatment of chronic complications.  Needs Instruction    Patient understands how to develop strategies to address psychosocial issues.  Needs Instruction    Patient understands how to develop strategies to promote health/change behavior.  Needs Instruction      Complications   Last HgB A1C per patient/outside source  6.8 %   09/23/18   How often do you check your blood sugar?  3-4 times / week   Pt is checking 1 x week   Fasting Blood glucose range (mg/dL)  130-179   FBG's 137-168 mg/dL.    Have you had a dilated eye exam in the past 12 months?  Yes    Have you had a dental exam in the past 12 months?  Yes    Are you checking your feet?  Yes   Pt has foot infection to 2nd toe right foot. She will have surgery to remove.    How many days per week are you checking your feet?  7      Dietary Intake   Breakfast  2 strips bacon, fruit, cheese; bagel with peanut butter, ww yogurt    Snack (morning)  chips, cheese stick, ritz crackers    Lunch  deli ham or [place] sandwich with raw vegetables - carrots, cauliflower, cuccumbers  Snack (afternoon)  same as morning snack    Dinner  beef, pork, chicken - potatoes, bread, peas, beans, green beans, brocolli, occasional pasta and rice    Beverage(s)  water, tea with sugar and artificial sweetener      Exercise   Exercise Type  ADL's   Pt has been walking, using exericise tape and weights for 30 mintues 3 x week.      Patient Education   Previous Diabetes Education  No    Disease state   Definition of diabetes, type 1 and 2, and the diagnosis of diabetes;Factors that contribute to the development of diabetes    Nutrition management   Role of diet in the treatment of diabetes and the relationship between the three main macronutrients and blood glucose level;Food label reading, portion sizes and measuring food.;Reviewed blood glucose goals for  pre and post meals and how to evaluate the patients' food intake on their blood glucose level.    Physical activity and exercise   Role of exercise on diabetes management, blood pressure control and cardiac health.    Medications  Reviewed patients medication for diabetes, action, purpose, timing of dose and side effects.    Monitoring  Purpose and frequency of SMBG.;Taught/discussed recording of test results and interpretation of SMBG.;Identified appropriate SMBG and/or A1C goals.    Chronic complications  Relationship between chronic complications and blood glucose control;Assessed and discussed foot care and prevention of foot problems    Psychosocial adjustment  Role of stress on diabetes;Identified and addressed patients feelings and concerns about diabetes      Individualized Goals (developed by patient)   Reducing Risk Decrease medications Prevent diabetes complications Lose weight Lead a healthier lifestyle Become more fit     Outcomes   Expected Outcomes  Demonstrated interest in learning. Expect positive outcomes    Future DMSE  Other (comment)   Pt will call back after surgery to schedule an additional appointment with the nurse.       Individualized Plan for Diabetes Self-Management Training:   Learning Objective:  Patient will have a greater understanding of diabetes self-management. Patient education plan is to attend individual and/or group sessions per assessed needs and concerns.   Plan:   Patient Instructions  Check blood sugars 1 x day before breakfast or 2 hrs after supper 3 x week Bring blood sugar records to the next class Exercise:  Once cleared by surgeon Eat 3 meals day,  1-2 snacks a day Space meals 4-6 hours apart Avoid sugar sweetened drinks (tea) Return for appointment on:  Call back to schedule an appointment with the nurse after surgery  Expected Outcomes:  Demonstrated interest in learning. Expect positive outcomes  Education material provided:   General Meal Planning Guidelines Simple Meal Plan  If problems or questions, patient to contact team via:   Johny Drilling, RN, CCM, CDE [phone]  Future DSME appointment:  Pt will call back after surgery to schedule an additional appointment with the nurse or diabetes classes.

## 2018-11-05 ENCOUNTER — Encounter: Payer: Medicare Other | Admitting: Physician Assistant

## 2018-11-05 DIAGNOSIS — E11621 Type 2 diabetes mellitus with foot ulcer: Secondary | ICD-10-CM | POA: Diagnosis not present

## 2018-11-06 ENCOUNTER — Telehealth: Payer: Self-pay | Admitting: Physician Assistant

## 2018-11-06 ENCOUNTER — Other Ambulatory Visit: Payer: Self-pay | Admitting: Orthopedic Surgery

## 2018-11-06 NOTE — Telephone Encounter (Signed)
I should be able to complete from her visit since she has cardia clearance already

## 2018-11-06 NOTE — Patient Instructions (Addendum)
Stacie Porter  11/06/2018   Your procedure is scheduled on: 11-11-18  Report to [facility] Main  Entrance                Report to admitting at         115 PM    Call this number if you have problems the morning of surgery [phone]    Remember: Do not eat food:After Midnight.  You may have clear liquids until 0915 am then nothing by mouth    BRUSH YOUR TEETH MORNING OF SURGERY AND RINSE YOUR MOUTH OUT, NO CHEWING GUM CANDY OR MINTS.     Take these medicines the morning of surgery with A SIP OF WATER:     Hold Farxiga day before surgery  DO NOT TAKE ANY DIABETIC MEDICATIONS DAY OF YOUR SURGERY                               You may not have any metal on your body including hair pins and              piercings  Do not wear jewelry, make-up, lotions, powders or perfumes, deodorant             Do not wear nail polish.  Do not shave  48 hours prior to surgery.     Do not bring valuables to the hospital. [place].  Contacts, dentures or bridgework may not be worn into surgery.      Patients discharged the day of surgery will not be allowed to drive home. IF YOU ARE HAVING SURGERY AND GOING HOME THE SAME DAY, YOU MUST HAVE AN ADULT TO DRIVE YOU HOME AND BE WITH YOU FOR 24 HOURS. YOU MAY GO HOME BY TAXI OR UBER OR ORTHERWISE, BUT AN ADULT MUST ACCOMPANY YOU HOME AND STAY WITH YOU FOR 24 HOURS.  Name and phone number of your driver:  Special Instructions: N/A              Please read over the following fact sheets you were given: _____________________________________________________________________             [facility] - Preparing for Surgery Before surgery, you can play an important role.  Because skin is not sterile, your skin needs to be as free of germs as possible.  You can reduce the number of germs on your skin by washing with CHG (chlorahexidine gluconate) soap before surgery.  CHG is an  antiseptic cleaner which kills germs and bonds with the skin to continue killing germs even after washing. Please DO NOT use if you have an allergy to CHG or antibacterial soaps.  If your skin becomes reddened/irritated stop using the CHG and inform your nurse when you arrive at Short Stay. Do not shave (including legs and underarms) for at least 48 hours prior to the first CHG shower.  You may shave your face/neck. Please follow these instructions carefully:  1.  Shower with CHG Soap the night before surgery and the  morning of Surgery.  2.  If you choose to wash your hair, wash your hair first as usual with your  normal  shampoo.  3.  After you shampoo, rinse your hair and body thoroughly  to remove the  shampoo.                           4.  Use CHG as you would any other liquid soap.  You can apply chg directly  to the skin and wash                       Gently with a scrungie or clean washcloth.  5.  Apply the CHG Soap to your body ONLY FROM THE NECK DOWN.   Do not use on face/ open                           Wound or open sores. Avoid contact with eyes, ears mouth and genitals (private parts).                       Wash face,  Genitals (private parts) with your normal soap.             6.  Wash thoroughly, paying special attention to the area where your surgery  will be performed.  7.  Thoroughly rinse your body with warm water from the neck down.  8.  DO NOT shower/wash with your normal soap after using and rinsing off  the CHG Soap.                9.  Pat yourself dry with a clean towel.            10.  Wear clean pajamas.            11.  Place clean sheets on your bed the night of your first shower and do not  sleep with pets. Day of Surgery : Do not apply any lotions/deodorants the morning of surgery.  Please wear clean clothes to the hospital/surgery center.  FAILURE TO FOLLOW THESE INSTRUCTIONS MAY RESULT IN THE CANCELLATION OF YOUR SURGERY PATIENT  SIGNATURE_________________________________  NURSE SIGNATURE__________________________________  ________________________________________________________________________

## 2018-11-06 NOTE — Telephone Encounter (Signed)
Colletta Maryland with [facility] called asking if we could do a medical clearance on Ms. Raffield.  She is scheduled for a toe amputation Monday.  She has been cleared by cardiology by his last office notes. They want  to know if you need to see her or can you clear her by her last visit with you.  Colletta Maryland is faxing the request to you now.  CB#  [phone] .    Con Memos

## 2018-11-07 ENCOUNTER — Ambulatory Visit: Payer: Federal, State, Local not specified - PPO | Admitting: Internal Medicine

## 2018-11-07 ENCOUNTER — Other Ambulatory Visit: Payer: Self-pay

## 2018-11-07 ENCOUNTER — Encounter (HOSPITAL_COMMUNITY): Payer: Self-pay

## 2018-11-07 ENCOUNTER — Encounter (HOSPITAL_COMMUNITY)
Admission: RE | Admit: 2018-11-07 | Discharge: 2018-11-07 | Disposition: A | Discharge status: Home / Self Care | Payer: Medicare Other | Source: Ambulatory Visit | Attending: Orthopedic Surgery | Admitting: Orthopedic Surgery

## 2018-11-07 DIAGNOSIS — I4891 Unspecified atrial fibrillation: Secondary | ICD-10-CM | POA: Insufficient documentation

## 2018-11-07 DIAGNOSIS — Z7982 Long term (current) use of aspirin: Secondary | ICD-10-CM | POA: Insufficient documentation

## 2018-11-07 DIAGNOSIS — Z7984 Long term (current) use of oral hypoglycemic drugs: Secondary | ICD-10-CM | POA: Insufficient documentation

## 2018-11-07 DIAGNOSIS — G4733 Obstructive sleep apnea (adult) (pediatric): Secondary | ICD-10-CM | POA: Diagnosis not present

## 2018-11-07 DIAGNOSIS — E1169 Type 2 diabetes mellitus with other specified complication: Secondary | ICD-10-CM | POA: Insufficient documentation

## 2018-11-07 DIAGNOSIS — Z01818 Encounter for other preprocedural examination: Secondary | ICD-10-CM | POA: Insufficient documentation

## 2018-11-07 DIAGNOSIS — I10 Essential (primary) hypertension: Secondary | ICD-10-CM | POA: Insufficient documentation

## 2018-11-07 DIAGNOSIS — E119 Type 2 diabetes mellitus without complications: Secondary | ICD-10-CM | POA: Diagnosis not present

## 2018-11-07 DIAGNOSIS — Z79899 Other long term (current) drug therapy: Secondary | ICD-10-CM | POA: Diagnosis not present

## 2018-11-07 DIAGNOSIS — M869 Osteomyelitis, unspecified: Secondary | ICD-10-CM | POA: Diagnosis not present

## 2018-11-07 DIAGNOSIS — E785 Hyperlipidemia, unspecified: Secondary | ICD-10-CM | POA: Diagnosis not present

## 2018-11-07 HISTORY — DX: Cardiac arrhythmia, unspecified: I49.9

## 2018-11-07 HISTORY — DX: Sleep apnea, unspecified: G47.30

## 2018-11-07 LAB — CBC
HCT: 39.3 % (ref 36.0–46.0)
Hemoglobin: 12.5 g/dL (ref 12.0–15.0)
MCH: 31.8 pg (ref 26.0–34.0)
MCHC: 31.8 g/dL (ref 30.0–36.0)
MCV: 100 fL (ref 80.0–100.0)
Platelets: 136 10*3/uL — ABNORMAL LOW (ref 150–400)
RBC: 3.93 MIL/uL (ref 3.87–5.11)
RDW: 12.3 % (ref 11.5–15.5)
WBC: 6.4 10*3/uL (ref 4.0–10.5)
nRBC: 0 % (ref 0.0–0.2)

## 2018-11-07 LAB — BASIC METABOLIC PANEL
Anion gap: 9 (ref 5–15)
BUN: 28 mg/dL — AB (ref 8–23)
CO2: 27 mmol/L (ref 22–32)
Calcium: 10 mg/dL (ref 8.9–10.3)
Chloride: 104 mmol/L (ref 98–111)
Creatinine, Ser: 0.99 mg/dL (ref 0.44–1.00)
GFR calc Af Amer: >60 mL/min (ref >60)
GFR calc non Af Amer: 57 mL/min — ABNORMAL LOW (ref >60)
Glucose, Bld: 138 mg/dL — ABNORMAL HIGH (ref 70–99)
Potassium: 4.8 mmol/L (ref 3.5–5.1)
Sodium: 140 mmol/L (ref 135–145)

## 2018-11-07 NOTE — Progress Notes (Signed)
CHRIS, NARASIMHAN ([id number]) Visit Report for 11/05/2018 Chief Complaint Document Details Patient Name: Stacie Porter, Stacie Porter Date of Service: 11/05/2018 9:00 AM Medical Record Number: [id number] Patient Account Number: [id number] Date of Birth/Sex: 1945/11/16 (73 y.o. F) Treating RN: Montey Hora Primary Care Provider: Fenton Malling Other Clinician: Referring Provider: Fenton Malling Treating Provider/Extender: Melburn Hake, Lennox Leikam Weeks in Treatment: 3 Information Obtained from: Patient Chief Complaint Right 2nd Toe Ulcer Electronic Signature(s) Signed: 11/06/2018 1:14:32 PM By: Worthy Keeler PA-C Entered By: Worthy Keeler on 11/05/2018 09:09:19 Reale, Ashantee W. ([id number]) -------------------------------------------------------------------------------- HPI Details Patient Name: Stacie Porter Date of Service: 11/05/2018 9:00 AM Medical Record Number: [id number] Patient Account Number: [id number] Date of Birth/Sex: 19-Feb-1946 (73 y.o. F) Treating RN: Montey Hora Primary Care Provider: Fenton Malling Other Clinician: Referring Provider: Fenton Malling Treating Provider/Extender: Melburn Hake, Therin Vetsch Weeks in Treatment: 3 History of Present Illness HPI Description: 10/10/18 patient presents today for initial evaluation our clinic concerning an ulcer that she has on her right second toe on the medial/plantar aspect. This has been present for a couple of months according to the patient. She has been seeing Dr. Vickki Muff who has recommended amputation. The patient however really was not interested in proceeding with an application surgery. For that reason she decided to potentially get a second opinion for the wound center but had wanted to speak with her primary care provider first. She subsequently had her appointment with her PCP and as a result of that conversation it was determined that she would come to the wound center for a second opinion to see if we agreed with the assessment  of having pain if you take or not. The patient is really not having any significant pain although she does state that there is some discomfort. No fevers, chills, nausea, or vomiting noted at this time. She has a history of hypertension, diabetes mellitus type II, and atrial fibrillation for which she has undergone an ablation. She seems to have a desire to want to salvage the toe if it all possible there is a deformity of this region where the second toe overlaps the first which has caused the pressure and subsequently the wound that we are currently seeing. I did review patient's x-ray as performed by Dr. Vickki Muff which resulted in some question of osseous destruction of the second toe. Nonetheless this was not definitive. 10/17/18 on evaluation today patient's toe actually appears to be doing better in my pinion. She seems to show some signs of epithelialization around the edge of the toe I do believe that she is much less callous although there was a little bit of dry skin and dressing surrounding the wound opening. I think that is just a matter of not putting as much of the Prisma all around but just put it more in the central portion of the wound that will help prevent that type of thing from occurring. In general the maceration between the toes appears to be greatly improved. Overall very pleased in this regard. Her MRI was scheduled originally for Saturday which is two days away she changed this to next Tuesday. She will have this done however before I see her back for follow-up next Thursday. 10/24/18 on evaluation today patient presents for follow-up concerning her right second toe ulcer. On evaluation today patient's wound bed actually showed signs of continued exposure of the tenant although there does not appear to be any drying out a breakdown of the tenant at this time which is good news.  I did review her MRI which was obtained two days ago which does show that she has osteomyelitis of the  entire toe fortunately this does not seem to proceed into the foot proper. The good news is she's not having any significant pain which is excellent. The bad news is obviously there is infection she is going to need to continue antibiotics and at minimum we probably need to go ahead and see about making a referral to infectious disease. She also has to make up her mind as far as whether or not she would like to see a surgeon for the possibility of amputation which is another option especially considering the deformity of the second toe which wraps onto the dorsal surface of the great toe which is what appears to have caused the callous and subsequently this wound to begin with. Fortunately there is no evidence of systemic infection at this point. She does seem to be tolerating the doxycycline without complication. 10/29/18 on evaluation today patient appears to be doing about the same in regard to her toe ulcer. It is not appear to be any evidence of infection at this time which is good news. She's been tolerating the dressing changes without complication. With that being said we are not really seeing any significant improvement this time as far as the overall measurements are concerned. I did have a conversation with her quite extensively she would like to see Dr. Berenice Primas, orthopedic surgery, to discuss further options from the standpoint of amputation. She does believe this is the direction she would prefer to go especially in light of the foot deformity with the second toe overlapping the first as it is. 11/05/18 on evaluation today patient presents for follow-up concerning her ongoing issues with her right second toe. She did see Dr. Berenice Primas yesterday and he is planning for reputation. He actually suggested tomorrow although she was not able to do tomorrow. 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 Otherwise  she thinks this is definitely gonna be the best plan for her at this time. I tend to agree especially in light of the fact that her toes seems to be rotating and now sticking almost straight up which I think may even be a pathologic fracture. Royals, Tisa W. ([id number]) Electronic Signature(s) Signed: 11/06/2018 1:14:32 PM By: Worthy Keeler PA-C Entered By: Worthy Keeler on 11/05/2018 09:39:30 Nunnery, Roxanne W. ([id number]) -------------------------------------------------------------------------------- Physical Exam Details Patient Name: Stacie Porter Date of Service: 11/05/2018 9:00 AM Medical Record Number: [id number] Patient Account Number: [id number] Date of Birth/Sex: 05-06-1946 (73 y.o. F) Treating RN: Montey Hora Primary Care Provider: Fenton Malling Other Clinician: Referring Provider: Fenton Malling Treating Provider/Extender: Melburn Hake, Addy Mcmannis Weeks in Treatment: 3 Constitutional Well-nourished and well-hydrated in no acute distress. Respiratory normal breathing without difficulty. clear to auscultation bilaterally. Cardiovascular regular rate and rhythm with normal S1, S2. Psychiatric this patient is able to make decisions and demonstrates good insight into disease process. Alert and Oriented x 3. pleasant and cooperative. Notes Patient's wound bed currently shows evidence of good and decent granulation in my pinion fortunately there's no signs of infection at this time. Overall I think that she does seem to be showing signs of excellent improvement but nonetheless I still think that amputation is the best thing to do especially in light of the new to deformity which would've been a big issue for her ongoing  if she attempted to not go the way of amputation. Electronic Signature(s) Signed: 11/06/2018 1:14:32 PM By: Worthy Keeler PA-C Entered By: Worthy Keeler on 11/05/2018 09:40:09 Ehler, Kenyatte Viona Gilmore  ([id number]) -------------------------------------------------------------------------------- Physician Orders Details Patient Name: Stacie Porter Date of Service: 11/05/2018 9:00 AM Medical Record Number: [id number] Patient Account Number: [id number] Date of Birth/Sex: 1946-06-27 (73 y.o. F) Treating RN: Montey Hora Primary Care Provider: Fenton Malling Other Clinician: Referring Provider: Fenton Malling Treating Provider/Extender: Melburn Hake, Tennyson Wacha Weeks in Treatment: 3 Verbal / Phone Orders: No Diagnosis Coding ICD-10 Coding Code Description E11.621 Type 2 diabetes mellitus with foot ulcer L97.512 Non-pressure chronic ulcer of other part of right foot with fat layer exposed M86.671 Other chronic osteomyelitis, right ankle and foot I48.20 Chronic atrial fibrillation, unspecified Wound Cleansing Wound #1 Right,Plantar Toe Second o Clean wound with Normal Saline. Anesthetic (add to Medication List) Wound #1 Right,Plantar Toe Second o Topical Lidocaine 4% cream applied to wound bed prior to debridement (In Clinic Only). Skin Barriers/Peri-Wound Care Wound #1 Right,Plantar Toe Second o Antifungal powder-Nystatin Primary Wound Dressing Wound #1 Right,Plantar Toe Second o Mepitel One Contact layer o Silver Collagen - moistened with hydrogel Secondary Dressing Wound #1 Right,Plantar Toe Second o Conform/Kerlix o Other - Lambs wool in between toes for moisture Dressing Change Frequency Wound #1 Right,Plantar Toe Second o Change dressing every other day. Follow-up Appointments Wound #1 Right,Plantar Toe Second o Other: - Please return if needed Edema Control Wound #1 Right,Plantar Toe Second o Elevate legs to the level of the heart and pump ankles as often as possible Dier, Lissa W. ([id number]) Off-Loading Wound #1 Right,Plantar Toe Second o Open toe surgical shoe with peg assist. Medications-please add to medication list. Wound #1  Right,Plantar Toe Second o P.O. Antibiotics o Other: - Nystatin powder Discharge From [facility] Services o Discharge from [place] Signature(s) Signed: 11/06/2018 1:14:32 PM By: Worthy Keeler PA-C Signed: 11/06/2018 4:23:17 PM By: Montey Hora Entered By: Montey Hora on 11/05/2018 09:35:29 Seese, Corbyn W. ([id number]) -------------------------------------------------------------------------------- Problem List Details Patient Name: Stacie Porter Date of Service: 11/05/2018 9:00 AM Medical Record Number: [id number] Patient Account Number: [id number] Date of Birth/Sex: 1946/02/12 (73 y.o. F) Treating RN: Montey Hora Primary Care Provider: Fenton Malling Other Clinician: Referring Provider: Fenton Malling Treating Provider/Extender: Melburn Hake, Que Meneely Weeks in Treatment: 3 Active Problems ICD-10 Evaluated Encounter Code Description Active Date Today Diagnosis E11.621 Type 2 diabetes mellitus with foot ulcer 10/10/2018 No Yes L97.512 Non-pressure chronic ulcer of other part of right foot with fat 10/10/2018 No Yes layer exposed M86.671 Other chronic osteomyelitis, right ankle and foot 10/24/2018 No Yes I48.20 Chronic atrial fibrillation, unspecified 10/10/2018 No Yes Inactive Problems Resolved Problems Electronic Signature(s) Signed: 11/06/2018 1:14:32 PM By: Worthy Keeler PA-C Entered By: Worthy Keeler on 11/05/2018 09:09:15 Pangelinan, Brisha Viona Gilmore ([id number]) -------------------------------------------------------------------------------- Progress Note Details Patient Name: Stacie Porter Date of Service: 11/05/2018 9:00 AM Medical Record Number: [id number] Patient Account Number: [id number] Date of Birth/Sex: Apr 28, 1946 (73 y.o. F) Treating RN: Montey Hora Primary Care Provider: Fenton Malling Other Clinician: Referring Provider: Fenton Malling Treating Provider/Extender: Melburn Hake, Bailie Christenbury Weeks in Treatment: 3 Subjective Chief  Complaint Information obtained from Patient Right 2nd Toe Ulcer History of Present Illness (HPI) 10/10/18 patient presents today for initial evaluation our clinic concerning an ulcer that she has on her right second toe on the medial/plantar aspect. This has been present for a couple of months according to the patient. She has been seeing  Dr. Vickki Muff who has recommended amputation. The patient however really was not interested in proceeding with an application surgery. For that reason she decided to potentially get a second opinion for the wound center but had wanted to speak with her primary care provider first. She subsequently had her appointment with her PCP and as a result of that conversation it was determined that she would come to the wound center for a second opinion to see if we agreed with the assessment of having pain if you take or not. The patient is really not having any significant pain although she does state that there is some discomfort. No fevers, chills, nausea, or vomiting noted at this time. She has a history of hypertension, diabetes mellitus type II, and atrial fibrillation for which she has undergone an ablation. She seems to have a desire to want to salvage the toe if it all possible there is a deformity of this region where the second toe overlaps the first which has caused the pressure and subsequently the wound that we are currently seeing. I did review patient's x-ray as performed by Dr. Vickki Muff which resulted in some question of osseous destruction of the second toe. Nonetheless this was not definitive. 10/17/18 on evaluation today patient's toe actually appears to be doing better in my pinion. She seems to show some signs of epithelialization around the edge of the toe I do believe that she is much less callous although there was a little bit of dry skin and dressing surrounding the wound opening. I think that is just a matter of not putting as much of the Prisma all  around but just put it more in the central portion of the wound that will help prevent that type of thing from occurring. In general the maceration between the toes appears to be greatly improved. Overall very pleased in this regard. Her MRI was scheduled originally for Saturday which is two days away she changed this to next Tuesday. She will have this done however before I see her back for follow-up next Thursday. 10/24/18 on evaluation today patient presents for follow-up concerning her right second toe ulcer. On evaluation today patient's wound bed actually showed signs of continued exposure of the tenant although there does not appear to be any drying out a breakdown of the tenant at this time which is good news. I did review her MRI which was obtained two days ago which does show that she has osteomyelitis of the entire toe fortunately this does not seem to proceed into the foot proper. The good news is she's not having any significant pain which is excellent. The bad news is obviously there is infection she is going to need to continue antibiotics and at minimum we probably need to go ahead and see about making a referral to infectious disease. She also has to make up her mind as far as whether or not she would like to see a surgeon for the possibility of amputation which is another option especially considering the deformity of the second toe which wraps onto the dorsal surface of the great toe which is what appears to have caused the callous and subsequently this wound to begin with. Fortunately there is no evidence of systemic infection at this point. She does seem to be tolerating the doxycycline without complication. 10/29/18 on evaluation today patient appears to be doing about the same in regard to her toe ulcer. It is not appear to be any evidence of infection at this  time which is good news. She's been tolerating the dressing changes without complication. With that being said we are  not really seeing any significant improvement this time as far as the overall measurements are concerned. I did have a conversation with her quite extensively she would like to see Dr. Berenice Primas, orthopedic surgery, to discuss further options from the standpoint of amputation. She does believe this is the direction she would prefer to go especially in light of the foot deformity with the second toe overlapping the first as it is. 11/05/18 on evaluation today patient presents for follow-up concerning her ongoing issues with her right second toe. She did Vane, [id number]. ([id number]) see Dr. Berenice Primas yesterday and he is planning for reputation. He actually suggested tomorrow although she was not able to do tomorrow. 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 Otherwise she thinks this is definitely gonna be the best plan for her at this time. I tend to agree especially in light of the fact that her toes seems to be rotating and now sticking almost straight up which I think may even be a pathologic fracture. Patient History Information obtained from Patient. Family History Cancer - Siblings, Diabetes - Mother, Heart Disease - Mother,Father, Hypertension - Mother,Father, Lung Disease - Siblings, Stroke - Mother, No family history of Hereditary Spherocytosis, Kidney Disease, Seizures, Thyroid Problems, Tuberculosis. Social History Never smoker, Marital Status - Married, Alcohol Use - Never, Drug Use - No History, Caffeine Use - Never. Medical History Eyes Patient has history of Cataracts - removed Denies history of Glaucoma, Optic Neuritis Ear/Nose/Mouth/Throat Denies history of Chronic sinus problems/congestion, Middle ear problems Hematologic/Lymphatic Denies history of Anemia, Hemophilia, Human Immunodeficiency Virus, Lymphedema, Sickle Cell Disease Respiratory Denies history of Aspiration, Asthma, Chronic  Obstructive Pulmonary Disease (COPD), Pneumothorax, Sleep Apnea, Tuberculosis Cardiovascular Patient has history of Arrhythmia - hx a fib, Hypertension Denies history of Angina, Congestive Heart Failure, Coronary Artery Disease, Deep Vein Thrombosis, Hypotension, Myocardial Infarction, Peripheral Arterial Disease, Peripheral Venous Disease, Phlebitis, Vasculitis Gastrointestinal Denies history of Cirrhosis , Colitis, Crohn s, Hepatitis A, Hepatitis B, Hepatitis C Endocrine Patient has history of Type II Diabetes Denies history of Type I Diabetes Genitourinary Denies history of End Stage Renal Disease Immunological Denies history of Lupus Erythematosus, Raynaud s, Scleroderma Integumentary (Skin) Denies history of History of Burn, History of pressure wounds Musculoskeletal Denies history of Gout, Rheumatoid Arthritis, Osteoarthritis, Osteomyelitis Neurologic Patient has history of Neuropathy Denies history of Dementia, Quadriplegia, Paraplegia, Seizure Disorder Oncologic Denies history of Received Chemotherapy, Received Radiation Psychiatric Denies history of Anorexia/bulimia, Confinement Anxiety Review of Systems (ROS) Constitutional Symptoms (General Health) Denies complaints or symptoms of Fever, Chills. Respiratory The patient has no complaints or symptoms. Cardiovascular Mies, Akita W. ([id number]) The patient has no complaints or symptoms. Psychiatric The patient has no complaints or symptoms. Objective Constitutional Well-nourished and well-hydrated in no acute distress. Vitals Time Taken: 9:01 AM, Height: 65 in, Weight: 226.3 lbs, BMI: 37.7, Temperature: 97.6 F, Pulse: 57 bpm, Respiratory Rate: 16 breaths/min, Blood Pressure: 115/46 mmHg. Respiratory normal breathing without difficulty. clear to auscultation bilaterally. Cardiovascular regular rate and rhythm with normal S1, S2. Psychiatric this patient is able to make decisions and demonstrates good insight  into disease process. Alert and Oriented x 3. pleasant and cooperative. General Notes: Patient's wound bed currently shows evidence of good and decent granulation in my pinion fortunately there's no signs of infection  at this time. Overall I think that she does seem to be showing signs of excellent improvement but nonetheless I still think that amputation is the best thing to do especially in light of the new to deformity which would've been a big issue for her ongoing if she attempted to not go the way of amputation. Integumentary (Hair, Skin) Wound #1 status is Open. Original cause of wound was Gradually Appeared. The wound is located on the Right,Plantar Toe Second. The wound measures 1.2cm length x 0.9cm width x 0.2cm depth; 0.848cm^2 area and 0.17cm^3 volume. There is bone, tendon, and Fat Layer (Subcutaneous Tissue) Exposed exposed. There is no tunneling or undermining noted. There is a medium amount of serous drainage noted. The wound margin is flat and intact. There is small (1-33%) pink granulation within the wound bed. There is a medium (34-66%) amount of necrotic tissue within the wound bed including Adherent Slough. The periwound skin appearance exhibited: Erythema. The periwound skin appearance did not exhibit: Callus, Crepitus, Excoriation, Induration, Rash, Scarring, Dry/Scaly, Maceration, Atrophie Blanche, Cyanosis, Ecchymosis, Hemosiderin Staining, Mottled, Pallor, Rubor. The surrounding wound skin color is noted with erythema which is circumferential. Periwound temperature was noted as No Abnormality. The periwound has tenderness on palpation. Assessment Active Problems ICD-10 Type 2 diabetes mellitus with foot ulcer Non-pressure chronic ulcer of other part of right foot with fat layer exposed Arnett, Syrai W. ([id number]) Other chronic osteomyelitis, right ankle and foot Chronic atrial fibrillation, unspecified Plan Wound Cleansing: Wound #1 Right,Plantar Toe Second: Clean  wound with Normal Saline. Anesthetic (add to Medication List): Wound #1 Right,Plantar Toe Second: Topical Lidocaine 4% cream applied to wound bed prior to debridement (In Clinic Only). Skin Barriers/Peri-Wound Care: Wound #1 Right,Plantar Toe Second: Antifungal powder-Nystatin Primary Wound Dressing: Wound #1 Right,Plantar Toe Second: Mepitel One Contact layer Silver Collagen - moistened with hydrogel Secondary Dressing: Wound #1 Right,Plantar Toe Second: Conform/Kerlix Other - Lambs wool in between toes for moisture Dressing Change Frequency: Wound #1 Right,Plantar Toe Second: Change dressing every other day. Follow-up Appointments: Wound #1 Right,Plantar Toe Second: Other: - Please return if needed Edema Control: Wound #1 Right,Plantar Toe Second: Elevate legs to the level of the heart and pump ankles as often as possible Off-Loading: Wound #1 Right,Plantar Toe Second: Open toe surgical shoe with peg assist. Medications-please add to medication list.: Wound #1 Right,Plantar Toe Second: P.O. Antibiotics Other: - Nystatin powder Discharge From [facility] Services: Discharge from [place] My suggestion currently is gonna be that we go ahead and just discontinue wound care services as she will likely be having the appointment for amputation next week. If she has any concerns in the meantime she knows contact the office and let me know. Otherwise we will continue with the above wound care measures for the next week. That is until she has the invitation. Electronic Signature(s) Signed: 11/06/2018 1:14:32 PM By: Arlean Hopping, Rodnesha W. ([id number]) Entered By: Worthy Keeler on 11/05/2018 09:40:45 Diekman, Shylee W. ([id number]) -------------------------------------------------------------------------------- ROS/PFSH Details Patient Name: Stacie Porter Date of Service: 11/05/2018 9:00 AM Medical Record Number: [id number] Patient Account Number: [id number] Date of  Birth/Sex: August 08, 1946 (73 y.o. F) Treating RN: Montey Hora Primary Care Provider: Fenton Malling Other Clinician: Referring Provider: Fenton Malling Treating Provider/Extender: Melburn Hake, Prakash Kimberling Weeks in Treatment: 3 Information Obtained From Patient Wound History Do you currently have one or more open woundso Yes How many open wounds do you currently haveo 1 Approximately how long have you had your woundso  2 months How have you been treating your wound(s) until nowo neosporin Has your wound(s) ever healed and then re-openedo No Have you had any lab work done in the past montho No Have you tested positive for an antibiotic resistant organism (MRSA, VRE)o No Have you tested positive for osteomyelitis (bone infection)o No Have you had any tests for circulation on your legso No Have you had other problems associated with your woundso Swelling Constitutional Symptoms (General Health) Complaints and Symptoms: Negative for: Fever; Chills Eyes Medical History: Positive for: Cataracts - removed Negative for: Glaucoma; Optic Neuritis Ear/Nose/Mouth/Throat Medical History: Negative for: Chronic sinus problems/congestion; Middle ear problems Hematologic/Lymphatic Medical History: Negative for: Anemia; Hemophilia; Human Immunodeficiency Virus; Lymphedema; Sickle Cell Disease Respiratory Complaints and Symptoms: No Complaints or Symptoms Medical History: Negative for: Aspiration; Asthma; Chronic Obstructive Pulmonary Disease (COPD); Pneumothorax; Sleep Apnea; Tuberculosis Cardiovascular Complaints and Symptoms: No Complaints or Symptoms Schaben, Alika W. ([id number]) Medical History: Positive for: Arrhythmia - hx a fib; Hypertension Negative for: Angina; Congestive Heart Failure; Coronary Artery Disease; Deep Vein Thrombosis; Hypotension; Myocardial Infarction; Peripheral Arterial Disease; Peripheral Venous Disease; Phlebitis; Vasculitis Gastrointestinal Medical  History: Negative for: Cirrhosis ; Colitis; Crohnos; Hepatitis A; Hepatitis B; Hepatitis C Endocrine Medical History: Positive for: Type II Diabetes Negative for: Type I Diabetes Treated with: Oral agents Genitourinary Medical History: Negative for: End Stage Renal Disease Immunological Medical History: Negative for: Lupus Erythematosus; Raynaudos; Scleroderma Integumentary (Skin) Medical History: Negative for: History of Burn; History of pressure wounds Musculoskeletal Medical History: Negative for: Gout; Rheumatoid Arthritis; Osteoarthritis; Osteomyelitis Neurologic Medical History: Positive for: Neuropathy Negative for: Dementia; Quadriplegia; Paraplegia; Seizure Disorder Oncologic Medical History: Negative for: Received Chemotherapy; Received Radiation Psychiatric Complaints and Symptoms: No Complaints or Symptoms Medical History: Negative for: Anorexia/bulimia; Confinement Anxiety HBO Extended History Items Doughman, Floria W. ([id number]) Eyes: Cataracts Immunizations Pneumococcal Vaccine: Received Pneumococcal Vaccination: Yes Immunization Notes: up to date Implantable Devices No devices added Family and Social History Cancer: Yes - Siblings; Diabetes: Yes - Mother; Heart Disease: Yes - Mother,Father; Hereditary Spherocytosis: No; Hypertension: Yes - Mother,Father; Kidney Disease: No; Lung Disease: Yes - Siblings; Seizures: No; Stroke: Yes - Mother; Thyroid Problems: No; Tuberculosis: No; Never smoker; Marital Status - Married; Alcohol Use: Never; Drug Use: No History; Caffeine Use: Never; Financial Concerns: No; Food, Clothing or Shelter Needs: No; Support System Lacking: No; Transportation Concerns: No; Advanced Directives: Yes (Not Provided); Patient does not want information on Advanced Directives; Living Will: Yes (Not Provided); Medical Power of Attorney: Yes - spouse (Not Provided) Physician Affirmation I have reviewed and agree with the above  information. Electronic Signature(s) Signed: 11/06/2018 1:14:32 PM By: Worthy Keeler PA-C Signed: 11/06/2018 4:23:17 PM By: Montey Hora Entered By: Worthy Keeler on 11/05/2018 09:39:46 Yount, Kaileena W. ([id number]) -------------------------------------------------------------------------------- SuperBill Details Patient Name: Stacie Porter Date of Service: 11/05/2018 Medical Record Number: [id number] Patient Account Number: [id number] Date of Birth/Sex: 10-May-1946 (73 y.o. 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, right ankle and foot I48.20 Chronic atrial fibrillation, unspecified Facility Procedures CPT4 Code: 29937169 Description: 99213 - WOUND CARE VISIT-LEV 3 EST PT Modifier: Quantity: 1 Physician Procedures CPT4 Code: 6789381 Description: 99214 - WC PHYS LEVEL 4 - EST PT ICD-10 Diagnosis  Description E11.621 Type 2 diabetes mellitus with foot ulcer L97.512 Non-pressure chronic ulcer of other part of right foot with fa M86.671 Other chronic osteomyelitis, right ankle and foot  I48.20 Chronic atrial fibrillation, unspecified Modifier: t layer exposed Quantity: 1 Electronic Signature(s) Signed: 11/06/2018 1:14:32 PM By: Worthy Keeler PA-C Entered By: Worthy Keeler on 11/05/2018 09:41:08

## 2018-11-07 NOTE — Progress Notes (Signed)
PCP: Fenton Malling PA-C   CARDIOLOGIST:Dr. Nehemiah Massed  INFO IN Epic: BUN [id number]  INFO ON CHART: HX : HTN ,DM  BLOOD THINNERS AND LAST DOSES: ____________________________________  PATIENT SYMPTOMS AT TIME OF PREOP: NONE

## 2018-11-07 NOTE — Progress Notes (Signed)
Clrearance and LOV  Note Dr. Nehemiah Massed 09-03-18  On chart Echo 09-03-18 on chart  ekg 12-17=19 chart

## 2018-11-07 NOTE — Progress Notes (Signed)
ULA, COUVILLON ([id number]) Visit Report for 11/05/2018 Arrival Information Details Patient Name: Stacie Porter, Stacie Porter Date of Service: 11/05/2018 9:00 AM Medical Record Number: [id number] Patient Account Number: [id number] Date of Birth/Sex: June 10, 1946 (73 y.o. F) Treating RN: Cornell Barman Primary Care Anacristina Steffek: Fenton Malling Other Clinician: Referring Floye Fesler: Fenton Malling Treating Marissa Lowrey/Extender: Melburn Hake, HOYT Weeks in Treatment: 3 Visit Information History Since Last Visit Added or deleted any medications: No Patient Arrived: Ambulatory Any new allergies or adverse reactions: No Arrival Time: 08:59 Had a fall or experienced change in No Accompanied By: self activities of daily living that may affect Transfer Assistance: None risk of falls: Patient Identification Verified: Yes Signs or symptoms of abuse/neglect since last No Secondary Verification Process Yes visito Completed: Hospitalized since last visit: No Patient Has Alerts: Yes Has Dressing in Place as Prescribed: Yes Patient Alerts: Patient on Blood Has Footwear/Offloading in Place as Yes Thinner Prescribed: DM II Right: Surgical Shoe with aspirin 81 Pressure Relief Insole Pain Present Now: No Electronic Signature(s) Signed: 11/05/2018 6:07:40 PM By: Gretta Cool, BSN, RN, CWS, Kim RN, BSN Entered By: Gretta Cool, BSN, RN, CWS, Kim on 11/05/2018 09:00:27 Pickerel, Seirra Viona Gilmore ([id number]) -------------------------------------------------------------------------------- Clinic Level of Care Assessment Details Patient Name: Stacie Porter Date of Service: 11/05/2018 9:00 AM Medical Record Number: [id number] Patient Account Number: [id number] Date of Birth/Sex: 30-Jul-1946 (73 y.o. F) Treating RN: Montey Hora Primary Care Jacksyn Beeks: Fenton Malling Other Clinician: Referring Jaysion Ramseyer: Fenton Malling Treating Weslynn Ke/Extender: Melburn Hake, HOYT Weeks in Treatment: 3 Clinic Level of Care Assessment Items TOOL 4 Quantity  Score []  - Use when only an EandM is performed on FOLLOW-UP visit 0 ASSESSMENTS - Nursing Assessment / Reassessment X - Reassessment of Co-morbidities (includes updates in patient status) 1 10 X- 1 5 Reassessment of Adherence to Treatment Plan ASSESSMENTS - Wound and Skin Assessment / Reassessment X - Simple Wound Assessment / Reassessment - one wound 1 5 []  - 0 Complex Wound Assessment / Reassessment - multiple wounds []  - 0 Dermatologic / Skin Assessment (not related to wound area) ASSESSMENTS - Focused Assessment []  - Circumferential Edema Measurements - multi extremities 0 []  - 0 Nutritional Assessment / Counseling / Intervention X- 1 5 Lower Extremity Assessment (monofilament, tuning fork, pulses) []  - 0 Peripheral Arterial Disease Assessment (using hand held doppler) ASSESSMENTS - Ostomy and/or Continence Assessment and Care []  - Incontinence Assessment and Management 0 []  - 0 Ostomy Care Assessment and Management (repouching, etc.) PROCESS - Coordination of Care X - Simple Patient / Family Education for ongoing care 1 15 []  - 0 Complex (extensive) Patient / Family Education for ongoing care X- 1 10 Staff obtains Programmer, systems, Records, Test Results / Process Orders []  - 0 Staff telephones HHA, Nursing Homes / Clarify orders / etc []  - 0 Routine Transfer to another Facility (non-emergent condition) []  - 0 Routine Hospital Admission (non-emergent condition) []  - 0 New Admissions / Biomedical engineer / Ordering NPWT, Apligraf, etc. []  - 0 Emergency Hospital Admission (emergent condition) X- 1 10 Simple Discharge Coordination Aleshire, Joanann W. ([id number]) []  - 0 Complex (extensive) Discharge Coordination PROCESS - Special Needs []  - Pediatric / Minor Patient Management 0 []  - 0 Isolation Patient Management []  - 0 Hearing / Language / Visual special needs []  - 0 Assessment of Community assistance (transportation, D/C planning, etc.) []  - 0 Additional assistance  / Altered mentation []  - 0 Support Surface(s) Assessment (bed, cushion, seat, etc.) INTERVENTIONS - Wound Cleansing / Measurement X - Simple Wound Cleansing - one  wound 1 5 []  - 0 Complex Wound Cleansing - multiple wounds X- 1 5 Wound Imaging (photographs - any number of wounds) []  - 0 Wound Tracing (instead of photographs) X- 1 5 Simple Wound Measurement - one wound []  - 0 Complex Wound Measurement - multiple wounds INTERVENTIONS - Wound Dressings X - Small Wound Dressing one or multiple wounds 1 10 []  - 0 Medium Wound Dressing one or multiple wounds []  - 0 Large Wound Dressing one or multiple wounds X- 1 5 Application of Medications - topical []  - 0 Application of Medications - injection INTERVENTIONS - Miscellaneous []  - External ear exam 0 []  - 0 Specimen Collection (cultures, biopsies, blood, body fluids, etc.) []  - 0 Specimen(s) / Culture(s) sent or taken to Lab for analysis []  - 0 Patient Transfer (multiple staff / Civil Service fast streamer / Similar devices) []  - 0 Simple Staple / Suture removal (25 or less) []  - 0 Complex Staple / Suture removal (26 or more) []  - 0 Hypo / Hyperglycemic Management (close monitor of Blood Glucose) []  - 0 Ankle / Brachial Index (ABI) - do not check if billed separately X- 1 5 Vital Signs Mccluney, Chrisy W. ([id number]) Has the patient been seen at the hospital within the last three years: Yes Total Score: 95 Level Of Care: New/Established - Level 3 Electronic Signature(s) Signed: 11/06/2018 4:23:17 PM By: Montey Hora Entered By: Montey Hora on 11/05/2018 09:36:15 Bergdoll, Malissia W. ([id number]) -------------------------------------------------------------------------------- Encounter Discharge Information Details Patient Name: Stacie Porter Date of Service: 11/05/2018 9:00 AM Medical Record Number: [id number] Patient Account Number: [id number] Date of Birth/Sex: 16-Aug-1946 (73 y.o. F) Treating RN: Montey Hora Primary Care Latayna Ritchie:  Fenton Malling Other Clinician: Referring Charlize Hathaway: Fenton Malling Treating Elby Blackwelder/Extender: Melburn Hake, HOYT Weeks in Treatment: 3 Encounter Discharge Information Items Discharge Condition: Stable Ambulatory Status: Ambulatory Discharge Destination: Home Transportation: Private Auto Accompanied By: self Schedule Follow-up Appointment: Yes Clinical Summary of Care: Electronic Signature(s) Signed: 11/06/2018 4:23:17 PM By: Montey Hora Entered By: Montey Hora on 11/05/2018 09:37:17 Mcdermid, Kerrigan W. ([id number]) -------------------------------------------------------------------------------- Lower Extremity Assessment Details Patient Name: Stacie Porter Date of Service: 11/05/2018 9:00 AM Medical Record Number: [id number] Patient Account Number: [id number] Date of Birth/Sex: 06-01-1946 (73 y.o. F) Treating RN: Cornell Barman Primary Care Leane Loring: Fenton Malling Other Clinician: Referring Jef Futch: Fenton Malling Treating Graylin Sperling/Extender: Melburn Hake, HOYT Weeks in Treatment: 3 Vascular Assessment Pulses: Dorsalis Pedis Palpable: [Right:Yes] Posterior Tibial Extremity colors, hair growth, and conditions: Extremity Color: [Right:Normal] Hair Growth on Extremity: [Right:Yes] Temperature of Extremity: [Right:Warm] Capillary Refill: [Right:< 3 seconds] Toe Nail Assessment Left: Right: Thick: No Discolored: No Deformed: No Improper Length and Hygiene: No Electronic Signature(s) Signed: 11/05/2018 6:07:40 PM By: Gretta Cool, BSN, RN, CWS, Kim RN, BSN Entered By: Gretta Cool, BSN, RN, CWS, Kim on 11/05/2018 09:06:33 Laughner, Darcee Viona Gilmore ([id number]) -------------------------------------------------------------------------------- Multi Wound Chart Details Patient Name: Stacie Porter Date of Service: 11/05/2018 9:00 AM Medical Record Number: [id number] Patient Account Number: [id number] Date of Birth/Sex: 1946-05-23 (73 y.o. F) Treating RN: Montey Hora Primary Care Danisha Brassfield:  Fenton Malling Other Clinician: Referring Berlyn Saylor: Fenton Malling Treating Caryl Fate/Extender: Melburn Hake, HOYT Weeks in Treatment: 3 Vital Signs Height(in): 65 Pulse(bpm): 80 Weight(lbs): 226.3 Blood Pressure(mmHg): 115/46 Body Mass Index(BMI): 38 Temperature(F): 97.6 Respiratory Rate 16 (breaths/min): Photos: [N/A:N/A] Wound Location: Right Toe Second - Plantar N/A N/A Wounding Event: Gradually Appeared N/A N/A Primary Etiology: Diabetic Wound/Ulcer of the N/A N/A Lower Extremity Comorbid History: Cataracts, Arrhythmia, N/A N/A Hypertension, Type II Diabetes, Neuropathy Date Acquired: 08/09/2018  N/A N/A Weeks of Treatment: 3 N/A N/A Wound Status: Open N/A N/A Pending Amputation on Yes N/A N/A Presentation: Measurements L x W x D 1.2x0.9x0.2 N/A N/A (cm) Area (cm) : 0.848 N/A N/A Volume (cm) : 0.17 N/A N/A % Reduction in Area: -35.00% N/A N/A % Reduction in Volume: 9.60% N/A N/A Classification: Grade 3 N/A N/A Exudate Amount: Medium N/A N/A Exudate Type: Serous N/A N/A Exudate Color: amber N/A N/A Wound Margin: Flat and Intact N/A N/A Granulation Amount: Small (1-33%) N/A N/A Granulation Quality: Pink N/A N/A Necrotic Amount: Medium (34-66%) N/A N/A Exposed Structures: Fat Layer (Subcutaneous N/A N/A Tissue) Exposed: Yes Tendon: Yes Senters, Madoline W. ([id number]) Bone: Yes Fascia: No Muscle: No Joint: No Epithelialization: None N/A N/A Periwound Skin Texture: Excoriation: No N/A N/A Induration: No Callus: No Crepitus: No Rash: No Scarring: No Periwound Skin Moisture: Maceration: No N/A N/A Dry/Scaly: No Periwound Skin Color: Erythema: Yes N/A N/A Atrophie Blanche: No Cyanosis: No Ecchymosis: No Hemosiderin Staining: No Mottled: No Pallor: No Rubor: No Erythema Location: Circumferential N/A N/A Temperature: No Abnormality N/A N/A Tenderness on Palpation: Yes N/A N/A Wound Preparation: Ulcer Cleansing: N/A N/A Rinsed/Irrigated with  Saline Topical Anesthetic Applied: Other: lidocaine 4% Treatment Notes Electronic Signature(s) Signed: 11/06/2018 4:23:17 PM By: Montey Hora Entered By: Montey Hora on 11/05/2018 09:27:59 Samaras, Makalynn Viona Gilmore ([id number]) -------------------------------------------------------------------------------- [place] Details Patient Name: Stacie Porter Date of Service: 11/05/2018 9:00 AM Medical Record Number: [id number] Patient Account Number: [id number] Date of Birth/Sex: 03/06/1946 (73 y.o. F) Treating RN: Montey Hora Primary Care Petar Mucci: Fenton Malling Other Clinician: Referring Dazja Houchin: Fenton Malling Treating Fianna Snowball/Extender: Melburn Hake, HOYT Weeks in Treatment: 3 Active Inactive Electronic Signature(s) Signed: 11/05/2018 1:43:48 PM By: Gretta Cool, BSN, RN, CWS, Kim RN, BSN Signed: 11/06/2018 4:23:17 PM By: Montey Hora Entered By: Gretta Cool BSN, RN, CWS, Kim on 11/05/2018 13:43:48 Klimas, Lataria W. ([id number]) -------------------------------------------------------------------------------- Pain Assessment Details Patient Name: Stacie Porter Date of Service: 11/05/2018 9:00 AM Medical Record Number: [id number] Patient Account Number: [id number] Date of Birth/Sex: May 07, 1946 (73 y.o. F) Treating RN: Cornell Barman Primary Care Alissa Pharr: Fenton Malling Other Clinician: Referring Deanie Jupiter: Fenton Malling Treating Ranson Belluomini/Extender: Melburn Hake, HOYT Weeks in Treatment: 3 Active Problems Location of Pain Severity and Description of Pain Patient Has Paino No Site Locations Pain Management and Medication Current Pain Management: Goals for Pain Management Patient denies any pain at this time. Electronic Signature(s) Signed: 11/05/2018 6:07:40 PM By: Gretta Cool, BSN, RN, CWS, Kim RN, BSN Entered By: Gretta Cool, BSN, RN, CWS, Kim on 11/05/2018 09:00:42 Demirjian, Leandra Kern  ([id number]) -------------------------------------------------------------------------------- Patient/Caregiver Education Details Patient Name: Stacie Porter Date of Service: 11/05/2018 9:00 AM Medical Record Number: [id number] Patient Account Number: [id number] Date of Birth/Gender: 1946-06-08 (73 y.o. F) Treating RN: Montey Hora Primary Care Physician: Fenton Malling Other Clinician: Referring Physician: Fenton Malling Treating Physician/Extender: Sharalyn Ink in Treatment: 3 Education Assessment Education Provided To: Patient Education Topics Provided Wound/Skin Impairment: Handouts: Other: wound care until amputation Methods: Demonstration, Explain/Verbal Responses: State content correctly Electronic Signature(s) Signed: 11/06/2018 4:23:17 PM By: Montey Hora Entered By: Montey Hora on 11/05/2018 09:36:38 Larke, Lilou W. ([id number]) -------------------------------------------------------------------------------- Wound Assessment Details Patient Name: Stacie Porter Date of Service: 11/05/2018 9:00 AM Medical Record Number: [id number] Patient Account Number: [id number] Date of Birth/Sex: Jul 09, 1946 (73 y.o. F) Treating RN: Cornell Barman Primary Care Roshanna Cimino: Fenton Malling Other Clinician: Referring Mekisha Bittel: Fenton Malling Treating Czarina Gingras/Extender: Melburn Hake, HOYT Weeks in Treatment: 3 Wound Status Wound Number: 1 Primary Diabetic Wound/Ulcer  of the Lower Extremity Etiology: Wound Location: Right Toe Second - Plantar Wound Status: Open Wounding Event: Gradually Appeared Comorbid Cataracts, Arrhythmia, Hypertension, Type II Date Acquired: 08/09/2018 History: Diabetes, Neuropathy Weeks Of Treatment: 3 Clustered Wound: No Pending Amputation On Presentation Photos Photo Uploaded By: Gretta Cool, BSN, RN, CWS, Kim on 11/05/2018 09:10:24 Wound Measurements Length: (cm) 1.2 Width: (cm) 0.9 Depth: (cm) 0.2 Area: (cm) 0.848 Volume: (cm)  0.17 % Reduction in Area: -35% % Reduction in Volume: 9.6% Epithelialization: None Tunneling: No Undermining: No Wound Description Classification: Grade 3 Foul Odor Wound Margin: Flat and Intact Slough/Fi Exudate Amount: Medium Exudate Type: Serous Exudate Color: amber After Cleansing: No brino No Wound Bed Granulation Amount: Small (1-33%) Exposed Structure Granulation Quality: Pink Fascia Exposed: No Necrotic Amount: Medium (34-66%) Fat Layer (Subcutaneous Tissue) Exposed: Yes Necrotic Quality: Adherent Slough Tendon Exposed: Yes Muscle Exposed: No Joint Exposed: No Bone Exposed: Yes Periwound Skin Texture Fischler, Deeanne W. ([id number]) Texture Color No Abnormalities Noted: No No Abnormalities Noted: No Callus: No Atrophie Blanche: No Crepitus: No Cyanosis: No Excoriation: No Ecchymosis: No Induration: No Erythema: Yes Rash: No Erythema Location: Circumferential Scarring: No Hemosiderin Staining: No Mottled: No Moisture Pallor: No No Abnormalities Noted: No Rubor: No Dry / Scaly: No Maceration: No Temperature / Pain Temperature: No Abnormality Tenderness on Palpation: Yes Wound Preparation Ulcer Cleansing: Rinsed/Irrigated with Saline Topical Anesthetic Applied: Other: lidocaine 4%, Electronic Signature(s) Signed: 11/05/2018 6:07:40 PM By: Gretta Cool, BSN, RN, CWS, Kim RN, BSN Entered By: Gretta Cool, BSN, RN, CWS, Kim on 11/05/2018 09:05:33 Wieman, Janelie Viona Gilmore ([id number]) -------------------------------------------------------------------------------- [place] Details Patient Name: Stacie Porter Date of Service: 11/05/2018 9:00 AM Medical Record Number: [id number] Patient Account Number: [id number] Date of Birth/Sex: [birth year] (73 y.o. F) Treating RN: Cornell Barman Primary Care Lycan Davee: Fenton Malling Other Clinician: Referring Daneshia Tavano: Fenton Malling Treating Drystan Reader/Extender: Melburn Hake, HOYT Weeks in Treatment: 3 Vital Signs Time Taken: 09:01 Temperature  (F): 97.6 Height (in): 65 Pulse (bpm): 57 Weight (lbs): 226.3 Respiratory Rate (breaths/min): 16 Body Mass Index (BMI): 37.7 Blood Pressure (mmHg): 115/46 Reference Range: 80 - 120 mg / dl Electronic Signature(s) Signed: 11/05/2018 6:07:40 PM By: Gretta Cool, BSN, RN, CWS, Kim RN, BSN Entered By: Gretta Cool, BSN, RN, CWS, Kim on 11/05/2018 09:02:16

## 2018-11-08 NOTE — Progress Notes (Signed)
Anesthesia Chart Review   Case:  [id number] Date/Time:  11/11/18 1500   Procedure:  AMPUTATION RIGHT 2ND TOE (Right )   Anesthesia type:  General   Pre-op diagnosis:  RIGHT 2ND TOE OSTEOMYELITIS WITH MEDIAL DRAINING WOUND   Location:  [facility] ROOM 03 / [facility]   Surgeon:  Dorna Leitz, MD      DISCUSSION:73 yo never smoker with h/o HLD, DM II, HTN, OSA w/CPAP, A-fib, right 2nd tow osteomyelitis scheduled for above procedure 11/11/18 with Dr. Dorna Leitz.   Pt last seen by cardiologist, Dr. Sarina Ill, 09/13/18.  Per OV note, "Overall risk of cardiac complication with surgery and/or procedure is low, <1%."   Pt can proceed with planned procedure barring acute status change.  VS: BP (!) 128/53 (BP Location: Right Arm)   Pulse 62   Temp 36.6 C (Oral)   Resp 18   Ht 5\' 5"  (1.651 m)   Wt 100.9 kg   SpO2 98%   BMI 37.03 kg/m   PROVIDERS: Mar Daring, PA-C is PCP   Serafina Royals, MD is Cardiologist  LABS: Labs reviewed: Acceptable for surgery. (all labs ordered are listed, but only abnormal results are displayed)  Labs Reviewed  CBC - Abnormal; Notable for the following components:      Result Value   Platelets 136 (*)    All other components within normal limits  BASIC METABOLIC PANEL - Abnormal; Notable for the following components:   Glucose, Bld 138 (*)    BUN 28 (*)    GFR calc non Af Amer 57 (*)    All other components within normal limits     IMAGES: MR Toes Right 10/22/2018 IMPRESSION: Findings consistent with osteomyelitis throughout the middle and distal phalanges of the second toe. Edema and enhancement in the distal 1.7 cm of the proximal phalanx of the second toe are also likely secondary to osteomyelitis.  EKG: 08/13/18 (on chart) Rate 60 bpm Normal sinus rhythm  Left axis deviation  Moderate voltage criteria for LVH, may be normal variant Abnormal ECG When compared with ECG of 11-06-2016  No significant change was found  CV: Echo  09/03/2018 INTERPRETATION NORMAL LEFT VENTRICULAR SYSTOLIC FUNCTION   WITH MILD LVH NORMAL RIGHT VENTRICULAR SYSTOLIC FUNCTION MILD VALVULAR REGURGITATION (See above) NO VALVULAR STENOSIS MILD MR, TR, PR EF >55% Past Medical History:  Diagnosis Date  . Diabetes mellitus without complication ([place])    type 2  . Dysrhythmia    a fib  . Hyperlipidemia   . Hypertension   . Sleep apnea    cpap    Past Surgical History:  Procedure Laterality Date  . APPENDECTOMY    . CARDIAC ELECTROPHYSIOLOGY STUDY AND ABLATION    . cather ablation  2011/2014   cardiac ablation for a fib  . EYE SURGERY Bilateral 05/2014   cataract  . HAMMER TOE SURGERY Right   . HAND SURGERY Bilateral   . LAPAROSCOPIC OOPHERECTOMY    . recession of gums sx    . TUBAL LIGATION      MEDICATIONS: . aspirin 81 MG EC tablet  . Calcium Carbonate-Vitamin D 600-200 MG-UNIT CAPS  . cefUROXime (CEFTIN) 500 MG tablet  . dapagliflozin propanediol (FARXIGA) 5 MG TABS tablet  . Glucosamine HCl-MSM (GLUCOSAMINE-MSM PO)  . hydrochlorothiazide (HYDRODIURIL) 25 MG tablet  . levofloxacin (LEVAQUIN) 500 MG tablet  . lisinopril (PRINIVIL,ZESTRIL) 40 MG tablet  . metoprolol tartrate (LOPRESSOR) 25 MG tablet  . MULTIPLE VITAMIN PO  . nystatin (MYCOSTATIN/NYSTOP)  powder  . Omega-3 Fatty Acids (FISH OIL) 1200 MG CAPS  . omeprazole (PRILOSEC) 20 MG capsule  . simvastatin (ZOCOR) 20 MG tablet   No current facility-administered medications for this encounter.    Maia Plan [facility] Pre-Surgical Testing [phone] 11/08/18 11:55 AM

## 2018-11-08 NOTE — Anesthesia Preprocedure Evaluation (Addendum)
Anesthesia Evaluation  Patient identified by MRN, date of birth, ID band Patient awake    Reviewed: Allergy & Precautions, NPO status , Patient's Chart, lab work & pertinent test results, reviewed documented beta blocker date and time   Airway Mallampati: II  TM Distance: >3 FB Neck ROM: Full    Dental  (+) Dental Advisory Given   Pulmonary sleep apnea ,    Pulmonary exam normal breath sounds clear to auscultation       Cardiovascular hypertension, Pt. on medications and Pt. on home beta blockers Normal cardiovascular exam+ dysrhythmias  Rhythm:Regular Rate:Normal     Neuro/Psych negative neurological ROS  negative psych ROS   GI/Hepatic negative GI ROS, Neg liver ROS,   Endo/Other  diabetes  Renal/GU negative Renal ROS     Musculoskeletal negative musculoskeletal ROS (+)   Abdominal (+) + obese,   Peds  Hematology negative hematology ROS (+)   Anesthesia Other Findings   Reproductive/Obstetrics negative OB ROS                            Anesthesia Physical Anesthesia Plan  ASA: III  Anesthesia Plan: General   Post-op Pain Management:    Induction: Intravenous  PONV Risk Score and Plan: 4 or greater and Ondansetron and Treatment may vary due to age or medical condition  Airway Management Planned: LMA and Oral ETT  Additional Equipment: None  Intra-op Plan:   Post-operative Plan: Extubation in OR  Informed Consent: I have reviewed the patients History and Physical, chart, labs and discussed the procedure including the risks, benefits and alternatives for the proposed anesthesia with the patient or authorized representative who has indicated his/her understanding and acceptance.     Dental advisory given  Plan Discussed with: CRNA  Anesthesia Plan Comments: (See PAT note 11/07/18, Konrad Felix, PA-C)       Anesthesia Quick Evaluation

## 2018-11-11 ENCOUNTER — Encounter (HOSPITAL_COMMUNITY)
Admission: RE | Disposition: A | Discharge status: Home / Self Care | Payer: Self-pay | Source: Home / Self Care | Attending: Orthopedic Surgery

## 2018-11-11 ENCOUNTER — Encounter (HOSPITAL_COMMUNITY): Payer: Self-pay | Admitting: *Deleted

## 2018-11-11 ENCOUNTER — Ambulatory Visit (HOSPITAL_COMMUNITY): Payer: Medicare Other | Admitting: Anesthesiology

## 2018-11-11 ENCOUNTER — Ambulatory Visit (HOSPITAL_COMMUNITY): Payer: Medicare Other | Admitting: Physician Assistant

## 2018-11-11 ENCOUNTER — Ambulatory Visit (HOSPITAL_COMMUNITY)
Admission: RE | Admit: 2018-11-11 | Discharge: 2018-11-11 | Disposition: A | Discharge status: Home / Self Care | Payer: Medicare Other | Attending: Orthopedic Surgery | Admitting: Orthopedic Surgery

## 2018-11-11 DIAGNOSIS — Z9989 Dependence on other enabling machines and devices: Secondary | ICD-10-CM | POA: Diagnosis not present

## 2018-11-11 DIAGNOSIS — Z7982 Long term (current) use of aspirin: Secondary | ICD-10-CM | POA: Insufficient documentation

## 2018-11-11 DIAGNOSIS — M869 Osteomyelitis, unspecified: Secondary | ICD-10-CM | POA: Diagnosis present

## 2018-11-11 DIAGNOSIS — L97519 Non-pressure chronic ulcer of other part of right foot with unspecified severity: Secondary | ICD-10-CM | POA: Insufficient documentation

## 2018-11-11 DIAGNOSIS — Z7984 Long term (current) use of oral hypoglycemic drugs: Secondary | ICD-10-CM | POA: Diagnosis not present

## 2018-11-11 DIAGNOSIS — I10 Essential (primary) hypertension: Secondary | ICD-10-CM | POA: Insufficient documentation

## 2018-11-11 DIAGNOSIS — E1169 Type 2 diabetes mellitus with other specified complication: Secondary | ICD-10-CM | POA: Diagnosis not present

## 2018-11-11 DIAGNOSIS — M86171 Other acute osteomyelitis, right ankle and foot: Secondary | ICD-10-CM | POA: Diagnosis not present

## 2018-11-11 DIAGNOSIS — Z79899 Other long term (current) drug therapy: Secondary | ICD-10-CM | POA: Insufficient documentation

## 2018-11-11 DIAGNOSIS — E785 Hyperlipidemia, unspecified: Secondary | ICD-10-CM | POA: Diagnosis not present

## 2018-11-11 DIAGNOSIS — G4730 Sleep apnea, unspecified: Secondary | ICD-10-CM | POA: Insufficient documentation

## 2018-11-11 DIAGNOSIS — E11621 Type 2 diabetes mellitus with foot ulcer: Secondary | ICD-10-CM | POA: Diagnosis not present

## 2018-11-11 HISTORY — DX: Osteomyelitis, unspecified: M86.9

## 2018-11-11 HISTORY — PX: AMPUTATION TOE: SHX6595

## 2018-11-11 LAB — GLUCOSE, CAPILLARY: Glucose-Capillary: 111 mg/dL — ABNORMAL HIGH (ref 70–99)

## 2018-11-11 SURGERY — AMPUTATION, TOE
Anesthesia: General | Laterality: Right

## 2018-11-11 MED ORDER — BUPIVACAINE HCL (PF) 0.25 % IJ SOLN
INTRAMUSCULAR | Status: AC
  Filled 2018-11-11: qty 30

## 2018-11-11 MED ORDER — LEVOFLOXACIN 500 MG PO TABS: 500.0000 mg | ORAL_TABLET | Freq: Every day | ORAL | 0 refills | Status: DC

## 2018-11-11 MED ORDER — LACTATED RINGERS IV SOLN
INTRAVENOUS | Status: DC
  Administered 2018-11-11: 14:00:00 via INTRAVENOUS

## 2018-11-11 MED ORDER — SODIUM CHLORIDE 0.9 % IV SOLN
INTRAVENOUS | Status: DC | PRN
  Administered 2018-11-11: 500 mL

## 2018-11-11 MED ORDER — SODIUM CHLORIDE 0.9 % IV SOLN
INTRAVENOUS | Status: AC
  Filled 2018-11-11: qty 500000

## 2018-11-11 MED ORDER — LEVOFLOXACIN 500 MG PO TABS: 500.0000 mg | ORAL_TABLET | Freq: Every day | ORAL | 0 refills | Status: AC

## 2018-11-11 MED ORDER — FENTANYL CITRATE (PF) 100 MCG/2ML IJ SOLN
INTRAMUSCULAR | Status: AC
  Filled 2018-11-11: qty 2

## 2018-11-11 MED ORDER — BUPIVACAINE HCL (PF) 0.25 % IJ SOLN
INTRAMUSCULAR | Status: DC | PRN
  Administered 2018-11-11: 20 mL

## 2018-11-11 MED ORDER — PROMETHAZINE HCL 25 MG/ML IJ SOLN: 6.2500 mg | INTRAMUSCULAR | Status: DC | PRN

## 2018-11-11 MED ORDER — PROPOFOL 10 MG/ML IV BOLUS
INTRAVENOUS | Status: DC | PRN
  Administered 2018-11-11: 30 mg via INTRAVENOUS
  Administered 2018-11-11: 120 mg via INTRAVENOUS

## 2018-11-11 MED ORDER — FENTANYL CITRATE (PF) 100 MCG/2ML IJ SOLN: 25.0000 ug | INTRAMUSCULAR | Status: DC | PRN

## 2018-11-11 MED ORDER — ONDANSETRON HCL 4 MG/2ML IJ SOLN
INTRAMUSCULAR | Status: AC
  Filled 2018-11-11: qty 2

## 2018-11-11 MED ORDER — TRANEXAMIC ACID-NACL 1000-0.7 MG/100ML-% IV SOLN
1000.0000 mg | INTRAVENOUS | Status: DC
  Filled 2018-11-11: qty 100

## 2018-11-11 MED ORDER — PROPOFOL 10 MG/ML IV BOLUS
INTRAVENOUS | Status: AC
  Filled 2018-11-11: qty 20

## 2018-11-11 MED ORDER — HYDROCODONE-ACETAMINOPHEN 5-325 MG PO TABS: 1.0000 | ORAL_TABLET | Freq: Four times a day (QID) | ORAL | 0 refills | Status: DC | PRN

## 2018-11-11 MED ORDER — CEFAZOLIN SODIUM-DEXTROSE 2-4 GM/100ML-% IV SOLN
2.0000 g | INTRAVENOUS | Status: AC
  Administered 2018-11-11: 2 g via INTRAVENOUS
  Filled 2018-11-11: qty 100

## 2018-11-11 MED ORDER — CHLORHEXIDINE GLUCONATE 4 % EX LIQD
60.0000 mL | Freq: Once | CUTANEOUS | Status: AC
  Administered 2018-11-11: 4 via TOPICAL

## 2018-11-11 MED ORDER — ONDANSETRON HCL 4 MG/2ML IJ SOLN
INTRAMUSCULAR | Status: DC | PRN
  Administered 2018-11-11: 4 mg via INTRAVENOUS

## 2018-11-11 MED ORDER — LIDOCAINE HCL (CARDIAC) PF 100 MG/5ML IV SOSY
PREFILLED_SYRINGE | INTRAVENOUS | Status: DC | PRN
  Administered 2018-11-11: 70 mg via INTRAVENOUS

## 2018-11-11 MED ORDER — MEPERIDINE HCL 50 MG/ML IJ SOLN: 6.2500 mg | INTRAMUSCULAR | Status: DC | PRN

## 2018-11-11 MED ORDER — FENTANYL CITRATE (PF) 100 MCG/2ML IJ SOLN
INTRAMUSCULAR | Status: DC | PRN
  Administered 2018-11-11 (×2): 50 ug via INTRAVENOUS
  Administered 2018-11-11: 25 ug via INTRAVENOUS

## 2018-11-11 SURGICAL SUPPLY — 35 items
BAG SPEC THK2 15X12 ZIP CLS (MISCELLANEOUS) ×1
BAG ZIPLOCK 12X15 (MISCELLANEOUS) ×3 IMPLANT
BANDAGE ACE 3X5.8 VEL STRL LF (GAUZE/BANDAGES/DRESSINGS) ×2 IMPLANT
BANDAGE ESMARK 6X9 LF (GAUZE/BANDAGES/DRESSINGS) IMPLANT
BLADE SURG SZ10 CARB STEEL (BLADE) ×4 IMPLANT
BNDG CMPR 9X6 STRL LF SNTH (GAUZE/BANDAGES/DRESSINGS) ×1
BNDG ESMARK 6X9 LF (GAUZE/BANDAGES/DRESSINGS) ×3
BNDG GAUZE ELAST 4 BULKY (GAUZE/BANDAGES/DRESSINGS) ×2 IMPLANT
COVER SURGICAL LIGHT HANDLE (MISCELLANEOUS) ×3 IMPLANT
COVER WAND RF STERILE (DRAPES) IMPLANT
CUFF TOURN SGL QUICK 18X4 (TOURNIQUET CUFF) ×3 IMPLANT
DRAPE EXTREMITY T 121X128X90 (DISPOSABLE) ×1 IMPLANT
DRAPE SURG 17X11 SM STRL (DRAPES) ×3 IMPLANT
ELECT REM PT RETURN 15FT ADLT (MISCELLANEOUS) ×3 IMPLANT
GAUZE SPONGE 4X4 12PLY STRL (GAUZE/BANDAGES/DRESSINGS) ×4 IMPLANT
GAUZE XEROFORM 1X8 LF (GAUZE/BANDAGES/DRESSINGS) ×2 IMPLANT
GLOVE BIO SURGEON STRL SZ8 (GLOVE) ×3 IMPLANT
GLOVE ECLIPSE 7.5 STRL STRAW (GLOVE) ×3 IMPLANT
KIT BASIN OR (CUSTOM PROCEDURE TRAY) ×3 IMPLANT
KIT TURNOVER KIT A (KITS) IMPLANT
MANIFOLD NEPTUNE II (INSTRUMENTS) ×3 IMPLANT
NDL SAFETY ECLIPSE 18X1.5 (NEEDLE) IMPLANT
NEEDLE HYPO 18GX1.5 SHARP (NEEDLE) ×3
NS IRRIG 1000ML POUR BTL (IV SOLUTION) ×1 IMPLANT
PACK TOTAL JOINT (CUSTOM PROCEDURE TRAY) ×3 IMPLANT
PAD CAST 4YDX4 CTTN HI CHSV (CAST SUPPLIES) ×1 IMPLANT
PADDING CAST COTTON 4X4 STRL (CAST SUPPLIES)
PROTECTOR NERVE ULNAR (MISCELLANEOUS) ×3 IMPLANT
SOL PREP POV-IOD 4OZ 10% (MISCELLANEOUS) ×1 IMPLANT
SOL PREP PROV IODINE SCRUB 4OZ (MISCELLANEOUS) ×3 IMPLANT
SUT PROLENE 3 0 PS 2 (SUTURE) ×3 IMPLANT
SYRINGE 20CC LL (MISCELLANEOUS) ×2 IMPLANT
TOWEL OR NON WOVEN STRL DISP B (DISPOSABLE) ×3 IMPLANT
TOWEL SURG RFD BLUE STRL DISP (DISPOSABLE) ×3 IMPLANT
WATER STERILE IRR 1000ML POUR (IV SOLUTION) ×1 IMPLANT

## 2018-11-11 NOTE — Transfer of Care (Signed)
Immediate Anesthesia Transfer of Care Note  Patient: Stacie Porter  Procedure(s) Performed: AMPUTATION RIGHT 2ND TOE (Right )  Patient Location: PACU  Anesthesia Type:General  Level of Consciousness: awake, alert  and oriented  Airway & Oxygen Therapy: Patient Spontanous Breathing and Patient connected to face mask oxygen  Post-op Assessment: Report given to RN and Post -op Vital signs reviewed and stable  Post vital signs: Reviewed and stable  Last Vitals:  Vitals Value Taken Time  BP 133/62 11/11/2018  4:06 PM  Temp    Pulse 66 11/11/2018  4:07 PM  Resp 23 11/11/2018  4:07 PM  SpO2 100 % 11/11/2018  4:07 PM  Vitals shown include unvalidated device data.  Last Pain:  Vitals:   11/11/18 1409  TempSrc:   PainSc: 0-No pain         Complications: No apparent anesthesia complications

## 2018-11-11 NOTE — Progress Notes (Signed)
Patient informed that surgery is delayed by 30 -45 minutes. She verbalizes understanding.

## 2018-11-11 NOTE — H&P (Signed)
A pre op hand p   Chief Complaint: Right second toe infection  HPI: Stacie Porter is a 73 y.o. female who presents for evaluation of right second toe infection. It has been present for greater than 3 weeks and has been worsening. She has failed conservative measures. Pain is rated as moderate.  Past Medical History:  Diagnosis Date  . Diabetes mellitus without complication ([place])    type 2  . Dysrhythmia    a fib  . Hyperlipidemia   . Hypertension   . Sleep apnea    cpap   Past Surgical History:  Procedure Laterality Date  . APPENDECTOMY    . CARDIAC ELECTROPHYSIOLOGY STUDY AND ABLATION    . cather ablation  2011/2014   cardiac ablation for a fib  . EYE SURGERY Bilateral 05/2014   cataract  . HAMMER TOE SURGERY Right   . HAND SURGERY Bilateral   . LAPAROSCOPIC OOPHERECTOMY    . recession of gums sx    . TUBAL LIGATION     Social History   Socioeconomic History  . Marital status: Married    Spouse name: Not on file  . Number of children: 3  . Years of education: Not on file  . Highest education level: Some college, no degree  Occupational History  . Occupation: retired    Comment: part time subsitute   Social Needs  . Financial resource strain: Not hard at all  . Food insecurity:    Worry: Never true    Inability: Never true  . Transportation needs:    Medical: No    Non-medical: No  Tobacco Use  . Smoking status: Never Smoker  . Smokeless tobacco: Never Used  Substance and Sexual Activity  . Alcohol use: No  . Drug use: No  . Sexual activity: Yes  Lifestyle  . Physical activity:    Days per week: 0 days    Minutes per session: 0 min  . Stress: Only a little  Relationships  . Social connections:    Talks on phone: Patient refused    Gets together: Patient refused    Attends religious service: Patient refused    Active member of club or organization: Patient refused    Attends meetings of clubs or organizations: Patient refused    Relationship  status: Patient refused  Other Topics Concern  . Not on file  Social History Narrative  . Not on file   Family History  Problem Relation Age of Onset  . Ovarian cancer Sister   . Cancer Sister        ovarian  . Lung cancer Sister   . Cancer Sister        lung and thyroid  . Thyroid cancer Sister   . Heart disease Mother   . Heart disease Father   . Hypertension Father   . Diabetes Maternal Uncle   . Diabetes Maternal Uncle   . Breast cancer Neg Hx    Allergies  Allergen Reactions  . Ibuprofen     Elevates BP if taken continuously  . Meloxicam Swelling  . Multaq [Dronedarone]     Unknown reaction    Prior to Admission medications   Medication Sig Start Date End Date Taking? Authorizing Provider  aspirin 81 MG EC tablet TAKE 1 TABLET BY MOUTH EVERY DAY Patient taking differently: Take 81 mg by mouth at bedtime.  09/13/18  Yes Mar Daring, PA-C  Calcium Carbonate-Vitamin D 600-200 MG-UNIT CAPS Take 1 tablet by mouth  daily. 01/18/07  Yes [provider]  cefUROXime (CEFTIN) 500 MG tablet Take 500 mg by mouth every evening.  09/05/18  Yes [provider]  dapagliflozin propanediol (FARXIGA) 5 MG TABS tablet Take 5 mg by mouth daily. 10/07/18  Yes Fenton Malling M, PA-C  Glucosamine HCl-MSM (GLUCOSAMINE-MSM PO) Take 1 tablet by mouth at bedtime.   Yes [provider]  hydrochlorothiazide (HYDRODIURIL) 25 MG tablet Take 25 mg by mouth daily.  08/16/17 11/06/18 Yes [provider]  levofloxacin (LEVAQUIN) 500 MG tablet Take 500 mg by mouth daily. Started: 11/02/2018 Will finish: 11/16/2018   Yes [provider]  lisinopril (PRINIVIL,ZESTRIL) 40 MG tablet Take 40 mg by mouth daily.  07/30/17 11/06/18 Yes [provider]  metoprolol tartrate (LOPRESSOR) 25 MG tablet Take 25 mg by mouth 2 (two) times daily.    Yes [provider]  MULTIPLE VITAMIN PO Take 1 tablet by mouth daily. 01/18/07  Yes [provider]   nystatin (MYCOSTATIN/NYSTOP) powder Apply 1 g topically every other day. Apply topically to the space between the big toe and second toe with dressing changes 10/10/18  Yes [provider]  Omega-3 Fatty Acids (FISH OIL) 1200 MG CAPS Take 1,200 mg by mouth daily.   Yes [provider]  omeprazole (PRILOSEC) 20 MG capsule TAKE 1 CAPSULES BY MOUTH EVERY DAY Patient taking differently: Take 20 mg by mouth daily.  08/07/18  Yes Fenton Malling M, PA-C  simvastatin (ZOCOR) 20 MG tablet TAKE 1 TABLET BY MOUTH DAILY Patient taking differently: Take 20 mg by mouth every evening.  05/27/18  Yes Mar Daring, PA-C     Positive ROS: None  All other systems have been reviewed and were otherwise negative with the exception of those mentioned in the HPI and as above.  Physical Exam: Vitals:   11/11/18 1335  BP: (!) 162/62  Pulse: 71  Resp: 16  Temp: 98.1 F (36.7 C)  SpO2: 100%    General: Alert, no acute distress Cardiovascular: No pedal edema Respiratory: No cyanosis, no use of accessory musculature GI: No organomegaly, abdomen is soft and non-tender Skin: No lesions in the area of chief complaint Neurologic: Sensation intact distally Psychiatric: Patient is competent for consent with normal mood and affect Lymphatic: No axillary or cervical lymphadenopathy  MUSCULOSKELETAL: Second toe: Small ulceration on the medial aspect.  Significant erythema.  Significant soft tissue swelling.  Significant induration.  X-ray: MRI shows osteomyelitis of the second toe with the medial draining sinus  Assessment/Plan: RIGHT 2ND TOE OSTEOMYELITIS WITH MEDIAL DRAINING WOUND Plan for Procedure(s): AMPUTATION RIGHT 2ND TOE  The risks benefits and alternatives were discussed with the patient including but not limited to the risks of nonoperative treatment, versus surgical intervention including infection, bleeding, nerve injury, malunion, nonunion, hardware prominence, hardware  failure, need for hardware removal, blood clots, cardiopulmonary complications, morbidity, mortality, among others, and they were willing to proceed.  Predicted outcome is good, although there will be at least a six to nine month expected recovery.  Alta Corning, MD 11/11/2018 1:43 PM

## 2018-11-11 NOTE — Anesthesia Postprocedure Evaluation (Signed)
Anesthesia Post Note  Patient: Stacie Porter  Procedure(s) Performed: AMPUTATION RIGHT 2ND TOE (Right )     Patient location during evaluation: PACU Anesthesia Type: General Level of consciousness: sedated and patient cooperative Pain management: pain level controlled Vital Signs Assessment: post-procedure vital signs reviewed and stable Respiratory status: spontaneous breathing Cardiovascular status: stable Anesthetic complications: no    Last Vitals:  Vitals:   11/11/18 1640 11/11/18 1643  BP: (!) 136/59 (!) 141/60  Pulse: 61 65  Resp: 18 18  Temp: 36.5 C 36.5 C  SpO2: 95% 96%    Last Pain:  Vitals:   11/11/18 1643  TempSrc:   PainSc: 0-No pain                 Nolon Nations

## 2018-11-11 NOTE — Op Note (Signed)
[name], Stacie Porter MEDICAL RECORD [id number] ACCOUNT [id number] DATE OF BIRTH:08-10-1946 FACILITY: [facility] LOCATION: [facility] PHYSICIAN:Peggyann Zwiefelhofer Maudie Mercury, MD  OPERATIVE REPORT  DATE OF PROCEDURE:  11/11/2018  PREOPERATIVE DIAGNOSIS:  Infected right second toe.  POSTOPERATIVE DIAGNOSIS:  Infected right second toe.  PROCEDURE:  Right second toe amputation at metatarsophalangeal joint.  SURGEON:  Dorna Leitz, MD  ASSISTANT:  Gaspar Skeeters PA-C, was present for the entire case and assisted by retraction and closing to minimize OR time.  BRIEF HISTORY:  The patient is a 73 year old female with a history of having had previous bunion surgery.  She had done okay for a while, but ultimately the second toe, which had also been operated on  began to have a cock-up deformity.  The great toe  continued to have a worsening bunion and push the second toe.  She developed an open wound in that area.  Ultimately became infected.  An MRI confirmed this.  I met her for the first time at this point and with an infected second toe, we talked about  treatment options and felt that she would ultimately need an amputation of the 2nd toe.  She was brought to the operating of this procedure.  DESCRIPTION OF PROCEDURE:  The patient brought to the operating room after adequate anesthesia was obtained with general anesthetic, the patient brought to the operating table.  Right foot was prepped and draped in usual sterile fashion.  Following this,  the leg was exsanguinated, blood pressure tourniquet inflated to 250 mmHg.  Following this, curved incision was made first and second toe amputation and subcutaneous taken to the level of the proximal phalanx.  It was dissected back to the  metatarsophalangeal joint.  The toe was amputated and sent to path.  The wound was irrigated.  The tourniquet was let down, all bleeders controlled with electrocautery.  It was closed with interrupted Prolene sutures.  Sterile compressive  dressing was  applied and the patient was taken to recovery was noted to be in satisfactory condition.    Estimated blood loss for the procedure is minimal.    Of note Gaspar Skeeters was present for the entire case and assisted by manipulation, retraction, and closing to minimize OR time.  AN/NUANCE  D:11/11/2018 T:11/11/2018 JOB:[id number]

## 2018-11-11 NOTE — Anesthesia Procedure Notes (Signed)
Procedure Name: LMA Insertion Date/Time: 11/11/2018 3:23 PM Performed by: Glory Buff, CRNA Pre-anesthesia Checklist: Patient identified, Emergency Drugs available, Suction available and Patient being monitored Patient Re-evaluated:Patient Re-evaluated prior to induction Oxygen Delivery Method: Circle system utilized Preoxygenation: Pre-oxygenation with 100% oxygen Induction Type: IV induction Ventilation: Mask ventilation without difficulty LMA: LMA inserted LMA Size: 4.0 Number of attempts: 1 Placement Confirmation: positive ETCO2 Tube secured with: Tape Dental Injury: Teeth and Oropharynx as per pre-operative assessment

## 2018-11-11 NOTE — Brief Op Note (Signed)
11/11/2018  3:55 PM  PATIENT:  Stacie Porter  73 y.o. female  PRE-OPERATIVE DIAGNOSIS:  RIGHT 2ND TOE OSTEOMYELITIS WITH MEDIAL DRAINING WOUND  POST-OPERATIVE DIAGNOSIS:  RIGHT 2ND TOE OSTEOMYELITIS WITH MEDIAL DRAINING WOUND  PROCEDURE:  Procedure(s): AMPUTATION RIGHT 2ND TOE (Right)  SURGEON:  Surgeon(s) and Role:    Dorna Leitz, MD - Primary  PHYSICIAN ASSISTANT:   ASSISTANTS: jim bethune   ANESTHESIA:   general  EBL:  minimal   BLOOD ADMINISTERED:none  DRAINS: none   LOCAL MEDICATIONS USED:  MARCAINE     SPECIMEN:  Source of Specimen:  r 2nd toe  DISPOSITION OF SPECIMEN:  PATHOLOGY  COUNTS:  YES  TOURNIQUET:   Total Tourniquet Time Documented: Calf (Right) - 3 minutes Total: Calf (Right) - 3 minutes   DICTATION: .Other Dictation: Dictation Number [id number]  PLAN OF CARE: Discharge to home after PACU  PATIENT DISPOSITION:  PACU - hemodynamically stable.   Delay start of Pharmacological VTE agent (>24hrs) due to surgical blood loss or risk of bleeding: no

## 2018-11-12 LAB — GLUCOSE, CAPILLARY: GLUCOSE-CAPILLARY: 113 mg/dL — AB (ref 70–99)

## 2018-11-13 ENCOUNTER — Encounter (HOSPITAL_COMMUNITY): Payer: Self-pay | Admitting: Orthopedic Surgery

## 2018-12-03 ENCOUNTER — Encounter: Payer: Medicare Other | Attending: Physician Assistant | Admitting: Physician Assistant

## 2018-12-03 ENCOUNTER — Other Ambulatory Visit: Payer: Self-pay

## 2018-12-03 DIAGNOSIS — I482 Chronic atrial fibrillation, unspecified: Secondary | ICD-10-CM | POA: Insufficient documentation

## 2018-12-03 DIAGNOSIS — M86671 Other chronic osteomyelitis, right ankle and foot: Secondary | ICD-10-CM | POA: Diagnosis not present

## 2018-12-03 DIAGNOSIS — Z89421 Acquired absence of other right toe(s): Secondary | ICD-10-CM | POA: Diagnosis not present

## 2018-12-03 DIAGNOSIS — L97512 Non-pressure chronic ulcer of other part of right foot with fat layer exposed: Secondary | ICD-10-CM | POA: Insufficient documentation

## 2018-12-03 DIAGNOSIS — E1169 Type 2 diabetes mellitus with other specified complication: Secondary | ICD-10-CM | POA: Insufficient documentation

## 2018-12-03 DIAGNOSIS — I10 Essential (primary) hypertension: Secondary | ICD-10-CM | POA: Diagnosis not present

## 2018-12-03 DIAGNOSIS — E11621 Type 2 diabetes mellitus with foot ulcer: Secondary | ICD-10-CM | POA: Diagnosis not present

## 2018-12-03 NOTE — Progress Notes (Signed)
ARRIA, NAIM ([id number]) Visit Report for 12/03/2018 Chief Complaint Document Details Patient Name: Stacie Porter, Stacie Porter Date of Service: 12/03/2018 9:15 AM Medical Record Number: [id number] Patient Account Number: [id number] Date of Birth/Sex: 07/18/1946 (73 y.o. F) Treating RN: Montey Hora Primary Care Provider: Fenton Malling Other Clinician: Referring Provider: Fenton Malling Treating Provider/Extender: Melburn Hake, HOYT Weeks in Treatment: 7 Information Obtained from: Patient Chief Complaint Right 2nd Toe Amputation Electronic Signature(s) Signed: 12/03/2018 5:10:39 PM By: Worthy Keeler PA-C Entered By: Worthy Keeler on 12/03/2018 10:21:13 Glatfelter, Keitha Viona Gilmore ([id number]) -------------------------------------------------------------------------------- HPI Details Patient Name: Stacie Porter Date of Service: 12/03/2018 9:15 AM Medical Record Number: [id number] Patient Account Number: [id number] Date of Birth/Sex: 1946/05/09 (73 y.o. F) Treating RN: Montey Hora Primary Care Provider: Fenton Malling Other Clinician: Referring Provider: Fenton Malling Treating Provider/Extender: Melburn Hake, HOYT Weeks in Treatment: 7 History of Present Illness HPI Description: 10/10/18 patient presents today for initial evaluation our clinic concerning an ulcer that she has on her right second toe on the medial/plantar aspect. This has been present for a couple of months according to the patient. She has been seeing Dr. Vickki Muff who has recommended amputation. The patient however really was not interested in proceeding with an application surgery. For that reason she decided to potentially get a second opinion for the wound center but had wanted to speak with her primary care provider first. She subsequently had her appointment with her PCP and as a result of that conversation it was determined that she would come to the wound center for a second opinion to see if we agreed with the assessment  of having pain if you take or not. The patient is really not having any significant pain although she does state that there is some discomfort. No fevers, chills, nausea, or vomiting noted at this time. She has a history of hypertension, diabetes mellitus type II, and atrial fibrillation for which she has undergone an ablation. She seems to have a desire to want to salvage the toe if it all possible there is a deformity of this region where the second toe overlaps the first which has caused the pressure and subsequently the wound that we are currently seeing. I did review patient's x-ray as performed by Dr. Vickki Muff which resulted in some question of osseous destruction of the second toe. Nonetheless this was not definitive. 10/17/18 on evaluation today patient's toe actually appears to be doing better in my pinion. She seems to show some signs of epithelialization around the edge of the toe I do believe that she is much less callous although there was a little bit of dry skin and dressing surrounding the wound opening. I think that is just a matter of not putting as much of the Prisma all around but just put it more in the central portion of the wound that will help prevent that type of thing from occurring. In general the maceration between the toes appears to be greatly improved. Overall very pleased in this regard. Her MRI was scheduled originally for Saturday which is two days away she changed this to next Tuesday. She will have this done however before I see her back for follow-up next Thursday. 10/24/18 on evaluation today patient presents for follow-up concerning her right second toe ulcer. On evaluation today patient's wound bed actually showed signs of continued exposure of the tenant although there does not appear to be any drying out a breakdown of the tenant at this time which is good news.  I did review her MRI which was obtained two days ago which does show that she has osteomyelitis of the  entire toe fortunately this does not seem to proceed into the foot proper. The good news is she's not having any significant pain which is excellent. The bad news is obviously there is infection she is going to need to continue antibiotics and at minimum we probably need to go ahead and see about making a referral to infectious disease. She also has to make up her mind as far as whether or not she would like to see a surgeon for the possibility of amputation which is another option especially considering the deformity of the second toe which wraps onto the dorsal surface of the great toe which is what appears to have caused the callous and subsequently this wound to begin with. Fortunately there is no evidence of systemic infection at this point. She does seem to be tolerating the doxycycline without complication. 10/29/18 on evaluation today patient appears to be doing about the same in regard to her toe ulcer. It is not appear to be any evidence of infection at this time which is good news. She's been tolerating the dressing changes without complication. With that being said we are not really seeing any significant improvement this time as far as the overall measurements are concerned. I did have a conversation with her quite extensively she would like to see Dr. Berenice Primas, orthopedic surgery, to discuss further options from the standpoint of amputation. She does believe this is the direction she would prefer to go especially in light of the foot deformity with the second toe overlapping the first as it is. 11/05/18 on evaluation today patient presents for follow-up concerning her ongoing issues with her right second toe. She did see Dr. Berenice Primas yesterday and he is planning for reputation. He actually suggested tomorrow although she was not able to do tomorrow. 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 Otherwise  she thinks this is definitely gonna be the best plan for her at this time. I tend to agree especially in light of the fact that her toes seems to be rotating and now sticking almost straight up which I think may even be a pathologic fracture. Laker, BRIGIDA SCOTTI. ([id number]) 12/03/18 on evaluation today patient appears to be doing well actually in regard to her amputation site of the right base of the second toe. We where she previously had osteomyelitis of the right second toe. We were caring for her during that time. Nonetheless she ended up seeing Dr. Berenice Primas due to a worsening condition and had an amputation at the site. She has regarding this issue and has been doing very well. It has been three weeks since her surgery. Nonetheless she was supposed to have her sutures removed on Monday, April 6. Nonetheless on Friday, April 3 she was contacted by the orthopedic office due to the Covid-19 Virus being suspected in their office they have shut down for the next one-two weeks. For that reason she wasn't sure how she was going to get the sutures removed. Nonetheless after talking to Dr. Berenice Primas he actually called me Monday morning April 6 to provide permission for me removing the sutures for her. Since we have been involved in healthcare I consented to doing such without any concerns. He therefore gave permission for [place] to proceed with treatment in that regard and  we did see her for evaluation today for suture removal. Everything appears to be doing well at this point as far as the overall appearance of the wound bed is concerned she's had no bleeding or complications. Overall she states that she said no discomfort throughout the process. Electronic Signature(s) Signed: 12/03/2018 5:10:39 PM By: Worthy Keeler PA-C Entered By: Worthy Keeler on 12/03/2018 10:23:01 Geisinger, Mehar Viona Gilmore ([id number]) -------------------------------------------------------------------------------- Physical Exam Details Patient  Name: Stacie Porter Date of Service: 12/03/2018 9:15 AM Medical Record Number: [id number] Patient Account Number: [id number] Date of Birth/Sex: [birth year] (73 y.o. F) Treating RN: Montey Hora Primary Care Provider: Fenton Malling Other Clinician: Referring Provider: Fenton Malling Treating Provider/Extender: Melburn Hake, HOYT Weeks in Treatment: 7 Constitutional Well-nourished and well-hydrated in no acute distress. Respiratory normal breathing without difficulty. clear to auscultation bilaterally. Cardiovascular regular rate and rhythm with normal S1, S2. Psychiatric this patient is able to make decisions and demonstrates good insight into disease process. Alert and Oriented x 3. pleasant and cooperative. Notes Upon evaluation today patient's surgical site shows evidence of five sutures in place. Everything appears to be drying there's no signs of active infection at this time which is good news. Therefore was able to remove all five of the sutures with only minimal bleeding on the lateral suture side on the outside of the surgical incision. This did not appear to open up inside and there was no evidence of any further issues at this point which is good news. Overall very pleased with how things appear to be doing and I think the patient has healed quite nicely in my opinion. Electronic Signature(s) Signed: 12/03/2018 5:10:39 PM By: Worthy Keeler PA-C Entered By: Worthy Keeler on 12/03/2018 10:18:09 Kelch, Ashleen Viona Gilmore ([id number]) -------------------------------------------------------------------------------- Physician Orders Details Patient Name: Stacie Porter Date of Service: 12/03/2018 9:15 AM Medical Record Number: [id number] Patient Account Number: [id number] Date of Birth/Sex: 1945/09/05 (73 y.o. F) Treating RN: Montey Hora Primary Care Provider: Fenton Malling Other Clinician: Referring Provider: Fenton Malling Treating Provider/Extender: Melburn Hake,  HOYT Weeks in Treatment: 7 Verbal / Phone Orders: No Diagnosis Coding ICD-10 Coding Code Description E11.621 Type 2 diabetes mellitus with foot ulcer L97.512 Non-pressure chronic ulcer of other part of right foot with fat layer exposed M86.671 Other chronic osteomyelitis, right ankle and foot I48.20 Chronic atrial fibrillation, unspecified Discharge From [facility] Services o Discharge from [place] - Antibiotic ointment and gauze covering and continue using your toe separator Electronic Signature(s) Signed: 12/03/2018 2:58:29 PM By: Montey Hora Signed: 12/03/2018 5:10:39 PM By: Worthy Keeler PA-C Entered By: Montey Hora on 12/03/2018 09:45:13 Everard, Alyzae W. ([id number]) -------------------------------------------------------------------------------- Problem List Details Patient Name: Stacie Porter Date of Service: 12/03/2018 9:15 AM Medical Record Number: [id number] Patient Account Number: [id number] Date of Birth/Sex: [birth year] (73 y.o. F) Treating RN: Montey Hora Primary Care Provider: Fenton Malling Other Clinician: Referring Provider: Fenton Malling Treating Provider/Extender: Melburn Hake, HOYT Weeks in Treatment: 7 Active Problems ICD-10 Evaluated Encounter Code Description Active Date Today Diagnosis E11.621 Type 2 diabetes mellitus with foot ulcer 10/10/2018 No Yes Z89.421 Acquired absence of other right toe(s) 12/03/2018 No Yes I48.20 Chronic atrial fibrillation, unspecified 10/10/2018 No Yes Inactive Problems Resolved Problems Electronic Signature(s) Signed: 12/03/2018 5:10:39 PM By: Worthy Keeler PA-C Entered By: Worthy Keeler on 12/03/2018 10:20:54 Scronce, Thelma Viona Gilmore ([id number]) -------------------------------------------------------------------------------- Progress Note Details Patient Name: Stacie Porter Date of Service: 12/03/2018 9:15 AM Medical Record Number: [id number] Patient Account Number: [id number] Date of  Birth/Sex: 08-14-1946 (73 y.o.  F) Treating RN: Montey Hora Primary Care Provider: Fenton Malling Other Clinician: Referring Provider: Fenton Malling Treating Provider/Extender: Melburn Hake, HOYT Weeks in Treatment: 7 Subjective Chief Complaint Information obtained from Patient Right 2nd Toe Amputation History of Present Illness (HPI) 10/10/18 patient presents today for initial evaluation our clinic concerning an ulcer that she has on her right second toe on the medial/plantar aspect. This has been present for a couple of months according to the patient. She has been seeing Dr. Vickki Muff who has recommended amputation. The patient however really was not interested in proceeding with an application surgery. For that reason she decided to potentially get a second opinion for the wound center but had wanted to speak with her primary care provider first. She subsequently had her appointment with her PCP and as a result of that conversation it was determined that she would come to the wound center for a second opinion to see if we agreed with the assessment of having pain if you take or not. The patient is really not having any significant pain although she does state that there is some discomfort. No fevers, chills, nausea, or vomiting noted at this time. She has a history of hypertension, diabetes mellitus type II, and atrial fibrillation for which she has undergone an ablation. She seems to have a desire to want to salvage the toe if it all possible there is a deformity of this region where the second toe overlaps the first which has caused the pressure and subsequently the wound that we are currently seeing. I did review patient's x-ray as performed by Dr. Vickki Muff which resulted in some question of osseous destruction of the second toe. Nonetheless this was not definitive. 10/17/18 on evaluation today patient's toe actually appears to be doing better in my pinion. She seems to show some signs of epithelialization around  the edge of the toe I do believe that she is much less callous although there was a little bit of dry skin and dressing surrounding the wound opening. I think that is just a matter of not putting as much of the Prisma all around but just put it more in the central portion of the wound that will help prevent that type of thing from occurring. In general the maceration between the toes appears to be greatly improved. Overall very pleased in this regard. Her MRI was scheduled originally for Saturday which is two days away she changed this to next Tuesday. She will have this done however before I see her back for follow-up next Thursday. 10/24/18 on evaluation today patient presents for follow-up concerning her right second toe ulcer. On evaluation today patient's wound bed actually showed signs of continued exposure of the tenant although there does not appear to be any drying out a breakdown of the tenant at this time which is good news. I did review her MRI which was obtained two days ago which does show that she has osteomyelitis of the entire toe fortunately this does not seem to proceed into the foot proper. The good news is she's not having any significant pain which is excellent. The bad news is obviously there is infection she is going to need to continue antibiotics and at minimum we probably need to go ahead and see about making a referral to infectious disease. She also has to make up her mind as far as whether or not she would like to see a surgeon for the possibility of amputation which is  another option especially considering the deformity of the second toe which wraps onto the dorsal surface of the great toe which is what appears to have caused the callous and subsequently this wound to begin with. Fortunately there is no evidence of systemic infection at this point. She does seem to be tolerating the doxycycline without complication. 10/29/18 on evaluation today patient appears to be doing  about the same in regard to her toe ulcer. It is not appear to be any evidence of infection at this time which is good news. She's been tolerating the dressing changes without complication. With that being said we are not really seeing any significant improvement this time as far as the overall measurements are concerned. I did have a conversation with her quite extensively she would like to see Dr. Berenice Primas, orthopedic surgery, to discuss further options from the standpoint of amputation. She does believe this is the direction she would prefer to go especially in light of the foot deformity with the second toe overlapping the first as it is. 11/05/18 on evaluation today patient presents for follow-up concerning her ongoing issues with her right second toe. She did Gilder, [id number]. ([id number]) see Dr. Berenice Primas yesterday and he is planning for reputation. He actually suggested tomorrow although she was not able to do tomorrow. 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 Otherwise she thinks this is definitely gonna be the best plan for her at this time. I tend to agree especially in light of the fact that her toes seems to be rotating and now sticking almost straight up which I think may even be a pathologic fracture. 12/03/18 on evaluation today patient appears to be doing well actually in regard to her amputation site of the right base of the second toe. We where she previously had osteomyelitis of the right second toe. We were caring for her during that time. Nonetheless she ended up seeing Dr. Berenice Primas due to a worsening condition and had an amputation at the site. She has regarding this issue and has been doing very well. It has been three weeks since her surgery. Nonetheless she was supposed to have her sutures removed on Monday, April 6. Nonetheless on Friday, April 3 she was contacted by the orthopedic office due  to the Covid-19 Virus being suspected in their office they have shut down for the next one-two weeks. For that reason she wasn't sure how she was going to get the sutures removed. Nonetheless after talking to Dr. Berenice Primas he actually called me Monday morning April 6 to provide permission for me removing the sutures for her. Since we have been involved in healthcare I consented to doing such without any concerns. He therefore gave permission for [place] to proceed with treatment in that regard and we did see her for evaluation today for suture removal. Everything appears to be doing well at this point as far as the overall appearance of the wound bed is concerned she's had no bleeding or complications. Overall she states that she said no discomfort throughout the process. Patient History Information obtained from Patient. Family History Cancer - Siblings, Diabetes - Mother, Heart Disease - Mother,Father, Hypertension - Mother,Father, Lung Disease - Siblings, Stroke - Mother, No family history of Hereditary Spherocytosis, Kidney Disease, Seizures, Thyroid Problems, Tuberculosis. Social History Never smoker, Marital Status - Married, Alcohol Use - Never, Drug Use - No History, Caffeine Use -  Never. Medical History Eyes Patient has history of Cataracts - removed Denies history of Glaucoma, Optic Neuritis Ear/Nose/Mouth/Throat Denies history of Chronic sinus problems/congestion, Middle ear problems Hematologic/Lymphatic Denies history of Anemia, Hemophilia, Human Immunodeficiency Virus, Lymphedema, Sickle Cell Disease Respiratory Denies history of Aspiration, Asthma, Chronic Obstructive Pulmonary Disease (COPD), Pneumothorax, Sleep Apnea, Tuberculosis Cardiovascular Patient has history of Arrhythmia - hx a fib, Hypertension Denies history of Angina, Congestive Heart Failure, Coronary Artery Disease, Deep Vein Thrombosis, Hypotension, Myocardial Infarction, Peripheral Arterial Disease, Peripheral  Venous Disease, Phlebitis, Vasculitis Gastrointestinal Denies history of Cirrhosis , Colitis, Crohn s, Hepatitis A, Hepatitis B, Hepatitis C Endocrine Patient has history of Type II Diabetes Denies history of Type I Diabetes Genitourinary Denies history of End Stage Renal Disease Immunological Denies history of Lupus Erythematosus, Raynaud s, Scleroderma Integumentary (Skin) Denies history of History of Burn, History of pressure wounds Musculoskeletal Denies history of Gout, Rheumatoid Arthritis, Osteoarthritis, Osteomyelitis Neurologic Shoultz, Daneille W. ([id number]) Patient has history of Neuropathy Denies history of Dementia, Quadriplegia, Paraplegia, Seizure Disorder Oncologic Denies history of Received Chemotherapy, Received Radiation Psychiatric Denies history of Anorexia/bulimia, Confinement Anxiety Review of Systems (ROS) Constitutional Symptoms (General Health) Denies complaints or symptoms of Fatigue, Fever, Chills, Marked Weight Change. Respiratory Denies complaints or symptoms of Chronic or frequent coughs, Shortness of Breath. Cardiovascular Denies complaints or symptoms of Chest pain, LE edema. Objective Constitutional Well-nourished and well-hydrated in no acute distress. Vitals Time Taken: 9:19 AM, Height: 65 in, Weight: 226.3 lbs, BMI: 37.7, Temperature: 98.5 F, Pulse: 54 bpm, Respiratory Rate: 16 breaths/min, Blood Pressure: 131/60 mmHg. Respiratory normal breathing without difficulty. clear to auscultation bilaterally. Cardiovascular regular rate and rhythm with normal S1, S2. Psychiatric this patient is able to make decisions and demonstrates good insight into disease process. Alert and Oriented x 3. pleasant and cooperative. General Notes: Upon evaluation today patient's surgical site shows evidence of five sutures in place. Everything appears to be drying there's no signs of active infection at this time which is good news. Therefore was able to remove  all five of the sutures with only minimal bleeding on the lateral suture side on the outside of the surgical incision. This did not appear to open up inside and there was no evidence of any further issues at this point which is good news. Overall very pleased with how things appear to be doing and I think the patient has healed quite nicely in my opinion. Other Condition(s) Patient presents with Scar / Keloid located on the Right Foot. The skin appearance exhibited: Dry/Scaly, Scarring. General Notes: Patient with healed amputation site of her right second toe, 5 intact sutures were removed today Assessment [place], Qiana W. ([id number]) Active Problems ICD-10 Type 2 diabetes mellitus with foot ulcer Acquired absence of other right toe(s) Chronic atrial fibrillation, unspecified Plan Discharge From [facility] Services: Discharge from [place] - Antibiotic ointment and gauze covering and continue using your toe separator My suggestion at this time is gonna be that we go ahead and discontinue any additional wound care services although she had a little bit of bleeding this was mainly a superficial irritation from removal of the one suture which was somewhat more embedded on the lateral aspect of the surgical incision site. Otherwise there's no signs of infection or dehiscence. Everything seems to be doing excellent. I will send a copy of the note to Dr. Berenice Primas as well for his records but as far as I'm concerned I think this patient is well on her way to being  healed without any further complication. A little bit of antibiotic ointment and a Band-Aid was applied over the area today where she had a slight amount of bleeding but again this was just more irritation from actually having to remove the suture. If she has any complications are concerned she definitely can let us know. Electronic Signature(s) Signed: 12/03/2018 5:10:39 PM By: Worthy Keeler PA-C Entered By: Worthy Keeler on 12/03/2018  10:23:20 Luu, Krizia Viona Gilmore ([id number]) -------------------------------------------------------------------------------- ROS/PFSH Details Patient Name: Stacie Porter Date of Service: 12/03/2018 9:15 AM Medical Record Number: [id number] Patient Account Number: [id number] Date of Birth/Sex: [birth year] (73 y.o. F) Treating RN: Montey Hora Primary Care Provider: Fenton Malling Other Clinician: Referring Provider: Fenton Malling Treating Provider/Extender: Melburn Hake, HOYT Weeks in Treatment: 7 Information Obtained From Patient Constitutional Symptoms (General Health) Complaints and Symptoms: Negative for: Fatigue; Fever; Chills; Marked Weight Change Respiratory Complaints and Symptoms: Negative for: Chronic or frequent coughs; Shortness of Breath Medical History: Negative for: Aspiration; Asthma; Chronic Obstructive Pulmonary Disease (COPD); Pneumothorax; Sleep Apnea; Tuberculosis Cardiovascular Complaints and Symptoms: Negative for: Chest pain; LE edema Medical History: Positive for: Arrhythmia - hx a fib; Hypertension Negative for: Angina; Congestive Heart Failure; Coronary Artery Disease; Deep Vein Thrombosis; Hypotension; Myocardial Infarction; Peripheral Arterial Disease; Peripheral Venous Disease; Phlebitis; Vasculitis Eyes Medical History: Positive for: Cataracts - removed Negative for: Glaucoma; Optic Neuritis Ear/Nose/Mouth/Throat Medical History: Negative for: Chronic sinus problems/congestion; Middle ear problems Hematologic/Lymphatic Medical History: Negative for: Anemia; Hemophilia; Human Immunodeficiency Virus; Lymphedema; Sickle Cell Disease Gastrointestinal Medical History: Negative for: Cirrhosis ; Colitis; Crohnos; Hepatitis A; Hepatitis B; Hepatitis C Endocrine Peasley, Lanaya W. ([id number]) Medical History: Positive for: Type II Diabetes Negative for: Type I Diabetes Treated with: Oral agents Genitourinary Medical History: Negative for: End Stage  Renal Disease Immunological Medical History: Negative for: Lupus Erythematosus; Raynaudos; Scleroderma Integumentary (Skin) Medical History: Negative for: History of Burn; History of pressure wounds Musculoskeletal Medical History: Negative for: Gout; Rheumatoid Arthritis; Osteoarthritis; Osteomyelitis Neurologic Medical History: Positive for: Neuropathy Negative for: Dementia; Quadriplegia; Paraplegia; Seizure Disorder Oncologic Medical History: Negative for: Received Chemotherapy; Received Radiation Psychiatric Medical History: Negative for: Anorexia/bulimia; Confinement Anxiety HBO Extended History Items Eyes: Cataracts Immunizations Pneumococcal Vaccine: Received Pneumococcal Vaccination: Yes Immunization Notes: up to date Implantable Devices No devices added Family and Social History Cancer: Yes - Siblings; Diabetes: Yes - Mother; Heart Disease: Yes - Mother,Father; Hereditary Spherocytosis: No; Hypertension: Yes - Mother,Father; Kidney Disease: No; Lung Disease: Yes - Siblings; Seizures: No; Stroke: Yes - Mother; AILEE, PATES. ([id number]) Thyroid Problems: No; Tuberculosis: No; Never smoker; Marital Status - Married; Alcohol Use: Never; Drug Use: No History; Caffeine Use: Never; Financial Concerns: No; Food, Clothing or Shelter Needs: No; Support System Lacking: No; Transportation Concerns: No Physician Affirmation I have reviewed and agree with the above information. Electronic Signature(s) Signed: 12/03/2018 2:58:29 PM By: Montey Hora Signed: 12/03/2018 5:10:39 PM By: Worthy Keeler PA-C Entered By: Worthy Keeler on 12/03/2018 10:04:54 Spiker, Lillan W. ([id number]) -------------------------------------------------------------------------------- SuperBill Details Patient Name: Stacie Porter Date of Service: 12/03/2018 Medical Record Number: [id number] Patient Account Number: [id number] Date of Birth/Sex: [birth year] (73 y.o. 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

## 2018-12-03 NOTE — Progress Notes (Signed)
LYRIKA, SOUDERS ([id number]) Visit Report for 12/03/2018 Arrival Information Details Patient Name: Stacie Porter, Stacie Porter Date of Service: 12/03/2018 9:15 AM Medical Record Number: [id number] Patient Account Number: [id number] Date of Birth/Sex: 12-09-1945 (73 y.o. F) Treating RN: Army Melia Primary Care Kaityln Kallstrom: Fenton Malling Other Clinician: Referring Mong Neal: Fenton Malling Treating Payne Garske/Extender: Melburn Hake, HOYT Weeks in Treatment: 7 Visit Information History Since Last Visit Added or deleted any medications: No Patient Arrived: Ambulatory Any new allergies or adverse reactions: No Arrival Time: 09:19 Had a fall or experienced change in No Accompanied By: self activities of daily living that may affect Transfer Assistance: None risk of falls: Patient Has Alerts: Yes Signs or symptoms of abuse/neglect since last visito No Patient Alerts: Patient on Blood Thinner Hospitalized since last visit: No DM II Has Dressing in Place as Prescribed: Yes aspirin 81 Pain Present Now: No Electronic Signature(s) Signed: 12/03/2018 11:09:11 AM By: Army Melia Entered By: Army Melia on 12/03/2018 09:19:48 Humphreys, Guida Viona Gilmore ([id number]) -------------------------------------------------------------------------------- Clinic Level of Care Assessment Details Patient Name: Stacie Porter Date of Service: 12/03/2018 9:15 AM Medical Record Number: [id number] Patient Account Number: [id number] Date of Birth/Sex: [birth year] (73 y.o. F) Treating RN: Montey Hora Primary Care Lidie Glade: Fenton Malling Other Clinician: Referring Eliyas Suddreth: Fenton Malling Treating Ahmod Gillespie/Extender: Melburn Hake, HOYT Weeks in Treatment: 7 Clinic Level of Care Assessment Items TOOL 4 Quantity Score []  - Use when only an EandM is performed on FOLLOW-UP visit 0 ASSESSMENTS - Nursing Assessment / Reassessment X - Reassessment of Co-morbidities (includes updates in patient status) 1 10 X- 1 5 Reassessment of  Adherence to Treatment Plan ASSESSMENTS - Wound and Skin Assessment / Reassessment []  - Simple Wound Assessment / Reassessment - one wound 0 []  - 0 Complex Wound Assessment / Reassessment - multiple wounds X- 1 10 Dermatologic / Skin Assessment (not related to wound area) ASSESSMENTS - Focused Assessment []  - Circumferential Edema Measurements - multi extremities 0 []  - 0 Nutritional Assessment / Counseling / Intervention X- 1 5 Lower Extremity Assessment (monofilament, tuning fork, pulses) []  - 0 Peripheral Arterial Disease Assessment (using hand held doppler) ASSESSMENTS - Ostomy and/or Continence Assessment and Care []  - Incontinence Assessment and Management 0 []  - 0 Ostomy Care Assessment and Management (repouching, etc.) PROCESS - Coordination of Care X - Simple Patient / Family Education for ongoing care 1 15 []  - 0 Complex (extensive) Patient / Family Education for ongoing care X- 1 10 Staff obtains Programmer, systems, Records, Test Results / Process Orders []  - 0 Staff telephones HHA, Nursing Homes / Clarify orders / etc []  - 0 Routine Transfer to another Facility (non-emergent condition) []  - 0 Routine Hospital Admission (non-emergent condition) []  - 0 New Admissions / Biomedical engineer / Ordering NPWT, Apligraf, etc. []  - 0 Emergency Hospital Admission (emergent condition) X- 1 10 Simple Discharge Coordination Mohiuddin, Shanira W. ([id number]) []  - 0 Complex (extensive) Discharge Coordination PROCESS - Special Needs []  - Pediatric / Minor Patient Management 0 []  - 0 Isolation Patient Management []  - 0 Hearing / Language / Visual special needs []  - 0 Assessment of Community assistance (transportation, D/C planning, etc.) []  - 0 Additional assistance / Altered mentation []  - 0 Support Surface(s) Assessment (bed, cushion, seat, etc.) INTERVENTIONS - Wound Cleansing / Measurement []  - Simple Wound Cleansing - one wound 0 []  - 0 Complex Wound Cleansing - multiple  wounds X- 1 5 Wound Imaging (photographs - any number of wounds) []  - 0 Wound Tracing (instead of photographs) []  -  0 Simple Wound Measurement - one wound []  - 0 Complex Wound Measurement - multiple wounds INTERVENTIONS - Wound Dressings []  - Small Wound Dressing one or multiple wounds 0 []  - 0 Medium Wound Dressing one or multiple wounds []  - 0 Large Wound Dressing one or multiple wounds X- 1 5 Application of Medications - topical []  - 0 Application of Medications - injection INTERVENTIONS - Miscellaneous []  - External ear exam 0 []  - 0 Specimen Collection (cultures, biopsies, blood, body fluids, etc.) []  - 0 Specimen(s) / Culture(s) sent or taken to Lab for analysis []  - 0 Patient Transfer (multiple staff / Civil Service fast streamer / Similar devices) X- 1 5 Simple Staple / Suture removal (25 or less) []  - 0 Complex Staple / Suture removal (26 or more) []  - 0 Hypo / Hyperglycemic Management (close monitor of Blood Glucose) []  - 0 Ankle / Brachial Index (ABI) - do not check if billed separately X- 1 5 Vital Signs Hagerty, Luwanna W. ([id number]) Has the patient been seen at the hospital within the last three years: Yes Total Score: 85 Level Of Care: New/Established - Level 3 Electronic Signature(s) Signed: 12/03/2018 2:58:29 PM By: Montey Hora Entered By: Montey Hora on 12/03/2018 10:20:20 Tates, Leandra Kern ([id number]) -------------------------------------------------------------------------------- Encounter Discharge Information Details Patient Name: Stacie Porter Date of Service: 12/03/2018 9:15 AM Medical Record Number: [id number] Patient Account Number: [id number] Date of Birth/Sex: 31-Jul-1946 (73 y.o. F) Treating RN: Harold Barban Primary Care Rakisha Pincock: Fenton Malling Other Clinician: Referring Darlette Dubow: Fenton Malling Treating Arissa Fagin/Extender: Melburn Hake, HOYT Weeks in Treatment: 7 Encounter Discharge Information Items Discharge Condition: Stable Ambulatory  Status: Ambulatory Discharge Destination: Home Transportation: Private Auto Accompanied By: self Schedule Follow-up Appointment: Yes Clinical Summary of Care: Electronic Signature(s) Signed: 12/03/2018 10:38:08 AM By: Harold Barban Entered By: Harold Barban on 12/03/2018 10:38:08 Costlow, Leandra Kern ([id number]) -------------------------------------------------------------------------------- General Visit Notes Details Patient Name: Stacie Porter Date of Service: 12/03/2018 9:15 AM Medical Record Number: [id number] Patient Account Number: [id number] Date of Birth/Sex: 06-12-1946 (73 y.o. F) Treating RN: Montey Hora Primary Care Brittany Osier: Fenton Malling Other Clinician: Referring Vandana Haman: Fenton Malling Treating Clayton Jarmon/Extender: Melburn Hake, HOYT Weeks in Treatment: 7 Notes 5 sutures removed by Margarita Grizzle at surgical site Electronic Signature(s) Signed: 12/03/2018 2:58:29 PM By: Montey Hora Entered By: Montey Hora on 12/03/2018 09:43:05 Jagodzinski, Latisia W. ([id number]) -------------------------------------------------------------------------------- Lower Extremity Assessment Details Patient Name: Stacie Porter Date of Service: 12/03/2018 9:15 AM Medical Record Number: [id number] Patient Account Number: [id number] Date of Birth/Sex: 12-16-1945 (73 y.o. F) Treating RN: Army Melia Primary Care Bretta Fees: Fenton Malling Other Clinician: Referring Fuller Makin: Fenton Malling Treating Abishai Viegas/Extender: Melburn Hake, HOYT Weeks in Treatment: 7 Edema Assessment Assessed: [Left: No] [Right: No] Edema: [Left: N] [Right: o] Vascular Assessment Pulses: Dorsalis Pedis Palpable: [Right:Yes] Electronic Signature(s) Signed: 12/03/2018 11:09:11 AM By: Army Melia Entered By: Army Melia on 12/03/2018 09:26:06 Pitcock, Lyanna W. ([id number]) -------------------------------------------------------------------------------- Non-Wound Condition Assessment Details Patient Name: Stacie Porter Date of Service: 12/03/2018 9:15 AM Medical Record Number: [id number] Patient Account Number: [id number] Date of Birth/Sex: 11-25-1945 (73 y.o. 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 F) Treating RN: Army Melia Primary Care Lasaro Primm: Fenton Malling Other Clinician: Referring Demtrius Rounds: Fenton Malling Treating Liliyana Thobe/Extender: Melburn Hake, HOYT Weeks in Treatment: 7 Active Problems Location of Pain Severity and Description of Pain Patient Has Paino No Site Locations Pain Management and Medication Current Pain Management: Electronic Signature(s) Signed: 12/03/2018 11:09:11 AM By: Army Melia Entered By: Army Melia on 12/03/2018 09:19:54 Whitner, Zineb Viona Gilmore ([id number]) -------------------------------------------------------------------------------- Patient/Caregiver Education Details Patient Name: Stacie Porter Date of Service: 12/03/2018 9:15  AM Medical Record Number: [id number] Patient Account Number: [id number] Date of Birth/Gender: May 06, 1946 (73 y.o. F) Treating RN: Montey Hora Primary Care Physician: Fenton Malling Other Clinician: Referring Physician: Fenton Malling Treating Physician/Extender: Sharalyn Ink in Treatment: 7 Education Assessment Education Provided To: Patient Education Topics Provided Basic Hygiene: Handouts: Other: care of newly healed amputation site Methods: Demonstration, Explain/Verbal Responses: State content correctly Electronic Signature(s) Signed: 12/03/2018 2:58:29 PM By: Montey Hora Entered By: Montey Hora on 12/03/2018 10:20:45 Blanda, Ramanda Viona Gilmore ([id number]) -------------------------------------------------------------------------------- [place] Details Patient Name: Stacie Porter Date of Service: 12/03/2018 9:15 AM Medical Record Number: [id number] Patient Account Number: [id number] Date of Birth/Sex: 1945-11-07 (73 y.o. F) Treating RN: Army Melia Primary Care Telitha Plath: Fenton Malling Other Clinician: Referring Julus Kelley: Fenton Malling Treating Terilyn Sano/Extender: Melburn Hake, HOYT Weeks in Treatment: 7 Vital Signs Time Taken: 09:19 Temperature (F): 98.5 Height (in): 65 Pulse (bpm): 54 Weight (lbs): 226.3 Respiratory Rate (breaths/min): 16 Body Mass Index (BMI): 37.7 Blood Pressure (mmHg): 131/60 Reference Range: 80 - 120 mg / dl Electronic Signature(s) Signed: 12/03/2018 11:09:11 AM By: Army Melia Entered By: Army Melia on 12/03/2018 09:20:21

## 2018-12-16 ENCOUNTER — Other Ambulatory Visit: Payer: Self-pay

## 2018-12-16 ENCOUNTER — Encounter: Payer: Medicare Other | Attending: Physician Assistant | Admitting: *Deleted

## 2018-12-16 ENCOUNTER — Encounter: Payer: Self-pay | Admitting: *Deleted

## 2018-12-16 VITALS — BP 120/70 | Wt 227.1 lb

## 2018-12-16 DIAGNOSIS — E11621 Type 2 diabetes mellitus with foot ulcer: Secondary | ICD-10-CM | POA: Insufficient documentation

## 2018-12-16 DIAGNOSIS — L97509 Non-pressure chronic ulcer of other part of unspecified foot with unspecified severity: Secondary | ICD-10-CM

## 2018-12-16 DIAGNOSIS — Z881 Allergy status to other antibiotic agents status: Secondary | ICD-10-CM | POA: Diagnosis not present

## 2018-12-16 DIAGNOSIS — Z883 Allergy status to other anti-infective agents status: Secondary | ICD-10-CM | POA: Insufficient documentation

## 2018-12-16 DIAGNOSIS — E1169 Type 2 diabetes mellitus with other specified complication: Secondary | ICD-10-CM | POA: Insufficient documentation

## 2018-12-16 DIAGNOSIS — I482 Chronic atrial fibrillation, unspecified: Secondary | ICD-10-CM | POA: Diagnosis not present

## 2018-12-16 DIAGNOSIS — Z888 Allergy status to other drugs, medicaments and biological substances status: Secondary | ICD-10-CM | POA: Insufficient documentation

## 2018-12-16 DIAGNOSIS — E1140 Type 2 diabetes mellitus with diabetic neuropathy, unspecified: Secondary | ICD-10-CM | POA: Insufficient documentation

## 2018-12-16 DIAGNOSIS — Z886 Allergy status to analgesic agent status: Secondary | ICD-10-CM | POA: Insufficient documentation

## 2018-12-16 DIAGNOSIS — Z833 Family history of diabetes mellitus: Secondary | ICD-10-CM | POA: Insufficient documentation

## 2018-12-16 DIAGNOSIS — Z836 Family history of other diseases of the respiratory system: Secondary | ICD-10-CM | POA: Insufficient documentation

## 2018-12-16 DIAGNOSIS — I10 Essential (primary) hypertension: Secondary | ICD-10-CM | POA: Insufficient documentation

## 2018-12-16 DIAGNOSIS — M86671 Other chronic osteomyelitis, right ankle and foot: Secondary | ICD-10-CM | POA: Insufficient documentation

## 2018-12-16 DIAGNOSIS — L97512 Non-pressure chronic ulcer of other part of right foot with fat layer exposed: Secondary | ICD-10-CM | POA: Diagnosis not present

## 2018-12-16 DIAGNOSIS — Z809 Family history of malignant neoplasm, unspecified: Secondary | ICD-10-CM | POA: Diagnosis not present

## 2018-12-16 DIAGNOSIS — Z8249 Family history of ischemic heart disease and other diseases of the circulatory system: Secondary | ICD-10-CM | POA: Insufficient documentation

## 2018-12-16 NOTE — Progress Notes (Signed)
Diabetes Self-Management Education  Visit Type:  Follow-up  Appt. Start Time: 1025 Appt. End Time: 1120  12/16/2018  [name], identified by name and date of birth, is a 73 y.o. female with a diagnosis of Diabetes:  .   ASSESSMENT  Blood pressure 120/70, weight 227 lb 1.6 oz (103 kg). Body mass index is 37.79 kg/m.   Diabetes Self-Management Education - 12/16/18 1316      Initial Visit   What type of meal plan do you follow?  weight watchers      Psychosocial Assessment   Self-care barriers  None      Complications   How often do you check your blood sugar?  3-4 times / week    Fasting Blood glucose range (mg/dL)  130-179   FBG's 140-160's mg/dL   Postprandial Blood glucose range (mg/dL)  130-179;180-200   pp's 158-200 mg/dL   Have you had a dilated eye exam in the past 12 months?  Yes    Have you had a dental exam in the past 12 months?  Yes    Are you checking your feet?  Yes   removal of toe due to infection; sutures removed   How many days per week are you checking your feet?  7      Dietary Intake   Breakfast  eats 3 meals and 1-2 snacks/day    Snack (evening)  having fruit at bedtime with splenda/brown sugar blend    Beverage(s)  now drinking tea with artificial sweetener and no sugar      Exercise   Exercise Type  Light (walking / raking leaves)   walking and using exerise tape   How many days per week to you exercise?  4    How many minutes per day do you exercise?  30    Total minutes per week of exercise  120      Patient Education   Disease state   Explored patient's options for treatment of their diabetes    Nutrition management   Carbohydrate counting;Information on hints to eating out and maintain blood glucose control.;Food label reading, portion sizes and measuring food.;Reviewed blood glucose goals for pre and post meals and how to evaluate the patients' food intake on their blood glucose level.    Physical activity and exercise   Role of  exercise on diabetes management, blood pressure control and cardiac health.    Medications  Reviewed patients medication for diabetes, action, purpose, timing of dose and side effects.    Monitoring  Purpose and frequency of SMBG.;Taught/discussed recording of test results and interpretation of SMBG.;Identified appropriate SMBG and/or A1C goals.    Chronic complications  Assessed and discussed foot care and prevention of foot problems    Psychosocial adjustment  Identified and addressed patients feelings and concerns about diabetes      Individualized Goals (developed by patient)   Nutrition  Follow meal plan discussed   Limit fruit at bedtime; have serving of protein when eating fruit for snack   Physical Activity  Exercise 3-5 times per week    Monitoring   test my blood glucose as discussed    Reducing Risk  examine blood glucose patterns;do foot checks daily      Post-Education Assessment   Patient understands the diabetes disease and treatment process.  Demonstrates understanding / competency    Patient understands incorporating nutritional management into lifestyle.  Needs Review    Patient undertands incorporating physical activity into lifestyle.  Demonstrates  understanding / competency    Patient understands using medications safely.  Demonstrates understanding / competency    Patient understands monitoring blood glucose, interpreting and using results  Demonstrates understanding / competency    Patient understands prevention, detection, and treatment of acute complications.  Needs Review    Patient understands prevention, detection, and treatment of chronic complications.  Demonstrates understanding / competency    Patient understands how to develop strategies to address psychosocial issues.  Demonstrates understanding / competency    Patient understands how to develop strategies to promote health/change behavior.  Demonstrates understanding / competency      Outcomes   Program  Status  Completed      Subsequent Visit   Since your last visit have you continued or begun to take your medications as prescribed?  Yes    Since your last visit have you had your blood pressure checked?  No    Since your last visit have you experienced any weight changes?  Gain    Weight Gain (lbs)  5    Since your last visit, are you checking your blood glucose at least once a day?  Yes   checking 3 x week as ordered      Learning Objective:  Patient will have a greater understanding of diabetes self-management. Patient education plan is to attend individual and/or group sessions per assessed needs and concerns.   Plan:   Patient Instructions  Check blood sugars 1 x day before breakfast or 2 hrs after one meal 3 x week  Continue exercise video or walking for  30 minutes  4 days a week Eat 3 meals day,  1-2 snacks a day Space meals 4-6 hours apart Limit fruit at bedtime Include 1 serving of protein when eating fruit for snack Call back for any questions  Expected Outcomes:  Demonstrated interest in learning. Expect positive outcomes  Education material provided:  Planning a Well Balanced Meal  If problems or questions, patient to contact team via:  Johny Drilling, RN, CCM, CDE [phone]  Future DSME appointment: - prn She has completed the 2 Hour Diabetes Refresher Program.

## 2018-12-16 NOTE — Patient Instructions (Signed)
Check blood sugars 1 x day before breakfast or 2 hrs after one meal 3 x week   Continue exercise video or walking for  30 minutes  4 days a week  Eat 3 meals day,  1-2 snacks a day Space meals 4-6 hours apart Limit fruit at bedtime Include 1 serving of protein when eating fruit for snack  Call back for any questions

## 2018-12-30 ENCOUNTER — Telehealth: Payer: Self-pay | Admitting: *Deleted

## 2018-12-30 NOTE — Telephone Encounter (Signed)
Received voice mail from patient requesting a call back. She had questions about her fasting blood sugars. Called her back and she reports that she stopped eating fruit at bedtime but still has fasting readings in the 140's mg/dL. She hasn't been checking post meals. The patient had questions about daily glucose and A1C's.. Provided education. She has an appointment next week with MD. She is taking [place] but couldn't tolerate Metformin. Discussed actions, side effects of medications. She will discuss with MD.

## 2019-01-06 ENCOUNTER — Ambulatory Visit (INDEPENDENT_AMBULATORY_CARE_PROVIDER_SITE_OTHER): Payer: Medicare Other | Admitting: Physician Assistant

## 2019-01-06 ENCOUNTER — Other Ambulatory Visit: Payer: Self-pay

## 2019-01-06 ENCOUNTER — Encounter: Payer: Self-pay | Admitting: Physician Assistant

## 2019-01-06 VITALS — BP 152/76 | HR 53 | Temp 98.0°F | Resp 16 | Wt 227.0 lb

## 2019-01-06 DIAGNOSIS — L97509 Non-pressure chronic ulcer of other part of unspecified foot with unspecified severity: Secondary | ICD-10-CM

## 2019-01-06 DIAGNOSIS — E11621 Type 2 diabetes mellitus with foot ulcer: Secondary | ICD-10-CM

## 2019-01-06 DIAGNOSIS — I480 Paroxysmal atrial fibrillation: Secondary | ICD-10-CM | POA: Diagnosis not present

## 2019-01-06 DIAGNOSIS — D473 Essential (hemorrhagic) thrombocythemia: Secondary | ICD-10-CM | POA: Diagnosis not present

## 2019-01-06 DIAGNOSIS — Z6837 Body mass index (BMI) 37.0-37.9, adult: Secondary | ICD-10-CM

## 2019-01-06 LAB — POCT GLYCOSYLATED HEMOGLOBIN (HGB A1C)
Est. average glucose Bld gHb Est-mCnc: 154
Hemoglobin A1C: 7 % — AB (ref 4.0–5.6)

## 2019-01-06 NOTE — Patient Instructions (Signed)
Diabetes Mellitus and Exercise Exercising regularly is important for your overall health, especially when you have diabetes (diabetes mellitus). Exercising is not only about losing weight. It has many other health benefits, such as increasing muscle strength and bone density and reducing body fat and stress. This leads to improved fitness, flexibility, and endurance, all of which result in better overall health. Exercise has additional benefits for people with diabetes, including:  Reducing appetite.  Helping to lower and control blood glucose.  Lowering blood pressure.  Helping to control amounts of fatty substances (lipids) in the blood, such as cholesterol and triglycerides.  Helping the body to respond better to insulin (improving insulin sensitivity).  Reducing how much insulin the body needs.  Decreasing the risk for heart disease by: ? Lowering cholesterol and triglyceride levels. ? Increasing the levels of good cholesterol. ? Lowering blood glucose levels. What is my activity plan? Your health care provider or certified diabetes educator can help you make a plan for the type and frequency of exercise (activity plan) that works for you. Make sure that you:  Do at least 150 minutes of moderate-intensity or vigorous-intensity exercise each week. This could be brisk walking, biking, or water aerobics. ? Do stretching and strength exercises, such as yoga or weightlifting, at least 2 times a week. ? Spread out your activity over at least 3 days of the week.  Get some form of physical activity every day. ? Do not go more than 2 days in a row without some kind of physical activity. ? Avoid being inactive for more than 30 minutes at a time. Take frequent breaks to walk or stretch.  Choose a type of exercise or activity that you enjoy, and set realistic goals.  Start slowly, and gradually increase the intensity of your exercise over time. What do I need to know about managing my  diabetes?   Check your blood glucose before and after exercising. ? If your blood glucose is 240 mg/dL (13.3 mmol/L) or higher before you exercise, check your urine for ketones. If you have ketones in your urine, do not exercise until your blood glucose returns to normal. ? If your blood glucose is 100 mg/dL (5.6 mmol/L) or lower, eat a snack containing 15-20 grams of carbohydrate. Check your blood glucose 15 minutes after the snack to make sure that your level is above 100 mg/dL (5.6 mmol/L) before you start your exercise.  Know the symptoms of low blood glucose (hypoglycemia) and how to treat it. Your risk for hypoglycemia increases during and after exercise. Common symptoms of hypoglycemia can include: ? Hunger. ? Anxiety. ? Sweating and feeling clammy. ? Confusion. ? Dizziness or feeling light-headed. ? Increased heart rate or palpitations. ? Blurry vision. ? Tingling or numbness around the mouth, lips, or tongue. ? Tremors or shakes. ? Irritability.  Keep a rapid-acting carbohydrate snack available before, during, and after exercise to help prevent or treat hypoglycemia.  Avoid injecting insulin into areas of the body that are going to be exercised. For example, avoid injecting insulin into: ? The arms, when playing tennis. ? The legs, when jogging.  Keep records of your exercise habits. Doing this can help you and your health care provider adjust your diabetes management plan as needed. Write down: ? Food that you eat before and after you exercise. ? Blood glucose levels before and after you exercise. ? The type and amount of exercise you have done. ? When your insulin is expected to peak, if you use   insulin. Avoid exercising at times when your insulin is peaking.  When you start a new exercise or activity, work with your health care provider to make sure the activity is safe for you, and to adjust your insulin, medicines, or food intake as needed.  Drink plenty of water while  you exercise to prevent dehydration or heat stroke. Drink enough fluid to keep your urine clear or pale yellow. Summary  Exercising regularly is important for your overall health, especially when you have diabetes (diabetes mellitus).  Exercising has many health benefits, such as increasing muscle strength and bone density and reducing body fat and stress.  Your health care provider or certified diabetes educator can help you make a plan for the type and frequency of exercise (activity plan) that works for you.  When you start a new exercise or activity, work with your health care provider to make sure the activity is safe for you, and to adjust your insulin, medicines, or food intake as needed. This information is not intended to replace advice given to you by your health care provider. Make sure you discuss any questions you have with your health care provider. Document Released: 11/04/2003 Document Revised: 02/22/2017 Document Reviewed: 01/24/2016 Elsevier Interactive Patient Education  2019 Elsevier Inc.  

## 2019-01-06 NOTE — Progress Notes (Signed)
Patient: Stacie Porter Female    DOB: Jun 19, 1946   73 y.o.   MRN: [id number] Visit Date: 01/06/2019  Today's Provider: Mar Daring, PA-C   Chief Complaint  Patient presents with  . Follow-up  . Diabetes   Subjective:     HPI   Type 2 diabetes mellitus with foot ulcer, without long-term current use of insulin ([place]) From 10/04/2018-started metformin. Discussed dietary changes and limiting carbohydrates. I will see her back in 3 months. Ambulatory referral to [facility]  Since last visit patient had to have 2nd toe amputated due to the ulcer with tendon exposure. Overall she is feeling well.   Allergies  Allergen Reactions  . Ibuprofen     Elevates BP if taken continuously  . Meloxicam Swelling  . Multaq [Dronedarone]     Unknown reaction      Current Outpatient Medications:  .  aspirin 81 MG EC tablet, TAKE 1 TABLET BY MOUTH EVERY DAY (Patient taking differently: Take 81 mg by mouth at bedtime. ), Disp: 90 tablet, Rfl: 3 .  Calcium Carbonate-Vitamin D 600-200 MG-UNIT CAPS, Take 1 tablet by mouth daily., Disp: , Rfl:  .  dapagliflozin propanediol (FARXIGA) 5 MG TABS tablet, Take 5 mg by mouth daily., Disp: 30 tablet, Rfl: 3 .  Glucosamine HCl-MSM (GLUCOSAMINE-MSM PO), Take 1 tablet by mouth at bedtime., Disp: , Rfl:  .  HYDROcodone-acetaminophen (NORCO) 5-325 MG tablet, Take 1-2 tablets by mouth every 6 (six) hours as needed for moderate pain., Disp: 30 tablet, Rfl: 0 .  metoprolol tartrate (LOPRESSOR) 25 MG tablet, Take 25 mg by mouth 2 (two) times daily. , Disp: , Rfl:  .  MULTIPLE VITAMIN PO, Take 1 tablet by mouth daily., Disp: , Rfl:  .  Omega-3 Fatty Acids (FISH OIL) 1200 MG CAPS, Take 1,200 mg by mouth daily., Disp: , Rfl:  .  omeprazole (PRILOSEC) 20 MG capsule, TAKE 1 CAPSULES BY MOUTH EVERY DAY (Patient taking differently: Take 20 mg by mouth daily. ), Disp: 90 capsule, Rfl: 1 .  simvastatin (ZOCOR) 20 MG tablet, TAKE 1 TABLET BY MOUTH DAILY (Patient  taking differently: Take 20 mg by mouth every evening. ), Disp: 90 tablet, Rfl: 2 .  cefUROXime (CEFTIN) 500 MG tablet, Take 500 mg by mouth every evening. , Disp: , Rfl:  .  hydrochlorothiazide (HYDRODIURIL) 25 MG tablet, Take 25 mg by mouth daily. , Disp: , Rfl:  .  levofloxacin (LEVAQUIN) 500 MG tablet, Take 1 tablet (500 mg total) by mouth daily. Started: 11/02/2018 Will finish: 11/16/2018 (Patient not taking: Reported on 01/06/2019), Disp: 5 tablet, Rfl: 0 .  lisinopril (PRINIVIL,ZESTRIL) 40 MG tablet, Take 40 mg by mouth daily. , Disp: , Rfl:  .  nystatin (MYCOSTATIN/NYSTOP) powder, Apply 1 g topically every other day. Apply topically to the space between the big toe and second toe with dressing changes, Disp: , Rfl:   Review of Systems  Constitutional: Negative for appetite change, chills, fatigue and fever.  Respiratory: Negative for chest tightness and shortness of breath.   Cardiovascular: Negative for chest pain and palpitations.  Gastrointestinal: Negative for abdominal pain, nausea and vomiting.  Neurological: Negative for dizziness and weakness.    Social History   Tobacco Use  . Smoking status: Never Smoker  . Smokeless tobacco: Never Used  Substance Use Topics  . Alcohol use: No      Objective:   BP (!) 152/76 (BP Location: Left Arm, Patient Position: Sitting, Cuff  Size: Large)   Pulse (!) 53   Temp 98 F (36.7 C) (Oral)   Resp 16   Wt 227 lb (103 kg)   SpO2 93%   BMI 37.77 kg/m  Vitals:   01/06/19 0817  BP: (!) 152/76  Pulse: (!) 53  Resp: 16  Temp: 98 F (36.7 C)  TempSrc: Oral  SpO2: 93%  Weight: 227 lb (103 kg)     Physical Exam Vitals signs reviewed.  Constitutional:      General: She is not in acute distress.    Appearance: Normal appearance. She is well-developed. She is obese. She is not ill-appearing or diaphoretic.  Neck:     Musculoskeletal: Normal range of motion and neck supple.     Thyroid: No thyromegaly.     Vascular: No JVD.      Trachea: No tracheal deviation.  Cardiovascular:     Rate and Rhythm: Normal rate and regular rhythm.     Heart sounds: Normal heart sounds. No murmur. No friction rub. No gallop.   Pulmonary:     Effort: Pulmonary effort is normal. No respiratory distress.     Breath sounds: Normal breath sounds. No wheezing or rales.  Lymphadenopathy:     Cervical: No cervical adenopathy.  Neurological:     Mental Status: She is alert.         Assessment & Plan    1. Type 2 diabetes mellitus with foot ulcer, without long-term current use of insulin (HCC) A1c up slightly to 7.0 from 6.8. Patient has had multiple infections including the 2nd toe and now a tooth. She has been trying to eat healthier and has started exercising daily.  - POCT glycosylated hemoglobin (Hb A1C)  2. Class 2 severe obesity due to excess calories with serious comorbidity and body mass index (BMI) of 37.0 to 37.9 in adult [facility]) Counseled patient on healthy lifestyle modifications including dieting and exercise.  She is also seeing Nutritionist.  3. Paroxysmal atrial fibrillation ([place]) Stable. Followed by Cardiology.  4. Essential thrombocythemia ([place]) Improved.     Mar Daring, PA-C  [facility]

## 2019-01-31 ENCOUNTER — Other Ambulatory Visit: Payer: Self-pay | Admitting: Physician Assistant

## 2019-01-31 DIAGNOSIS — E11621 Type 2 diabetes mellitus with foot ulcer: Secondary | ICD-10-CM

## 2019-01-31 DIAGNOSIS — L97509 Non-pressure chronic ulcer of other part of unspecified foot with unspecified severity: Secondary | ICD-10-CM

## 2019-02-11 ENCOUNTER — Other Ambulatory Visit: Payer: Self-pay | Admitting: Physician Assistant

## 2019-02-11 DIAGNOSIS — K2100 Gastro-esophageal reflux disease with esophagitis, without bleeding: Secondary | ICD-10-CM

## 2019-02-27 ENCOUNTER — Other Ambulatory Visit: Payer: Self-pay | Admitting: Physician Assistant

## 2019-02-27 DIAGNOSIS — E785 Hyperlipidemia, unspecified: Secondary | ICD-10-CM

## 2019-03-29 ENCOUNTER — Other Ambulatory Visit: Payer: Self-pay | Admitting: Physician Assistant

## 2019-03-29 DIAGNOSIS — E11621 Type 2 diabetes mellitus with foot ulcer: Secondary | ICD-10-CM

## 2019-04-01 LAB — HM DIABETES EYE EXAM

## 2019-04-08 NOTE — Progress Notes (Signed)
Patient: Stacie Porter Female    DOB: 30-Mar-1946   73 y.o.   MRN: [id number] Visit Date: 04/09/2019  Today's Provider: Mar Daring, PA-C   Chief Complaint  Patient presents with  . Follow-up  . Diabetes   Subjective:     HPI   Type 2 diabetes mellitus: A1c up slightly to 7.0 from 6.8 three months ago.  Lab Results  Component Value Date   HGBA1C 7.1 (A) 04/09/2019   HGBA1C 7.0 (A) 01/06/2019   HGBA1C 6.8 (H) 09/23/2018    Management since then includes none. She reports good compliance with treatment. She is not having side effects. none Current symptoms include none and have been unchanged. Home blood sugar records: fasting range: 132-176  Episodes of hypoglycemia? no   Current insulin regiment: na Most Recent Eye Exam: last week with Dr. Gloriann Loan Weight trend: stable Current exercise: walking Current diet habits: well balanced  Pertinent Labs:    Component Value Date/Time   CHOL 152 09/23/2018 0000   TRIG 162 (H) 09/23/2018 0000   HDL 38 (L) 09/23/2018 0000   LDLCALC 82 09/23/2018 0000   CREATININE 0.99 11/07/2018 1029   CREATININE 0.65 12/03/2014 0054    Wt Readings from Last 3 Encounters:  04/09/19 228 lb (103.4 kg)  01/06/19 227 lb (103 kg)  12/16/18 227 lb 1.6 oz (103 kg)   ------------------------------------------------------------------------   Allergies  Allergen Reactions  . Ibuprofen     Elevates BP if taken continuously  . Meloxicam Swelling  . Multaq [Dronedarone]     Unknown reaction      Current Outpatient Medications:  .  aspirin 81 MG EC tablet, TAKE 1 TABLET BY MOUTH EVERY DAY (Patient taking differently: Take 81 mg by mouth at bedtime. ), Disp: 90 tablet, Rfl: 3 .  Calcium Carbonate-Vitamin D 600-200 MG-UNIT CAPS, Take 1 tablet by mouth daily., Disp: , Rfl:  .  FARXIGA 5 MG TABS tablet, TAKE 1 TABLET BY MOUTH EVERY DAY, Disp: 30 tablet, Rfl: 3 .  Glucosamine HCl-MSM (GLUCOSAMINE-MSM PO), Take 1 tablet by mouth at  bedtime., Disp: , Rfl:  .  hydrochlorothiazide (HYDRODIURIL) 25 MG tablet, Take 25 mg by mouth daily. , Disp: , Rfl:  .  lisinopril (PRINIVIL,ZESTRIL) 40 MG tablet, Take 40 mg by mouth daily. , Disp: , Rfl:  .  metoprolol tartrate (LOPRESSOR) 25 MG tablet, Take 25 mg by mouth 2 (two) times daily. , Disp: , Rfl:  .  MULTIPLE VITAMIN PO, Take 1 tablet by mouth daily., Disp: , Rfl:  .  Omega-3 Fatty Acids (FISH OIL) 1200 MG CAPS, Take 1,200 mg by mouth daily., Disp: , Rfl:  .  omeprazole (PRILOSEC) 20 MG capsule, TAKE 1 CAPSULES BY MOUTH EVERY DAY, Disp: 90 capsule, Rfl: 1 .  simvastatin (ZOCOR) 20 MG tablet, TAKE 1 TABLET BY MOUTH DAILY, Disp: 90 tablet, Rfl: 2 .  HYDROcodone-acetaminophen (NORCO) 5-325 MG tablet, Take 1-2 tablets by mouth every 6 (six) hours as needed for moderate pain., Disp: 30 tablet, Rfl: 0  Review of Systems  Constitutional: Negative for appetite change, chills, fatigue and fever.  Respiratory: Negative for chest tightness and shortness of breath.   Cardiovascular: Negative for chest pain and palpitations.  Gastrointestinal: Negative for abdominal pain, nausea and vomiting.  Endocrine: Negative for polydipsia, polyphagia and polyuria.  Neurological: Negative for dizziness and weakness.    Social History   Tobacco Use  . Smoking status: Never Smoker  . Smokeless tobacco:  Never Used  Substance Use Topics  . Alcohol use: No      Objective:   BP 134/71 (BP Location: Right Arm, Patient Position: Sitting, Cuff Size: Large)   Pulse (!) 57   Temp 97.7 F (36.5 C) (Oral)   Resp 16   Ht 5\' 5"  (1.651 m)   Wt 228 lb (103.4 kg)   SpO2 95%   BMI 37.94 kg/m  Vitals:   04/09/19 0817  BP: 134/71  Pulse: (!) 57  Resp: 16  Temp: 97.7 F (36.5 C)  TempSrc: Oral  SpO2: 95%  Weight: 228 lb (103.4 kg)  Height: 5\' 5"  (1.651 m)     Physical Exam Vitals signs reviewed.  Constitutional:      General: She is not in acute distress.    Appearance: Normal appearance.  She is well-developed. She is obese. She is not ill-appearing or diaphoretic.  Neck:     Musculoskeletal: Normal range of motion and neck supple.     Thyroid: No thyromegaly.     Vascular: No JVD.     Trachea: No tracheal deviation.  Cardiovascular:     Rate and Rhythm: Normal rate and regular rhythm.     Pulses: Normal pulses.     Heart sounds: Normal heart sounds. No murmur. No friction rub. No gallop.   Pulmonary:     Effort: Pulmonary effort is normal. No respiratory distress.     Breath sounds: Normal breath sounds. No wheezing or rales.  Musculoskeletal:     Right lower leg: No edema.     Left lower leg: No edema.  Lymphadenopathy:     Cervical: No cervical adenopathy.  Skin:    Capillary Refill: Capillary refill takes less than 2 seconds.  Neurological:     Mental Status: She is alert.      Results for orders placed or performed in visit on 04/09/19  POCT glycosylated hemoglobin (Hb A1C)  Result Value Ref Range   Hemoglobin A1C 7.1 (A) 4.0 - 5.6 %   Est. average glucose Bld gHb Est-mCnc 157        Assessment & Plan    1. Type 2 diabetes mellitus with foot ulcer, without long-term current use of insulin (HCC) A1c essentially stable at 7.1. Continue Farxiga 5mg  daily. On Lisinopril 40mg . Had eye exam with [facility] last week, records requested.      Mar Daring, PA-C  [facility]

## 2019-04-09 ENCOUNTER — Ambulatory Visit (INDEPENDENT_AMBULATORY_CARE_PROVIDER_SITE_OTHER): Payer: Medicare Other | Admitting: Physician Assistant

## 2019-04-09 ENCOUNTER — Encounter: Payer: Self-pay | Admitting: Physician Assistant

## 2019-04-09 ENCOUNTER — Other Ambulatory Visit: Payer: Self-pay

## 2019-04-09 VITALS — BP 134/71 | HR 57 | Temp 97.7°F | Resp 16 | Ht 65.0 in | Wt 228.0 lb

## 2019-04-09 DIAGNOSIS — E11621 Type 2 diabetes mellitus with foot ulcer: Secondary | ICD-10-CM

## 2019-04-09 DIAGNOSIS — L97509 Non-pressure chronic ulcer of other part of unspecified foot with unspecified severity: Secondary | ICD-10-CM

## 2019-04-09 LAB — POCT GLYCOSYLATED HEMOGLOBIN (HGB A1C)
Est. average glucose Bld gHb Est-mCnc: 157
Hemoglobin A1C: 7.1 % — AB (ref 4.0–5.6)

## 2019-04-09 NOTE — Patient Instructions (Signed)

## 2019-04-11 ENCOUNTER — Encounter: Payer: Self-pay | Admitting: Physician Assistant

## 2019-04-22 ENCOUNTER — Encounter: Payer: Self-pay | Admitting: Physician Assistant

## 2019-04-22 DIAGNOSIS — K2100 Gastro-esophageal reflux disease with esophagitis, without bleeding: Secondary | ICD-10-CM

## 2019-04-23 MED ORDER — OMEPRAZOLE 20 MG PO CPDR: 20.0000 mg | DELAYED_RELEASE_CAPSULE | Freq: Two times a day (BID) | ORAL | 1 refills | Status: DC

## 2019-05-05 ENCOUNTER — Encounter: Payer: Self-pay | Admitting: Physician Assistant

## 2019-05-20 NOTE — Progress Notes (Signed)
Patient: Stacie Porter Female    DOB: 08-02-1946   73 y.o.   MRN: [id number] Visit Date: 05/21/2019  Today's Provider: Mar Daring, PA-C   Chief Complaint  Patient presents with  . Follow-up  . Gastroesophageal Reflux   Subjective:     HPI   GERD: Paitent complains of heartburn. This has been associated with abdominal bloating, belching and nausea.  She denies chest pain, choking on food, difficulty swallowing and dysphagia. Symptoms have been present for greater than 1 month. She has been taking Omeprazole 20mg  BID x 2 months without relief.   She reports last weekend she was on vacation and had one slice of pizza. Then that night she was very bloated and uncomfortable. She stayed that way for two more days so she called and made an appointment. Monday and Tuesday of this week she reports symptoms improved. She does have associated nausea. Denies vomiting, constipation, diarrhea, melena or hematochezia. She does still have her gallbladder.     Allergies  Allergen Reactions  . Ibuprofen     Elevates BP if taken continuously  . Meloxicam Swelling  . Multaq [Dronedarone]     Unknown reaction      Current Outpatient Medications:  .  aspirin 81 MG EC tablet, TAKE 1 TABLET BY MOUTH EVERY DAY (Patient taking differently: Take 81 mg by mouth at bedtime. ), Disp: 90 tablet, Rfl: 3 .  Calcium Carbonate-Vitamin D 600-200 MG-UNIT CAPS, Take 1 tablet by mouth daily., Disp: , Rfl:  .  FARXIGA 5 MG TABS tablet, TAKE 1 TABLET BY MOUTH EVERY DAY, Disp: 30 tablet, Rfl: 3 .  Glucosamine HCl-MSM (GLUCOSAMINE-MSM PO), Take 1 tablet by mouth at bedtime., Disp: , Rfl:  .  hydrochlorothiazide (HYDRODIURIL) 25 MG tablet, Take 25 mg by mouth daily. , Disp: , Rfl:  .  lisinopril (PRINIVIL,ZESTRIL) 40 MG tablet, Take 40 mg by mouth daily. , Disp: , Rfl:  .  metoprolol tartrate (LOPRESSOR) 25 MG tablet, Take 25 mg by mouth 2 (two) times daily. , Disp: , Rfl:  .  MULTIPLE VITAMIN PO, Take 1  tablet by mouth daily., Disp: , Rfl:  .  Omega-3 Fatty Acids (FISH OIL) 1200 MG CAPS, Take 1,200 mg by mouth daily., Disp: , Rfl:  .  omeprazole (PRILOSEC) 20 MG capsule, Take 1 capsule (20 mg total) by mouth 2 (two) times daily before a meal., Disp: 180 capsule, Rfl: 1 .  simvastatin (ZOCOR) 20 MG tablet, TAKE 1 TABLET BY MOUTH DAILY, Disp: 90 tablet, Rfl: 2  Review of Systems  Constitutional: Negative for appetite change, chills, fatigue and fever.  Respiratory: Negative for chest tightness and shortness of breath.   Cardiovascular: Negative for chest pain and palpitations.  Gastrointestinal: Positive for abdominal distention and nausea. Negative for abdominal pain and vomiting.       Belching  Neurological: Negative for dizziness and weakness.    Social History   Tobacco Use  . Smoking status: Never Smoker  . Smokeless tobacco: Never Used  Substance Use Topics  . Alcohol use: No      Objective:   BP 136/67 (BP Location: Right Arm, Patient Position: Sitting, Cuff Size: Large)   Pulse (!) 58   Temp (!) 96.9 F (36.1 C) (Other (Comment))   Resp 16   Ht 5\' 5"  (1.651 m)   Wt 228 lb (103.4 kg)   SpO2 98%   BMI 37.94 kg/m  Vitals:   05/21/19 0946  BP: 136/67  Pulse: (!) 58  Resp: 16  Temp: (!) 96.9 F (36.1 C)  TempSrc: Other (Comment)  SpO2: 98%  Weight: 228 lb (103.4 kg)  Height: 5\' 5"  (1.651 m)  Body mass index is 37.94 kg/m.   Physical Exam Vitals signs reviewed.  Constitutional:      General: She is not in acute distress.    Appearance: Normal appearance. She is well-developed. She is obese. She is not ill-appearing or diaphoretic.  Cardiovascular:     Rate and Rhythm: Normal rate and regular rhythm.     Heart sounds: Normal heart sounds. No murmur. No friction rub. No gallop.   Pulmonary:     Effort: Pulmonary effort is normal. No respiratory distress.     Breath sounds: Normal breath sounds. No wheezing or rales.  Abdominal:     General: Bowel sounds  are normal. There is no distension.     Palpations: Abdomen is soft. There is no mass.     Tenderness: There is no abdominal tenderness. There is no guarding or rebound.  Skin:    General: Skin is warm and dry.  Neurological:     Mental Status: She is alert and oriented to person, place, and time.      No results found for any visits on 05/21/19.     Assessment & Plan    1. Gastroesophageal reflux disease, esophagitis presence not specified Stop Omeprazole. Dexilant 60mg  samples given for 15 days. She is to call if symptoms are not improving or worsening and will refer to GI. Will also get [place] as below to make sure no gallstones or gallbladder disease causing symptoms since mostly associated with eating. Does have h/o gastritis. I will f/u pending results and pending success/failure of medication changes.   2. Belching See above medical treatment plan. - US Abdomen Limited RUQ; Future  3. Abdominal bloating See above medical treatment plan. - US Abdomen Limited RUQ; Future     Mar Daring, PA-C  [facility]

## 2019-05-21 ENCOUNTER — Other Ambulatory Visit: Payer: Self-pay

## 2019-05-21 ENCOUNTER — Ambulatory Visit (INDEPENDENT_AMBULATORY_CARE_PROVIDER_SITE_OTHER): Payer: Medicare Other | Admitting: Physician Assistant

## 2019-05-21 ENCOUNTER — Encounter: Payer: Self-pay | Admitting: Physician Assistant

## 2019-05-21 VITALS — BP 136/67 | HR 58 | Temp 96.9°F | Resp 16 | Ht 65.0 in | Wt 228.0 lb

## 2019-05-21 DIAGNOSIS — R142 Eructation: Secondary | ICD-10-CM | POA: Diagnosis not present

## 2019-05-21 DIAGNOSIS — R140 Abdominal distension (gaseous): Secondary | ICD-10-CM

## 2019-05-21 DIAGNOSIS — K219 Gastro-esophageal reflux disease without esophagitis: Secondary | ICD-10-CM | POA: Diagnosis not present

## 2019-05-21 NOTE — Patient Instructions (Signed)
Abdominal Bloating When you have abdominal bloating, your abdomen may feel full, tight, or painful. It may also look bigger than normal or swollen (distended). Common causes of abdominal bloating include:  Swallowing air.  Constipation.  Problems digesting food.  Eating too much.  Irritable bowel syndrome. This is a condition that affects the large intestine.  Lactose intolerance. This is an inability to digest lactose, a natural sugar in dairy products.  Celiac disease. This is a condition that affects the ability to digest gluten, a protein found in some grains.  Gastroparesis. This is a condition that slows down the movement of food in the stomach and small intestine. It is more common in people with diabetes mellitus.  Gastroesophageal reflux disease (GERD). This is a digestive condition that makes stomach acid flow back into the esophagus.  Urinary retention. This means that the body is holding onto urine, and the bladder cannot be emptied all the way. Follow these instructions at home: Eating and drinking  Avoid eating too much.  Try not to swallow air while talking or eating.  Avoid eating while lying down.  Avoid these foods and drinks: ? Foods that cause gas, such as broccoli, cabbage, cauliflower, and baked beans. ? Carbonated drinks. ? Hard candy. ? Chewing gum. Medicines  Take over-the-counter and prescription medicines only as told by your health care provider.  Take probiotic medicines. These medicines contain live bacteria or yeasts that can help digestion.  Take coated peppermint oil capsules. Activity  Try to exercise regularly. Exercise may help to relieve bloating that is caused by gas and relieve constipation. General instructions  Keep all follow-up visits as told by your health care provider. This is important. Contact a health care provider if:  You have nausea and vomiting.  You have diarrhea.  You have abdominal pain.  You have unusual  weight loss or weight gain.  You have severe pain, and medicines do not help. Get help right away if:  You have severe chest pain.  You have trouble breathing.  You have shortness of breath.  You have trouble urinating.  You have darker urine than normal.  You have blood in your stools or have dark, tarry stools. Summary  Abdominal bloating means that the abdomen is swollen.  Common causes of abdominal bloating are swallowing air, constipation, and problems digesting food.  Avoid eating too much and avoid swallowing air.  Avoid foods that cause gas, carbonated drinks, hard candy, and chewing gum. This information is not intended to replace advice given to you by your health care provider. Make sure you discuss any questions you have with your health care provider. Document Released: 09/15/2016 Document Revised: 12/02/2018 Document Reviewed: 09/15/2016 Elsevier Patient Education  2020 Elsevier Inc.  

## 2019-06-02 ENCOUNTER — Encounter: Payer: Self-pay | Admitting: Physician Assistant

## 2019-06-02 ENCOUNTER — Other Ambulatory Visit: Payer: Self-pay | Admitting: Physician Assistant

## 2019-06-02 DIAGNOSIS — E11621 Type 2 diabetes mellitus with foot ulcer: Secondary | ICD-10-CM

## 2019-06-02 DIAGNOSIS — L97509 Non-pressure chronic ulcer of other part of unspecified foot with unspecified severity: Secondary | ICD-10-CM

## 2019-06-02 DIAGNOSIS — K219 Gastro-esophageal reflux disease without esophagitis: Secondary | ICD-10-CM

## 2019-06-03 ENCOUNTER — Other Ambulatory Visit: Payer: Self-pay | Admitting: Physician Assistant

## 2019-06-03 ENCOUNTER — Ambulatory Visit: Payer: Federal, State, Local not specified - PPO

## 2019-06-03 DIAGNOSIS — K219 Gastro-esophageal reflux disease without esophagitis: Secondary | ICD-10-CM

## 2019-06-03 MED ORDER — DEXLANSOPRAZOLE 60 MG PO CPDR: 60.0000 mg | DELAYED_RELEASE_CAPSULE | Freq: Every day | ORAL | 1 refills | Status: DC

## 2019-06-03 NOTE — Telephone Encounter (Signed)
Can we leave her 2 more boxes of dexilant samples at the front if we have them

## 2019-06-04 ENCOUNTER — Telehealth: Payer: Self-pay

## 2019-06-04 ENCOUNTER — Other Ambulatory Visit: Payer: Self-pay

## 2019-06-04 ENCOUNTER — Ambulatory Visit
Admission: RE | Admit: 2019-06-04 | Discharge: 2019-06-04 | Disposition: A | Discharge status: Home / Self Care | Payer: Medicare Other | Source: Ambulatory Visit | Attending: Physician Assistant | Admitting: Physician Assistant

## 2019-06-04 DIAGNOSIS — R140 Abdominal distension (gaseous): Secondary | ICD-10-CM | POA: Insufficient documentation

## 2019-06-04 DIAGNOSIS — R142 Eructation: Secondary | ICD-10-CM | POA: Diagnosis present

## 2019-06-04 NOTE — Telephone Encounter (Signed)
-----   Message from Mar Daring, [place] sent at 06/04/2019  2:00 PM EDT ----- Gallbladder is normal. Fatty liver noted.

## 2019-06-04 NOTE — Telephone Encounter (Signed)
Viewed by [name] on 06/04/2019 3:04 PM Written by Mar Daring, PA-C on 06/04/2019 2:00 PM Gallbladder is normal. Fatty liver noted.

## 2019-06-09 ENCOUNTER — Telehealth: Payer: Self-pay | Admitting: *Deleted

## 2019-06-09 NOTE — Telephone Encounter (Signed)
Limiting fatty foods in diet, limit red meats and sugars. Increasing exercise as tolerated. Can add a medicine called spironolactone if desired that helps with fat metabolism with fatty liver. Can also refer to GI if patient wants further evaluation.

## 2019-06-09 NOTE — Telephone Encounter (Signed)
Patient was calling office concerning US abdomen results from 06/04/2019. Patient is requesting further advise concerning fatty liver. Please advise?

## 2019-06-09 NOTE — Telephone Encounter (Signed)
Patient advised as directed below. She is going to think about the medication/referral and call us back.

## 2019-06-24 ENCOUNTER — Other Ambulatory Visit: Payer: Self-pay | Admitting: Physician Assistant

## 2019-06-24 DIAGNOSIS — Z1231 Encounter for screening mammogram for malignant neoplasm of breast: Secondary | ICD-10-CM

## 2019-07-09 NOTE — Progress Notes (Signed)
6.8      Patient: Stacie Porter Female    DOB: 02/04/1946   73 y.o.   MRN: [id number] Visit Date: 07/10/2019  Today's Provider: Mar Daring, PA-C   Chief Complaint  Patient presents with  . Follow-up    T2DM   Subjective:     HPI   Diabetes Mellitus Type II, Follow-up:   Lab Results  Component Value Date   HGBA1C 6.8 (A) 07/10/2019   HGBA1C 7.1 (A) 04/09/2019   HGBA1C 7.0 (A) 01/06/2019    Last seen for diabetes 3 months ago.  Management since then includes A1c essentially stable at 7.1. Continue Farxiga 5mg  daily. On Lisinopril 40mg  She reports excellent compliance with treatment. She is not having side effects.  Current symptoms include none and have been stable. Home blood sugar records: fasting range: 150  Episodes of hypoglycemia? no   Current insulin regiment: Is not on insulin Most Recent Eye Exam: UTD Weight trend: stable Current exercise: walking Current diet habits: well balanced  Pertinent Labs:    Component Value Date/Time   CHOL 152 09/23/2018 0000   TRIG 162 (H) 09/23/2018 0000   HDL 38 (L) 09/23/2018 0000   LDLCALC 82 09/23/2018 0000   CREATININE 0.99 11/07/2018 1029   CREATININE 0.65 12/03/2014 0054    Wt Readings from Last 3 Encounters:  07/10/19 228 lb 12.8 oz (103.8 kg)  05/21/19 228 lb (103.4 kg)  04/09/19 228 lb (103.4 kg)    ------------------------------------------------------------------------  Hypertension, follow-up:  BP Readings from Last 3 Encounters:  07/10/19 (!) 146/75  05/21/19 136/67  04/09/19 134/71    She was last seen for hypertension 6 months ago.  BP at that visit was 152/76 Management since that visit includes none. She reports excellent compliance with treatment. She is not having side effects.  She is exercising. She is adherent to low salt diet.   Outside blood pressures are 130's/67's She is experiencing none.  Patient denies chest pain, chest pressure/discomfort, fatigue, irregular heart  beat, lower extremity edema and near-syncope.   Cardiovascular risk factors include advanced age (older than 65 for men, 35 for women), diabetes mellitus, hypertension and obesity (BMI >= 30 kg/m2).      Weight trend: stable Wt Readings from Last 3 Encounters:  07/10/19 228 lb 12.8 oz (103.8 kg)  05/21/19 228 lb (103.4 kg)  04/09/19 228 lb (103.4 kg)    Current diet: well balanced  ------------------------------------------------------------------------    Allergies  Allergen Reactions  . Ibuprofen     Elevates BP if taken continuously  . Meloxicam Swelling  . Multaq [Dronedarone]     Unknown reaction      Current Outpatient Medications:  .  aspirin 81 MG EC tablet, TAKE 1 TABLET BY MOUTH EVERY DAY (Patient taking differently: Take 81 mg by mouth at bedtime. ), Disp: 90 tablet, Rfl: 3 .  Calcium Carbonate-Vitamin D 600-200 MG-UNIT CAPS, Take 1 tablet by mouth daily., Disp: , Rfl:  .  dexlansoprazole (DEXILANT) 60 MG capsule, Take 1 capsule (60 mg total) by mouth daily., Disp: 90 capsule, Rfl: 1 .  FARXIGA 5 MG TABS tablet, TAKE 1 TABLET BY MOUTH EVERY DAY, Disp: 30 tablet, Rfl: 3 .  Glucosamine HCl-MSM (GLUCOSAMINE-MSM PO), Take 1 tablet by mouth at bedtime., Disp: , Rfl:  .  metoprolol tartrate (LOPRESSOR) 25 MG tablet, Take 25 mg by mouth 2 (two) times daily. , Disp: , Rfl:  .  MULTIPLE VITAMIN PO, Take 1 tablet by mouth daily., Disp: ,  Rfl:  .  Omega-3 Fatty Acids (FISH OIL) 1200 MG CAPS, Take 1,200 mg by mouth daily., Disp: , Rfl:  .  omeprazole (PRILOSEC) 20 MG capsule, Take 1 capsule (20 mg total) by mouth 2 (two) times daily before a meal., Disp: 180 capsule, Rfl: 1 .  simvastatin (ZOCOR) 20 MG tablet, TAKE 1 TABLET BY MOUTH DAILY, Disp: 90 tablet, Rfl: 2 .  hydrochlorothiazide (HYDRODIURIL) 25 MG tablet, Take 25 mg by mouth daily. , Disp: , Rfl:  .  lisinopril (PRINIVIL,ZESTRIL) 40 MG tablet, Take 40 mg by mouth daily. , Disp: , Rfl:   Review of Systems   Constitutional: Negative for appetite change, chills, fatigue and fever.  Respiratory: Negative for chest tightness and shortness of breath.   Cardiovascular: Negative for chest pain and palpitations.  Gastrointestinal: Negative for abdominal pain, nausea and vomiting.  Endocrine: Negative for polydipsia, polyphagia and polyuria.  Neurological: Negative for dizziness and weakness.    Social History   Tobacco Use  . Smoking status: Never Smoker  . Smokeless tobacco: Never Used  Substance Use Topics  . Alcohol use: No      Objective:   BP (!) 146/75 (BP Location: Left Arm, Patient Position: Sitting, Cuff Size: Large) Comment: 134/67 at home this AM  Pulse (!) 58   Temp 97.6 F (36.4 C) (Temporal)   Resp 16   Wt 228 lb 12.8 oz (103.8 kg)   BMI 38.07 kg/m  Vitals:   07/10/19 0813  BP: (!) 146/75  Pulse: (!) 58  Resp: 16  Temp: 97.6 F (36.4 C)  TempSrc: Temporal  Weight: 228 lb 12.8 oz (103.8 kg)  Body mass index is 38.07 kg/m.   Physical Exam Vitals signs reviewed.  Constitutional:      General: She is not in acute distress.    Appearance: Normal appearance. She is well-developed. She is obese. She is not ill-appearing or diaphoretic.  Neck:     Musculoskeletal: Normal range of motion and neck supple.  Cardiovascular:     Rate and Rhythm: Normal rate and regular rhythm.     Heart sounds: Normal heart sounds. No murmur. No friction rub. No gallop.   Pulmonary:     Effort: Pulmonary effort is normal. No respiratory distress.     Breath sounds: Normal breath sounds. No wheezing or rales.  Neurological:     Mental Status: She is alert.      Results for orders placed or performed in visit on 07/10/19  POCT HgB A1C  Result Value Ref Range   Hemoglobin A1C 6.8 (A) 4.0 - 5.6 %   Est. average glucose Bld gHb Est-mCnc 148        Assessment & Plan    1. Type 2 diabetes mellitus with foot ulcer, without long-term current use of insulin (HCC) A1c improved to 6.8  from 7.1. Microalbumin is negative. Continue Farxiga 5mg  and lisinopril 40mg . I will see her back in 3 months for her CPE/AWV.      Mar Daring, PA-C  [facility]

## 2019-07-10 ENCOUNTER — Other Ambulatory Visit: Payer: Self-pay

## 2019-07-10 ENCOUNTER — Ambulatory Visit (INDEPENDENT_AMBULATORY_CARE_PROVIDER_SITE_OTHER): Payer: Medicare Other | Admitting: Physician Assistant

## 2019-07-10 ENCOUNTER — Encounter: Payer: Self-pay | Admitting: Physician Assistant

## 2019-07-10 VITALS — BP 146/75 | HR 58 | Temp 97.6°F | Resp 16 | Wt 228.8 lb

## 2019-07-10 DIAGNOSIS — E11621 Type 2 diabetes mellitus with foot ulcer: Secondary | ICD-10-CM | POA: Diagnosis not present

## 2019-07-10 DIAGNOSIS — L97509 Non-pressure chronic ulcer of other part of unspecified foot with unspecified severity: Secondary | ICD-10-CM | POA: Diagnosis not present

## 2019-07-10 LAB — POCT GLYCOSYLATED HEMOGLOBIN (HGB A1C)
Est. average glucose Bld gHb Est-mCnc: 148
Hemoglobin A1C: 6.8 % — AB (ref 4.0–5.6)

## 2019-07-10 LAB — POCT UA - MICROALBUMIN: Microalbumin Ur, POC: NEGATIVE mg/L

## 2019-07-10 NOTE — Patient Instructions (Signed)

## 2019-07-18 ENCOUNTER — Encounter: Payer: Self-pay | Admitting: Physician Assistant

## 2019-07-18 DIAGNOSIS — L97509 Non-pressure chronic ulcer of other part of unspecified foot with unspecified severity: Secondary | ICD-10-CM

## 2019-07-18 DIAGNOSIS — K760 Fatty (change of) liver, not elsewhere classified: Secondary | ICD-10-CM | POA: Insufficient documentation

## 2019-07-18 DIAGNOSIS — E11621 Type 2 diabetes mellitus with foot ulcer: Secondary | ICD-10-CM

## 2019-08-08 ENCOUNTER — Ambulatory Visit
Admission: RE | Admit: 2019-08-08 | Discharge: 2019-08-08 | Disposition: A | Discharge status: Home / Self Care | Payer: Medicare Other | Source: Ambulatory Visit | Attending: Physician Assistant | Admitting: Physician Assistant

## 2019-08-08 DIAGNOSIS — Z1231 Encounter for screening mammogram for malignant neoplasm of breast: Secondary | ICD-10-CM | POA: Insufficient documentation

## 2019-08-12 ENCOUNTER — Encounter: Payer: Medicare Other | Attending: Physician Assistant | Admitting: Dietician

## 2019-08-12 ENCOUNTER — Other Ambulatory Visit: Payer: Self-pay

## 2019-08-12 ENCOUNTER — Encounter: Payer: Self-pay | Admitting: Dietician

## 2019-08-12 VITALS — Ht 65.0 in | Wt 227.7 lb

## 2019-08-12 DIAGNOSIS — G4730 Sleep apnea, unspecified: Secondary | ICD-10-CM | POA: Insufficient documentation

## 2019-08-12 DIAGNOSIS — Z6837 Body mass index (BMI) 37.0-37.9, adult: Secondary | ICD-10-CM | POA: Insufficient documentation

## 2019-08-12 DIAGNOSIS — Z713 Dietary counseling and surveillance: Secondary | ICD-10-CM | POA: Insufficient documentation

## 2019-08-12 DIAGNOSIS — I10 Essential (primary) hypertension: Secondary | ICD-10-CM | POA: Insufficient documentation

## 2019-08-12 DIAGNOSIS — K760 Fatty (change of) liver, not elsewhere classified: Secondary | ICD-10-CM | POA: Insufficient documentation

## 2019-08-12 DIAGNOSIS — E11621 Type 2 diabetes mellitus with foot ulcer: Secondary | ICD-10-CM | POA: Diagnosis not present

## 2019-08-12 NOTE — Progress Notes (Signed)
Medical Nutrition Therapy: Visit start time: 1030  end time: 1120  Assessment:  Diagnosis: Type 2 DM, fatty liver Past medical history: HTN, HLD, GERD, sleep apnea Psychosocial issues/ stress concerns: none  Preferred learning method:  . Auditory  Current weight: 227.7lbs Height: 5'5" Medications, supplements: reconciled list in medical record  Progress and evaluation:   Patient reports diagnosis of Type 2 DM 2./2020, met with RN for 2 visits. She reports recent HbA1C of 6.8%  Was having GI distress, reduced food portions and stopped snacking after dinner, and symptoms have improved.  Participated in weight watchers for 3 years until in-person offices closed.    Physical activity: walking or video 30 minutes 5 times a week  Dietary Intake:  Usual eating pattern includes 3 meals and 0-2 snacks per day. Dining out frequency: 2-3 meals per week.  Breakfast: 2slices turkey bacon, homemade low-carb bagel with peanut butter + fruit, cheese stick, avoids milk due to GI distress; occ out eggs and bacon Snack: same as pm or none Lunch: sandwich with deli meat, honey wheat bread + chips; occasionally leftovers Snack: nabs cracker or ritz crackers and cheese stick; occ something sweet ie cookie or cake Supper: varies-- chicken, fried rice with riced cauliflower, green beans; occ burger limited fries/ onion rings; meat loaf; chicken pie; grilled chicken; country style steak with potato small portion Snack: none Beverages: water, unsweet tea with splenda, occ diet cranberry juice drink  Nutrition Care Education: Topics covered:  Basic nutrition: basic food groups, appropriate nutrient balance, appropriate meal and snack schedule    Weight control: importance of low sugar and low fat choices, portion control, estimated energy needs for weight loss at 1400kcal, provided guidance for 45%CHO, 25%protein, and 30% fat Diabetes:  appropriate carb intake and balance, healthy carb choices Fatty  liver: controlling carb intake and limiting processed starches and sweets, including whole grain options in small-moderate amounts; choosing low fat foods; weight loss   Nutritional Diagnosis:  [place]-2.2 Altered nutrition-related laboratory As related to Type 2 diabetes.  As evidenced by recent HbA1C of 6.8%. [place]-1.4 Altered GI function As related to fatty liver.  As evidenced by GI distress, recent scan. [place]-3.3 Overweight/obesity As related to history of excess calories and inadequate physical activity.  As evidenced by patient with current BMI of 37.7.  Intervention:   Instruction and discussion as noted above.  Patient has been working on positive dietary and lifestyle changes in recent months, and is motivated to continue.   Patient has goal to continue reducing processed carb intake by reducing consumption of potato chips.   Recommended Skinny Liver book by Kristin Kirkpatrick, MS, RD  No follow-up scheduled at this time; patient will schedule at a later time if needed.   Education Materials given:  . Food lists/ Planning A Balanced Meal, sample meal pattern . Sample menus . Goals/ instructions   Learner/ who was taught:  . Patient   Level of understanding: . Verbalizes/ demonstrates competency   Demonstrated degree of understanding via:   Teach back Learning barriers: . None  Willingness to learn/ readiness for change: . Eager, change in progress  Monitoring and Evaluation:  Dietary intake, exercise, BG control and GI health, and body weight      follow up: prn  

## 2019-08-12 NOTE — Patient Instructions (Signed)
   Keep working to reduce intake of processed starches such as chips. Try a small portion (10 chips) of sun chips, or 3cups popcorn, or use a crunchy vegetable such as carrots, celery or sugar snap peas.   Great job controlling sugar and including regular exercise, keep up the good work!

## 2019-09-02 ENCOUNTER — Other Ambulatory Visit: Payer: Self-pay | Admitting: Physician Assistant

## 2019-09-05 ENCOUNTER — Encounter: Payer: Self-pay | Admitting: Physician Assistant

## 2019-09-18 ENCOUNTER — Encounter: Payer: Self-pay | Admitting: Physician Assistant

## 2019-09-18 NOTE — Telephone Encounter (Signed)
You can see my note back to her that we can do it if it is getting covered. I think she still has a [employer] data [employer] and that is why hers is still covered.

## 2019-09-20 ENCOUNTER — Other Ambulatory Visit: Payer: Self-pay | Admitting: Physician Assistant

## 2019-09-20 DIAGNOSIS — L97509 Non-pressure chronic ulcer of other part of unspecified foot with unspecified severity: Secondary | ICD-10-CM

## 2019-09-20 DIAGNOSIS — E11621 Type 2 diabetes mellitus with foot ulcer: Secondary | ICD-10-CM

## 2019-09-23 ENCOUNTER — Telehealth: Payer: Self-pay

## 2019-09-23 DIAGNOSIS — E782 Mixed hyperlipidemia: Secondary | ICD-10-CM

## 2019-09-23 DIAGNOSIS — K760 Fatty (change of) liver, not elsewhere classified: Secondary | ICD-10-CM

## 2019-09-23 DIAGNOSIS — D473 Essential (hemorrhagic) thrombocythemia: Secondary | ICD-10-CM

## 2019-09-23 DIAGNOSIS — I10 Essential (primary) hypertension: Secondary | ICD-10-CM

## 2019-09-23 DIAGNOSIS — L97509 Non-pressure chronic ulcer of other part of unspecified foot with unspecified severity: Secondary | ICD-10-CM

## 2019-09-23 DIAGNOSIS — E11621 Type 2 diabetes mellitus with foot ulcer: Secondary | ICD-10-CM

## 2019-09-23 NOTE — Telephone Encounter (Signed)
Copied from [place]. Topic: General - Other >> Sep 23, 2019  9:05 AM Stacie Porter wrote: Reason for CRM: Pt called and is requesting to have her blood work done before her CPE on 10/15/19. Pt is requesting to have orders placed so she can schedule appt. Please advise.

## 2019-09-24 NOTE — Telephone Encounter (Signed)
Labs ordered.

## 2019-09-24 NOTE — Addendum Note (Signed)
Addended by: Mar Daring on: 09/24/2019 02:31 PM   Modules accepted: Orders

## 2019-09-24 NOTE — Telephone Encounter (Signed)
Left message advising pt.   Thanks,   -Cynai Skeens  

## 2019-10-13 ENCOUNTER — Encounter: Payer: Medicare Other | Admitting: Physician Assistant

## 2019-10-15 ENCOUNTER — Encounter: Payer: Medicare Other | Admitting: Physician Assistant

## 2019-10-15 ENCOUNTER — Ambulatory Visit: Payer: Medicare Other

## 2019-10-15 ENCOUNTER — Encounter: Payer: Self-pay | Admitting: Physician Assistant

## 2019-10-15 ENCOUNTER — Telehealth: Payer: Self-pay

## 2019-10-15 DIAGNOSIS — B379 Candidiasis, unspecified: Secondary | ICD-10-CM

## 2019-10-15 MED ORDER — FLUCONAZOLE 150 MG PO TABS: 150.0000 mg | ORAL_TABLET | Freq: Once | ORAL | 0 refills | Status: AC

## 2019-10-15 NOTE — Telephone Encounter (Signed)
Left message advising patient that prescription was sent to pharmacy 

## 2019-10-22 ENCOUNTER — Other Ambulatory Visit: Payer: Self-pay | Admitting: Physician Assistant

## 2019-10-22 DIAGNOSIS — K2100 Gastro-esophageal reflux disease with esophagitis, without bleeding: Secondary | ICD-10-CM

## 2019-11-01 ENCOUNTER — Other Ambulatory Visit: Payer: Self-pay | Admitting: Physician Assistant

## 2019-11-01 DIAGNOSIS — E785 Hyperlipidemia, unspecified: Secondary | ICD-10-CM

## 2019-11-01 NOTE — Telephone Encounter (Signed)
Requested medication (s) are due for refill today: yes  Requested medication (s) are on the active medication list: yes  Last refill:  02/27/19  Future visit scheduled: no  Notes to clinic:  overdue for lab work   Requested Prescriptions  Pending Prescriptions Disp Refills   simvastatin ([place]) 20 MG tablet [Pharmacy Med Name: SIMVASTATIN 20 MG TABLET] 90 tablet 2    Sig: TAKE 1 TABLET BY MOUTH DAILY      Cardiovascular:  Antilipid - Statins Failed - 11/01/2019  8:39 AM      Failed - Total Cholesterol in normal range and within 360 days    Cholesterol, Total  Date Value Ref Range Status  09/23/2018 152 100 - 199 mg/dL Final          Failed - LDL in normal range and within 360 days    LDL Calculated  Date Value Ref Range Status  09/23/2018 82 0 - 99 mg/dL Final          Failed - HDL in normal range and within 360 days    HDL  Date Value Ref Range Status  09/23/2018 38 (L) >39 mg/dL Final          Failed - Triglycerides in normal range and within 360 days    Triglycerides  Date Value Ref Range Status  09/23/2018 162 (H) 0 - 149 mg/dL Final          Passed - Patient is not pregnant      Passed - Valid encounter within last 12 months    Recent Outpatient Visits           3 months ago Type 2 diabetes mellitus with foot ulcer, without long-term current use of insulin [facility])   [place], [place], PA-C   5 months ago Gastroesophageal reflux disease, esophagitis presence not specified   [place], [place], PA-C   6 months ago Type 2 diabetes mellitus with foot ulcer, without long-term current use of insulin [facility])   [place], [place], [place]   9 months ago Type 2 diabetes mellitus with foot ulcer, without long-term current use of insulin [facility])   [place], [place], [place]   1 year ago Type 2 diabetes mellitus with foot ulcer, without long-term current use of  insulin [facility])   [place], [place], [place]

## 2019-11-03 ENCOUNTER — Telehealth: Payer: Self-pay

## 2019-11-03 NOTE — Telephone Encounter (Signed)
Called pt to schedule an AWV prior to her CPE on 11/17/18. Pt requested a CB at the end of the week to schedule the AWV. Note made to [facility] Thursday, 11/06/19.

## 2019-11-05 LAB — CBC WITH DIFFERENTIAL/PLATELET
Basophils Absolute: 0.1 10*3/uL (ref 0.0–0.2)
Basos: 1 %
EOS (ABSOLUTE): 0.1 10*3/uL (ref 0.0–0.4)
Eos: 1 %
Hematocrit: 38.4 % (ref 34.0–46.6)
Hemoglobin: 13.2 g/dL (ref 11.1–15.9)
Immature Grans (Abs): 0 10*3/uL (ref 0.0–0.1)
Immature Granulocytes: 0 %
Lymphocytes Absolute: 1.6 10*3/uL (ref 0.7–3.1)
Lymphs: 28 %
MCH: 33 pg (ref 26.6–33.0)
MCHC: 34.4 g/dL (ref 31.5–35.7)
MCV: 96 fL (ref 79–97)
Monocytes Absolute: 0.6 10*3/uL (ref 0.1–0.9)
Monocytes: 11 %
Neutrophils Absolute: 3.3 10*3/uL (ref 1.4–7.0)
Neutrophils: 59 %
Platelets: 118 10*3/uL — ABNORMAL LOW (ref 150–450)
RBC: 4 x10E6/uL (ref 3.77–5.28)
RDW: 12.8 % (ref 11.7–15.4)
WBC: 5.7 10*3/uL (ref 3.4–10.8)

## 2019-11-05 LAB — COMPREHENSIVE METABOLIC PANEL
ALT: 20 IU/L (ref 0–32)
AST: 24 IU/L (ref 0–40)
Albumin/Globulin Ratio: 2.1 (ref 1.2–2.2)
Albumin: 4.2 g/dL (ref 3.7–4.7)
Alkaline Phosphatase: 63 IU/L (ref 39–117)
BUN/Creatinine Ratio: 26 (ref 12–28)
BUN: 21 mg/dL (ref 8–27)
Bilirubin Total: 0.9 mg/dL (ref 0.0–1.2)
CO2: 24 mmol/L (ref 20–29)
Calcium: 9.7 mg/dL (ref 8.7–10.3)
Chloride: 100 mmol/L (ref 96–106)
Creatinine, Ser: 0.82 mg/dL (ref 0.57–1.00)
GFR calc Af Amer: 82 mL/min/{1.73_m2} (ref >59)
GFR calc non Af Amer: 71 mL/min/{1.73_m2} (ref >59)
Globulin, Total: 2 g/dL (ref 1.5–4.5)
Glucose: 140 mg/dL — ABNORMAL HIGH (ref 65–99)
Potassium: 4.2 mmol/L (ref 3.5–5.2)
Sodium: 139 mmol/L (ref 134–144)
Total Protein: 6.2 g/dL (ref 6.0–8.5)

## 2019-11-05 LAB — LIPID PANEL WITH LDL/HDL RATIO
Cholesterol, Total: 168 mg/dL (ref 100–199)
HDL: 39 mg/dL — ABNORMAL LOW (ref >39)
LDL Chol Calc (NIH): 93 mg/dL (ref 0–99)
LDL/HDL Ratio: 2.4 ratio (ref 0.0–3.2)
Triglycerides: 212 mg/dL — ABNORMAL HIGH (ref 0–149)
VLDL Cholesterol Cal: 36 mg/dL (ref 5–40)

## 2019-11-05 LAB — HEMOGLOBIN A1C
Est. average glucose Bld gHb Est-mCnc: 148 mg/dL
Hgb A1c MFr Bld: 6.8 % — ABNORMAL HIGH (ref 4.8–5.6)

## 2019-11-12 NOTE — Progress Notes (Signed)
Subjective:   Stacie Porter is a 74 y.o. female who presents for Medicare Annual (Subsequent) preventive examination.    This visit is being conducted through telemedicine due to the COVID-19 pandemic. This patient has given me verbal consent via doximity to conduct this visit, patient states they are participating from their home address. Some vital signs may be absent or patient reported.    Patient identification: identified by name, DOB, and current address  Review of Systems:  N/A  Cardiac Risk Factors include: advanced age (>3men, >78 women);diabetes mellitus;hypertension     Objective:     Vitals: There were no vitals taken for this visit.  There is no height or weight on file to calculate BMI. Unable to obtain vitals due to visit being conducted via telephonically.   Advanced Directives 11/13/2019 08/12/2019 11/07/2018 11/04/2018 10/04/2018 09/28/2016 05/19/2016  Does Patient Have a Medical Advance Directive? Yes Yes Yes Yes Yes Yes No  Type of Paramedic of [place];Living will [place];Living will [place];Living will [place];Living will [place];Living will - -  Does patient want to make changes to medical advance directive? - - - No - Patient declined - - -  Copy of [place] in Chart? No - copy requested No - copy requested No - copy requested - No - copy requested - -  Would patient like information on creating a medical advance directive? - - - - - - Yes - Scientist, clinical (histocompatibility and immunogenetics) given    Tobacco Social History   Tobacco Use  Smoking Status Never Smoker  Smokeless Tobacco Never Used     Counseling given: Not Answered   Clinical Intake:  Pre-visit preparation completed: Yes  Pain : No/denies pain Pain Score: 0-No pain     Nutritional Risks: None Diabetes: Yes  How often do you need to have someone help you when you read instructions,  pamphlets, or other written materials from your doctor or pharmacy?: 1 - Never   Diabetes:  Is the patient diabetic?  Yes  If diabetic, was a CBG obtained today?  No  Did the patient bring in their glucometer from home?  No  How often do you monitor your CBG's? Two-four times a week.   Financial Strains and Diabetes Management:  Are you having any financial strains with the device, your supplies or your medication? No .  Does the patient want to be seen by Chronic Care Management for management of their diabetes?  No  Would the patient like to be referred to a Nutritionist or for Diabetic Management?  No   Diabetic Exams:  Diabetic Eye Exam: Completed 04/01/19. Repeat yearly.  Diabetic Foot Exam: Completed 10/04/18. Pt has been advised about the importance in completing this exam. Note made to f/u on this at next in office apt.    Interpreter Needed?: No  Information entered by :: [facility], LPN  Past Medical History:  Diagnosis Date  . Diabetes mellitus without complication ([place])    type 2  . Dysrhythmia    a fib  . Hyperlipidemia   . Hypertension   . Sleep apnea    cpap   Past Surgical History:  Procedure Laterality Date  . AMPUTATION TOE Right 11/11/2018   Procedure: AMPUTATION RIGHT 2ND TOE;  Surgeon: Dorna Leitz, MD;  Location: [facility];  Service: Orthopedics;  Laterality: Right;  . APPENDECTOMY    . CARDIAC ELECTROPHYSIOLOGY STUDY AND ABLATION    .  cather ablation  2011/2014   cardiac ablation for a fib  . EYE SURGERY Bilateral 05/2014   cataract  . HAMMER TOE SURGERY Right   . HAND SURGERY Bilateral   . LAPAROSCOPIC OOPHERECTOMY    . recession of gums sx    . TUBAL LIGATION     Family History  Problem Relation Age of Onset  . Ovarian cancer Sister   . Cancer Sister        ovarian  . Lung cancer Sister   . Cancer Sister        lung and thyroid  . Thyroid cancer Sister   . Heart disease Mother   . Heart disease Father   . Hypertension Father   .  Diabetes Maternal Uncle   . Diabetes Maternal Uncle   . Breast cancer Neg Hx    Social History   Socioeconomic History  . Marital status: Married    Spouse name: Not on file  . Number of children: 3  . Years of education: Not on file  . Highest education level: Some college, no degree  Occupational History  . Occupation: retired    Comment: part time subsitute   Tobacco Use  . Smoking status: Never Smoker  . Smokeless tobacco: Never Used  Substance and Sexual Activity  . Alcohol use: No  . Drug use: No  . Sexual activity: Yes  Other Topics Concern  . Not on file  Social History Narrative  . Not on file   Social Determinants of Health   Financial Resource Strain: Low Risk   . Difficulty of Paying Living Expenses: Not hard at all  Food Insecurity: No Food Insecurity  . Worried About Charity fundraiser in the Last Year: Never true  . Ran Out of Food in the Last Year: Never true  Transportation Needs: No Transportation Needs  . Lack of Transportation (Medical): No  . Lack of Transportation (Non-Medical): No  Physical Activity: Insufficiently Active  . Days of Exercise per Week: 4 days  . Minutes of Exercise per Session: 30 min  Stress: No Stress Concern Present  . Feeling of Stress : Not at all  Social Connections: Not Isolated  . Frequency of Communication with Friends and Family: More than three times a week  . Frequency of Social Gatherings with Friends and Family: More than three times a week  . Attends Religious Services: More than 4 times per year  . Active Member of Clubs or Organizations: Yes  . Attends Archivist Meetings: More than 4 times per year  . Marital Status: Married    Outpatient Encounter Medications as of 11/13/2019  Medication Sig  . ASPIRIN LOW DOSE 81 MG EC tablet TAKE 1 TABLET BY MOUTH EVERY DAY  . Calcium Carbonate-Vitamin D 600-200 MG-UNIT CAPS Take 1 tablet by mouth daily.  Marland Kitchen FARXIGA 5 MG TABS tablet TAKE 1 TABLET BY MOUTH EVERY  DAY  . Glucosamine HCl-MSM (GLUCOSAMINE-MSM PO) Take 1 tablet by mouth at bedtime.  . hydrochlorothiazide (HYDRODIURIL) 25 MG tablet Take 25 mg by mouth daily.  Marland Kitchen lisinopril (PRINIVIL,ZESTRIL) 40 MG tablet Take 40 mg by mouth daily.   . metoprolol tartrate (LOPRESSOR) 25 MG tablet Take 12.5 mg by mouth 2 (two) times daily.   . MULTIPLE VITAMIN PO Take 1 tablet by mouth daily.  . Omega-3 Fatty Acids (FISH OIL) 1200 MG CAPS Take 1,200 mg by mouth daily.  Marland Kitchen omeprazole (PRILOSEC) 20 MG capsule TAKE 1 CAPSULE (20 MG TOTAL)  BY MOUTH 2 (TWO) TIMES DAILY BEFORE A MEAL.  . simvastatin (ZOCOR) 20 MG tablet TAKE 1 TABLET BY MOUTH DAILY  . zinc gluconate 50 MG tablet Take 50 mg by mouth daily.  . hydrochlorothiazide (HYDRODIURIL) 25 MG tablet Take 25 mg by mouth daily.   Marland Kitchen lisinopril (ZESTRIL) 10 MG tablet Take 10 mg by mouth daily.   No facility-administered encounter medications on file as of 11/13/2019.    Activities of Daily Living In your present state of health, do you have any difficulty performing the following activities: 11/13/2019  Hearing? N  Vision? N  Difficulty concentrating or making decisions? N  Walking or climbing stairs? N  Dressing or bathing? N  Doing errands, shopping? N  Preparing Food and eating ? N  Using the Toilet? N  In the past six months, have you accidently leaked urine? Y  Comment Occasionally  Do you have problems with loss of bowel control? N  Managing your Medications? N  Managing your Finances? N  Housekeeping or managing your Housekeeping? N  Some recent data might be hidden    Patient Care Team: Mar Daring, PA-C as PCP - General (Family Medicine) Lorelee Cover., MD (Ophthalmology) Corey Skains, MD as Consulting Physician (Cardiology) Ralene Bathe, MD (Dermatology) Samara Deist, DPM as Referring Physician (Podiatry)    Assessment:   This is a routine wellness examination for Suszanne.  Exercise Activities and Dietary  recommendations Current Exercise Habits: Home exercise routine, Type of exercise: stretching;walking;strength training/weights(exercise tape), Time (Minutes): 30, Frequency (Times/Week): 4, Weekly Exercise (Minutes/Week): 120, Intensity: Mild, Exercise limited by: None identified  Goals    . DIET - REDUCE CALORIE INTAKE     Continue current diabetic diet changes to help aid with diabetes and weight loss.        Fall Risk: Fall Risk  11/13/2019 08/12/2019 11/04/2018 10/04/2018 10/01/2017  Falls in the past year? 0 0 0 1 No  Number falls in past yr: 0 - - 1 -  Injury with Fall? 0 - - - -    FALL RISK PREVENTION PERTAINING TO THE HOME:  Any stairs in or around the home? No  If so, are there any without handrails? N/A  Home free of loose throw rugs in walkways, pet beds, electrical cords, etc? Yes  Adequate lighting in your home to reduce risk of falls? Yes   ASSISTIVE DEVICES UTILIZED TO PREVENT FALLS:  Life alert? No  Use of a cane, walker or w/c? No  Grab bars in the bathroom? Yes  Shower chair or bench in shower? Yes  Elevated toilet seat or a handicapped toilet? Yes   TIMED UP AND GO:  Was the test performed? No .    Depression Screen PHQ 2/9 Scores 08/12/2019 11/04/2018 10/04/2018 10/04/2018  PHQ - 2 Score 0 0 0 0  PHQ- 9 Score - - 0 -     Cognitive Function        Immunization History  Administered Date(s) Administered  . Influenza Split 06/23/2006, 05/29/2009, 06/15/2010  . Influenza, High Dose Seasonal PF 06/24/2014, 06/22/2017  . Influenza,inj,Quad PF,6+ Mos 06/29/2015  . Influenza-Unspecified 05/01/2018, 05/05/2019  . Pneumococcal Conjugate-13 09/23/2014  . Pneumococcal Polysaccharide-23 07/01/2012  . Td 08/28/1997, 03/20/2008  . Tdap 09/23/2014    Qualifies for Shingles Vaccine? Yes . Due for Shingrix. Pt has been advised to call insurance company to determine out of pocket expense. Advised may also receive vaccine at local pharmacy or Health Dept. Verbalized  acceptance and understanding.  Tdap: Up to date  Flu Vaccine: Up to date  Pneumococcal Vaccine: Completed series  Screening Tests Health Maintenance  Topic Date Due  . FOOT EXAM  10/05/2019  . HEMOGLOBIN A1C  05/06/2020  . OPHTHALMOLOGY EXAM  09/29/2020  . COLONOSCOPY  10/11/2020  . MAMMOGRAM  08/07/2021  . DEXA SCAN  07/04/2022  . TETANUS/TDAP  09/23/2024  . INFLUENZA VACCINE  Completed  . Hepatitis C Screening  Completed  . PNA vac Low Risk Adult  Completed    Cancer Screenings:  Colorectal Screening: Completed 10/11/10. Repeat every 10 years.   Mammogram: Completed 10/08/18. Repeat every 1-2 years as advised.   Bone Density: Completed 07/04/12. Results reflect NORMAL. Repeat every 10 years.   Lung Cancer Screening: (Low Dose CT Chest recommended if Age 61-80 years, 30 pack-year currently smoking OR have quit w/in 15years.) does not qualify.   Additional Screening:  Hepatitis C Screening: Up to date Dental Screening: Recommended annual dental exams for proper oral hygiene   Community Resource Referral:  CRR required this visit?  No       Plan:  I have personally reviewed and addressed the Medicare Annual Wellness questionnaire and have noted the following in the patient's chart:  A. Medical and social history B. Use of alcohol, tobacco or illicit drugs  C. Current medications and supplements D. Functional ability and status E.  Nutritional status F.  Physical activity G. Advance directives H. List of other physicians I.  Hospitalizations, surgeries, and ER visits in previous 12 months J.  [place] such as hearing and vision if needed, cognitive and depression L. Referrals and appointments   In addition, I have reviewed and discussed with patient certain preventive protocols, quality metrics, and best practice recommendations. A written personalized care plan for preventive services as well as general preventive health recommendations were provided  to patient.   [place]endora Score, [place]  [id number] Nurse Health Advisor   Nurse Notes: Pt needs a diabetic foot exam at next in office apt.

## 2019-11-12 NOTE — Telephone Encounter (Signed)
Scheduled a telephonic AWV for tomorrow, 11/13/19 @ 1:20 PM.

## 2019-11-13 ENCOUNTER — Other Ambulatory Visit: Payer: Self-pay

## 2019-11-13 ENCOUNTER — Ambulatory Visit (INDEPENDENT_AMBULATORY_CARE_PROVIDER_SITE_OTHER): Payer: Medicare Other

## 2019-11-13 DIAGNOSIS — Z0000 Encounter for general adult medical examination without abnormal findings: Secondary | ICD-10-CM | POA: Diagnosis not present

## 2019-11-13 NOTE — Patient Instructions (Signed)
Stacie Porter , Thank you for taking time to come for your Medicare Wellness Visit. I appreciate your ongoing commitment to your health goals. Please review the following plan we discussed and let me know if I can assist you in the future.   Screening recommendations/referrals: Colonoscopy: Up to date, due 09/2020 Mammogram: Up to date, due 07/2021 Bone Density: Up to date, due 06/2022 Recommended yearly ophthalmology/optometry visit for glaucoma screening and checkup Recommended yearly dental visit for hygiene and checkup  Vaccinations: Influenza vaccine: Up to date Pneumococcal vaccine: Completed series Tdap vaccine: Up to date Shingles vaccine: Pt declines today.     Advanced directives: Please bring a copy of your POA (Power of Attorney) and/or Living Will to your next appointment.  Conditions/risks identified: Continue current diabetic diet changes to help aid with diabetes and weight loss.   Next appointment: 11/17/19 @ 8:20 AM with Fenton Malling. Declined scheduling an AWV for 2022 at this time.    Preventive Care 47 Years and Older, Female Preventive care refers to lifestyle choices and visits with your health care provider that can promote health and wellness. What does preventive care include?  A yearly physical exam. This is also called an annual well check.  Dental exams once or twice a year.  Routine eye exams. Ask your health care provider how often you should have your eyes checked.  Personal lifestyle choices, including:  Daily care of your teeth and gums.  Regular physical activity.  Eating a healthy diet.  Avoiding tobacco and drug use.  Limiting alcohol use.  Practicing safe sex.  Taking low-dose aspirin every day.  Taking vitamin and mineral supplements as recommended by your health care provider. What happens during an annual well check? The services and screenings done by your health care provider during your annual well check will depend on  your age, overall health, lifestyle risk factors, and family history of disease. Counseling  Your health care provider may ask you questions about your:  Alcohol use.  Tobacco use.  Drug use.  Emotional well-being.  Home and relationship well-being.  Sexual activity.  Eating habits.  History of falls.  Memory and ability to understand (cognition).  Work and work Statistician.  Reproductive health. Screening  You may have the following tests or measurements:  Height, weight, and BMI.  Blood pressure.  Lipid and cholesterol levels. These may be checked every 5 years, or more frequently if you are over 57 years old.  Skin check.  Lung cancer screening. You may have this screening every year starting at age 66 if you have a 30-pack-year history of smoking and currently smoke or have quit within the past 15 years.  Fecal occult blood test (FOBT) of the stool. You may have this test every year starting at age 95.  Flexible sigmoidoscopy or colonoscopy. You may have a sigmoidoscopy every 5 years or a colonoscopy every 10 years starting at age 26.  Hepatitis C blood test.  Hepatitis B blood test.  Sexually transmitted disease (STD) testing.  Diabetes screening. This is done by checking your blood sugar (glucose) after you have not eaten for a while (fasting). You may have this done every 1-3 years.  Bone density scan. This is done to screen for osteoporosis. You may have this done starting at age 63.  Mammogram. This may be done every 1-2 years. Talk to your health care provider about how often you should have regular mammograms. Talk with your health care provider about your test results,  treatment options, and if necessary, the need for more tests. Vaccines  Your health care provider may recommend certain vaccines, such as:  Influenza vaccine. This is recommended every year.  Tetanus, diphtheria, and acellular pertussis (Tdap, Td) vaccine. You may need a Td booster  every 10 years.  Zoster vaccine. You may need this after age 38.  Pneumococcal 13-valent conjugate (PCV13) vaccine. One dose is recommended after age 11.  Pneumococcal polysaccharide (PPSV23) vaccine. One dose is recommended after age 61. Talk to your health care provider about which screenings and vaccines you need and how often you need them. This information is not intended to replace advice given to you by your health care provider. Make sure you discuss any questions you have with your health care provider. Document Released: 09/10/2015 Document Revised: 05/03/2016 Document Reviewed: 06/15/2015 Elsevier Interactive Patient Education  2017 [place] Prevention in the Home Falls can cause injuries. They can happen to people of all ages. There are many things you can do to make your home safe and to help prevent falls. What can I do on the outside of my home?  Regularly fix the edges of walkways and driveways and fix any cracks.  Remove anything that might make you trip as you walk through a door, such as a raised step or threshold.  Trim any bushes or trees on the path to your home.  Use bright outdoor lighting.  Clear any walking paths of anything that might make someone trip, such as rocks or tools.  Regularly check to see if handrails are loose or broken. Make sure that both sides of any steps have handrails.  Any raised decks and porches should have guardrails on the edges.  Have any leaves, snow, or ice cleared regularly.  Use sand or salt on walking paths during winter.  Clean up any spills in your garage right away. This includes oil or grease spills. What can I do in the bathroom?  Use night lights.  Install grab bars by the toilet and in the tub and shower. Do not use towel bars as grab bars.  Use non-skid mats or decals in the tub or shower.  If you need to sit down in the shower, use a plastic, non-slip stool.  Keep the floor dry. Clean up any  water that spills on the floor as soon as it happens.  Remove soap buildup in the tub or shower regularly.  Attach bath mats securely with double-sided non-slip rug tape.  Do not have throw rugs and other things on the floor that can make you trip. What can I do in the bedroom?  Use night lights.  Make sure that you have a light by your bed that is easy to reach.  Do not use any sheets or blankets that are too big for your bed. They should not hang down onto the floor.  Have a firm chair that has side arms. You can use this for support while you get dressed.  Do not have throw rugs and other things on the floor that can make you trip. What can I do in the kitchen?  Clean up any spills right away.  Avoid walking on wet floors.  Keep items that you use a lot in easy-to-reach places.  If you need to reach something above you, use a strong step stool that has a grab bar.  Keep electrical cords out of the way.  Do not use floor polish or wax that makes floors  slippery. If you must use wax, use non-skid floor wax.  Do not have throw rugs and other things on the floor that can make you trip. What can I do with my stairs?  Do not leave any items on the stairs.  Make sure that there are handrails on both sides of the stairs and use them. Fix handrails that are broken or loose. Make sure that handrails are as long as the stairways.  Check any carpeting to make sure that it is firmly attached to the stairs. Fix any carpet that is loose or worn.  Avoid having throw rugs at the top or bottom of the stairs. If you do have throw rugs, attach them to the floor with carpet tape.  Make sure that you have a light switch at the top of the stairs and the bottom of the stairs. If you do not have them, ask someone to add them for you. What else can I do to help prevent falls?  Wear shoes that:  Do not have high heels.  Have rubber bottoms.  Are comfortable and fit you well.  Are closed  at the toe. Do not wear sandals.  If you use a stepladder:  Make sure that it is fully opened. Do not climb a closed stepladder.  Make sure that both sides of the stepladder are locked into place.  Ask someone to hold it for you, if possible.  Clearly mark and make sure that you can see:  Any grab bars or handrails.  First and last steps.  Where the edge of each step is.  Use tools that help you move around (mobility aids) if they are needed. These include:  Canes.  Walkers.  Scooters.  Crutches.  Turn on the lights when you go into a dark area. Replace any light bulbs as soon as they burn out.  Set up your furniture so you have a clear path. Avoid moving your furniture around.  If any of your floors are uneven, fix them.  If there are any pets around you, be aware of where they are.  Review your medicines with your doctor. Some medicines can make you feel dizzy. This can increase your chance of falling. Ask your doctor what other things that you can do to help prevent falls. This information is not intended to replace advice given to you by your health care provider. Make sure you discuss any questions you have with your health care provider. Document Released: 06/10/2009 Document Revised: 01/20/2016 Document Reviewed: 09/18/2014 Elsevier Interactive Patient Education  2017 [employer].

## 2019-11-14 NOTE — Progress Notes (Signed)
Patient: Stacie Porter Female    DOB: 12-Jul-1946   74 y.o.   MRN: [id number] Visit Date: 11/17/2019  Today's Provider: Mar Daring, PA-C   No chief complaint on file.  Subjective:      HPI   Diabetes Mellitus Type II, Follow-up:   Lab Results  Component Value Date   HGBA1C 6.8 (H) 11/04/2019   HGBA1C 6.8 (A) 07/10/2019   HGBA1C 7.1 (A) 04/09/2019   Last seen for diabetes 3 months ago.  Management since then includes continue Farxiga 5mg  and lisinopril 40mg .  She reports excellent compliance with treatment. She is not having side effects. She reports that she has been getting yeast infections from the [place]. Current symptoms include none and have been stable. Home blood sugar records: 140's-150's  Episodes of hypoglycemia? no   Current Insulin Regimen: n/a Most Recent Eye Exam: 09/30/2019 Current diet: in general, a "healthy" diet   Current exercise: Walking ------------------------------------------------------------------------   Allergies  Allergen Reactions  . Ibuprofen     Elevates BP if taken continuously  . Meloxicam Swelling  . Multaq [Dronedarone]     Unknown reaction      Current Outpatient Medications:  .  ASPIRIN LOW DOSE 81 MG EC tablet, TAKE 1 TABLET BY MOUTH EVERY DAY, Disp: 90 tablet, Rfl: 3 .  Calcium Carbonate-Vitamin D 600-200 MG-UNIT CAPS, Take 1 tablet by mouth daily., Disp: , Rfl:  .  FARXIGA 5 MG TABS tablet, TAKE 1 TABLET BY MOUTH EVERY DAY, Disp: 30 tablet, Rfl: 3 .  Glucosamine HCl-MSM (GLUCOSAMINE-MSM PO), Take 1 tablet by mouth at bedtime., Disp: , Rfl:  .  hydrochlorothiazide (HYDRODIURIL) 25 MG tablet, Take 25 mg by mouth daily., Disp: , Rfl:  .  lisinopril (PRINIVIL,ZESTRIL) 40 MG tablet, Take 40 mg by mouth daily. , Disp: , Rfl:  .  metoprolol tartrate (LOPRESSOR) 25 MG tablet, Take 12.5 mg by mouth 2 (two) times daily. , Disp: , Rfl:  .  MULTIPLE VITAMIN PO, Take 1 tablet by mouth daily., Disp: , Rfl:  .  Omega-3  Fatty Acids (FISH OIL) 1200 MG CAPS, Take 1,200 mg by mouth daily., Disp: , Rfl:  .  omeprazole (PRILOSEC) 20 MG capsule, TAKE 1 CAPSULE (20 MG TOTAL) BY MOUTH 2 (TWO) TIMES DAILY BEFORE A MEAL., Disp: 180 capsule, Rfl: 01 .  simvastatin (ZOCOR) 20 MG tablet, TAKE 1 TABLET BY MOUTH DAILY, Disp: 90 tablet, Rfl: 1 .  zinc gluconate 50 MG tablet, Take 50 mg by mouth daily., Disp: , Rfl:  .  hydrochlorothiazide (HYDRODIURIL) 25 MG tablet, Take 25 mg by mouth daily. , Disp: , Rfl:  .  lisinopril (ZESTRIL) 10 MG tablet, Take 10 mg by mouth daily., Disp: , Rfl:   Review of Systems  Constitutional: Negative.   HENT: Positive for postnasal drip.   Eyes: Negative.   Respiratory: Negative.   Cardiovascular: Negative.   Gastrointestinal: Negative.   Endocrine: Positive for polyuria.  Musculoskeletal: Negative.   Skin: Negative.   Allergic/Immunologic: Negative.   Neurological: Negative.   Hematological: Negative.   Psychiatric/Behavioral: Negative.     Social History   Tobacco Use  . Smoking status: Never Smoker  . Smokeless tobacco: Never Used  Substance Use Topics  . Alcohol use: No      Objective:   BP 140/70 (BP Location: Left Arm, Patient Position: Sitting, Cuff Size: Large)   Pulse (!) 57   Temp (!) 96.9 F (36.1 C) (Temporal)  Resp 16   Ht 5\' 5"  (1.651 m)   Wt 226 lb 12.8 oz (102.9 kg)   BMI 37.74 kg/m  Vitals:   11/17/19 0835  BP: 140/70  Pulse: (!) 57  Resp: 16  Temp: (!) 96.9 F (36.1 C)  TempSrc: Temporal  Weight: 226 lb 12.8 oz (102.9 kg)  Height: 5\' 5"  (1.651 m)  Body mass index is 37.74 kg/m.   Physical Exam Vitals reviewed.  Constitutional:      General: She is not in acute distress.    Appearance: Normal appearance. She is well-developed. She is obese. She is not ill-appearing or diaphoretic.  HENT:     Head: Normocephalic and atraumatic.     Right Ear: Tympanic membrane, ear canal and external ear normal.     Left Ear: Tympanic membrane, ear  canal and external ear normal.  Eyes:     General: No scleral icterus.       Right eye: No discharge.        Left eye: No discharge.     Extraocular Movements: Extraocular movements intact.     Conjunctiva/sclera: Conjunctivae normal.     Pupils: Pupils are equal, round, and reactive to light.  Neck:     Thyroid: No thyromegaly.     Vascular: No carotid bruit or JVD.     Trachea: No tracheal deviation.  Cardiovascular:     Rate and Rhythm: Normal rate and regular rhythm.     Pulses: Normal pulses.     Heart sounds: Murmur present. Systolic (heard best over 2nd ICS RSB; radiates slightly to R carotid) murmur present with a grade of 3/6. No friction rub. No gallop.   Pulmonary:     Effort: Pulmonary effort is normal. No respiratory distress.     Breath sounds: Normal breath sounds. No wheezing or rales.  Chest:     Chest wall: No tenderness.  Abdominal:     General: Bowel sounds are normal. There is no distension.     Palpations: Abdomen is soft. There is no mass.     Tenderness: There is no abdominal tenderness. There is no guarding or rebound.  Musculoskeletal:        General: No tenderness. Normal range of motion.     Cervical back: Normal range of motion and neck supple.  Lymphadenopathy:     Cervical: No cervical adenopathy.  Skin:    General: Skin is warm and dry.     Findings: No rash.  Neurological:     Mental Status: She is alert and oriented to person, place, and time.  Psychiatric:        Behavior: Behavior normal.        Thought Content: Thought content normal.        Judgment: Judgment normal.    Diabetic Foot Form - Detailed   Diabetic Foot Exam - detailed Diabetic Foot exam was performed with the following findings: Yes 11/17/2019  9:12 AM  Visual Foot Exam completed.: Yes  Can the patient see the bottom of their feet?: No Are the shoes appropriate in style and fit?: Yes Is there swelling or and abnormal foot shape?: No Is there a claw toe deformity?:  No Is there elevated skin temparature?: No Is there foot or ankle muscle weakness?: No Normal Range of Motion: Yes Pulse Foot Exam completed.: Yes  Right posterior Tibialias: Present Left posterior Tibialias: Present  Right Dorsalis Pedis: Present Left Dorsalis Pedis: Present  Sensory Foot Exam Completed.: Yes Semmes-Weinstein Monofilament Test  R Site 1-Great Toe: Neg L Site 1-Great Toe: Pos         No results found for any visits on 11/17/19.     Assessment & Plan    1. Essential (primary) hypertension Stable. Continue HCTZ 25mg , lisinopril 40mg , and Metoprolol 12.5mg  BID. Labs were normal on 11/04/19.  2. Paroxysmal atrial fibrillation (HCC) In NSR today. Continue Metoprolol 12.5mg  BID and ASA 81mg .   3. Type 2 diabetes mellitus with foot ulcer, without long-term current use of insulin (HCC) Stable. Went to Nutritionist last year. Continue Farxiga 5mg . Return in 6 months.   4. Essential thrombocythemia ([place]) Labs were normal from 11/04/19.  5. BMI 37.0-37.9, adult Counseled patient on healthy lifestyle modifications including dieting and exercise.   6. Combined fat and carbohydrate induced hyperlipemia Stable. Continue Simvastatin 20mg .      Mar Daring, PA-C  [facility]

## 2019-11-17 ENCOUNTER — Encounter: Payer: Self-pay | Admitting: Physician Assistant

## 2019-11-17 ENCOUNTER — Ambulatory Visit (INDEPENDENT_AMBULATORY_CARE_PROVIDER_SITE_OTHER): Payer: Medicare Other | Admitting: Physician Assistant

## 2019-11-17 ENCOUNTER — Other Ambulatory Visit: Payer: Self-pay

## 2019-11-17 VITALS — BP 140/70 | HR 57 | Temp 96.9°F | Resp 16 | Ht 65.0 in | Wt 226.8 lb

## 2019-11-17 DIAGNOSIS — I10 Essential (primary) hypertension: Secondary | ICD-10-CM

## 2019-11-17 DIAGNOSIS — E782 Mixed hyperlipidemia: Secondary | ICD-10-CM

## 2019-11-17 DIAGNOSIS — I480 Paroxysmal atrial fibrillation: Secondary | ICD-10-CM | POA: Diagnosis not present

## 2019-11-17 DIAGNOSIS — L97509 Non-pressure chronic ulcer of other part of unspecified foot with unspecified severity: Secondary | ICD-10-CM

## 2019-11-17 DIAGNOSIS — D473 Essential (hemorrhagic) thrombocythemia: Secondary | ICD-10-CM | POA: Diagnosis not present

## 2019-11-17 DIAGNOSIS — E11621 Type 2 diabetes mellitus with foot ulcer: Secondary | ICD-10-CM | POA: Diagnosis not present

## 2019-11-17 DIAGNOSIS — Z6837 Body mass index (BMI) 37.0-37.9, adult: Secondary | ICD-10-CM

## 2019-11-17 NOTE — Patient Instructions (Signed)

## 2019-12-11 ENCOUNTER — Ambulatory Visit: Payer: Federal, State, Local not specified - PPO | Admitting: Dermatology

## 2020-01-12 ENCOUNTER — Other Ambulatory Visit: Payer: Self-pay | Admitting: Physician Assistant

## 2020-01-12 DIAGNOSIS — L97509 Non-pressure chronic ulcer of other part of unspecified foot with unspecified severity: Secondary | ICD-10-CM

## 2020-02-12 ENCOUNTER — Ambulatory Visit (INDEPENDENT_AMBULATORY_CARE_PROVIDER_SITE_OTHER): Payer: Medicare Other | Admitting: Dermatology

## 2020-02-12 ENCOUNTER — Other Ambulatory Visit: Payer: Self-pay

## 2020-02-12 ENCOUNTER — Encounter: Payer: Self-pay | Admitting: Dermatology

## 2020-02-12 DIAGNOSIS — I781 Nevus, non-neoplastic: Secondary | ICD-10-CM

## 2020-02-12 DIAGNOSIS — Z85828 Personal history of other malignant neoplasm of skin: Secondary | ICD-10-CM

## 2020-02-12 DIAGNOSIS — L820 Inflamed seborrheic keratosis: Secondary | ICD-10-CM | POA: Diagnosis not present

## 2020-02-12 DIAGNOSIS — L578 Other skin changes due to chronic exposure to nonionizing radiation: Secondary | ICD-10-CM

## 2020-02-12 DIAGNOSIS — D1801 Hemangioma of skin and subcutaneous tissue: Secondary | ICD-10-CM

## 2020-02-12 DIAGNOSIS — Z1283 Encounter for screening for malignant neoplasm of skin: Secondary | ICD-10-CM | POA: Diagnosis not present

## 2020-02-12 DIAGNOSIS — L720 Epidermal cyst: Secondary | ICD-10-CM

## 2020-02-12 DIAGNOSIS — L814 Other melanin hyperpigmentation: Secondary | ICD-10-CM

## 2020-02-12 DIAGNOSIS — D229 Melanocytic nevi, unspecified: Secondary | ICD-10-CM | POA: Diagnosis not present

## 2020-02-12 DIAGNOSIS — L821 Other seborrheic keratosis: Secondary | ICD-10-CM

## 2020-02-12 NOTE — Progress Notes (Signed)
   Follow-Up Visit   Subjective  Stacie Porter is a 74 y.o. female who presents for the following: Annual Exam (Total body skin exam). The patient presents for Total-Body Skin Exam (TBSE) for skin cancer screening and mole check.  The following portions of the chart were reviewed this encounter and updated as appropriate:  Tobacco  Allergies  Meds  Problems  Med Hx  Surg Hx  Fam Hx      Review of Systems:  No other skin or systemic complaints except as noted in HPI or Assessment and Plan.  Objective  Well appearing patient in no apparent distress; mood and affect are within normal limits.  A full examination was performed including scalp, head, eyes, ears, nose, lips, neck, chest, axillae, abdomen, back, buttocks, bilateral upper extremities, bilateral lower extremities, hands, feet, fingers, toes, fingernails, and toenails. All findings within normal limits unless otherwise noted below.  Objective  Nose: Well healed scar with no evidence of recurrence.   Objective  Face: Smooth white papule(s).   Objective  Bil legs: Telangiectasias bil legs  Objective  Right pretibial x 1: Erythematous keratotic or waxy stuck-on papule or plaque.    Assessment & Plan    Lentigines - Scattered tan macules - Discussed due to sun exposure - Benign, observe - Call for any changes  Seborrheic Keratoses - Stuck-on, waxy, tan-brown papules and plaques  - Discussed benign etiology and prognosis. - Observe - Call for any changes  Melanocytic Nevi - Tan-brown and/or pink-flesh-colored symmetric macules and papules - Benign appearing on exam today - Observation - Call clinic for new or changing moles - Recommend daily use of broad spectrum spf 30+ sunscreen to sun-exposed areas.   Hemangiomas - Red papules - Discussed benign nature - Observe - Call for any changes  Actinic Damage - diffuse scaly erythematous macules with underlying dyspigmentation - Recommend daily broad  spectrum sunscreen SPF 30+ to sun-exposed areas, reapply every 2 hours as needed.  - Call for new or changing lesions.  Skin cancer screening performed today.   History of basal cell carcinoma (BCC) Nose Clear. Observe for recurrence. Call clinic for new or changing lesions.  Recommend regular skin exams, daily broad-spectrum spf 30+ sunscreen use, and photoprotection.   Clear, observe for changes  Milia Face  Benign, observe.   Spider veins Bil legs  Benign, observe.    Inflamed seborrheic keratosis Right pretibial x 1  Destruction of lesion - Right pretibial x 1 Complexity: simple   Destruction method: cryotherapy   Informed consent: discussed and consent obtained   Timeout:  patient name, date of birth, surgical site, and procedure verified Lesion destroyed using liquid nitrogen: Yes   Region frozen until ice ball extended beyond lesion: Yes   Outcome: patient tolerated procedure well with no complications   Post-procedure details: wound care instructions given    Return in about 1 year (around 02/11/2021) for TBSE, hx of BCC.  I, Othelia Pulling, RMA, am acting as scribe for Sarina Ser, MD .  Documentation: I have reviewed the above documentation for accuracy and completeness, and I agree with the above.  Sarina Ser, MD

## 2020-02-12 NOTE — Patient Instructions (Signed)
Cryotherapy Aftercare  . Wash gently with soap and water everyday.   . Apply Vaseline and Band-Aid daily until healed.  

## 2020-02-15 ENCOUNTER — Encounter: Payer: Self-pay | Admitting: Dermatology

## 2020-03-29 LAB — HM DIABETES EYE EXAM

## 2020-04-16 ENCOUNTER — Other Ambulatory Visit: Payer: Self-pay | Admitting: Physician Assistant

## 2020-04-16 DIAGNOSIS — K2100 Gastro-esophageal reflux disease with esophagitis, without bleeding: Secondary | ICD-10-CM

## 2020-04-28 ENCOUNTER — Other Ambulatory Visit: Payer: Self-pay | Admitting: Physician Assistant

## 2020-04-28 DIAGNOSIS — E785 Hyperlipidemia, unspecified: Secondary | ICD-10-CM

## 2020-04-30 ENCOUNTER — Other Ambulatory Visit: Payer: Self-pay | Admitting: Physician Assistant

## 2020-04-30 DIAGNOSIS — L97509 Non-pressure chronic ulcer of other part of unspecified foot with unspecified severity: Secondary | ICD-10-CM

## 2020-04-30 NOTE — Telephone Encounter (Signed)
Requested Prescriptions  Pending Prescriptions Disp Refills  . FARXIGA 5 MG TABS tablet [Pharmacy Med Name: FARXIGA 5 MG TABLET] 90 tablet 0    Sig: TAKE 1 TABLET BY MOUTH EVERY DAY     Endocrinology:  Diabetes - SGLT2 Inhibitors Failed - 04/30/2020  4:12 PM      Failed - LDL in normal range and within 360 days    LDL Chol Calc (NIH)  Date Value Ref Range Status  11/04/2019 93 0 - 99 mg/dL Final         Failed - Valid encounter within last 6 months    Recent Outpatient Visits          9 months ago Type 2 diabetes mellitus with foot ulcer, without long-term current use of insulin [facility])   [facility] [place], [place], [place]   11 months ago Gastroesophageal reflux disease, esophagitis presence not specified   [place], [place], PA-C   1 year ago Type 2 diabetes mellitus with foot ulcer, without long-term current use of insulin [facility])   [place], [place], [place]   1 year ago Type 2 diabetes mellitus with foot ulcer, without long-term current use of insulin [facility])   [place], [place], [place]   1 year ago Type 2 diabetes mellitus with foot ulcer, without long-term current use of insulin [facility])   [facility] [place], [place], [place]      Future Appointments            In 3 weeks [place], [place], PA-C [employer]           Passed - Cr in normal range and within 360 days    Creatinine  Date Value Ref Range Status  12/03/2014 0.65 mg/dL Final    Comment:    0.44-1.00 NOTE: New Reference Range  11/03/14    Creatinine, Ser  Date Value Ref Range Status  11/04/2019 0.82 0.57 - 1.00 mg/dL Final         Passed - HBA1C is between 0 and 7.9 and within 180 days    Hgb A1c MFr Bld  Date Value Ref Range Status  11/04/2019 6.8 (H) 4.8 - 5.6 % Final    Comment:             Prediabetes: 5.7 - 6.4          Diabetes: >6.4          Glycemic control  for adults with diabetes: <7.0          Passed - eGFR in normal range and within 360 days    EGFR (African American)  Date Value Ref Range Status  12/03/2014 >60  Final   GFR calc Af Amer  Date Value Ref Range Status  11/04/2019 82 >59 mL/min/1.73 Final   EGFR (Non-African Amer.)  Date Value Ref Range Status  12/03/2014 >60  Final    Comment:    eGFR values <6m/min/1.73 m2 may be an indication of chronic kidney disease (CKD). Calculated eGFR is useful in patients with stable renal function. The eGFR calculation will not be reliable in acutely ill patients when serum creatinine is changing rapidly. It is not useful in patients on dialysis. The eGFR calculation may not be applicable to patients at the low and high extremes of body sizes, pregnant women, and vegetarians.    GFR calc non Af Amer  Date Value Ref Range Status  11/04/2019 71 >  59 mL/min/1.73 Final

## 2020-05-20 ENCOUNTER — Ambulatory Visit: Payer: Self-pay | Admitting: Physician Assistant

## 2020-05-21 ENCOUNTER — Other Ambulatory Visit: Payer: Self-pay

## 2020-05-21 ENCOUNTER — Encounter: Payer: Self-pay | Admitting: Physician Assistant

## 2020-05-21 ENCOUNTER — Ambulatory Visit (INDEPENDENT_AMBULATORY_CARE_PROVIDER_SITE_OTHER): Payer: Medicare Other | Admitting: Physician Assistant

## 2020-05-21 VITALS — BP 140/55 | HR 60 | Temp 98.4°F | Resp 16 | Wt 229.2 lb

## 2020-05-21 DIAGNOSIS — E11621 Type 2 diabetes mellitus with foot ulcer: Secondary | ICD-10-CM | POA: Diagnosis not present

## 2020-05-21 DIAGNOSIS — I10 Essential (primary) hypertension: Secondary | ICD-10-CM | POA: Diagnosis not present

## 2020-05-21 DIAGNOSIS — L97509 Non-pressure chronic ulcer of other part of unspecified foot with unspecified severity: Secondary | ICD-10-CM | POA: Diagnosis not present

## 2020-05-21 DIAGNOSIS — E782 Mixed hyperlipidemia: Secondary | ICD-10-CM

## 2020-05-21 DIAGNOSIS — Z23 Encounter for immunization: Secondary | ICD-10-CM

## 2020-05-21 DIAGNOSIS — Z6838 Body mass index (BMI) 38.0-38.9, adult: Secondary | ICD-10-CM

## 2020-05-21 LAB — POCT GLYCOSYLATED HEMOGLOBIN (HGB A1C)
Est. average glucose Bld gHb Est-mCnc: 169
Hemoglobin A1C: 7.5 % — AB (ref 4.0–5.6)

## 2020-05-21 MED ORDER — DAPAGLIFLOZIN PROPANEDIOL 10 MG PO TABS: 10.0000 mg | ORAL_TABLET | Freq: Every day | ORAL | 1 refills | Status: DC

## 2020-05-21 NOTE — Progress Notes (Signed)
Established patient visit   Patient: Stacie Porter   DOB: 01/13/1946   74 y.o. Female  MRN: [id number] Visit Date: 05/21/2020  Today's healthcare provider: Mar Daring, PA-C   Chief Complaint  Patient presents with  . T2DM   Subjective    HPI  Diabetes Mellitus Type II, Follow-up  Lab Results  Component Value Date   HGBA1C 7.5 (A) 05/21/2020   HGBA1C 6.8 (H) 11/04/2019   HGBA1C 6.8 (A) 07/10/2019   Wt Readings from Last 3 Encounters:  05/21/20 229 lb 3.2 oz (104 kg)  11/17/19 226 lb 12.8 oz (102.9 kg)  08/12/19 227 lb 11.2 oz (103.3 kg)   Last seen for diabetes 6 months ago.  Management since then includes continue [place]. She reports excellent compliance with treatment. She is not having side effects.    Symptoms: No fatigue No foot ulcerations  No appetite changes No nausea  No paresthesia of the feet  No polydipsia  No polyuria No visual disturbances   No vomiting     Home blood sugar records: fasting range: 150  Episodes of hypoglycemia? No    Current insulin regiment: none Most Recent Eye Exam: 09/30/19 and another one in July 2021 Current exercise: walking Current diet habits: in general, a "healthy" diet    Pertinent Labs: Lab Results  Component Value Date   CHOL 168 11/04/2019   HDL 39 (L) 11/04/2019   LDLCALC 93 11/04/2019   TRIG 212 (H) 11/04/2019   CHOLHDL 4.0 09/23/2018   Lab Results  Component Value Date   NA 139 11/04/2019   K 4.2 11/04/2019   CREATININE 0.82 11/04/2019   GFRNONAA 71 11/04/2019   GFRAA 82 11/04/2019   GLUCOSE 140 (H) 11/04/2019     --------------------------------------------------------------------------------------------------- Hypertension, follow-up  BP Readings from Last 3 Encounters:  05/21/20 (!) 140/55  11/17/19 140/70  07/10/19 (!) 146/75   Wt Readings from Last 3 Encounters:  05/21/20 229 lb 3.2 oz (104 kg)  11/17/19 226 lb 12.8 oz (102.9 kg)  08/12/19 227 lb 11.2 oz (103.3 kg)      She was last seen for hypertension 6 months ago.  BP at that visit was 140/70. Management since that visit includes none.stable. Continue HCTZ 25mg , Lisinorpil 40mg , and Metoprolol 12.5 mg BID  She reports excellent compliance with treatment. She is not having side effects.  She does not smoke.  Outside blood pressures are 118/57 last time she checked.   Symptoms: No chest pain No chest pressure  No palpitations No syncope  No dyspnea No orthopnea  No paroxysmal nocturnal dyspnea No lower extremity edema   Pertinent labs: Lab Results  Component Value Date   CHOL 168 11/04/2019   HDL 39 (L) 11/04/2019   LDLCALC 93 11/04/2019   TRIG 212 (H) 11/04/2019   CHOLHDL 4.0 09/23/2018   Lab Results  Component Value Date   NA 139 11/04/2019   K 4.2 11/04/2019   CREATININE 0.82 11/04/2019   GFRNONAA 71 11/04/2019   GFRAA 82 11/04/2019   GLUCOSE 140 (H) 11/04/2019     The 10-year ASCVD risk score Mikey Bussing DC Jr., et al., 2013) is: 35.2%   --------------------------------------------------------------------------------------------------- Patient Active Problem List   Diagnosis Date Noted  . Fatty liver 07/18/2019  . Type 2 diabetes mellitus with foot ulcer, without long-term current use of insulin ([place]) 10/04/2018  . 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 MI (mitral incompetence) 07/12/2015  . Paroxysmal atrial fibrillation ([place]) 07/12/2015  .  Abdominal pain 05/17/2015  . Strain of tendon of foot and ankle 05/17/2015  . BMI 37.0-37.9, adult 05/17/2015  . Esophagitis, reflux 05/17/2015  . Dizziness 05/17/2015  . Essential (primary) hypertension 05/17/2015  . TI (tricuspid incompetence) 01/11/2015  . H/O malignant neoplasm of skin 02/05/2013  . Essential thrombocythemia ([place]) 10/01/2012  . Blood in the urine 02/24/2010  . History of shingles 09/16/2007   Past Medical History:  Diagnosis Date  . Diabetes mellitus without complication ([place])    type 2   . Dysrhythmia    a fib  . Hx of basal cell carcinoma ~2014 [facility]   Nose  . Hyperlipidemia   . Hypertension   . Osteomyelitis of second toe of right foot ([place]) 11/11/2018  . Sleep apnea    cpap       Medications: Outpatient Medications Prior to Visit  Medication Sig  . ASPIRIN LOW DOSE 81 MG EC tablet TAKE 1 TABLET BY MOUTH EVERY DAY  . Calcium Carbonate-Vitamin D 600-200 MG-UNIT CAPS Take 1 tablet by mouth daily.  Marland Kitchen FARXIGA 5 MG TABS tablet TAKE 1 TABLET BY MOUTH EVERY DAY  . Glucosamine HCl-MSM (GLUCOSAMINE-MSM PO) Take 1 tablet by mouth at bedtime.  . hydrochlorothiazide (HYDRODIURIL) 25 MG tablet Take 25 mg by mouth daily.  Marland Kitchen lisinopril (PRINIVIL,ZESTRIL) 40 MG tablet Take 40 mg by mouth daily.   . metoprolol tartrate (LOPRESSOR) 25 MG tablet Take 12.5 mg by mouth 2 (two) times daily.   . MULTIPLE VITAMIN PO Take 1 tablet by mouth daily.  . Omega-3 Fatty Acids (FISH OIL) 1200 MG CAPS Take 1,200 mg by mouth daily.  Marland Kitchen omeprazole (PRILOSEC) 20 MG capsule TAKE 1 CAPSULE (20 MG TOTAL) BY MOUTH 2 (TWO) TIMES DAILY BEFORE A MEAL.  . simvastatin (ZOCOR) 20 MG tablet TAKE 1 TABLET BY MOUTH DAILY  . zinc gluconate 50 MG tablet Take 50 mg by mouth daily.   No facility-administered medications prior to visit.    Review of Systems  Constitutional: Negative.   Respiratory: Negative.   Cardiovascular: Negative.   Endocrine: Negative for polydipsia, polyphagia and polyuria.  Neurological: Negative.     Last CBC Lab Results  Component Value Date   WBC 5.7 11/04/2019   HGB 13.2 11/04/2019   HCT 38.4 11/04/2019   MCV 96 11/04/2019   MCH 33.0 11/04/2019   RDW 12.8 11/04/2019   PLT 118 (L) 09/81/1914   Last metabolic panel Lab Results  Component Value Date   GLUCOSE 140 (H) 11/04/2019   NA 139 11/04/2019   K 4.2 11/04/2019   CL 100 11/04/2019   CO2 24 11/04/2019   BUN 21 11/04/2019   CREATININE 0.82 11/04/2019   GFRNONAA 71 11/04/2019   GFRAA 82 11/04/2019   CALCIUM 9.7  11/04/2019   PROT 6.2 11/04/2019   ALBUMIN 4.2 11/04/2019   LABGLOB 2.0 11/04/2019   AGRATIO 2.1 11/04/2019   BILITOT 0.9 11/04/2019   ALKPHOS 63 11/04/2019   AST 24 11/04/2019   ALT 20 11/04/2019   ANIONGAP 9 11/07/2018      Objective    BP (!) 140/55 (BP Location: Left Arm, Patient Position: Sitting, Cuff Size: Large)   Pulse 60   Temp 98.4 F (36.9 C) (Oral)   Resp 16   Wt 229 lb 3.2 oz (104 kg)   SpO2 97%   BMI 38.14 kg/m  BP Readings from Last 3 Encounters:  05/21/20 (!) 140/55  11/17/19 140/70  07/10/19 (!) 146/75   Wt Readings from Last  3 Encounters:  05/21/20 229 lb 3.2 oz (104 kg)  11/17/19 226 lb 12.8 oz (102.9 kg)  08/12/19 227 lb 11.2 oz (103.3 kg)      Physical Exam Vitals reviewed.  Constitutional:      General: She is not in acute distress.    Appearance: Normal appearance. She is well-developed. She is obese. She is not ill-appearing or diaphoretic.  Cardiovascular:     Rate and Rhythm: Normal rate and regular rhythm.     Pulses: Normal pulses.     Heart sounds: Normal heart sounds. No murmur heard.  No friction rub. No gallop.   Pulmonary:     Effort: Pulmonary effort is normal. No respiratory distress.     Breath sounds: Normal breath sounds. No wheezing or rales.  Musculoskeletal:     Cervical back: Normal range of motion and neck supple.  Neurological:     Mental Status: She is alert.       Results for orders placed or performed in visit on 05/21/20  POCT glycosylated hemoglobin (Hb A1C)  Result Value Ref Range   Hemoglobin A1C 7.5 (A) 4.0 - 5.6 %   Est. average glucose Bld gHb Est-mCnc 169     Assessment & Plan     1. Type 2 diabetes mellitus with foot ulcer, without long-term current use of insulin (HCC) Increased. Change farxiga from 5 mg to 10mg  as below. Continue simvastatin and lisinopril. F/U in 3 months.  - dapagliflozin propanediol (FARXIGA) 10 MG TABS tablet; Take 1 tablet (10 mg total) by mouth daily before breakfast.   Dispense: 90 tablet; Refill: 1  2. Essential (primary) hypertension Stable. Continue current medical treatment plan.  3. Class 2 severe obesity due to excess calories with serious comorbidity and body mass index (BMI) of 38.0 to 38.9 in adult [facility]) Counseled patient on healthy lifestyle modifications including dieting and exercise.   4. Combined fat and carbohydrate induced hyperlipemia Continue simvastatin 20mg .   5. Need for influenza vaccination Flu vaccine given today without complication. Patient sat upright for 15 minutes to check for adverse reaction before being released. - Flu Vaccine QUAD High Dose(Fluad)  No follow-ups on file.      Reynolds Bowl, PA-C, have reviewed all documentation for this visit. The documentation on 05/24/20 for the exam, diagnosis, procedures, and orders are all accurate and complete.   Rubye Beach  [facility] [phone] (phone) [phone] (fax)  [place]

## 2020-05-21 NOTE — Patient Instructions (Signed)
Carbohydrate Counting for Diabetes Mellitus, Adult  Carbohydrate counting is a method of keeping track of how many carbohydrates you eat. Eating carbohydrates naturally increases the amount of sugar (glucose) in the blood. Counting how many carbohydrates you eat helps keep your blood glucose within normal limits, which helps you manage your diabetes (diabetes mellitus). It is important to know how many carbohydrates you can safely have in each meal. This is different for every person. A diet and nutrition specialist (registered dietitian) can help you make a meal plan and calculate how many carbohydrates you should have at each meal and snack. Carbohydrates are found in the following foods:  Grains, such as breads and cereals.  Dried beans and soy products.  Starchy vegetables, such as potatoes, peas, and corn.  Fruit and fruit juices.  Milk and yogurt.  Sweets and snack foods, such as cake, cookies, candy, chips, and soft drinks. How do I count carbohydrates? There are two ways to count carbohydrates in food. You can use either of the methods or a combination of both. Reading "Nutrition Facts" on packaged food The "Nutrition Facts" list is included on the labels of almost all packaged foods and beverages in the [place] It includes:  The serving size.  Information about nutrients in each serving, including the grams (g) of carbohydrate per serving. To use the "Nutrition Facts":  Decide how many servings you will have.  Multiply the number of servings by the number of carbohydrates per serving.  The resulting number is the total amount of carbohydrates that you will be having. Learning standard serving sizes of other foods When you eat carbohydrate foods that are not packaged or do not include "Nutrition Facts" on the label, you need to measure the servings in order to count the amount of carbohydrates:  Measure the foods that you will eat with a food scale or measuring cup, if  needed.  Decide how many standard-size servings you will eat.  Multiply the number of servings by 15. Most carbohydrate-rich foods have about 15 g of carbohydrates per serving. ? For example, if you eat 8 oz (170 g) of strawberries, you will have eaten 2 servings and 30 g of carbohydrates (2 servings x 15 g = 30 g).  For foods that have more than one food mixed, such as soups and casseroles, you must count the carbohydrates in each food that is included. The following list contains standard serving sizes of common carbohydrate-rich foods. Each of these servings has about 15 g of carbohydrates:   hamburger bun or  English muffin.   oz (15 mL) syrup.   oz (14 g) jelly.  1 slice of bread.  1 six-inch tortilla.  3 oz (85 g) cooked rice or pasta.  4 oz (113 g) cooked dried beans.  4 oz (113 g) starchy vegetable, such as peas, corn, or potatoes.  4 oz (113 g) hot cereal.  4 oz (113 g) mashed potatoes or  of a large baked potato.  4 oz (113 g) canned or frozen fruit.  4 oz (120 mL) fruit juice.  4-6 crackers.  6 chicken nuggets.  6 oz (170 g) unsweetened dry cereal.  6 oz (170 g) plain fat-free yogurt or yogurt sweetened with artificial sweeteners.  8 oz (240 mL) milk.  8 oz (170 g) fresh fruit or one small piece of fruit.  24 oz (680 g) popped popcorn. Example of carbohydrate counting Sample meal  3 oz (85 g) chicken breast.  6 oz (170 g)   brown rice.  4 oz (113 g) corn.  8 oz (240 mL) milk.  8 oz (170 g) strawberries with sugar-free whipped topping. Carbohydrate calculation 1. Identify the foods that contain carbohydrates: ? Rice. ? Corn. ? Milk. ? Strawberries. 2. Calculate how many servings you have of each food: ? 2 servings rice. ? 1 serving corn. ? 1 serving milk. ? 1 serving strawberries. 3. Multiply each number of servings by 15 g: ? 2 servings rice x 15 g = 30 g. ? 1 serving corn x 15 g = 15 g. ? 1 serving milk x 15 g = 15 g. ? 1  serving strawberries x 15 g = 15 g. 4. Add together all of the amounts to find the total grams of carbohydrates eaten: ? 30 g + 15 g + 15 g + 15 g = 75 g of carbohydrates total. Summary  Carbohydrate counting is a method of keeping track of how many carbohydrates you eat.  Eating carbohydrates naturally increases the amount of sugar (glucose) in the blood.  Counting how many carbohydrates you eat helps keep your blood glucose within normal limits, which helps you manage your diabetes.  A diet and nutrition specialist (registered dietitian) can help you make a meal plan and calculate how many carbohydrates you should have at each meal and snack. This information is not intended to replace advice given to you by your health care provider. Make sure you discuss any questions you have with your health care provider. Document Revised: 03/08/2017 Document Reviewed: 01/26/2016 Elsevier Patient Education  2020 Elsevier Inc.  

## 2020-06-09 NOTE — Progress Notes (Signed)
Established patient visit   Patient: Stacie Porter   DOB: 1946/07/16   74 y.o. Female  MRN: [id number] Visit Date: 06/11/2020  Today's healthcare provider: Mar Daring, PA-C   Chief Complaint  Patient presents with  . Ear Drainage   Subjective    HPI  Patient here with c/o some fluid coming out from left ear lobe/ear piercing. Symptoms started a week ago after wearing some earrings she always wears. Drainage has been clear. There was some mild tenderness and redness, but that has improved. She has been cleaning daily and putting antibiotic ointment on it. She has also avoided the earrings.       Medications: Outpatient Medications Prior to Visit  Medication Sig  . ASPIRIN LOW DOSE 81 MG EC tablet TAKE 1 TABLET BY MOUTH EVERY DAY  . Calcium Carbonate-Vitamin D 600-200 MG-UNIT CAPS Take 1 tablet by mouth daily.  . dapagliflozin propanediol (FARXIGA) 10 MG TABS tablet Take 1 tablet (10 mg total) by mouth daily before breakfast.  . hydrochlorothiazide (HYDRODIURIL) 25 MG tablet Take 25 mg by mouth daily.  . metoprolol tartrate (LOPRESSOR) 25 MG tablet Take 12.5 mg by mouth 2 (two) times daily.   . MULTIPLE VITAMIN PO Take 1 tablet by mouth daily.  . Omega-3 Fatty Acids (FISH OIL) 1200 MG CAPS Take 1,200 mg by mouth daily.  Marland Kitchen omeprazole (PRILOSEC) 20 MG capsule TAKE 1 CAPSULE (20 MG TOTAL) BY MOUTH 2 (TWO) TIMES DAILY BEFORE A MEAL. (Patient taking differently: Take 20 mg by mouth 2 (two) times daily before a meal. )  . simvastatin (ZOCOR) 20 MG tablet TAKE 1 TABLET BY MOUTH DAILY  . zinc gluconate 50 MG tablet Take 50 mg by mouth daily.  . Glucosamine HCl-MSM (GLUCOSAMINE-MSM PO) Take 1 tablet by mouth at bedtime.  Marland Kitchen lisinopril (PRINIVIL,ZESTRIL) 40 MG tablet Take 40 mg by mouth daily.    No facility-administered medications prior to visit.    Review of Systems  Constitutional: Negative.   Respiratory: Negative.   Cardiovascular: Negative.   Skin: Positive for  wound.  Neurological: Negative.     Last CBC Lab Results  Component Value Date   WBC 5.7 11/04/2019   HGB 13.2 11/04/2019   HCT 38.4 11/04/2019   MCV 96 11/04/2019   MCH 33.0 11/04/2019   RDW 12.8 11/04/2019   PLT 118 (L) 78/24/2353   Last metabolic panel Lab Results  Component Value Date   GLUCOSE 140 (H) 11/04/2019   NA 139 11/04/2019   K 4.2 11/04/2019   CL 100 11/04/2019   CO2 24 11/04/2019   BUN 21 11/04/2019   CREATININE 0.82 11/04/2019   GFRNONAA 71 11/04/2019   GFRAA 82 11/04/2019   CALCIUM 9.7 11/04/2019   PROT 6.2 11/04/2019   ALBUMIN 4.2 11/04/2019   LABGLOB 2.0 11/04/2019   AGRATIO 2.1 11/04/2019   BILITOT 0.9 11/04/2019   ALKPHOS 63 11/04/2019   AST 24 11/04/2019   ALT 20 11/04/2019   ANIONGAP 9 11/07/2018      Objective    BP (!) 131/55 (BP Location: Left Arm, Patient Position: Sitting, Cuff Size: Large)   Pulse 60   Temp 97.8 F (36.6 C) (Oral)   Resp 16   Wt 228 lb 3.2 oz (103.5 kg)   BMI 37.97 kg/m  BP Readings from Last 3 Encounters:  06/11/20 (!) 131/55  05/21/20 (!) 140/55  11/17/19 140/70   Wt Readings from Last 3 Encounters:  06/11/20 228 lb 3.2 oz (  103.5 kg)  05/21/20 229 lb 3.2 oz (104 kg)  11/17/19 226 lb 12.8 oz (102.9 kg)      Physical Exam Vitals reviewed.  Constitutional:      General: She is not in acute distress.    Appearance: Normal appearance. She is well-developed and well-groomed. She is obese. She is not ill-appearing or diaphoretic.  HENT:     Head: Normocephalic and atraumatic.     Left Ear: Drainage present. No swelling.     Ears:   Pulmonary:     Effort: Pulmonary effort is normal. No respiratory distress.  Musculoskeletal:     Cervical back: Normal range of motion and neck supple.  Skin:    General: Skin is warm and dry.  Neurological:     Mental Status: She is alert.  Psychiatric:        Mood and Affect: Mood normal.        Behavior: Behavior normal. Behavior is cooperative.        Thought  Content: Thought content normal.        Judgment: Judgment normal.       No results found for any visits on 06/11/20.  Assessment & Plan     1. Infection of ear lobe, left Appears to be self resolving. Advised to continue to clean the ear with saline wash twice daily, clear away debris. May continue the triple antibiotic ointment. Warm compresses can help also. Avoid earrings at this time. Give one more week, and call if not improving or if symptoms worsen again.    No follow-ups on file.      Reynolds Bowl, PA-C, have reviewed all documentation for this visit. The documentation on 06/11/20 for the exam, diagnosis, procedures, and orders are all accurate and complete.   Rubye Beach  [facility] [phone] (phone) [phone] (fax)  [place]

## 2020-06-11 ENCOUNTER — Encounter: Payer: Self-pay | Admitting: Physician Assistant

## 2020-06-11 ENCOUNTER — Other Ambulatory Visit: Payer: Self-pay

## 2020-06-11 ENCOUNTER — Ambulatory Visit (INDEPENDENT_AMBULATORY_CARE_PROVIDER_SITE_OTHER): Payer: Medicare Other | Admitting: Physician Assistant

## 2020-06-11 VITALS — BP 131/55 | HR 60 | Temp 97.8°F | Resp 16 | Wt 228.2 lb

## 2020-06-11 DIAGNOSIS — H60392 Other infective otitis externa, left ear: Secondary | ICD-10-CM | POA: Diagnosis not present

## 2020-06-11 NOTE — Patient Instructions (Signed)
Saline wound wash twice daily Clear debris away Keep dry Apply antibiotic ointment after cleaning Warm compress can be used Avoid earrings until cleared

## 2020-06-25 ENCOUNTER — Other Ambulatory Visit: Payer: Self-pay | Admitting: Physician Assistant

## 2020-06-25 DIAGNOSIS — Z1231 Encounter for screening mammogram for malignant neoplasm of breast: Secondary | ICD-10-CM

## 2020-07-06 ENCOUNTER — Encounter: Payer: Self-pay | Admitting: Physician Assistant

## 2020-07-06 DIAGNOSIS — L97509 Non-pressure chronic ulcer of other part of unspecified foot with unspecified severity: Secondary | ICD-10-CM

## 2020-07-06 DIAGNOSIS — E11621 Type 2 diabetes mellitus with foot ulcer: Secondary | ICD-10-CM

## 2020-07-26 ENCOUNTER — Ambulatory Visit: Payer: Federal, State, Local not specified - PPO | Admitting: Dietician

## 2020-08-06 ENCOUNTER — Encounter: Payer: Self-pay | Admitting: Physician Assistant

## 2020-08-06 DIAGNOSIS — R059 Cough, unspecified: Secondary | ICD-10-CM

## 2020-08-06 MED ORDER — BENZONATATE 200 MG PO CAPS: 200.0000 mg | ORAL_CAPSULE | Freq: Three times a day (TID) | ORAL | 0 refills | Status: DC | PRN

## 2020-08-06 NOTE — Addendum Note (Signed)
Addended by: Mar Daring on: 08/06/2020 01:51 PM   Modules accepted: Orders

## 2020-08-08 LAB — SARS-COV-2, NAA 2 DAY TAT

## 2020-08-08 LAB — NOVEL CORONAVIRUS, NAA: SARS-CoV-2, NAA: DETECTED — AB

## 2020-08-09 ENCOUNTER — Telehealth: Payer: Self-pay | Admitting: Nurse Practitioner

## 2020-08-09 ENCOUNTER — Telehealth: Payer: Self-pay

## 2020-08-09 DIAGNOSIS — U071 COVID-19: Secondary | ICD-10-CM

## 2020-08-09 NOTE — Telephone Encounter (Signed)
Called to discuss with Stacie Porter about Covid symptoms and the use of a monoclonal antibody infusion for those with mild to moderate Covid symptoms and at a high risk of hospitalization.     Pt is qualified for this infusion at the [facility] due to co-morbid conditions and/or a member of an at-risk group, however declines infusion at this time. Today is her 10th day of symptoms. Symptoms have nearly resolved except for cough. She is feeling nearly at baseline. She was vaccinated. Declines infusion.   Symptoms tier reviewed as well as criteria for ending isolation.  Symptoms reviewed that would warrant ED/Hospital evaluation. Preventative practices reviewed. Patient verbalized understanding. Patient advised to call back if he/she opts to proceed with infusion. Callback number provided. Urgent care and/or ER precautions given for severe symptoms. Last date eligible for infusion: 08/09/20     Patient Active Problem List   Diagnosis Date Noted  . Fatty liver 07/18/2019  . Type 2 diabetes mellitus with foot ulcer, without long-term current use of insulin ([place]) 10/04/2018  . 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 MI (mitral incompetence) 07/12/2015  . Paroxysmal atrial fibrillation ([place]) 07/12/2015  . Abdominal pain 05/17/2015  . Strain of tendon of foot and ankle 05/17/2015  . BMI 37.0-37.9, adult 05/17/2015  . Esophagitis, reflux 05/17/2015  . Dizziness 05/17/2015  . Essential (primary) hypertension 05/17/2015  . TI (tricuspid incompetence) 01/11/2015  . H/O malignant neoplasm of skin 02/05/2013  . Essential thrombocythemia ([place]) 10/01/2012  . Blood in the urine 02/24/2010  . History of shingles 09/16/2007     Stacie Rutter, NP [phone] [email or web address]

## 2020-08-09 NOTE — Telephone Encounter (Signed)
-----   Message from Mar Daring, [place] sent at 08/09/2020  8:06 AM EST ----- Stacie Porter, you are covid positive. You need to isolate for 10 days from day of symptom onset. Would you like me to refer you for the monoclonal antibody infusion? If so, could you remind me when your symptoms started again. Also how are you feeling?

## 2020-08-09 NOTE — Telephone Encounter (Signed)
Written by Mar Daring, PA-C on 08/09/2020 8:06 AM EST Seen by patient Stacie Porter on 08/09/2020 8:19 AM

## 2020-08-11 ENCOUNTER — Ambulatory Visit: Payer: Federal, State, Local not specified - PPO | Admitting: Dietician

## 2020-08-16 ENCOUNTER — Encounter: Payer: Self-pay | Admitting: Physician Assistant

## 2020-08-16 ENCOUNTER — Other Ambulatory Visit: Payer: Self-pay | Admitting: Physician Assistant

## 2020-08-16 MED ORDER — FLUCONAZOLE 150 MG PO TABS: 150.0000 mg | ORAL_TABLET | Freq: Once | ORAL | 0 refills | Status: AC

## 2020-08-16 NOTE — Progress Notes (Signed)
Diflucan sent in

## 2020-08-24 NOTE — Progress Notes (Signed)
Established patient visit   Patient: Stacie Porter   DOB: 1945-12-08   74 y.o. Female  MRN: [id number] Visit Date: 08/25/2020  Today's healthcare provider: Mar Daring, PA-C   Chief Complaint  Patient presents with  . Diabetes  . Hypertension  . Hyperlipidemia   Subjective    HPI  Diabetes Mellitus Type II, Follow-up  Lab Results  Component Value Date   HGBA1C 7.3 (A) 08/25/2020   HGBA1C 7.5 (A) 05/21/2020   HGBA1C 6.8 (H) 11/04/2019   Wt Readings from Last 3 Encounters:  06/11/20 228 lb 3.2 oz (103.5 kg)  05/21/20 229 lb 3.2 oz (104 kg)  11/17/19 226 lb 12.8 oz (102.9 kg)   Last seen for diabetes 3 months ago.  Management since then includes Change farxiga from 5 mg to 10 mg. Continue simvastatin and lisinopril.  She reports excellent compliance with treatment. She is not having side effects.  Symptoms: No fatigue No foot ulcerations  No appetite changes No nausea  No paresthesia of the feet  No polydipsia  No polyuria No visual disturbances   No vomiting     Home blood sugar records: fasting range: 150's  Episodes of hypoglycemia? No    Current insulin regiment: none Most Recent Eye Exam: UTD Current exercise: none Current diet habits: in general, an "unhealthy" diet  Pertinent Labs: Lab Results  Component Value Date   CHOL 168 11/04/2019   HDL 39 (L) 11/04/2019   LDLCALC 93 11/04/2019   TRIG 212 (H) 11/04/2019   CHOLHDL 4.0 09/23/2018   Lab Results  Component Value Date   NA 139 11/04/2019   K 4.2 11/04/2019   CREATININE 0.82 11/04/2019   GFRNONAA 71 11/04/2019   GFRAA 82 11/04/2019   GLUCOSE 140 (H) 11/04/2019     --------------------------------------------------------------------------------------------------- Hypertension, follow-up  BP Readings from Last 3 Encounters:  06/11/20 (!) 131/55  05/21/20 (!) 140/55  11/17/19 140/70   Wt Readings from Last 3 Encounters:  06/11/20 228 lb 3.2 oz (103.5 kg)  05/21/20 229 lb 3.2  oz (104 kg)  11/17/19 226 lb 12.8 oz (102.9 kg)     She was last seen for hypertension 3 months ago.  BP at that visit was 140/55. Management since that visit includes no changes.  She reports excellent compliance with treatment. She is not having side effects.  She is following a Low Sodium diet. She is not exercising. She does not smoke.  Use of agents associated with hypertension: none.   Outside blood pressures are stable. Symptoms: No chest pain No chest pressure  No palpitations No syncope  No dyspnea No orthopnea  No paroxysmal nocturnal dyspnea No lower extremity edema   Pertinent labs: Lab Results  Component Value Date   CHOL 168 11/04/2019   HDL 39 (L) 11/04/2019   LDLCALC 93 11/04/2019   TRIG 212 (H) 11/04/2019   CHOLHDL 4.0 09/23/2018   Lab Results  Component Value Date   NA 139 11/04/2019   K 4.2 11/04/2019   CREATININE 0.82 11/04/2019   GFRNONAA 71 11/04/2019   GFRAA 82 11/04/2019   GLUCOSE 140 (H) 11/04/2019     The 10-year ASCVD risk score Mikey Bussing DC Jr., et al., 2013) is: 31.6%   --------------------------------------------------------------------------------------------------- Lipid/Cholesterol, Follow-up  Last lipid panel Other pertinent labs  Lab Results  Component Value Date   CHOL 168 11/04/2019   HDL 39 (L) 11/04/2019   LDLCALC 93 11/04/2019   TRIG 212 (H) 11/04/2019   CHOLHDL 4.0  09/23/2018   Lab Results  Component Value Date   ALT 20 11/04/2019   AST 24 11/04/2019   PLT 118 (L) 11/04/2019   TSH 1.760 09/23/2018     She was last seen for this 3 months ago.  Management since that visit includes no changes.  She reports excellent compliance with treatment. She is not having side effects.   Symptoms: No chest pain No chest pressure/discomfort  No dyspnea No lower extremity edema  No numbness or tingling of extremity No orthopnea  No palpitations No paroxysmal nocturnal dyspnea  No speech difficulty No syncope   Current diet:  in general, an "unhealthy" diet Current exercise: none  The 10-year ASCVD risk score Mikey Bussing DC Brooke Bonito., et al., 2013) is: 31.6%  ---------------------------------------------------------------------------------------------------  Patient Active Problem List   Diagnosis Date Noted  . Fatty liver 07/18/2019  . Type 2 diabetes mellitus with foot ulcer, without long-term current use of insulin ([place]) 10/04/2018  . 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 MI (mitral incompetence) 07/12/2015  . Paroxysmal atrial fibrillation ([place]) 07/12/2015  . Abdominal pain 05/17/2015  . Strain of tendon of foot and ankle 05/17/2015  . BMI 37.0-37.9, adult 05/17/2015  . Esophagitis, reflux 05/17/2015  . Dizziness 05/17/2015  . Essential (primary) hypertension 05/17/2015  . TI (tricuspid incompetence) 01/11/2015  . H/O malignant neoplasm of skin 02/05/2013  . Essential thrombocythemia ([place]) 10/01/2012  . Blood in the urine 02/24/2010  . History of shingles 09/16/2007   Social History   Tobacco Use  . Smoking status: Never Smoker  . Smokeless tobacco: Never Used  Substance Use Topics  . Alcohol use: No  . Drug use: No   Allergies  Allergen Reactions  . Ibuprofen     Elevates BP if taken continuously  . Meloxicam Swelling  . Multaq [Dronedarone]     Unknown reaction        Medications: Outpatient Medications Prior to Visit  Medication Sig  . ASPIRIN LOW DOSE 81 MG EC tablet TAKE 1 TABLET BY MOUTH EVERY DAY  . Calcium Carbonate-Vitamin D 600-200 MG-UNIT CAPS Take 1 tablet by mouth daily.  . dapagliflozin propanediol (FARXIGA) 10 MG TABS tablet Take 1 tablet (10 mg total) by mouth daily before breakfast.  . hydrochlorothiazide (HYDRODIURIL) 25 MG tablet Take 25 mg by mouth daily.  . metoprolol tartrate (LOPRESSOR) 25 MG tablet Take 12.5 mg by mouth 2 (two) times daily.   . MULTIPLE VITAMIN PO Take 1 tablet by mouth daily.  . Omega-3 Fatty Acids (FISH OIL) 1200 MG CAPS  Take 1,200 mg by mouth daily.  Marland Kitchen omeprazole (PRILOSEC) 20 MG capsule TAKE 1 CAPSULE (20 MG TOTAL) BY MOUTH 2 (TWO) TIMES DAILY BEFORE A MEAL. (Patient taking differently: Take 20 mg by mouth 2 (two) times daily before a meal.)  . simvastatin (ZOCOR) 20 MG tablet TAKE 1 TABLET BY MOUTH DAILY  . zinc gluconate 50 MG tablet Take 50 mg by mouth daily.  . benzonatate (TESSALON) 200 MG capsule Take 1 capsule (200 mg total) by mouth 3 (three) times daily as needed. (Patient not taking: Reported on 08/25/2020)  . lisinopril (PRINIVIL,ZESTRIL) 40 MG tablet Take 40 mg by mouth daily.    No facility-administered medications prior to visit.    Review of Systems  Constitutional: Negative.   Eyes: Negative.   Respiratory: Positive for cough.   Cardiovascular: Negative.   Gastrointestinal: Negative.   Endocrine: Negative.   Neurological: Negative.     Last CBC Lab Results  Component Value Date   WBC 5.7 11/04/2019   HGB 13.2 11/04/2019   HCT 38.4 11/04/2019   MCV 96 11/04/2019   MCH 33.0 11/04/2019   RDW 12.8 11/04/2019   PLT 118 (L) 11/04/2019   Last thyroid functions Lab Results  Component Value Date   TSH 1.760 09/23/2018      Objective    There were no vitals taken for this visit. BP Readings from Last 3 Encounters:  06/11/20 (!) 131/55  05/21/20 (!) 140/55  11/17/19 140/70   Wt Readings from Last 3 Encounters:  06/11/20 228 lb 3.2 oz (103.5 kg)  05/21/20 229 lb 3.2 oz (104 kg)  11/17/19 226 lb 12.8 oz (102.9 kg)      Physical Exam Vitals reviewed.  Constitutional:      General: She is not in acute distress.    Appearance: Normal appearance. She is well-developed and well-nourished. She is obese. She is not ill-appearing or diaphoretic.  Cardiovascular:     Rate and Rhythm: Normal rate and regular rhythm.     Heart sounds: Murmur heard.  No friction rub. No gallop.   Pulmonary:     Effort: Pulmonary effort is normal. No respiratory distress.     Breath sounds:  Normal breath sounds. No wheezing or rales.  Musculoskeletal:     Cervical back: Normal range of motion and neck supple.  Neurological:     Mental Status: She is alert.      Results for orders placed or performed in visit on 08/25/20  POCT glycosylated hemoglobin (Hb A1C)  Result Value Ref Range   Hemoglobin A1C 7.3 (A) 4.0 - 5.6 %   Est. average glucose Bld gHb Est-mCnc 163     Assessment & Plan     1. Type 2 diabetes mellitus with foot ulcer, without long-term current use of insulin (HCC) A1c improved to 7.3 from 7.5. Continue medications as prescribed. On ACE and statin. Has appt in February 2022 for AWV/CPE.   No follow-ups on file.      Delmer Islam, PA-C, have reviewed all documentation for this visit. The documentation on 08/25/20 for the exam, diagnosis, procedures, and orders are all accurate and complete.   Reine Just  [facility] [phone] (phone) [phone] (fax)  [facility]

## 2020-08-24 NOTE — Patient Instructions (Signed)

## 2020-08-25 ENCOUNTER — Ambulatory Visit (INDEPENDENT_AMBULATORY_CARE_PROVIDER_SITE_OTHER): Payer: Medicare Other | Admitting: Physician Assistant

## 2020-08-25 ENCOUNTER — Other Ambulatory Visit: Payer: Self-pay

## 2020-08-25 ENCOUNTER — Encounter: Payer: Self-pay | Admitting: Physician Assistant

## 2020-08-25 DIAGNOSIS — E11621 Type 2 diabetes mellitus with foot ulcer: Secondary | ICD-10-CM | POA: Diagnosis not present

## 2020-08-25 DIAGNOSIS — L97509 Non-pressure chronic ulcer of other part of unspecified foot with unspecified severity: Secondary | ICD-10-CM | POA: Diagnosis not present

## 2020-08-25 LAB — POCT GLYCOSYLATED HEMOGLOBIN (HGB A1C)
Est. average glucose Bld gHb Est-mCnc: 163
Hemoglobin A1C: 7.3 % — AB (ref 4.0–5.6)

## 2020-08-26 ENCOUNTER — Encounter: Payer: Self-pay | Admitting: Physician Assistant

## 2020-08-30 ENCOUNTER — Ambulatory Visit: Payer: Federal, State, Local not specified - PPO | Admitting: Dietician

## 2020-09-01 ENCOUNTER — Encounter: Payer: Self-pay | Admitting: Physician Assistant

## 2020-09-01 DIAGNOSIS — K2100 Gastro-esophageal reflux disease with esophagitis, without bleeding: Secondary | ICD-10-CM

## 2020-09-01 MED ORDER — OMEPRAZOLE 20 MG PO CPDR: 20.0000 mg | DELAYED_RELEASE_CAPSULE | Freq: Every day | ORAL | 1 refills | Status: DC

## 2020-09-17 ENCOUNTER — Ambulatory Visit
Admission: RE | Admit: 2020-09-17 | Discharge: 2020-09-17 | Disposition: A | Discharge status: Home / Self Care | Payer: Medicare Other | Source: Ambulatory Visit | Attending: Physician Assistant | Admitting: Physician Assistant

## 2020-09-17 ENCOUNTER — Other Ambulatory Visit: Payer: Self-pay

## 2020-09-17 DIAGNOSIS — Z1231 Encounter for screening mammogram for malignant neoplasm of breast: Secondary | ICD-10-CM | POA: Diagnosis present

## 2020-09-28 DIAGNOSIS — G4733 Obstructive sleep apnea (adult) (pediatric): Secondary | ICD-10-CM | POA: Insufficient documentation

## 2020-10-04 ENCOUNTER — Other Ambulatory Visit: Payer: Self-pay

## 2020-10-04 ENCOUNTER — Encounter: Payer: Medicare Other | Attending: Physician Assistant | Admitting: Dietician

## 2020-10-04 ENCOUNTER — Encounter: Payer: Self-pay | Admitting: Dietician

## 2020-10-04 VITALS — Ht 65.0 in | Wt 224.1 lb

## 2020-10-04 DIAGNOSIS — L97509 Non-pressure chronic ulcer of other part of unspecified foot with unspecified severity: Secondary | ICD-10-CM | POA: Insufficient documentation

## 2020-10-04 DIAGNOSIS — E11621 Type 2 diabetes mellitus with foot ulcer: Secondary | ICD-10-CM | POA: Insufficient documentation

## 2020-10-04 NOTE — Progress Notes (Signed)
Medical Nutrition Therapy: Visit start time: 1430  end time: 1540  Assessment:  Diagnosis: Type 2 diabetes Past medical history: HTN, HLD, sleep apnea Psychosocial issues/ stress concerns: none  Preferred learning method:  . Auditory . Visual  Current weight: 224.1lbs with shoes  Height: 5'5" Medications, supplements: reconciled list in medical record  Progress and evaluation:   Patient reports DM daignosis about 1 year ago. She attended 2 visits with RN, CDE at that time.   She is testing BGs at home, with [facility] program; average BGs last week was 160.  BGs range from low 100s - 170s.  She has been making changes recently to control carb intake, including trying new low-carb recipes.   Physical activity: walking exercise video, 30 minutes, 4-5 times a week  Dietary Intake:  Usual eating pattern includes 3 meals and 2 snacks per day. Dining out frequency: 2 meals per week.  Breakfast: [place] bacon, nonfat plain yogurt with flour baked into small bagel with peanut or almond 1 Tbsp + fruit, tea with splenda; occ out   Snack: same as pm, or deviled egg with light mayo Lunch: sandwich occ with few chips, trying healthier options ie veggie chips + cheese stck 60kcal; reports BG goes up too much when eating nabs crackers; peanuts. Snack: cheese stick, "dipping sticks " crackers (less carbs); keto yogurt Supper: lasagna + bread + salad (BG higher) Snack: none Beverages: water 8c daily sometimes with added sugar free flavoring; tea with splenda, no sodas  Nutrition Care Education: Topics covered:  Basic nutrition: basic food groups, appropriate nutrient balance, appropriate meal and snack schedule, general nutrition guidelines    Advanced nutrition:  cooking techniques Diabetes:  goals for BGs, appropriate meal and snack schedule, appropriate carb intake and balance, healthy carb choices, role of fiber, protein, fat; role of physical activity, stress, pain, illness on blood  sugars  Nutritional Diagnosis:  [place]-2.2 Altered nutrition-related laboratory As related to type 2 diabetes.  As evidenced by recent HbA1C of 7.5%.  Intervention:  . Instruction and discussion as noted above. . Patient is generally following a healthy and balanced eating pattern, continues to make positive changes, and is motivated to continue. Marland Kitchen Resources provided will provide her with additonal guidance and options to ensure balanced and adequate nutritional intake. Marland Kitchen No follow-up scheduled at this time; patient to schedule later if needed.  Education Materials given:  . Plate Planner with food lists, sample meal pattern . Sample menus . Visit summary with goals/ instructions   Learner/ who was taught:  . Patient   Level of understanding: Marland Kitchen Verbalizes/ demonstrates competency   Demonstrated degree of understanding via:   Teach back Learning barriers: . None  Willingness to learn/ readiness for change: . Eager, change in progress   Monitoring and Evaluation:  Dietary intake, exercise, BG control, and body weight      follow up: prn

## 2020-10-04 NOTE — Patient Instructions (Signed)
   Great job making healthy food choices and controlling portions! Keep it up!  Goals for blood sugars are 80-130 fasting, 90-180 2 hours after meals, average overall 150-170.   Continue with regular exercise, great job!

## 2020-10-20 ENCOUNTER — Encounter: Payer: Self-pay | Admitting: Physician Assistant

## 2020-10-21 ENCOUNTER — Other Ambulatory Visit: Payer: Self-pay

## 2020-10-21 MED ORDER — ASPIRIN 81 MG PO TBEC
81.0000 mg | DELAYED_RELEASE_TABLET | Freq: Every day | ORAL | 3 refills | Status: DC
Start: 2020-10-21 — End: 2021-10-19

## 2020-10-25 ENCOUNTER — Encounter: Payer: Self-pay | Admitting: Physician Assistant

## 2020-11-04 ENCOUNTER — Telehealth: Payer: Self-pay | Admitting: Physician Assistant

## 2020-11-04 NOTE — Telephone Encounter (Signed)
Copied from [place] [id number]. Topic: Medicare AWV >> Nov 04, 2020  1:58 PM Cher Nakai R wrote: Reason for CRM:  Left message for patient to call back and schedule Medicare Annual Wellness Visit (AWV) in office.   If not able to come in office, please offer to do virtually or by telephone.   Last AWV:11/13/2019  Please schedule at anytime with [facility] Health Advisor.  If any questions, please contact me at [phone]

## 2020-11-07 ENCOUNTER — Encounter: Payer: Self-pay | Admitting: Physician Assistant

## 2020-11-07 DIAGNOSIS — D473 Essential (hemorrhagic) thrombocythemia: Secondary | ICD-10-CM

## 2020-11-07 DIAGNOSIS — E11621 Type 2 diabetes mellitus with foot ulcer: Secondary | ICD-10-CM

## 2020-11-07 DIAGNOSIS — Z6837 Body mass index (BMI) 37.0-37.9, adult: Secondary | ICD-10-CM

## 2020-11-07 DIAGNOSIS — K760 Fatty (change of) liver, not elsewhere classified: Secondary | ICD-10-CM

## 2020-11-07 DIAGNOSIS — E782 Mixed hyperlipidemia: Secondary | ICD-10-CM

## 2020-11-07 DIAGNOSIS — I10 Essential (primary) hypertension: Secondary | ICD-10-CM

## 2020-11-12 LAB — COMPREHENSIVE METABOLIC PANEL
ALT: 18 IU/L (ref 0–32)
AST: 25 IU/L (ref 0–40)
Albumin/Globulin Ratio: 1.9 (ref 1.2–2.2)
Albumin: 4.4 g/dL (ref 3.7–4.7)
Alkaline Phosphatase: 66 IU/L (ref 44–121)
BUN/Creatinine Ratio: 30 — ABNORMAL HIGH (ref 12–28)
BUN: 30 mg/dL — ABNORMAL HIGH (ref 8–27)
Bilirubin Total: 0.8 mg/dL (ref 0.0–1.2)
CO2: 20 mmol/L (ref 20–29)
Calcium: 9.6 mg/dL (ref 8.7–10.3)
Chloride: 100 mmol/L (ref 96–106)
Creatinine, Ser: 0.99 mg/dL (ref 0.57–1.00)
Globulin, Total: 2.3 g/dL (ref 1.5–4.5)
Glucose: 171 mg/dL — ABNORMAL HIGH (ref 65–99)
Potassium: 4.4 mmol/L (ref 3.5–5.2)
Sodium: 139 mmol/L (ref 134–144)
Total Protein: 6.7 g/dL (ref 6.0–8.5)
eGFR: 60 mL/min/{1.73_m2} (ref >59)

## 2020-11-12 LAB — CBC WITH DIFFERENTIAL/PLATELET
Basophils Absolute: 0 10*3/uL (ref 0.0–0.2)
Basos: 1 %
EOS (ABSOLUTE): 0.1 10*3/uL (ref 0.0–0.4)
Eos: 2 %
Hematocrit: 40.8 % (ref 34.0–46.6)
Hemoglobin: 14 g/dL (ref 11.1–15.9)
Immature Grans (Abs): 0 10*3/uL (ref 0.0–0.1)
Immature Granulocytes: 0 %
Lymphocytes Absolute: 1.7 10*3/uL (ref 0.7–3.1)
Lymphs: 29 %
MCH: 32.9 pg (ref 26.6–33.0)
MCHC: 34.3 g/dL (ref 31.5–35.7)
MCV: 96 fL (ref 79–97)
Monocytes Absolute: 0.6 10*3/uL (ref 0.1–0.9)
Monocytes: 11 %
Neutrophils Absolute: 3.5 10*3/uL (ref 1.4–7.0)
Neutrophils: 57 %
Platelets: 117 10*3/uL — ABNORMAL LOW (ref 150–450)
RBC: 4.26 x10E6/uL (ref 3.77–5.28)
RDW: 13.3 % (ref 11.7–15.4)
WBC: 6 10*3/uL (ref 3.4–10.8)

## 2020-11-12 LAB — LIPID PANEL WITH LDL/HDL RATIO
Cholesterol, Total: 164 mg/dL (ref 100–199)
HDL: 34 mg/dL — ABNORMAL LOW (ref >39)
LDL Chol Calc (NIH): 96 mg/dL (ref 0–99)
LDL/HDL Ratio: 2.8 ratio (ref 0.0–3.2)
Triglycerides: 194 mg/dL — ABNORMAL HIGH (ref 0–149)
VLDL Cholesterol Cal: 34 mg/dL (ref 5–40)

## 2020-11-12 LAB — HEMOGLOBIN A1C
Est. average glucose Bld gHb Est-mCnc: 169 mg/dL
Hgb A1c MFr Bld: 7.5 % — ABNORMAL HIGH (ref 4.8–5.6)

## 2020-11-12 LAB — TSH: TSH: 2.07 u[IU]/mL (ref 0.450–4.500)

## 2020-11-15 NOTE — Progress Notes (Signed)
Subjective:   Stacie Porter is a 75 y.o. female who presents for Medicare Annual (Subsequent) preventive examination.  I connected with Stacie Porter today by telephone and verified that I am speaking with the correct person using two identifiers. Location patient: home Location provider: work Persons participating in the virtual visit: patient, provider.   I discussed the limitations, risks, security and privacy concerns of performing an evaluation and management service by telephone and the availability of in person appointments. I also discussed with the patient that there may be a patient responsible charge related to this service. The patient expressed understanding and verbally consented to this telephonic visit.    Interactive audio and video telecommunications were attempted between this provider and patient, however failed, due to patient having technical difficulties OR patient did not have access to video capability.  We continued and completed visit with audio only.   Review of Systems    N/A  Cardiac Risk Factors include: advanced age (>39men, >31 women);diabetes mellitus;dyslipidemia;hypertension;obesity (BMI >30kg/m2)     Objective:    There were no vitals filed for this visit. There is no height or weight on file to calculate BMI.  Advanced Directives 11/16/2020 10/04/2020 11/13/2019 08/12/2019 11/07/2018 11/04/2018 10/04/2018  Does Patient Have a Medical Advance Directive? Yes Yes Yes Yes Yes Yes Yes  Type of Paramedic of [place];Living will [place];Living will [place];Living will [place];Living will [place];Living will [place];Living will [place];Living will  Does patient want to make changes to medical advance directive? - No - Patient declined - - - No - Patient declined -  Copy of [place] in Chart? Yes -  validated most recent copy scanned in chart (See row information) (No Data) No - copy requested No - copy requested No - copy requested - No - copy requested  Would patient like information on creating a medical advance directive? - - - - - - -    Current Medications (verified) Outpatient Encounter Medications as of 11/16/2020  Medication Sig  . aspirin (ASPIRIN LOW DOSE) 81 MG EC tablet Take 1 tablet (81 mg total) by mouth daily. Swallow whole.  . Calcium Carbonate-Vitamin D 600-200 MG-UNIT CAPS Take 1 tablet by mouth daily.  . dapagliflozin propanediol (FARXIGA) 10 MG TABS tablet Take 1 tablet (10 mg total) by mouth daily before breakfast.  . hydrochlorothiazide (HYDRODIURIL) 25 MG tablet Take 25 mg by mouth daily.  Marland Kitchen lisinopril (PRINIVIL,ZESTRIL) 40 MG tablet Take 40 mg by mouth daily.   . metoprolol tartrate (LOPRESSOR) 25 MG tablet Take 12.5 mg by mouth 2 (two) times daily.   . MULTIPLE VITAMIN PO Take 1 tablet by mouth daily.  . Omega-3 Fatty Acids (FISH OIL) 1200 MG CAPS Take 1,200 mg by mouth daily.  Marland Kitchen omeprazole (PRILOSEC) 20 MG capsule Take 1 capsule (20 mg total) by mouth daily.  . simvastatin (ZOCOR) 40 MG tablet Take 40 mg by mouth at bedtime.  Marland Kitchen zinc gluconate 50 MG tablet Take 50 mg by mouth daily.  . benzonatate (TESSALON) 200 MG capsule Take 1 capsule (200 mg total) by mouth 3 (three) times daily as needed. (Patient not taking: No sig reported)  . simvastatin (ZOCOR) 20 MG tablet TAKE 1 TABLET BY MOUTH DAILY (Patient not taking: Reported on 11/16/2020)   No facility-administered encounter medications on file as of 11/16/2020.    Allergies (verified) Ibuprofen, Meloxicam, and  Multaq [dronedarone]   History: Past Medical History:  Diagnosis Date  . Diabetes mellitus without complication ([place])    type 2  . Dysrhythmia    a fib  . Hx of basal cell carcinoma ~2014 Mohs   Nose  . Hyperlipidemia   . Hypertension   . Osteomyelitis of second toe of right foot ([place])  11/11/2018  . Sleep apnea    cpap   Past Surgical History:  Procedure Laterality Date  . AMPUTATION TOE Right 11/11/2018   Procedure: AMPUTATION RIGHT 2ND TOE;  Surgeon: Dorna Leitz, MD;  Location: [facility];  Service: Orthopedics;  Laterality: Right;  . APPENDECTOMY    . CARDIAC ELECTROPHYSIOLOGY STUDY AND ABLATION    . cather ablation  2011/2014   cardiac ablation for a fib  . EYE SURGERY Bilateral 05/2014   cataract  . HAMMER TOE SURGERY Right   . HAND SURGERY Bilateral   . LAPAROSCOPIC OOPHERECTOMY    . recession of gums sx    . TUBAL LIGATION     Family History  Problem Relation Age of Onset  . Ovarian cancer Sister   . Cancer Sister        ovarian  . Lung cancer Sister   . Cancer Sister        lung and thyroid  . Thyroid cancer Sister   . Heart disease Mother   . Heart disease Father   . Hypertension Father   . Diabetes Maternal Uncle   . Diabetes Maternal Uncle   . Breast cancer Neg Hx    Social History   Socioeconomic History  . Marital status: Married    Spouse name: Not on file  . Number of children: 3  . Years of education: Not on file  . Highest education level: Some college, no degree  Occupational History  . Occupation: retired    Comment: part time subsitute   Tobacco Use  . Smoking status: Never Smoker  . Smokeless tobacco: Never Used  Substance and Sexual Activity  . Alcohol use: No  . Drug use: No  . Sexual activity: Yes  Other Topics Concern  . Not on file  Social History Narrative  . Not on file   Social Determinants of Health   Financial Resource Strain: Low Risk   . Difficulty of Paying Living Expenses: Not hard at all  Food Insecurity: No Food Insecurity  . Worried About Charity fundraiser in the Last Year: Never true  . Ran Out of Food in the Last Year: Never true  Transportation Needs: No Transportation Needs  . Lack of Transportation (Medical): No  . Lack of Transportation (Non-Medical): No  Physical Activity: Inactive  .  Days of Exercise per Week: 0 days  . Minutes of Exercise per Session: 0 min  Stress: No Stress Concern Present  . Feeling of Stress : Not at all  Social Connections: Socially Integrated  . Frequency of Communication with Friends and Family: More than three times a week  . Frequency of Social Gatherings with Friends and Family: More than three times a week  . Attends Religious Services: More than 4 times per year  . Active Member of Clubs or Organizations: Yes  . Attends Archivist Meetings: More than 4 times per year  . Marital Status: Married    Tobacco Counseling Counseling given: Not Answered   Clinical Intake:  Pre-visit preparation completed: Yes  Pain : No/denies pain     Nutritional Risks: None Diabetes: Yes  How often do you need to have someone help you when you read instructions, pamphlets, or other written materials from your doctor or pharmacy?: 1 - Never  Diabetic? Yes  Nutrition Risk Assessment:  Has the patient had any N/V/D within the last 2 months?  No  Does the patient have any non-healing wounds?  No  Has the patient had any unintentional weight loss or weight gain?  No   Diabetes:  Is the patient diabetic?  Yes  If diabetic, was a CBG obtained today?  No  Did the patient bring in their glucometer from home?  No  How often do you monitor your CBG's? Several times a week.   Financial Strains and Diabetes Management:  Are you having any financial strains with the device, your supplies or your medication? No .  Does the patient want to be seen by Chronic Care Management for management of their diabetes?  No  Would the patient like to be referred to a Nutritionist or for Diabetic Management?  No   Diabetic Exams:  Diabetic Eye Exam: Completed 03/29/20 Diabetic Foot Exam: Overdue, Pt has been advised about the importance in completing this exam.    Interpreter Needed?: No  Information entered by :: [facility], LPN   Activities of  Daily Living In your present state of health, do you have any difficulty performing the following activities: 11/16/2020 05/21/2020  Hearing? Y Y  Comment Does not wear hearing aids. sometimes  Vision? N N  Difficulty concentrating or making decisions? N N  Walking or climbing stairs? N N  Dressing or bathing? N N  Doing errands, shopping? N N  Preparing Food and eating ? N -  Using the Toilet? N -  In the past six months, have you accidently leaked urine? Y -  Comment Occasionally due to frequency. -  Do you have problems with loss of bowel control? N -  Managing your Medications? N -  Managing your Finances? N -  Housekeeping or managing your Housekeeping? N -  Some recent data might be hidden    Patient Care Team: Mar Daring, PA-C as PCP - General (Family Medicine) Lorelee Cover., MD (Ophthalmology) Corey Skains, MD as Consulting Physician (Cardiology) Ralene Bathe, MD (Dermatology) Samara Deist, DPM as Referring Physician (Podiatry) Georgina Peer, RD as Dietitian (Dietician)  Indicate any recent Medical Services you may have received from other than [facility] providers in the past year (date may be approximate).     Assessment:   This is a routine wellness examination for [place].  Hearing/Vision screen No exam data present  Dietary issues and exercise activities discussed: Current Exercise Habits: Home exercise routine, Type of exercise: walking, Time (Minutes): 30, Frequency (Times/Week): 3, Weekly Exercise (Minutes/Week): 90, Intensity: Mild, Exercise limited by: None identified  Goals    . DIET - REDUCE CALORIE INTAKE     Continue current diabetic diet changes to help aid with diabetes and weight loss.     . Exercise 3x per week (30 min per time)     Recommend to exercise for 3 days a week for at least 30 minutes at a time.       Depression Screen PHQ 2/9 Scores 11/16/2020 10/04/2020 05/21/2020 08/12/2019 11/04/2018 10/04/2018 10/04/2018  PHQ - 2  Score 0 0 0 0 0 0 0  PHQ- 9 Score - - - - - 0 -    Fall Risk Fall Risk  11/16/2020 10/04/2020 05/21/2020 11/13/2019 08/12/2019  Falls in  the past year? 0 0 0 0 0  Number falls in past yr: 0 - 0 0 -  Injury with Fall? 0 - 0 0 -  Risk for fall due to : - - No Fall Risks - -  Follow up - - Falls evaluation completed - -    FALL RISK PREVENTION PERTAINING TO THE HOME:  Any stairs in or around the home? No  If so, are there any without handrails? No  Home free of loose throw rugs in walkways, pet beds, electrical cords, etc? Yes  Adequate lighting in your home to reduce risk of falls? Yes   ASSISTIVE DEVICES UTILIZED TO PREVENT FALLS:  Life alert? No  Use of a cane, walker or w/c? No  Grab bars in the bathroom? Yes  Shower chair or bench in shower? Yes  Elevated toilet seat or a handicapped toilet? Yes   Cognitive Function: Normal cognitive status assessed by observation by this Nurse Health Advisor. No abnormalities found.          Immunizations Immunization History  Administered Date(s) Administered  . Fluad Quad(high Dose 65+) 05/21/2020  . Influenza Split 06/23/2006, 05/29/2009, 06/15/2010  . Influenza, High Dose Seasonal PF 06/24/2014, 06/22/2017  . Influenza,inj,Quad PF,6+ Mos 06/29/2015  . Influenza-Unspecified 05/01/2018, 05/05/2019  . Moderna Sars-Covid-2 Vaccination 10/09/2019, 11/06/2019  . Pneumococcal Conjugate-13 09/23/2014  . Pneumococcal Polysaccharide-23 07/01/2012  . Td 08/28/1997, 03/20/2008  . Tdap 09/23/2014    TDAP status: Up to date  Flu Vaccine status: Up to date  Pneumococcal vaccine status: Up to date  Covid-19 vaccine status: Completed vaccines  Qualifies for Shingles Vaccine? Yes   Zostavax completed No   Shingrix Completed?: No.    Education has been provided regarding the importance of this vaccine. Patient has been advised to call insurance company to determine out of pocket expense if they have not yet received this vaccine. Advised may  also receive vaccine at local pharmacy or Health Dept. Verbalized acceptance and understanding.  Screening Tests Health Maintenance  Topic Date Due  . COVID-19 Vaccine (3 - Moderna risk 4-dose series) 12/04/2019  . COLONOSCOPY (Pts 45-46yrs Insurance coverage will need to be confirmed)  10/11/2020  . FOOT EXAM  11/16/2020  . OPHTHALMOLOGY EXAM  03/29/2021  . HEMOGLOBIN A1C  05/14/2021  . DEXA SCAN  07/04/2022  . MAMMOGRAM  09/17/2022  . TETANUS/TDAP  09/23/2024  . INFLUENZA VACCINE  Completed  . Hepatitis C Screening  Completed  . PNA vac Low Risk Adult  Completed  . HPV VACCINES  Aged Out    Health Maintenance  Health Maintenance Due  Topic Date Due  . COVID-19 Vaccine (3 - Moderna risk 4-dose series) 12/04/2019  . COLONOSCOPY (Pts 45-21yrs Insurance coverage will need to be confirmed)  10/11/2020  . FOOT EXAM  11/16/2020    Colorectal cancer screening: Referral to GI placed today. Pt aware the office will call re: appt.  Mammogram status: Completed 09/17/20. Repeat every year  Bone Density status: Completed 07/04/12. Previous DEXA scan was normal. No repeat needed unless advised by a physician.  Lung Cancer Screening: (Low Dose CT Chest recommended if Age 62-80 years, 30 pack-year currently smoking OR have quit w/in 15years.) does not qualify.   Additional Screening:  Hepatitis C Screening: Up to date  Vision Screening: Recommended annual ophthalmology exams for early detection of glaucoma and other disorders of the eye. Is the patient up to date with their annual eye exam?  Yes  Who is the provider  or what is the name of the office in which the patient attends annual eye exams? Dr Gloriann Loan If pt is not established with a provider, would they like to be referred to a provider to establish care? No .   Dental Screening: Recommended annual dental exams for proper oral hygiene  Community Resource Referral / Chronic Care Management: CRR required this visit?  No   CCM  required this visit?  No      Plan:     I have personally reviewed and noted the following in the patient's chart:   . Medical and social history . Use of alcohol, tobacco or illicit drugs  . Current medications and supplements . Functional ability and status . Nutritional status . Physical activity . Advanced directives . List of other physicians . Hospitalizations, surgeries, and ER visits in previous 12 months . Vitals . Screenings to include cognitive, depression, and falls . Referrals and appointments  In addition, I have reviewed and discussed with patient certain preventive protocols, quality metrics, and best practice recommendations. A written personalized care plan for preventive services as well as general preventive health recommendations were provided to patient.     Charitie Hinote [place], [place]   [id number]   Nurse Notes: Declined receiving a Covid booster.

## 2020-11-16 ENCOUNTER — Ambulatory Visit (INDEPENDENT_AMBULATORY_CARE_PROVIDER_SITE_OTHER): Payer: Medicare Other

## 2020-11-16 ENCOUNTER — Other Ambulatory Visit: Payer: Self-pay

## 2020-11-16 DIAGNOSIS — Z0000 Encounter for general adult medical examination without abnormal findings: Secondary | ICD-10-CM

## 2020-11-16 DIAGNOSIS — Z1211 Encounter for screening for malignant neoplasm of colon: Secondary | ICD-10-CM | POA: Diagnosis not present

## 2020-11-16 NOTE — Patient Instructions (Signed)
[name] , Thank you for taking time to come for your Medicare Wellness Visit. I appreciate your ongoing commitment to your health goals. Please review the following plan we discussed and let me know if I can assist you in the future.   Screening recommendations/referrals: Colonoscopy: Referral to GI placed today. Pt aware office well contact her re: appt. Mammogram: Up to date, due 08/2021 Bone Density: Previous DEXA scan was normal. No repeat needed unless advised by a physician. Recommended yearly ophthalmology/optometry visit for glaucoma screening and checkup Recommended yearly dental visit for hygiene and checkup  Vaccinations: Influenza vaccine: Done 05/21/20 Pneumococcal vaccine: Completed series Tdap vaccine: Up to date, due 08/2024 Shingles vaccine: Shingrix discussed. Please contact your pharmacy for coverage information.     Advanced directives: Currently on file.  Conditions/risks identified: Continue current diabetic diet changes to help aid with diabetes and weight loss. Recommend to start exercising 3 days a week for 30 minutes at a time.   Next appointment: 11/17/20 @ 9:00 with Stacie Porter 74 Years and Older, Female Preventive care refers to lifestyle choices and visits with your health care provider that can promote health and wellness. What does preventive care include?  A yearly physical exam. This is also called an annual well check.  Dental exams once or twice a year.  Routine eye exams. Ask your health care provider how often you should have your eyes checked.  Personal lifestyle choices, including:  Daily care of your teeth and gums.  Regular physical activity.  Eating a healthy diet.  Avoiding tobacco and drug use.  Limiting alcohol use.  Practicing safe sex.  Taking low-dose aspirin every day.  Taking vitamin and mineral supplements as recommended by your health care provider. What happens during an annual well  check? The services and screenings done by your health care provider during your annual well check will depend on your age, overall health, lifestyle risk factors, and family history of disease. Counseling  Your health care provider may ask you questions about your:  Alcohol use.  Tobacco use.  Drug use.  Emotional well-being.  Home and relationship well-being.  Sexual activity.  Eating habits.  History of falls.  Memory and ability to understand (cognition).  Work and work Statistician.  Reproductive health. Screening  You may have the following tests or measurements:  Height, weight, and BMI.  Blood pressure.  Lipid and cholesterol levels. These may be checked every 5 years, or more frequently if you are over 74 years old.  Skin check.  Lung cancer screening. You may have this screening every year starting at age 74 if you have a 30-pack-year history of smoking and currently smoke or have quit within the past 15 years.  Fecal occult blood test (FOBT) of the stool. You may have this test every year starting at age 74.  Flexible sigmoidoscopy or colonoscopy. You may have a sigmoidoscopy every 5 years or a colonoscopy every 10 years starting at age 74.  Hepatitis C blood test.  Hepatitis B blood test.  Sexually transmitted disease (STD) testing.  Diabetes screening. This is done by checking your blood sugar (glucose) after you have not eaten for a while (fasting). You may have this done every 1-3 years.  Bone density scan. This is done to screen for osteoporosis. You may have this done starting at age 74.  Mammogram. This may be done every 1-2 years. Talk to your health care provider about how often you should  have regular mammograms. Talk with your health care provider about your test results, treatment options, and if necessary, the need for more tests. Vaccines  Your health care provider may recommend certain vaccines, such as:  Influenza vaccine. This is  recommended every year.  Tetanus, diphtheria, and acellular pertussis (Tdap, Td) vaccine. You may need a Td booster every 10 years.  Zoster vaccine. You may need this after age 74.  Pneumococcal 13-valent conjugate (PCV13) vaccine. One dose is recommended after age 74.  Pneumococcal polysaccharide (PPSV23) vaccine. One dose is recommended after age 74. Talk to your health care provider about which screenings and vaccines you need and how often you need them. This information is not intended to replace advice given to you by your health care provider. Make sure you discuss any questions you have with your health care provider. Document Released: 09/10/2015 Document Revised: 05/03/2016 Document Reviewed: 06/15/2015 Elsevier Interactive Patient Education  2017 [place] Prevention in the Home Falls can cause injuries. They can happen to people of all ages. There are many things you can do to make your home safe and to help prevent falls. What can I do on the outside of my home?  Regularly fix the edges of walkways and driveways and fix any cracks.  Remove anything that might make you trip as you walk through a door, such as a raised step or threshold.  Trim any bushes or trees on the path to your home.  Use bright outdoor lighting.  Clear any walking paths of anything that might make someone trip, such as rocks or tools.  Regularly check to see if handrails are loose or broken. Make sure that both sides of any steps have handrails.  Any raised decks and porches should have guardrails on the edges.  Have any leaves, snow, or ice cleared regularly.  Use sand or salt on walking paths during winter.  Clean up any spills in your garage right away. This includes oil or grease spills. What can I do in the bathroom?  Use night lights.  Install grab bars by the toilet and in the tub and shower. Do not use towel bars as grab bars.  Use non-skid mats or decals in the tub or  shower.  If you need to sit down in the shower, use a plastic, non-slip stool.  Keep the floor dry. Clean up any water that spills on the floor as soon as it happens.  Remove soap buildup in the tub or shower regularly.  Attach bath mats securely with double-sided non-slip rug tape.  Do not have throw rugs and other things on the floor that can make you trip. What can I do in the bedroom?  Use night lights.  Make sure that you have a light by your bed that is easy to reach.  Do not use any sheets or blankets that are too big for your bed. They should not hang down onto the floor.  Have a firm chair that has side arms. You can use this for support while you get dressed.  Do not have throw rugs and other things on the floor that can make you trip. What can I do in the kitchen?  Clean up any spills right away.  Avoid walking on wet floors.  Keep items that you use a lot in easy-to-reach places.  If you need to reach something above you, use a strong step stool that has a grab bar.  Keep electrical cords out of  the way.  Do not use floor polish or wax that makes floors slippery. If you must use wax, use non-skid floor wax.  Do not have throw rugs and other things on the floor that can make you trip. What can I do with my stairs?  Do not leave any items on the stairs.  Make sure that there are handrails on both sides of the stairs and use them. Fix handrails that are broken or loose. Make sure that handrails are as long as the stairways.  Check any carpeting to make sure that it is firmly attached to the stairs. Fix any carpet that is loose or worn.  Avoid having throw rugs at the top or bottom of the stairs. If you do have throw rugs, attach them to the floor with carpet tape.  Make sure that you have a light switch at the top of the stairs and the bottom of the stairs. If you do not have them, ask someone to add them for you. What else can I do to help prevent  falls?  Wear shoes that:  Do not have high heels.  Have rubber bottoms.  Are comfortable and fit you well.  Are closed at the toe. Do not wear sandals.  If you use a stepladder:  Make sure that it is fully opened. Do not climb a closed stepladder.  Make sure that both sides of the stepladder are locked into place.  Ask someone to hold it for you, if possible.  Clearly mark and make sure that you can see:  Any grab bars or handrails.  First and last steps.  Where the edge of each step is.  Use tools that help you move around (mobility aids) if they are needed. These include:  Canes.  Walkers.  Scooters.  Crutches.  Turn on the lights when you go into a dark area. Replace any light bulbs as soon as they burn out.  Set up your furniture so you have a clear path. Avoid moving your furniture around.  If any of your floors are uneven, fix them.  If there are any pets around you, be aware of where they are.  Review your medicines with your doctor. Some medicines can make you feel dizzy. This can increase your chance of falling. Ask your doctor what other things that you can do to help prevent falls. This information is not intended to replace advice given to you by your health care provider. Make sure you discuss any questions you have with your health care provider. Document Released: 06/10/2009 Document Revised: 01/20/2016 Document Reviewed: 09/18/2014 Elsevier Interactive Patient Education  2017 [employer].

## 2020-11-17 ENCOUNTER — Ambulatory Visit (INDEPENDENT_AMBULATORY_CARE_PROVIDER_SITE_OTHER): Payer: Medicare Other | Admitting: Physician Assistant

## 2020-11-17 ENCOUNTER — Encounter: Payer: Self-pay | Admitting: Physician Assistant

## 2020-11-17 ENCOUNTER — Other Ambulatory Visit: Payer: Self-pay

## 2020-11-17 VITALS — BP 143/78 | HR 57 | Temp 98.0°F | Wt 225.0 lb

## 2020-11-17 DIAGNOSIS — D473 Essential (hemorrhagic) thrombocythemia: Secondary | ICD-10-CM

## 2020-11-17 DIAGNOSIS — E11621 Type 2 diabetes mellitus with foot ulcer: Secondary | ICD-10-CM | POA: Diagnosis not present

## 2020-11-17 DIAGNOSIS — L97509 Non-pressure chronic ulcer of other part of unspecified foot with unspecified severity: Secondary | ICD-10-CM

## 2020-11-17 DIAGNOSIS — I480 Paroxysmal atrial fibrillation: Secondary | ICD-10-CM

## 2020-11-17 DIAGNOSIS — Z1211 Encounter for screening for malignant neoplasm of colon: Secondary | ICD-10-CM

## 2020-11-17 DIAGNOSIS — I10 Essential (primary) hypertension: Secondary | ICD-10-CM

## 2020-11-17 DIAGNOSIS — K760 Fatty (change of) liver, not elsewhere classified: Secondary | ICD-10-CM | POA: Diagnosis not present

## 2020-11-17 DIAGNOSIS — Z6837 Body mass index (BMI) 37.0-37.9, adult: Secondary | ICD-10-CM

## 2020-11-17 DIAGNOSIS — E782 Mixed hyperlipidemia: Secondary | ICD-10-CM

## 2020-11-17 NOTE — Progress Notes (Signed)
Patient: Stacie Porter, Female    DOB: May 29, 1946, 75 y.o.   MRN: [id number] Visit Date: 11/17/2020  Today's Provider: Mar Daring, PA-C   No chief complaint on file.  Subjective    Stacie Porter is a 75 y.o. female who presents today for her  Follow up of chronic illness. She reports consuming a general diet. Exercises some. She generally feels well. She reports sleeping well. She does not have additional problems to discuss today.   HPI   Patient Active Problem List   Diagnosis Date Noted  . Fatty liver 07/18/2019  . Type 2 diabetes mellitus with foot ulcer, without long-term current use of insulin ([place]) 10/04/2018  . 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 MI (mitral incompetence) 07/12/2015  . Paroxysmal atrial fibrillation ([place]) 07/12/2015  . Abdominal pain 05/17/2015  . Strain of tendon of foot and ankle 05/17/2015  . BMI 37.0-37.9, adult 05/17/2015  . Esophagitis, reflux 05/17/2015  . Dizziness 05/17/2015  . Essential (primary) hypertension 05/17/2015  . TI (tricuspid incompetence) 01/11/2015  . H/O malignant neoplasm of skin 02/05/2013  . Essential thrombocythemia ([place]) 10/01/2012  . Blood in the urine 02/24/2010  . History of shingles 09/16/2007   Past Medical History:  Diagnosis Date  . Diabetes mellitus without complication ([place])    type 2  . Dysrhythmia    a fib  . Hx of basal cell carcinoma ~2014 Mohs   Nose  . Hyperlipidemia   . Hypertension   . Osteomyelitis of second toe of right foot ([place]) 11/11/2018  . Sleep apnea    cpap   Social History   Tobacco Use  . Smoking status: Never Smoker  . Smokeless tobacco: Never Used  Substance Use Topics  . Alcohol use: No  . Drug use: No   Allergies  Allergen Reactions  . Ibuprofen     Elevates BP if taken continuously  . Meloxicam Swelling  . Multaq [Dronedarone]     Unknown reaction      Medications: Outpatient Medications Prior to Visit  Medication Sig   . aspirin (ASPIRIN LOW DOSE) 81 MG EC tablet Take 1 tablet (81 mg total) by mouth daily. Swallow whole.  . Calcium Carbonate-Vitamin D 600-200 MG-UNIT CAPS Take 1 tablet by mouth daily.  . dapagliflozin propanediol (FARXIGA) 10 MG TABS tablet Take 1 tablet (10 mg total) by mouth daily before breakfast.  . hydrochlorothiazide (HYDRODIURIL) 25 MG tablet Take 25 mg by mouth daily.  . metoprolol tartrate (LOPRESSOR) 25 MG tablet Take 12.5 mg by mouth 2 (two) times daily.   . MULTIPLE VITAMIN PO Take 1 tablet by mouth daily.  . Omega-3 Fatty Acids (FISH OIL) 1200 MG CAPS Take 1,200 mg by mouth daily.  Marland Kitchen omeprazole (PRILOSEC) 20 MG capsule Take 1 capsule (20 mg total) by mouth daily.  . simvastatin (ZOCOR) 40 MG tablet Take 40 mg by mouth at bedtime.  Marland Kitchen zinc gluconate 50 MG tablet Take 50 mg by mouth daily.  . benzonatate (TESSALON) 200 MG capsule Take 1 capsule (200 mg total) by mouth 3 (three) times daily as needed.  Marland Kitchen lisinopril (PRINIVIL,ZESTRIL) 40 MG tablet Take 40 mg by mouth daily.   . simvastatin (ZOCOR) 20 MG tablet TAKE 1 TABLET BY MOUTH DAILY   No facility-administered medications prior to visit.    Allergies  Allergen Reactions  . Ibuprofen     Elevates BP if taken continuously  . Meloxicam Swelling  .  Multaq [Dronedarone]     Unknown reaction     Patient Care Team: Rubye Beach as PCP - General (Family Medicine) Lorelee Cover., MD (Ophthalmology) Corey Skains, MD as Consulting Physician (Cardiology) Ralene Bathe, MD (Dermatology) Samara Deist, DPM as Referring Physician (Podiatry) Georgina Peer, RD as Dietitian (Dietician)  Review of Systems  Constitutional: Negative.   Respiratory: Negative.   Cardiovascular: Negative.   Endocrine: Negative.   Musculoskeletal: Negative.   Neurological: Negative.       Objective    Vitals: BP (!) 143/78 (BP Location: Right Arm, Patient Position: Sitting, Cuff Size: Large)   Pulse (!) 57   Temp 98  F (36.7 C) (Oral)   Wt 225 lb (102.1 kg)   BMI 37.44 kg/m    Physical Exam Vitals reviewed.  Constitutional:      General: She is not in acute distress.    Appearance: Normal appearance. She is well-developed. She is obese. She is not diaphoretic.  HENT:     Head: Normocephalic and atraumatic.  Cardiovascular:     Rate and Rhythm: Normal rate and regular rhythm.     Heart sounds: Normal heart sounds. No murmur heard. No friction rub. No gallop.   Pulmonary:     Effort: Pulmonary effort is normal. No respiratory distress.     Breath sounds: Normal breath sounds. No wheezing or rales.  Musculoskeletal:     Cervical back: Normal range of motion and neck supple.     Right lower leg: No edema.     Left lower leg: No edema.  Neurological:     Mental Status: She is alert.     Most recent functional status assessment: In your present state of health, do you have any difficulty performing the following activities: 11/16/2020  Hearing? Y  Comment Does not wear hearing aids.  Vision? N  Difficulty concentrating or making decisions? N  Walking or climbing stairs? N  Dressing or bathing? N  Doing errands, shopping? N  Preparing Food and eating ? N  Using the Toilet? N  In the past six months, have you accidently leaked urine? Y  Comment Occasionally due to frequency.  Do you have problems with loss of bowel control? N  Managing your Medications? N  Managing your Finances? N  Housekeeping or managing your Housekeeping? N  Some recent data might be hidden   Most recent fall risk assessment: Fall Risk  11/16/2020  Falls in the past year? 0  Number falls in past yr: 0  Injury with Fall? 0  Risk for fall due to : -  Follow up -    Most recent depression screenings: PHQ 2/9 Scores 11/17/2020 11/16/2020  PHQ - 2 Score - 0  PHQ- 9 Score - -  Exception Documentation Patient refusal -   Most recent cognitive screening: No flowsheet data found. Most recent Audit-C alcohol use  screening Alcohol Use Disorder Test (AUDIT) 11/17/2020  Patient refused Alcohol Screening Tool Yes  1. How often do you have a drink containing alcohol? -  2. How many drinks containing alcohol do you have on a typical day when you are drinking? -  3. How often do you have six or more drinks on one occasion? -  AUDIT-C Score -  Alcohol Brief Interventions/Follow-up -   A score of 3 or more in women, and 4 or more in men indicates increased risk for alcohol abuse, EXCEPT if all of the points are from question 1  No results found for any visits on 11/17/20.  Assessment & Plan     Annual wellness visit done today including the all of the following: Reviewed patient's Family Medical History Reviewed and updated list of patient's medical providers Assessment of cognitive impairment was done Assessed patient's functional ability Established a written schedule for health screening [place] Completed and Reviewed  Exercise Activities and Dietary recommendations Goals    . DIET - REDUCE CALORIE INTAKE     Continue current diabetic diet changes to help aid with diabetes and weight loss.     . Exercise 3x per week (30 min per time)     Recommend to exercise for 3 days a week for at least 30 minutes at a time.        Immunization History  Administered Date(s) Administered  . Fluad Quad(high Dose 65+) 05/21/2020  . Influenza Split 06/23/2006, 05/29/2009, 06/15/2010  . Influenza, High Dose Seasonal PF 06/24/2014, 06/22/2017  . Influenza,inj,Quad PF,6+ Mos 06/29/2015  . Influenza-Unspecified 05/01/2018, 05/05/2019  . Moderna Sars-Covid-2 Vaccination 10/09/2019, 11/06/2019  . Pneumococcal Conjugate-13 09/23/2014  . Pneumococcal Polysaccharide-23 07/01/2012  . Td 08/28/1997, 03/20/2008  . Tdap 09/23/2014    Health Maintenance  Topic Date Due  . COVID-19 Vaccine (3 - Moderna risk 4-dose series) 12/04/2019  . COLONOSCOPY (Pts 45-78yrs Insurance coverage will need  to be confirmed)  10/11/2020  . FOOT EXAM  11/16/2020  . OPHTHALMOLOGY EXAM  03/29/2021  . HEMOGLOBIN A1C  05/14/2021  . DEXA SCAN  07/04/2022  . MAMMOGRAM  09/17/2022  . TETANUS/TDAP  09/23/2024  . INFLUENZA VACCINE  Completed  . Hepatitis C Screening  Completed  . PNA vac Low Risk Adult  Completed  . HPV VACCINES  Aged Out     Discussed health benefits of physical activity, and encouraged her to engage in regular exercise appropriate for her age and condition.    1. Paroxysmal atrial fibrillation (HCC) Stable. Followed by Cardiology.  2. Essential (primary) hypertension Stable. Continue medications prescribed from Cardiology.   3. Fatty liver Stable. Diet controlled.   4. Type 2 diabetes mellitus with foot ulcer, without long-term current use of insulin (HCC) Stable. Continue Farxiga 10mg .  5. Essential thrombocythemia ([place]) Stable.   6. BMI 37.0-37.9, adult Counseled patient on healthy lifestyle modifications including dieting and exercise.   7. Combined fat and carbohydrate induced hyperlipemia Stable. Continue simvastatin 40mg .   8. Colon cancer screening Patient desires to wait at this time.    No follow-ups on file.     Reynolds Bowl, PA-C, have reviewed all documentation for this visit. The documentation on 12/03/20 for the exam, diagnosis, procedures, and orders are all accurate and complete.   Rubye Beach  [facility] [phone] (phone) [phone] (fax)  [place]

## 2020-11-17 NOTE — Patient Instructions (Signed)

## 2020-11-18 ENCOUNTER — Other Ambulatory Visit: Payer: Self-pay | Admitting: Physician Assistant

## 2020-11-18 DIAGNOSIS — L97509 Non-pressure chronic ulcer of other part of unspecified foot with unspecified severity: Secondary | ICD-10-CM

## 2020-11-18 NOTE — Telephone Encounter (Signed)
Requested Prescriptions  Pending Prescriptions Disp Refills  . FARXIGA 10 MG TABS tablet [Pharmacy Med Name: FARXIGA 10 MG TABLET] 90 tablet 1    Sig: TAKE 1 TABLET (10 MG TOTAL) BY MOUTH DAILY BEFORE BREAKFAST.     Endocrinology:  Diabetes - SGLT2 Inhibitors Failed - 11/18/2020  1:37 AM      Failed - LDL in normal range and within 360 days    LDL Chol Calc (NIH)  Date Value Ref Range Status  11/11/2020 96 0 - 99 mg/dL Final         Passed - Cr in normal range and within 360 days    Creatinine  Date Value Ref Range Status  12/03/2014 0.65 mg/dL Final    Comment:    0.44-1.00 NOTE: New Reference Range  11/03/14    Creatinine, Ser  Date Value Ref Range Status  11/11/2020 0.99 0.57 - 1.00 mg/dL Final         Passed - HBA1C is between 0 and 7.9 and within 180 days    Hgb A1c MFr Bld  Date Value Ref Range Status  11/11/2020 7.5 (H) 4.8 - 5.6 % Final    Comment:             Prediabetes: 5.7 - 6.4          Diabetes: >6.4          Glycemic control for adults with diabetes: <7.0          Passed - eGFR in normal range and within 360 days    EGFR (African American)  Date Value Ref Range Status  12/03/2014 >60  Final   GFR calc Af Amer  Date Value Ref Range Status  11/04/2019 82 >59 mL/min/1.73 Final   EGFR (Non-African Amer.)  Date Value Ref Range Status  12/03/2014 >60  Final    Comment:    eGFR values <21m/min/1.73 m2 may be an indication of chronic kidney disease (CKD). Calculated eGFR is useful in patients with stable renal function. The eGFR calculation will not be reliable in acutely ill patients when serum creatinine is changing rapidly. It is not useful in patients on dialysis. The eGFR calculation may not be applicable to patients at the low and high extremes of body sizes, pregnant women, and vegetarians.    GFR calc non Af Amer  Date Value Ref Range Status  11/04/2019 71 >59 mL/min/1.73 Final         Passed - Valid encounter within last 6 months     Recent Outpatient Visits          Yesterday Paroxysmal atrial fibrillation ([facility]   [facility]  2 months ago Type 2 diabetes mellitus with foot ulcer, without long-term current use of insulin ([facility]   [place]  5 months ago Infection of ear lobe, left   [place]   6 months ago Type 2 diabetes mellitus with foot ulcer, without long-term current use of insulin ([facility]   [place]  1 year ago Type 2 diabetes mellitus with foot ulcer, without long-term current use of insulin ([facility]   [place]     Future Appointments            In 3 months Bacigalupo, ADionne Bucy MD [facility] [place]

## 2020-12-01 ENCOUNTER — Other Ambulatory Visit: Payer: Self-pay

## 2020-12-01 ENCOUNTER — Telehealth (INDEPENDENT_AMBULATORY_CARE_PROVIDER_SITE_OTHER): Payer: Self-pay | Admitting: Gastroenterology

## 2020-12-01 DIAGNOSIS — Z1211 Encounter for screening for malignant neoplasm of colon: Secondary | ICD-10-CM

## 2020-12-01 MED ORDER — GOLYTELY 236 G PO SOLR: 4000.0000 mL | Freq: Once | ORAL | 0 refills | Status: AC

## 2020-12-01 NOTE — Progress Notes (Signed)
Gastroenterology Pre-Procedure Review  Request Date: 02/11/21 Requesting Physician: Dr. Bonna Gains  PATIENT REVIEW QUESTIONS: The patient responded to the following health history questions as indicated:    1. Are you having any GI issues? no 2. Do you have a personal history of Polyps? no 3. Do you have a family history of Colon Cancer or Polyps? no 4. Diabetes Mellitus? yes (type 2) 5. Joint replacements in the past 12 months?no 6. Major health problems in the past 3 months?no 7. Any artificial heart valves, MVP, or defibrillator?no    MEDICATIONS & ALLERGIES:    Patient reports the following regarding taking any anticoagulation/antiplatelet therapy:   Plavix, Coumadin, Eliquis, Xarelto, Lovenox, Pradaxa, Brilinta, or Effient? no Aspirin? yes (81 mg daily)  Patient confirms/reports the following medications:  Current Outpatient Medications  Medication Sig Dispense Refill  . aspirin (ASPIRIN LOW DOSE) 81 MG EC tablet Take 1 tablet (81 mg total) by mouth daily. Swallow whole. 90 tablet 3  . Calcium Carbonate-Vitamin D 600-200 MG-UNIT CAPS Take 1 tablet by mouth daily.    Marland Kitchen FARXIGA 10 MG TABS tablet TAKE 1 TABLET (10 MG TOTAL) BY MOUTH DAILY BEFORE BREAKFAST. 90 tablet 1  . hydrochlorothiazide (HYDRODIURIL) 25 MG tablet Take 25 mg by mouth daily.    . metoprolol tartrate (LOPRESSOR) 25 MG tablet Take 12.5 mg by mouth 2 (two) times daily.     . MULTIPLE VITAMIN PO Take 1 tablet by mouth daily.    . Omega-3 Fatty Acids (FISH OIL) 1200 MG CAPS Take 1,200 mg by mouth daily.    Marland Kitchen omeprazole (PRILOSEC) 20 MG capsule Take 1 capsule (20 mg total) by mouth daily. 90 capsule 1  . simvastatin (ZOCOR) 40 MG tablet Take 40 mg by mouth at bedtime.    Marland Kitchen zinc gluconate 50 MG tablet Take 50 mg by mouth daily.    Marland Kitchen lisinopril (PRINIVIL,ZESTRIL) 40 MG tablet Take 40 mg by mouth daily.      No current facility-administered medications for this visit.    Patient confirms/reports the following  allergies:  Allergies  Allergen Reactions  . Ibuprofen     Elevates BP if taken continuously  . Meloxicam Swelling  . Multaq [Dronedarone]     Unknown reaction     No orders of the defined types were placed in this encounter.   AUTHORIZATION INFORMATION Primary Insurance: 1D#: Group #:  Secondary Insurance: 1D#: Group #:  SCHEDULE INFORMATION: Date: 02/11/21 Time: Location:[facility]

## 2020-12-02 ENCOUNTER — Other Ambulatory Visit: Payer: Self-pay

## 2020-12-02 MED ORDER — GOLYTELY 236 G PO SOLR: 4000.0000 mL | Freq: Once | ORAL | 0 refills | Status: AC

## 2020-12-08 ENCOUNTER — Other Ambulatory Visit: Payer: Self-pay | Admitting: Physician Assistant

## 2020-12-08 DIAGNOSIS — K2100 Gastro-esophageal reflux disease with esophagitis, without bleeding: Secondary | ICD-10-CM

## 2021-01-06 ENCOUNTER — Encounter: Payer: Self-pay | Admitting: Family Medicine

## 2021-01-07 MED ORDER — FLUCONAZOLE 150 MG PO TABS: 150.0000 mg | ORAL_TABLET | Freq: Once | ORAL | 0 refills | Status: AC

## 2021-01-07 NOTE — Telephone Encounter (Signed)
Ok to send in [place] 1 tab once #1 r0. Thanks

## 2021-01-26 ENCOUNTER — Other Ambulatory Visit: Payer: Self-pay | Admitting: Physician Assistant

## 2021-01-26 DIAGNOSIS — K2100 Gastro-esophageal reflux disease with esophagitis, without bleeding: Secondary | ICD-10-CM

## 2021-01-31 ENCOUNTER — Telehealth: Payer: Self-pay | Admitting: Gastroenterology

## 2021-01-31 ENCOUNTER — Encounter: Payer: Self-pay | Admitting: Physician Assistant

## 2021-01-31 DIAGNOSIS — K2100 Gastro-esophageal reflux disease with esophagitis, without bleeding: Secondary | ICD-10-CM

## 2021-01-31 MED ORDER — OMEPRAZOLE 20 MG PO CPDR: 20.0000 mg | DELAYED_RELEASE_CAPSULE | Freq: Every day | ORAL | 1 refills | Status: DC

## 2021-01-31 NOTE — Telephone Encounter (Signed)
Please call patient to answer questions about upcoming procedure

## 2021-02-01 NOTE — Telephone Encounter (Signed)
Pt had questions in reference to what she can drink other than water the day before as she stated she misplaced the letter. Examples of lear liquid drinks given to pt and she stated that if she had any additional questions she would call back or send a mychart msg

## 2021-02-11 ENCOUNTER — Ambulatory Visit
Admission: RE | Admit: 2021-02-11 | Discharge: 2021-02-11 | Disposition: A | Discharge status: Home / Self Care | Payer: Medicare Other | Attending: Gastroenterology | Admitting: Gastroenterology

## 2021-02-11 ENCOUNTER — Encounter
Admission: RE | Disposition: A | Discharge status: Home / Self Care | Payer: Self-pay | Source: Home / Self Care | Attending: Gastroenterology

## 2021-02-11 ENCOUNTER — Encounter: Payer: Self-pay | Admitting: Gastroenterology

## 2021-02-11 ENCOUNTER — Ambulatory Visit: Payer: Medicare Other | Admitting: Certified Registered Nurse Anesthetist

## 2021-02-11 DIAGNOSIS — K648 Other hemorrhoids: Secondary | ICD-10-CM | POA: Diagnosis not present

## 2021-02-11 DIAGNOSIS — Z886 Allergy status to analgesic agent status: Secondary | ICD-10-CM | POA: Diagnosis not present

## 2021-02-11 DIAGNOSIS — Z85828 Personal history of other malignant neoplasm of skin: Secondary | ICD-10-CM | POA: Diagnosis not present

## 2021-02-11 DIAGNOSIS — Z7984 Long term (current) use of oral hypoglycemic drugs: Secondary | ICD-10-CM | POA: Insufficient documentation

## 2021-02-11 DIAGNOSIS — D124 Benign neoplasm of descending colon: Secondary | ICD-10-CM | POA: Diagnosis not present

## 2021-02-11 DIAGNOSIS — Z79899 Other long term (current) drug therapy: Secondary | ICD-10-CM | POA: Insufficient documentation

## 2021-02-11 DIAGNOSIS — Z888 Allergy status to other drugs, medicaments and biological substances status: Secondary | ICD-10-CM | POA: Diagnosis not present

## 2021-02-11 DIAGNOSIS — K635 Polyp of colon: Secondary | ICD-10-CM

## 2021-02-11 DIAGNOSIS — Z1211 Encounter for screening for malignant neoplasm of colon: Secondary | ICD-10-CM

## 2021-02-11 DIAGNOSIS — Z7982 Long term (current) use of aspirin: Secondary | ICD-10-CM | POA: Diagnosis not present

## 2021-02-11 DIAGNOSIS — Z9049 Acquired absence of other specified parts of digestive tract: Secondary | ICD-10-CM | POA: Insufficient documentation

## 2021-02-11 HISTORY — PX: COLONOSCOPY WITH PROPOFOL: SHX5780

## 2021-02-11 LAB — GLUCOSE, CAPILLARY: Glucose-Capillary: 156 mg/dL — ABNORMAL HIGH (ref 70–99)

## 2021-02-11 SURGERY — COLONOSCOPY WITH PROPOFOL
Anesthesia: General

## 2021-02-11 MED ORDER — GLYCOPYRROLATE 0.2 MG/ML IJ SOLN
INTRAMUSCULAR | Status: DC | PRN
  Administered 2021-02-11: .2 mg via INTRAVENOUS

## 2021-02-11 MED ORDER — EPHEDRINE SULFATE 50 MG/ML IJ SOLN
INTRAMUSCULAR | Status: DC | PRN
  Administered 2021-02-11: 5 mg via INTRAVENOUS
  Administered 2021-02-11 (×2): 10 mg via INTRAVENOUS

## 2021-02-11 MED ORDER — PROPOFOL 500 MG/50ML IV EMUL
INTRAVENOUS | Status: AC
  Filled 2021-02-11: qty 150

## 2021-02-11 MED ORDER — LIDOCAINE HCL (CARDIAC) PF 100 MG/5ML IV SOSY
PREFILLED_SYRINGE | INTRAVENOUS | Status: DC | PRN
  Administered 2021-02-11: 60 mg via INTRAVENOUS

## 2021-02-11 MED ORDER — SODIUM CHLORIDE 0.9 % IV SOLN
INTRAVENOUS | Status: DC
  Administered 2021-02-11: 1000 mL via INTRAVENOUS

## 2021-02-11 MED ORDER — DEXMEDETOMIDINE (PRECEDEX) IN NS 20 MCG/5ML (4 MCG/ML) IV SYRINGE
PREFILLED_SYRINGE | INTRAVENOUS | Status: AC
  Filled 2021-02-11: qty 5

## 2021-02-11 MED ORDER — GLYCOPYRROLATE 0.2 MG/ML IJ SOLN
INTRAMUSCULAR | Status: AC
  Filled 2021-02-11: qty 1

## 2021-02-11 MED ORDER — EPHEDRINE 5 MG/ML INJ
INTRAVENOUS | Status: AC
  Filled 2021-02-11: qty 10

## 2021-02-11 MED ORDER — PROPOFOL 500 MG/50ML IV EMUL
INTRAVENOUS | Status: AC
  Filled 2021-02-11: qty 50

## 2021-02-11 MED ORDER — PHENYLEPHRINE HCL (PRESSORS) 10 MG/ML IV SOLN
INTRAVENOUS | Status: DC | PRN
  Administered 2021-02-11 (×2): 100 ug via INTRAVENOUS

## 2021-02-11 MED ORDER — PROPOFOL 10 MG/ML IV BOLUS
INTRAVENOUS | Status: DC | PRN
  Administered 2021-02-11: 20 mg via INTRAVENOUS
  Administered 2021-02-11: 30 mg via INTRAVENOUS
  Administered 2021-02-11: 50 mg via INTRAVENOUS

## 2021-02-11 MED ORDER — PROPOFOL 500 MG/50ML IV EMUL
INTRAVENOUS | Status: DC | PRN
  Administered 2021-02-11: 140 ug/kg/min via INTRAVENOUS

## 2021-02-11 NOTE — Op Note (Signed)
[facility] Gastroenterology Patient Name: Cyndee Giammarco Procedure Date: 02/11/2021 7:33 AM MRN: [id number] Account #: [id number] Date of Birth: [birth year] Admit Type: Outpatient Age: 75 Room: [facility] ENDO ROOM 2 Gender: Female Note Status: Finalized Procedure:             Colonoscopy Indications:           Screening for colorectal malignant neoplasm Providers:             Densel Kronick B. Bonna Gains MD, MD Referring MD:          Mar Daring (Referring MD) Medicines:             Monitored Anesthesia Care Complications:         No immediate complications. Procedure:             Pre-Anesthesia Assessment:                        - ASA Grade Assessment: II - A patient with mild                         systemic disease.                        - Prior to the procedure, a History and Physical was                         performed, and patient medications, allergies and                         sensitivities were reviewed. The patient's tolerance                         of previous anesthesia was reviewed.                        - The risks and benefits of the procedure and the                         sedation options and risks were discussed with the                         patient. All questions were answered and informed                         consent was obtained.                        - Patient identification and proposed procedure were                         verified prior to the procedure by the physician, the                         nurse, the anesthesiologist, the anesthetist and the                         technician. The procedure was verified in the                         procedure room.  After obtaining informed consent, the colonoscope was                         passed under direct vision. Throughout the procedure,                         the patient's blood pressure, pulse, and oxygen                         saturations were monitored  continuously. The                         Colonoscope was introduced through the anus and                         advanced to the the cecum, identified by appendiceal                         orifice and ileocecal valve. The colonoscopy was                         performed with ease. The patient tolerated the                         procedure well. The quality of the bowel preparation                         was good. Findings:      The perianal and digital rectal examinations were normal.      A 5 mm polyp was found in the descending colon. The polyp was sessile.       The polyp was removed with a cold snare. Resection and retrieval were       complete.      The exam was otherwise without abnormality.      The rectum, sigmoid colon, descending colon, transverse colon, ascending       colon and cecum appeared normal.      Non-bleeding internal hemorrhoids were found during retroflexion. Impression:            - One 5 mm polyp in the descending colon, removed with                         a cold snare. Resected and retrieved.                        - The examination was otherwise normal.                        - The rectum, sigmoid colon, descending colon,                         transverse colon, ascending colon and cecum are normal.                        - Non-bleeding internal hemorrhoids. Recommendation:        - Discharge patient to home (with escort).                        - Advance diet  as tolerated.                        - Continue present medications.                        - Await pathology results.                        - Repeat colonoscopy date to be determined after                         pending pathology results are reviewed.                        - The findings and recommendations were discussed with                         the patient.                        - The findings and recommendations were discussed with                         the patient's family.                         - Return to primary care physician as previously                         scheduled.                        - High fiber diet. Procedure Code(s):     --- Professional ---                        [phone], Colonoscopy, flexible; with removal of                         tumor(s), polyp(s), or other lesion(s) by snare                         technique Diagnosis Code(s):     --- Professional ---                        Z12.11, Encounter for screening for malignant neoplasm                         of colon                        K63.5, Polyp of colon CPT copyright 2019 American Medical Association. All rights reserved. The codes documented in this report are preliminary and upon coder review may  be revised to meet current compliance requirements.  Vonda Antigua, MD Margretta Sidle B. Bonna Gains MD, MD 02/11/2021 8:11:06 AM This report has been signed electronically. Number of Addenda: 0 Note Initiated On: 02/11/2021 7:33 AM Scope Withdrawal Time: 0 hours 17 minutes 48 seconds  Total Procedure Duration: 0 hours 21 minutes 25 seconds  Estimated Blood Loss:  Estimated blood loss: none.      [facility]

## 2021-02-11 NOTE — Anesthesia Preprocedure Evaluation (Signed)
Anesthesia Evaluation  Patient identified by MRN, date of birth, ID band Patient awake    Reviewed: Allergy & Precautions, NPO status , Patient's Chart, lab work & pertinent test results  History of Anesthesia Complications Negative for: history of anesthetic complications  Airway Mallampati: II  TM Distance: >3 FB Neck ROM: Full    Dental no notable dental hx.    Pulmonary sleep apnea , neg COPD,    breath sounds clear to auscultation- rhonchi (-) wheezing      Cardiovascular hypertension, Pt. on medications (-) CAD, (-) Past MI, (-) Cardiac Stents and (-) CABG Dysrhythmias: hx of afib s/p ablation.  Rhythm:Regular Rate:Normal - Systolic murmurs and - Diastolic murmurs    Neuro/Psych neg Seizures negative neurological ROS  negative psych ROS   GI/Hepatic negative GI ROS, Neg liver ROS,   Endo/Other  diabetes, Oral Hypoglycemic Agents  Renal/GU negative Renal ROS     Musculoskeletal negative musculoskeletal ROS (+)   Abdominal (+) + obese,   Peds  Hematology negative hematology ROS (+)   Anesthesia Other Findings Past Medical History: No date: Diabetes mellitus without complication (HCC)     Comment:  type 2 No date: Dysrhythmia     Comment:  a fib ~2014 Mohs: Hx of basal cell carcinoma     Comment:  Nose No date: Hyperlipidemia No date: Hypertension 11/11/2018: Osteomyelitis of second toe of right foot (HCC) No date: Sleep apnea     Comment:  cpap   Reproductive/Obstetrics                             Anesthesia Physical Anesthesia Plan  ASA: 3  Anesthesia Plan: General   Post-op Pain Management:    Induction: Intravenous  PONV Risk Score and Plan: 2 and Propofol infusion  Airway Management Planned: Natural Airway  Additional Equipment:   Intra-op Plan:   Post-operative Plan:   Informed Consent: I have reviewed the patients History and Physical, chart, labs and  discussed the procedure including the risks, benefits and alternatives for the proposed anesthesia with the patient or authorized representative who has indicated his/her understanding and acceptance.     Dental advisory given  Plan Discussed with: CRNA and Anesthesiologist  Anesthesia Plan Comments:         Anesthesia Quick Evaluation

## 2021-02-11 NOTE — Anesthesia Procedure Notes (Signed)
Date/Time: 02/11/2021 7:37 AM Performed by: Lily Peer, Coby Antrobus, CRNA Pre-anesthesia Checklist: Patient identified, Emergency Drugs available, Suction available, Patient being monitored and Timeout performed Patient Re-evaluated:Patient Re-evaluated prior to induction Oxygen Delivery Method: Nasal cannula Induction Type: IV induction

## 2021-02-11 NOTE — Transfer of Care (Signed)
Immediate Anesthesia Transfer of Care Note  Patient: Stacie Porter  Procedure(s) Performed: COLONOSCOPY WITH PROPOFOL  Patient Location: Endoscopy Unit  Anesthesia Type:General  Level of Consciousness: drowsy  Airway & Oxygen Therapy: Patient Spontanous Breathing  Post-op Assessment: Report given to RN and Post -op Vital signs reviewed and stable  Post vital signs: Reviewed and stable  Last Vitals:  Vitals Value Taken Time  BP 94/47 02/11/21 0808  Temp    Pulse 73 02/11/21 0808  Resp 21 02/11/21 0808  SpO2 91 % 02/11/21 0808  Vitals shown include unvalidated device data.  Last Pain:  Vitals:   02/11/21 0708  PainSc: 0-No pain         Complications: No notable events documented.

## 2021-02-11 NOTE — Anesthesia Postprocedure Evaluation (Signed)
Anesthesia Post Note  Patient: Stacie Porter  Procedure(s) Performed: COLONOSCOPY WITH PROPOFOL  Patient location during evaluation: Endoscopy Anesthesia Type: General Level of consciousness: awake and alert and oriented Pain management: pain level controlled Vital Signs Assessment: post-procedure vital signs reviewed and stable Respiratory status: spontaneous breathing, nonlabored ventilation and respiratory function stable Cardiovascular status: blood pressure returned to baseline and stable Postop Assessment: no signs of nausea or vomiting Anesthetic complications: no   No notable events documented.   Last Vitals:  Vitals:   02/11/21 0807 02/11/21 0817  BP: (!) 94/47 (!) 92/51  Pulse:    Resp:    Temp: (!) 35.9 C   SpO2:      Last Pain:  Vitals:   02/11/21 0827  TempSrc:   PainSc: 0-No pain                 Deagen Krass

## 2021-02-11 NOTE — H&P (Signed)
Vonda Antigua, MD [address], [place], [place], [place], [address] [address], [place], [place], [place], [address] Phone: [phone]  Fax: [phone]  Primary Care Physician:  Mar Daring, PA-C   Pre-Procedure History & Physical: HPI:  Stacie Porter is a 75 y.o. female is here for a colonoscopy.   Past Medical History:  Diagnosis Date   Diabetes mellitus without complication ([place])    type 2   Dysrhythmia    a fib   Hx of basal cell carcinoma ~2014 [facility]   Nose   Hyperlipidemia    Hypertension    Osteomyelitis of second toe of right foot ([place]) 11/11/2018   Sleep apnea    cpap    Past Surgical History:  Procedure Laterality Date   AMPUTATION TOE Right 11/11/2018   Procedure: AMPUTATION RIGHT 2ND TOE;  Surgeon: Dorna Leitz, MD;  Location: [facility];  Service: Orthopedics;  Laterality: Right;   APPENDECTOMY     CARDIAC ELECTROPHYSIOLOGY STUDY AND ABLATION     cather ablation  2011/2014   cardiac ablation for a fib   EYE SURGERY Bilateral 05/2014   cataract   HAMMER TOE SURGERY Right    HAND SURGERY Bilateral    LAPAROSCOPIC OOPHERECTOMY     recession of gums sx     TUBAL LIGATION      Prior to Admission medications   Medication Sig Start Date End Date Taking? Authorizing Provider  aspirin (ASPIRIN LOW DOSE) 81 MG EC tablet Take 1 tablet (81 mg total) by mouth daily. Swallow whole. 10/21/20  Yes Mar Daring, PA-C  Calcium Carbonate-Vitamin D 600-200 MG-UNIT CAPS Take 1 tablet by mouth daily. 01/18/07  Yes [provider]  FARXIGA 10 MG TABS tablet TAKE 1 TABLET (10 MG TOTAL) BY MOUTH DAILY BEFORE BREAKFAST. 11/18/20  Yes Burnette, Clearnce Sorrel, PA-C  hydrochlorothiazide (HYDRODIURIL) 25 MG tablet Take 25 mg by mouth daily.   Yes [provider]  metoprolol tartrate (LOPRESSOR) 25 MG tablet Take 12.5 mg by mouth 2 (two) times daily.    Yes [provider]  MULTIPLE VITAMIN PO Take 1 tablet by mouth daily. 01/18/07  Yes  [provider]  Omega-3 Fatty Acids (FISH OIL) 1200 MG CAPS Take 1,200 mg by mouth daily.   Yes [provider]  omeprazole (PRILOSEC) 20 MG capsule Take 1 capsule (20 mg total) by mouth daily. 01/31/21  Yes Birdie Sons, MD  simvastatin (ZOCOR) 40 MG tablet Take 40 mg by mouth at bedtime. 10/10/20  Yes [provider]  zinc gluconate 50 MG tablet Take 50 mg by mouth daily.   Yes [provider]  lisinopril (PRINIVIL,ZESTRIL) 40 MG tablet Take 40 mg by mouth daily.  07/30/17 11/16/20  [provider]    Allergies as of 12/01/2020 - Review Complete 12/01/2020  Allergen Reaction Noted   Ibuprofen  09/27/2015   Meloxicam Swelling 05/17/2015   Multaq [dronedarone]  05/17/2015    Family History  Problem Relation Age of Onset   Ovarian cancer Sister    Cancer Sister        ovarian   Lung cancer Sister    Cancer Sister        lung and thyroid   Thyroid cancer Sister    Heart disease Mother    Heart disease Father    Hypertension Father    Diabetes Maternal Uncle    Diabetes Maternal Uncle    Breast cancer Neg Hx     Social  History   Socioeconomic History   Marital status: Married    Spouse name: Not on file   Number of children: 3   Years of education: Not on file   Highest education level: Some college, no degree  Occupational History   Occupation: retired    Comment: part time subsitute   Tobacco Use   Smoking status: Never   Smokeless tobacco: Never  Vaping Use   Vaping Use: Never used  Substance and Sexual Activity   Alcohol use: No   Drug use: No   Sexual activity: Yes  Other Topics Concern   Not on file  Social History Narrative   Not on file   Social Determinants of Health   Financial Resource Strain: Low Risk    Difficulty of Paying Living Expenses: Not hard at all  Food Insecurity: No Food Insecurity   Worried About Charity fundraiser in the Last Year: Never true   [place] in the Last Year: Never  true  Transportation Needs: No Transportation Needs   Lack of Transportation (Medical): No   Lack of Transportation (Non-Medical): No  Physical Activity: Inactive   Days of Exercise per Week: 0 days   Minutes of Exercise per Session: 0 min  Stress: No Stress Concern Present   Feeling of Stress : Not at all  Social Connections: Socially Integrated   Frequency of Communication with Friends and Family: More than three times a week   Frequency of Social Gatherings with Friends and Family: More than three times a week   Attends Religious Services: More than 4 times per year   Active Member of [employer] or Organizations: Yes   Attends Music therapist: More than 4 times per year   Marital Status: Married  Human resources officer Violence: Not At Risk   Fear of Current or Ex-Partner: No   Emotionally Abused: No   Physically Abused: No   Sexually Abused: No    Review of Systems: See HPI, otherwise negative ROS  Physical Exam: BP 136/60   Pulse 60   Temp (!) 96.8 F (36 C)   Resp 16   Ht 5\' 5"  (1.651 m)   Wt 97.7 kg   SpO2 97%   BMI 35.85 kg/m  General:   Alert,  pleasant and cooperative in NAD Head:  Normocephalic and atraumatic. Neck:  Supple; no masses or thyromegaly. Lungs:  Clear throughout to auscultation, normal respiratory effort.    Heart:  +S1, +S2, Regular rate and rhythm, No edema. Abdomen:  Soft, nontender and nondistended. Normal bowel sounds, without guarding, and without rebound.   Neurologic:  Alert and  oriented x4;  grossly normal neurologically.  Impression/Plan: NICKIE WARWICK is here for a colonoscopy to be performed for average risk screening.  Risks, benefits, limitations, and alternatives regarding  colonoscopy have been reviewed with the patient.  Questions have been answered.  All parties agreeable.   Virgel Manifold, MD  02/11/2021, 7:34 AM

## 2021-02-14 ENCOUNTER — Encounter: Payer: Self-pay | Admitting: Gastroenterology

## 2021-02-14 LAB — SURGICAL PATHOLOGY

## 2021-02-17 ENCOUNTER — Ambulatory Visit (INDEPENDENT_AMBULATORY_CARE_PROVIDER_SITE_OTHER): Payer: Medicare Other | Admitting: Dermatology

## 2021-02-17 ENCOUNTER — Other Ambulatory Visit: Payer: Self-pay

## 2021-02-17 DIAGNOSIS — L821 Other seborrheic keratosis: Secondary | ICD-10-CM

## 2021-02-17 DIAGNOSIS — Z1283 Encounter for screening for malignant neoplasm of skin: Secondary | ICD-10-CM

## 2021-02-17 DIAGNOSIS — D229 Melanocytic nevi, unspecified: Secondary | ICD-10-CM

## 2021-02-17 DIAGNOSIS — L814 Other melanin hyperpigmentation: Secondary | ICD-10-CM

## 2021-02-17 DIAGNOSIS — I8393 Asymptomatic varicose veins of bilateral lower extremities: Secondary | ICD-10-CM | POA: Diagnosis not present

## 2021-02-17 DIAGNOSIS — Z85828 Personal history of other malignant neoplasm of skin: Secondary | ICD-10-CM

## 2021-02-17 DIAGNOSIS — D1800 Hemangioma unspecified site: Secondary | ICD-10-CM

## 2021-02-17 DIAGNOSIS — Z86018 Personal history of other benign neoplasm: Secondary | ICD-10-CM

## 2021-02-17 DIAGNOSIS — L578 Other skin changes due to chronic exposure to nonionizing radiation: Secondary | ICD-10-CM

## 2021-02-17 NOTE — Patient Instructions (Signed)

## 2021-02-17 NOTE — Progress Notes (Signed)
   Follow-Up Visit   Subjective  Stacie Porter is a 75 y.o. female who presents for the following: Annual Exam (History of BCC - TBSE today). the patient presents for Total-Body Skin Exam (TBSE) for skin cancer screening and mole check.  The following portions of the chart were reviewed this encounter and updated as appropriate:   Tobacco  Allergies  Meds  Problems  Med Hx  Surg Hx  Fam Hx      Review of Systems:  No other skin or systemic complaints except as noted in HPI or Assessment and Plan.  Objective  Well appearing patient in no apparent distress; mood and affect are within normal limits.  A full examination was performed including scalp, head, eyes, ears, nose, lips, neck, chest, axillae, abdomen, back, buttocks, bilateral upper extremities, bilateral lower extremities, hands, feet, fingers, toes, fingernails, and toenails. All findings within normal limits unless otherwise noted below.  Legs Spider veins   Assessment & Plan   History of Dysplastic Nevi - No evidence of recurrence today - Recommend regular full body skin exams - Recommend daily broad spectrum sunscreen SPF 30+ to sun-exposed areas, reapply every 2 hours as needed.  - Call if any new or changing lesions are noted between office visits   Lentigines - Scattered tan macules - Due to sun exposure - Benign-appering, observe - Recommend daily broad spectrum sunscreen SPF 30+ to sun-exposed areas, reapply every 2 hours as needed. - Call for any changes  Seborrheic Keratoses - Stuck-on, waxy, tan-brown papules and/or plaques  - Benign-appearing - Discussed benign etiology and prognosis. - Observe - Call for any changes  Melanocytic Nevi - Tan-brown and/or pink-flesh-colored symmetric macules and papules - Benign appearing on exam today - Observation - Call clinic for new or changing moles - Recommend daily use of broad spectrum spf 30+ sunscreen to sun-exposed areas.   Hemangiomas - Red  papules - Discussed benign nature - Observe - Call for any changes  Actinic Damage - Chronic condition, secondary to cumulative UV/sun exposure - diffuse scaly erythematous macules with underlying dyspigmentation - Recommend daily broad spectrum sunscreen SPF 30+ to sun-exposed areas, reapply every 2 hours as needed.  - Staying in the shade or wearing long sleeves, sun glasses (UVA+UVB protection) and wide brim hats (4-inch brim around the entire circumference of the hat) are also recommended for sun protection.  - Call for new or changing lesions.  Skin cancer screening performed today.  Spider veins of both lower extremities Legs  Benign  Skin cancer screening  Return in about 1 year (around 02/17/2022) for TBSE.  I, Ashok Cordia, CMA, am acting as scribe for Sarina Ser, MD .  Documentation: I have reviewed the above documentation for accuracy and completeness, and I agree with the above.  Sarina Ser, MD

## 2021-02-18 ENCOUNTER — Encounter: Payer: Self-pay | Admitting: Family Medicine

## 2021-02-18 ENCOUNTER — Ambulatory Visit (INDEPENDENT_AMBULATORY_CARE_PROVIDER_SITE_OTHER): Payer: Medicare Other | Admitting: Family Medicine

## 2021-02-18 VITALS — BP 126/53 | HR 58 | Temp 98.0°F | Resp 15 | Wt 220.1 lb

## 2021-02-18 DIAGNOSIS — E785 Hyperlipidemia, unspecified: Secondary | ICD-10-CM

## 2021-02-18 DIAGNOSIS — E11621 Type 2 diabetes mellitus with foot ulcer: Secondary | ICD-10-CM

## 2021-02-18 DIAGNOSIS — E1169 Type 2 diabetes mellitus with other specified complication: Secondary | ICD-10-CM

## 2021-02-18 DIAGNOSIS — E1159 Type 2 diabetes mellitus with other circulatory complications: Secondary | ICD-10-CM

## 2021-02-18 DIAGNOSIS — L97509 Non-pressure chronic ulcer of other part of unspecified foot with unspecified severity: Secondary | ICD-10-CM

## 2021-02-18 DIAGNOSIS — I152 Hypertension secondary to endocrine disorders: Secondary | ICD-10-CM

## 2021-02-18 LAB — POCT GLYCOSYLATED HEMOGLOBIN (HGB A1C): Hemoglobin A1C: 6.7 % — AB (ref 4.0–5.6)

## 2021-02-18 NOTE — Assessment & Plan Note (Signed)
Well controlled Continue current medications Reviewed last metabolic panel

## 2021-02-18 NOTE — Assessment & Plan Note (Signed)
Reviewed last lipid panel Discussed diet and exercise

## 2021-02-18 NOTE — Assessment & Plan Note (Signed)
Well controlled Continue current meds Needs foot exam at next visit Discussed diet and exercise

## 2021-02-18 NOTE — Progress Notes (Signed)
.     Established patient visit   Patient: Stacie Porter   DOB: [birth year]   75 y.o. Female  MRN: [id number] Visit Date: 02/18/2021  Today's healthcare provider: Lavon Paganini, MD   Chief Complaint  Patient presents with   Hypertension   Diabetes   Hyperlipidemia   Subjective    Hypertension Pertinent negatives include no chest pain, headaches or shortness of breath.  Diabetes Pertinent negatives for hypoglycemia include no dizziness or headaches. Pertinent negatives for diabetes include no chest pain and no fatigue.  Hyperlipidemia Pertinent negatives include no chest pain or shortness of breath.   Diabetes Mellitus Type II, follow-up  Lab Results  Component Value Date   HGBA1C 6.7 (A) 02/18/2021   HGBA1C 7.5 (H) 11/11/2020   HGBA1C 7.3 (A) 08/25/2020   Last seen for diabetes 3 months ago.  Management since then includes continuing the same treatment. She reports excellent compliance with treatment. She is having side effects. Vaginal itching  Home blood sugar records:  150-170  Episodes of hypoglycemia? No    Current insulin regiment: none Most Recent Eye Exam: 03/29/2020  --------------------------------------------------------------------------------------------------- Hypertension, follow-up  BP Readings from Last 3 Encounters:  02/18/21 (!) 126/53  02/11/21 (!) 92/51  11/17/20 (!) 143/78   Wt Readings from Last 3 Encounters:  02/18/21 220 lb 1.6 oz (99.8 kg)  02/11/21 215 lb 6.9 oz (97.7 kg)  11/17/20 225 lb (102.1 kg)     She was last seen for hypertension 3 months ago.  BP at that visit was 143/78. Management since that visit includes none. She reports excellent compliance with treatment. She is not having side effects.  She is exercising. She is adherent to low salt diet.   Outside blood pressures are patient reports systolic 062-694, and diastolic 85I.  She does not smoke.  Use of agents associated with hypertension: NSAIDS.    --------------------------------------------------------------------------------------------------- Lipid/Cholesterol, follow-up  Last Lipid Panel: Lab Results  Component Value Date   CHOL 164 11/11/2020   LDLCALC 96 11/11/2020   HDL 34 (L) 11/11/2020   TRIG 194 (H) 11/11/2020    She was last seen for this 3 months ago.  Management since that visit includes none continue medication.  She reports excellent compliance with treatment. She is not having side effects.   Symptoms: No appetite changes No foot ulcerations  No chest pain No chest pressure/discomfort  No dyspnea No orthopnea  No fatigue No lower extremity edema  No palpitations No paroxysmal nocturnal dyspnea  No nausea No numbness or tingling of extremity  No polydipsia No polyuria  No speech difficulty No syncope   She is following a Regular diet. Current exercise: no regular exercise  Last metabolic panel Lab Results  Component Value Date   GLUCOSE 171 (H) 11/11/2020   NA 139 11/11/2020   K 4.4 11/11/2020   BUN 30 (H) 11/11/2020   CREATININE 0.99 11/11/2020   GFRNONAA 71 11/04/2019   GFRAA 82 11/04/2019   CALCIUM 9.6 11/11/2020   AST 25 11/11/2020   ALT 18 11/11/2020   The 10-year ASCVD risk score Mikey Bussing DC Jr., et al., 2013) is: 32.3%  ---------------------------------------------------------------------------------------------------   Patient Active Problem List   Diagnosis Date Noted   Encounter for screening colonoscopy    Polyp of descending colon    OSA (obstructive sleep apnea) 09/28/2020   Fatty liver 07/18/2019   Type 2 diabetes mellitus with foot ulcer, without long-term current use of insulin ([place]) 10/04/2018   Hyperlipidemia associated with type 2  diabetes mellitus ([place]) 07/12/2015   MI (mitral incompetence) 07/12/2015   Paroxysmal atrial fibrillation ([place]) 07/12/2015   Abdominal pain 05/17/2015   Strain of tendon of foot and ankle 05/17/2015   Morbid obesity ([place]) 05/17/2015    Esophagitis, reflux 05/17/2015   Dizziness 05/17/2015   Hypertension associated with diabetes ([place]) 05/17/2015   TI (tricuspid incompetence) 01/11/2015   H/O malignant neoplasm of skin 02/05/2013   Essential thrombocythemia ([place]) 10/01/2012   Blood in the urine 02/24/2010   History of shingles 09/16/2007   Past Medical History:  Diagnosis Date   Diabetes mellitus without complication ([place])    type 2   Dysrhythmia    a fib   Hx of basal cell carcinoma ~2014 [facility]   Nose   Hyperlipidemia    Hypertension    Osteomyelitis of second toe of right foot ([place]) 11/11/2018   Sleep apnea    cpap                                            Allergies  Allergen Reactions   Ibuprofen     Elevates BP if taken continuously   Meloxicam Swelling   Multaq [Dronedarone]     Unknown reaction        Medications: Outpatient Medications Prior to Visit  Medication Sig Note   aspirin (ASPIRIN LOW DOSE) 81 MG EC tablet Take 1 tablet (81 mg total) by mouth daily. Swallow whole.    Calcium Carbonate-Vitamin D 600-200 MG-UNIT CAPS Take 1 tablet by mouth daily.    FARXIGA 10 MG TABS tablet TAKE 1 TABLET (10 MG TOTAL) BY MOUTH DAILY BEFORE BREAKFAST.    hydrochlorothiazide (HYDRODIURIL) 25 MG tablet Take 25 mg by mouth daily.    metoprolol tartrate (LOPRESSOR) 25 MG tablet Take 12.5 mg by mouth 2 (two) times daily.  02/18/2021: Patient reports she takes half tablet in morning and evening   MULTIPLE VITAMIN PO Take 1 tablet by mouth daily.    Omega-3 Fatty Acids (FISH OIL) 1200 MG CAPS Take 1,200 mg by mouth daily.    omeprazole (PRILOSEC) 20 MG capsule Take 1 capsule (20 mg total) by mouth daily.    simvastatin (ZOCOR) 40 MG tablet Take 40 mg by mouth at bedtime.    zinc gluconate 50 MG tablet Take 50 mg by mouth daily.    lisinopril (PRINIVIL,ZESTRIL) 40 MG tablet Take 40 mg by mouth daily.     No facility-administered medications prior to visit.    Review of Systems  Constitutional:  Negative.  Negative for chills, fatigue and fever.  HENT: Negative.  Negative for congestion, ear pain, rhinorrhea, sinus pain and sore throat.   Respiratory:  Negative for cough, chest tightness, shortness of breath and wheezing.   Cardiovascular: Negative.  Negative for chest pain and leg swelling.  Gastrointestinal: Negative.  Negative for abdominal pain, blood in stool, diarrhea, nausea and vomiting.  Genitourinary:  Negative for dysuria, flank pain, frequency and urgency.  Neurological:  Negative for dizziness and headaches.      Objective    BP (!) 126/53   Pulse (!) 58   Temp 98 F (36.7 C) (Oral)   Resp 15   Wt 220 lb 1.6 oz (99.8 kg)   SpO2 94%   BMI 36.63 kg/m     Physical Exam Vitals reviewed.  Constitutional:      General: She is  not in acute distress.    Appearance: Normal appearance. She is well-developed. She is not diaphoretic.  HENT:     Head: Normocephalic and atraumatic.  Eyes:     General: No scleral icterus.    Conjunctiva/sclera: Conjunctivae normal.  Neck:     Thyroid: No thyromegaly.  Cardiovascular:     Rate and Rhythm: Normal rate and regular rhythm.     Pulses: Normal pulses.     Heart sounds: Murmur heard.  Pulmonary:     Effort: Pulmonary effort is normal. No respiratory distress.     Breath sounds: Normal breath sounds. No wheezing, rhonchi or rales.  Musculoskeletal:     Cervical back: Neck supple.     Right lower leg: No edema.     Left lower leg: No edema.  Lymphadenopathy:     Cervical: No cervical adenopathy.  Skin:    General: Skin is warm and dry.     Findings: No rash.  Neurological:     Mental Status: She is alert and oriented to person, place, and time. Mental status is at baseline.  Psychiatric:        Mood and Affect: Mood normal.        Behavior: Behavior normal.      Results for orders placed or performed in visit on 02/18/21  POCT glycosylated hemoglobin (Hb A1C)  Result Value Ref Range   Hemoglobin A1C 6.7 (A)  4.0 - 5.6 %   HbA1c POC (<> result, manual entry)     HbA1c, POC (prediabetic range)     HbA1c, POC (controlled diabetic range)      Assessment & Plan     Problem List Items Addressed This Visit       Cardiovascular and Mediastinum   Hypertension associated with diabetes ([place])    Well controlled Continue current medications Reviewed last metabolic panel         Endocrine   Hyperlipidemia associated with type 2 diabetes mellitus ([place])    Reviewed last lipid panel Discussed diet and exercise       Type 2 diabetes mellitus with foot ulcer, without long-term current use of insulin ([place]) - Primary    Well controlled Continue current meds Needs foot exam at next visit Discussed diet and exercise       Relevant Orders   POCT glycosylated hemoglobin (Hb A1C) (Completed)     Other   Morbid obesity (HCC)    BMI 36 and assoc with HTN, DM, HLD Discussed importance of healthy weight management Discussed diet and exercise          Return in about 3 months (around 05/21/2021) for chronic disease f/u, call back in 1 month to schedule with new PCP.      Frederic Jericho [place],acting as a Education administrator for Lavon Paganini, MD.,have documented all relevant documentation on the behalf of Lavon Paganini, MD,as directed by  Lavon Paganini, MD while in the presence of Lavon Paganini, MD.   I, Lavon Paganini, MD, have reviewed all documentation for this visit. The documentation on 02/18/21 for the exam, diagnosis, procedures, and orders are all accurate and complete.   Wisam Siefring, Dionne Bucy, MD, MPH [facility]

## 2021-02-18 NOTE — Assessment & Plan Note (Signed)
BMI 36 and assoc with HTN, DM, HLD Discussed importance of healthy weight management Discussed diet and exercise

## 2021-03-03 ENCOUNTER — Encounter: Payer: Self-pay | Admitting: Dermatology

## 2021-03-31 LAB — HM DIABETES EYE EXAM

## 2021-04-06 ENCOUNTER — Encounter: Payer: Self-pay | Admitting: Family Medicine

## 2021-04-06 ENCOUNTER — Other Ambulatory Visit: Payer: Self-pay

## 2021-04-06 ENCOUNTER — Ambulatory Visit (INDEPENDENT_AMBULATORY_CARE_PROVIDER_SITE_OTHER): Payer: Medicare Other | Admitting: Family Medicine

## 2021-04-06 VITALS — BP 124/52 | HR 70 | Temp 98.3°F | Resp 16 | Wt 223.8 lb

## 2021-04-06 DIAGNOSIS — L97509 Non-pressure chronic ulcer of other part of unspecified foot with unspecified severity: Secondary | ICD-10-CM

## 2021-04-06 DIAGNOSIS — E1159 Type 2 diabetes mellitus with other circulatory complications: Secondary | ICD-10-CM

## 2021-04-06 DIAGNOSIS — E11621 Type 2 diabetes mellitus with foot ulcer: Secondary | ICD-10-CM | POA: Diagnosis not present

## 2021-04-06 DIAGNOSIS — I152 Hypertension secondary to endocrine disorders: Secondary | ICD-10-CM

## 2021-04-06 DIAGNOSIS — L720 Epidermal cyst: Secondary | ICD-10-CM

## 2021-04-06 NOTE — Assessment & Plan Note (Signed)
R back, upper L of bra line 4x3 firm mass Faint telangiectasias on surface  Derm referral placed

## 2021-04-06 NOTE — Assessment & Plan Note (Signed)
Continue to focus on healthy lifestyle  Plan for bariatric referral if requested

## 2021-04-06 NOTE — Progress Notes (Signed)
Established patient visit   Patient: Stacie Porter   DOB: 07-Oct-1945   75 y.o. Female  MRN: [id number] Visit Date: 04/06/2021  Today's healthcare provider: [name], FNP   Chief Complaint  Patient presents with   Skin Problem    Patient presents in office today with concern of a possible lump she found on her upper back in the past two weeks.Patient denies any pain or hardening of skin around site.   Subjective    HPI HPI     Skin Problem    Additional comments: Patient presents in office today with concern of a possible lump she found on her upper back in the past two weeks.Patient denies any pain or hardening of skin around site.      Last edited by Minette Headland, CMA on 04/06/2021  2:51 PM.       Patient Active Problem List   Diagnosis Date Noted   Epidermal cyst 04/06/2021   Encounter for screening colonoscopy    Polyp of descending colon    OSA (obstructive sleep apnea) 09/28/2020   Fatty liver 07/18/2019   Type 2 diabetes mellitus with foot ulcer, without long-term current use of insulin ([place]) 10/04/2018   Hyperlipidemia associated with type 2 diabetes mellitus ([place]) 07/12/2015   MI (mitral incompetence) 07/12/2015   Paroxysmal atrial fibrillation ([place]) 07/12/2015   Morbid obesity ([place]) 05/17/2015   Esophagitis, reflux 05/17/2015   Hypertension associated with diabetes ([place]) 05/17/2015   TI (tricuspid incompetence) 01/11/2015   H/O malignant neoplasm of skin 02/05/2013   Essential thrombocythemia ([place]) 10/01/2012   History of shingles 09/16/2007   Past Medical History:  Diagnosis Date   Diabetes mellitus without complication ([place])    type 2   Dysrhythmia    a fib   Hx of basal cell carcinoma ~2014 [facility]   Nose   Hyperlipidemia    Hypertension    Osteomyelitis of second toe of right foot ([place]) 11/11/2018   Sleep apnea    cpap   Allergies  Allergen Reactions   Ibuprofen     Elevates BP if taken continuously   Meloxicam Swelling    Multaq [Dronedarone]     Unknown reaction        Medications: Outpatient Medications Prior to Visit  Medication Sig   aspirin (ASPIRIN LOW DOSE) 81 MG EC tablet Take 1 tablet (81 mg total) by mouth daily. Swallow whole.   Calcium Carbonate-Vitamin D 600-200 MG-UNIT CAPS Take 1 tablet by mouth daily.   FARXIGA 10 MG TABS tablet TAKE 1 TABLET (10 MG TOTAL) BY MOUTH DAILY BEFORE BREAKFAST.   hydrochlorothiazide (HYDRODIURIL) 25 MG tablet Take 25 mg by mouth daily.   metoprolol tartrate (LOPRESSOR) 25 MG tablet Take 12.5 mg by mouth 2 (two) times daily.    MULTIPLE VITAMIN PO Take 1 tablet by mouth daily.   Omega-3 Fatty Acids (FISH OIL) 1200 MG CAPS Take 1,200 mg by mouth daily.   omeprazole (PRILOSEC) 20 MG capsule Take 1 capsule (20 mg total) by mouth daily.   simvastatin (ZOCOR) 40 MG tablet Take 40 mg by mouth at bedtime.   zinc gluconate 50 MG tablet Take 50 mg by mouth daily.   lisinopril (PRINIVIL,ZESTRIL) 40 MG tablet Take 40 mg by mouth daily.    No facility-administered medications prior to visit.    Review of Systems     Objective    BP (!) 124/52   Pulse 70   Temp 98.3 F (36.8  C) (Oral)   Resp 16   Wt 223 lb 12.8 oz (101.5 kg)   SpO2 94%   BMI 37.24 kg/m     Physical Exam Vitals and nursing note reviewed.  Constitutional:      General: She is not in acute distress.    Appearance: She is obese. She is not ill-appearing, toxic-appearing or diaphoretic.  Cardiovascular:     Rate and Rhythm: Normal rate and regular rhythm.     Pulses: Normal pulses.     Heart sounds: Normal heart sounds.  Pulmonary:     Effort: Pulmonary effort is normal.     Breath sounds: Normal breath sounds.  Skin:    General: Skin is warm and dry.     Capillary Refill: Capillary refill takes 2 to 3 seconds.     Findings: No abscess or erythema.          Comments: R back, upper L of bra line 4x3 firm mass Faint telangiectasias on surface  Derm referral placed   Neurological:      General: No focal deficit present.     Mental Status: She is alert and oriented to person, place, and time. Mental status is at baseline.  Psychiatric:        Mood and Affect: Mood normal.        Behavior: Behavior normal.        Thought Content: Thought content normal.        Judgment: Judgment normal.      Results for orders placed or performed in visit on 04/06/21  HM DIABETES EYE EXAM  Result Value Ref Range   HM Diabetic Eye Exam No Retinopathy No Retinopathy    Assessment & Plan     Problem List Items Addressed This Visit       Cardiovascular and Mediastinum   Hypertension associated with diabetes ([place])    Stable today in office  Continue current medication regimen  No complaints currently         Endocrine   Type 2 diabetes mellitus with foot ulcer, without long-term current use of insulin (HCC)    Pt has no complaints of highs or lows blood glucose feeling  Discussed importance of diet and exercise  Plan for foot exam at next visit with A1c check         Musculoskeletal and Integument   Epidermal cyst - Primary    R back, upper L of bra line 4x3 firm mass Faint telangiectasias on surface  Derm referral placed       Relevant Orders   Ambulatory referral to Dermatology     Other   Morbid obesity ([place])    Continue to focus on healthy lifestyle  Plan for bariatric referral if requested         Return if symptoms worsen or fail to improve.      Vonna Kotyk, FNP, have reviewed all documentation for this visit. The documentation on 04/06/21 for the exam, diagnosis, procedures, and orders are all accurate and complete.    [name], [place] [phone] (phone) [phone] (fax)  [place]

## 2021-04-06 NOTE — Assessment & Plan Note (Signed)
Pt has no complaints of highs or lows blood glucose feeling  Discussed importance of diet and exercise  Plan for foot exam at next visit with A1c check

## 2021-04-06 NOTE — Assessment & Plan Note (Signed)
Stable today in office  Continue current medication regimen  No complaints currently

## 2021-04-19 ENCOUNTER — Ambulatory Visit (INDEPENDENT_AMBULATORY_CARE_PROVIDER_SITE_OTHER): Payer: Medicare Other | Admitting: Dermatology

## 2021-04-19 ENCOUNTER — Other Ambulatory Visit: Payer: Self-pay

## 2021-04-19 DIAGNOSIS — L814 Other melanin hyperpigmentation: Secondary | ICD-10-CM

## 2021-04-19 DIAGNOSIS — D179 Benign lipomatous neoplasm, unspecified: Secondary | ICD-10-CM

## 2021-04-19 NOTE — Progress Notes (Signed)
   Follow-Up Visit   Subjective  Stacie Porter is a 75 y.o. female who presents for the following: Growth (Right upper back x 3-4 weeks. Not painful. ).   The following portions of the chart were reviewed this encounter and updated as appropriate:       Review of Systems:  No other skin or systemic complaints except as noted in HPI or Assessment and Plan.  Objective  Well appearing patient in no apparent distress; mood and affect are within normal limits.  A focused examination was performed including back. Relevant physical exam findings are noted in the Assessment and Plan.  Right Posterior Shoulder 4.0cm rubbery nodule   Assessment & Plan  Lipoma, unspecified site Right Posterior Shoulder  Benign-appearing, Observation  Discussed surgical option with patient, including resulting scar and possible recurrence.  Patient prefers to observe for changes since asymptomatic.   RTC if any changes   Lentigines - Scattered tan macules back - Due to sun exposure - Benign-appering, observe - Recommend daily broad spectrum sunscreen SPF 30+ to sun-exposed areas, reapply every 2 hours as needed. - Call for any changes   Return as scheduled with Dr Dara Lords, Jamesetta Orleans, CMA, am acting as scribe for Brendolyn Patty, MD . Documentation: I have reviewed the above documentation for accuracy and completeness, and I agree with the above.  Brendolyn Patty MD

## 2021-04-19 NOTE — Patient Instructions (Addendum)
Lipoma  A lipoma is a noncancerous (benign) tumor that is made up of fat cells. This is a very common type of soft-tissue growth. Lipomas are usually found under the skin (subcutaneous). They may occur in any tissue of the body that contains fat. Common areas forlipomas to appear include the back, arms, shoulders, buttocks, and thighs. Lipomas grow slowly, and they are usually painless. Most lipomas do not causeproblems and do not require treatment. What are the causes? The cause of this condition is not known. What increases the risk? You are more likely to develop this condition if: You are 50-44 years old. You have a family history of lipomas. What are the signs or symptoms? A lipoma usually appears as a small, round bump under the skin. In most cases, the lump will: Feel soft or rubbery. Not cause pain or other symptoms. However, if a lipoma is located in an area where it pushes on nerves, it canbecome painful or cause other symptoms. How is this diagnosed? A lipoma can usually be diagnosed with a physical exam. You may also have tests to confirm the diagnosis and to rule out other conditions. Tests may include: Imaging tests, such as a CT scan or an MRI. Removal of a tissue sample to be looked at under a microscope (biopsy). How is this treated? Treatment for this condition depends on the size of the lipoma and whether it is causing any symptoms. For small lipomas that are not causing problems, no treatment is needed. If a lipoma is bigger or it causes problems, surgery may be done to remove the lipoma. Lipomas can also be removed to improve appearance. Most often, the procedure is done after applying a medicine that numbs the area (local anesthetic). Liposuction may be done to reduce the size of the lipoma before it is removed through surgery, or it may be done to remove the lipoma. Lipomas are removed with this method in order to limit incision size and scarring. A liposuction tube is  inserted through a small incision into the lipoma, and the contents of the lipoma are removed through the tube with suction. Follow these instructions at home: Watch your lipoma for any changes. Keep all follow-up visits as told by your health care provider. This is important. Contact a health care provider if: Your lipoma becomes larger or hard. Your lipoma becomes painful, red, or increasingly swollen. These could be signs of infection or a more serious condition. Get help right away if: You develop tingling or numbness in an area near the lipoma. This could indicate that the lipoma is causing nerve damage. Summary A lipoma is a noncancerous tumor that is made up of fat cells. Most lipomas do not cause problems and do not require treatment. If a lipoma is bigger or it causes problems, surgery may be done to remove the lipoma. Contact a health care provider if your lipoma becomes larger or hard, or if it becomes painful, red, or increasingly swollen. Pain, redness, and swelling could be signs of infection or a more serious condition. This information is not intended to replace advice given to you by your health care provider. Make sure you discuss any questions you have with your healthcare provider. Document Revised: 03/31/2019 Document Reviewed: 03/31/2019 Elsevier Patient Education  2022 [employer].  If you have any questions or concerns for your doctor, please call our main line at [phone] and press option 4 to reach your doctor's medical assistant. If no one answers, please leave a voicemail  as directed and we will return your call as soon as possible. Messages left after 4 pm will be answered the following business day.   You may also send [place] a message via [place]. We typically respond to MyChart messages within 1-2 business days.  For prescription refills, please ask your pharmacy to contact our office. Our fax number is [phone].  If you have an urgent issue when the  clinic is closed that cannot wait until the next business day, you can page your doctor at the number below.    Please note that while we do our best to be available for urgent issues outside of office hours, we are not available 24/7.   If you have an urgent issue and are unable to reach [place], you may choose to seek medical care at your doctor's office, retail clinic, urgent care center, or emergency room.  If you have a medical emergency, please immediately call 911 or go to the emergency department.  Pager Numbers  - Dr. Nehemiah Massed: [phone]  - Dr. Laurence Ferrari: [phone]  - Dr. Nicole Kindred: [phone]  In the event of inclement weather, please call our main line at [phone] for an update on the status of any delays or closures.  Dermatology Medication Tips: Please keep the boxes that topical medications come in in order to help keep track of the instructions about where and how to use these. Pharmacies typically print the medication instructions only on the boxes and not directly on the medication tubes.   If your medication is too expensive, please contact our office at [phone] option 4 or send [place] a message through [place].   We are unable to tell what your co-pay for medications will be in advance as this is different depending on your insurance coverage. However, we may be able to find a substitute medication at lower cost or fill out paperwork to get insurance to cover a needed medication.   If a prior authorization is required to get your medication covered by your insurance company, please allow [place] 1-2 business days to complete this process.  Drug prices often vary depending on where the prescription is filled and some pharmacies may offer cheaper prices.  The website www.goodrx.com contains coupons for medications through different pharmacies. The prices here do not account for what the cost may be with help from insurance (it may be cheaper with your insurance), but the  website can give you the price if you did not use any insurance.  - You can print the associated coupon and take it with your prescription to the pharmacy.  - You may also stop by our office during regular business hours and pick up a GoodRx coupon card.  - If you need your prescription sent electronically to a different pharmacy, notify our office through [facility] or by phone at [phone] option 4.

## 2021-05-14 ENCOUNTER — Other Ambulatory Visit: Payer: Self-pay | Admitting: Physician Assistant

## 2021-05-14 DIAGNOSIS — L97509 Non-pressure chronic ulcer of other part of unspecified foot with unspecified severity: Secondary | ICD-10-CM

## 2021-05-14 DIAGNOSIS — E11621 Type 2 diabetes mellitus with foot ulcer: Secondary | ICD-10-CM

## 2021-05-19 ENCOUNTER — Ambulatory Visit: Payer: Federal, State, Local not specified - PPO | Admitting: Dermatology

## 2021-05-23 ENCOUNTER — Ambulatory Visit: Payer: Medicare Other | Admitting: Family Medicine

## 2021-05-25 ENCOUNTER — Encounter: Payer: Self-pay | Admitting: Family Medicine

## 2021-05-25 ENCOUNTER — Ambulatory Visit (INDEPENDENT_AMBULATORY_CARE_PROVIDER_SITE_OTHER): Payer: Medicare Other | Admitting: Family Medicine

## 2021-05-25 ENCOUNTER — Other Ambulatory Visit: Payer: Self-pay

## 2021-05-25 VITALS — BP 111/65 | HR 63 | Temp 98.3°F | Resp 16 | Wt 223.6 lb

## 2021-05-25 DIAGNOSIS — L97509 Non-pressure chronic ulcer of other part of unspecified foot with unspecified severity: Secondary | ICD-10-CM

## 2021-05-25 DIAGNOSIS — Z6837 Body mass index (BMI) 37.0-37.9, adult: Secondary | ICD-10-CM | POA: Diagnosis not present

## 2021-05-25 DIAGNOSIS — E11621 Type 2 diabetes mellitus with foot ulcer: Secondary | ICD-10-CM | POA: Diagnosis not present

## 2021-05-25 DIAGNOSIS — Z23 Encounter for immunization: Secondary | ICD-10-CM | POA: Diagnosis not present

## 2021-05-25 LAB — POCT GLYCOSYLATED HEMOGLOBIN (HGB A1C): Hemoglobin A1C: 7.2 % — AB (ref 4.0–5.6)

## 2021-05-25 NOTE — Assessment & Plan Note (Signed)
37.21 today

## 2021-05-25 NOTE — Assessment & Plan Note (Signed)
-  htn -dm Discussed importance of healthy weight management Discussed diet and exercise

## 2021-05-25 NOTE — Progress Notes (Signed)
Established patient visit   Patient: Stacie Porter   DOB: 07/28/1946   75 y.o. Female  MRN: [id number] Visit Date: 05/25/2021  Today's healthcare provider: [name], FNP   Chief Complaint  Patient presents with   Diabetes   Hypertension   Subjective    HPI  Diabetes Mellitus Type II, follow-up  Lab Results  Component Value Date   HGBA1C 7.2 (A) 05/25/2021   HGBA1C 6.7 (A) 02/18/2021   HGBA1C 7.5 (H) 11/11/2020   Last seen for diabetes 3 months ago.  Management since then includes continuing the same treatment. She reports excellent compliance with treatment. She is not having side effects.   Home blood sugar records: trend: stable 160  Episodes of hypoglycemia? No    Current insulin regiment: none Most Recent Eye Exam: 03/29/2020  --------------------------------------------------------------------------------------------------- Hypertension, follow-up  BP Readings from Last 3 Encounters:  05/25/21 111/65  04/06/21 (!) 124/52  02/18/21 (!) 126/53   Wt Readings from Last 3 Encounters:  05/25/21 223 lb 9.6 oz (101.4 kg)  04/06/21 223 lb 12.8 oz (101.5 kg)  02/18/21 220 lb 1.6 oz (99.8 kg)     She was last seen for hypertension 3 months ago.  BP at that visit was 126/53 . Management since that visit includes none. She reports excellent compliance with treatment. She is not having side effects.  She is exercising. Walking 3-4x a week and weight lifts She is not adherent to low salt diet.   Outside blood pressures are systolic in the 782-956 and diastolic in the 21H.  She does not smoke.  Use of agents associated with hypertension: NSAIDS.   --------------------------------------------------------------------------------------------------- Lipid/Cholesterol, follow-up  Last Lipid Panel: Lab Results  Component Value Date   CHOL 164 11/11/2020   LDLCALC 96 11/11/2020   HDL 34 (L) 11/11/2020   TRIG 194 (H) 11/11/2020    She was last seen for  this 3 months ago.  Management since that visit includes none.  She reports excellent compliance with treatment. She is not having side effects.   Symptoms: No appetite changes No foot ulcerations  No chest pain No chest pressure/discomfort  No dyspnea No orthopnea  No fatigue No lower extremity edema  No palpitations No paroxysmal nocturnal dyspnea  No nausea No numbness or tingling of extremity  No polydipsia Yes polyuria  No speech difficulty No syncope   She is following a Regular diet. Current exercise: walking and weightlifting  Last metabolic panel Lab Results  Component Value Date   GLUCOSE 171 (H) 11/11/2020   NA 139 11/11/2020   K 4.4 11/11/2020   BUN 30 (H) 11/11/2020   CREATININE 0.99 11/11/2020   GFRNONAA 71 11/04/2019   GFRAA 82 11/04/2019   CALCIUM 9.6 11/11/2020   AST 25 11/11/2020   ALT 18 11/11/2020   The 10-year ASCVD risk score (Arnett DK, et al., 2019) is: 26.1%  ---------------------------------------------------------------------------------------------------   Medications: Outpatient Medications Prior to Visit  Medication Sig   aspirin (ASPIRIN LOW DOSE) 81 MG EC tablet Take 1 tablet (81 mg total) by mouth daily. Swallow whole.   Calcium Carbonate-Vitamin D 600-200 MG-UNIT CAPS Take 1 tablet by mouth daily.   FARXIGA 10 MG TABS tablet TAKE 1 TABLET BY MOUTH DAILY BEFORE BREAKFAST.   hydrochlorothiazide (HYDRODIURIL) 25 MG tablet Take 25 mg by mouth daily.   metoprolol tartrate (LOPRESSOR) 25 MG tablet Take 12.5 mg by mouth 2 (two) times daily.    MULTIPLE VITAMIN PO Take 1 tablet  by mouth daily.   Omega-3 Fatty Acids (FISH OIL) 1200 MG CAPS Take 1,200 mg by mouth daily.   omeprazole (PRILOSEC) 20 MG capsule Take 1 capsule (20 mg total) by mouth daily.   simvastatin (ZOCOR) 40 MG tablet Take 40 mg by mouth at bedtime.   zinc gluconate 50 MG tablet Take 50 mg by mouth daily.   lisinopril (PRINIVIL,ZESTRIL) 40 MG tablet Take 40 mg by mouth daily.     No facility-administered medications prior to visit.    Review of Systems     Objective    BP 111/65   Pulse 63   Temp 98.3 F (36.8 C) (Oral)   Resp 16   Wt 223 lb 9.6 oz (101.4 kg)   BMI 37.21 kg/m  {Show previous vital signs (optional):23777}  Physical Exam Vitals and nursing note reviewed.  Constitutional:      General: She is not in acute distress.    Appearance: Normal appearance. She is obese. She is not ill-appearing, toxic-appearing or diaphoretic.  HENT:     Head: Normocephalic and atraumatic.  Cardiovascular:     Rate and Rhythm: Normal rate and regular rhythm.     Pulses:          Dorsalis pedis pulses are 1+ on the right side and 1+ on the left side.       Posterior tibial pulses are 1+ on the right side and 1+ on the left side.     Heart sounds: Normal heart sounds. No murmur heard.   No friction rub. No gallop.  Pulmonary:     Effort: Pulmonary effort is normal. No respiratory distress.     Breath sounds: Normal breath sounds. No stridor. No wheezing, rhonchi or rales.  Chest:     Chest wall: No tenderness.  Abdominal:     General: Bowel sounds are normal.     Palpations: Abdomen is soft.  Musculoskeletal:        General: No swelling, tenderness, deformity or signs of injury. Normal range of motion.     Right lower leg: No edema.     Left lower leg: No edema.       Feet:  Feet:     Right foot:     Protective Sensation: 10 sites tested.  3 sites sensed.     Skin integrity: Skin integrity normal.     Left foot:     Protective Sensation: 10 sites tested.  8 sites sensed.     Skin integrity: Skin integrity normal.     Comments: 2nd toe surgically removed Skin:    General: Skin is warm and dry.     Capillary Refill: Capillary refill takes less than 2 seconds.     Coloration: Skin is not jaundiced or pale.     Findings: No bruising, erythema, lesion or rash.  Neurological:     General: No focal deficit present.     Mental Status: She is alert  and oriented to person, place, and time. Mental status is at baseline.     Cranial Nerves: No cranial nerve deficit.     Sensory: No sensory deficit.     Motor: No weakness.     Coordination: Coordination normal.  Psychiatric:        Mood and Affect: Mood normal.        Behavior: Behavior normal.        Thought Content: Thought content normal.        Judgment: Judgment normal.  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      Vonna Kotyk, FNP, have reviewed all documentation for this visit. The documentation on 05/25/21 for the exam, diagnosis, procedures, and orders are all accurate and complete.    [name], [place] [phone] (phone) [phone] (fax)  [place]

## 2021-05-25 NOTE — Assessment & Plan Note (Signed)
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

## 2021-05-25 NOTE — Addendum Note (Signed)
Addended by: Minette Headland on: 05/25/2021 04:34 PM   Modules accepted: Orders

## 2021-06-22 IMAGING — US US ABDOMEN LIMITED
1 series · 14 of 25 positions shown · non-contrast
Comparison: 04/25/2013 abdominal sonogram.

CLINICAL DATA: Abdominal bloating and belching.

EXAM:
ULTRASOUND ABDOMEN LIMITED RIGHT UPPER QUADRANT

[Series 1: us abdomen limited · 0.23mm/px · 14 of 56 slices shown]
[im 1/56]
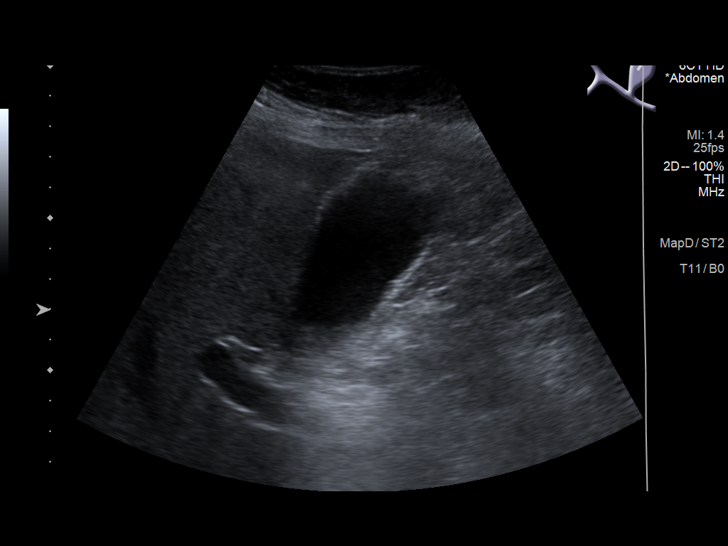
[im 5/56]
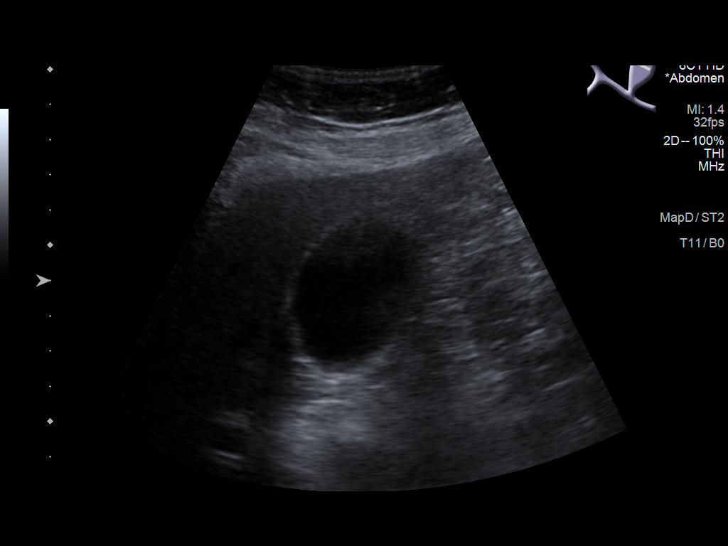
[im 10/56]
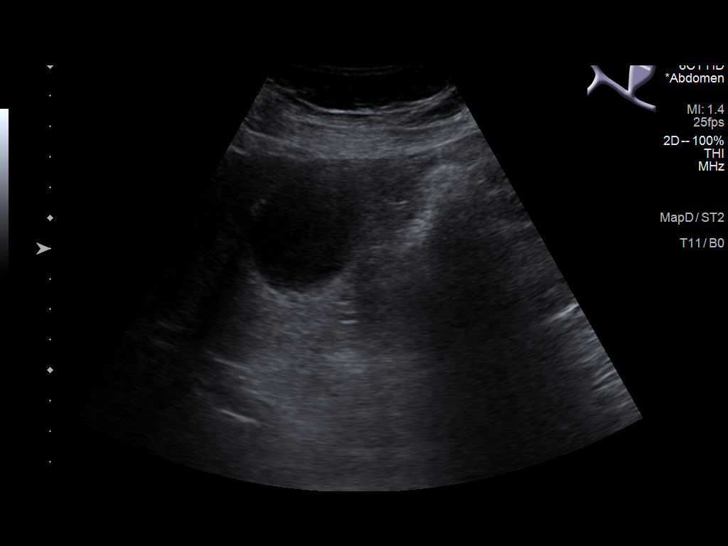
[im 14/56]
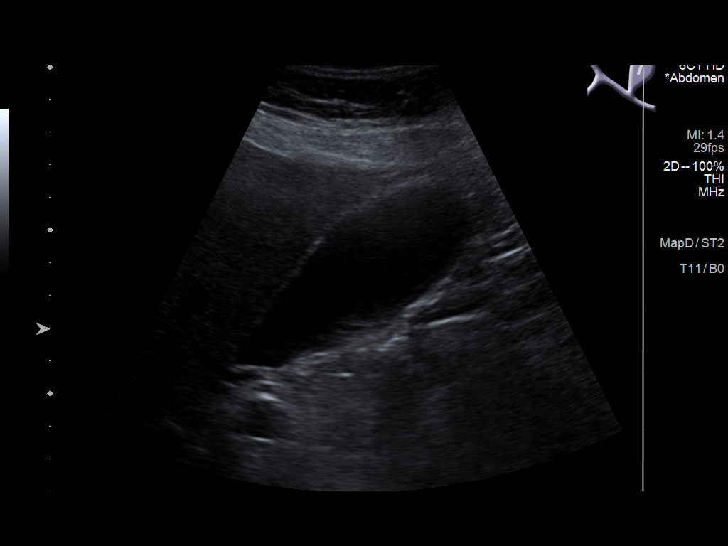
[im 19/56]
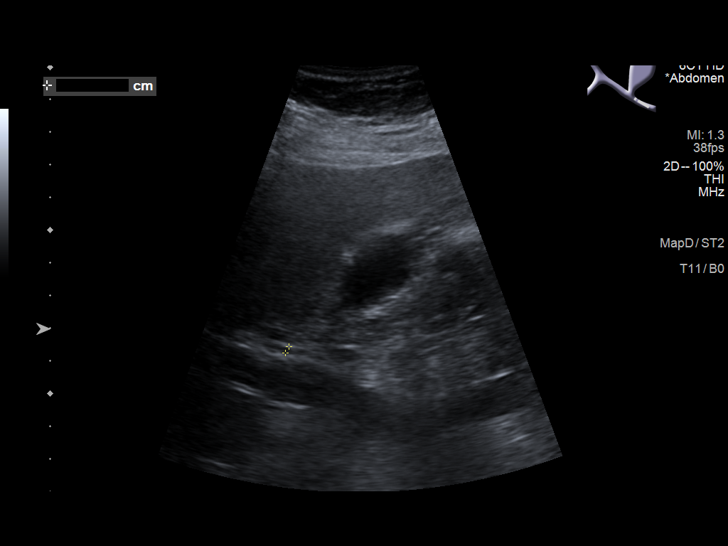
[im 21/56]
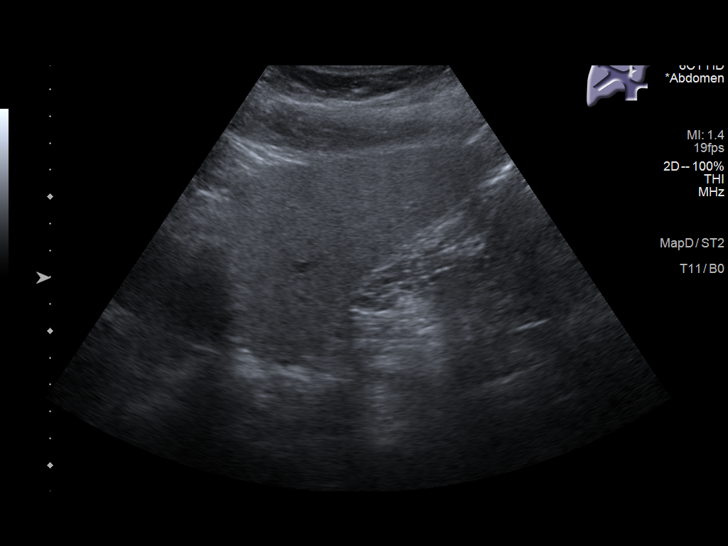
[im 26/56]
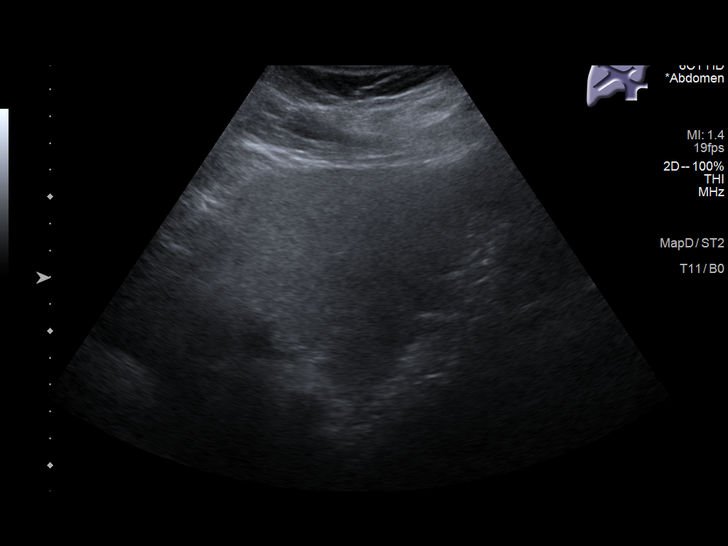
[im 30/56]
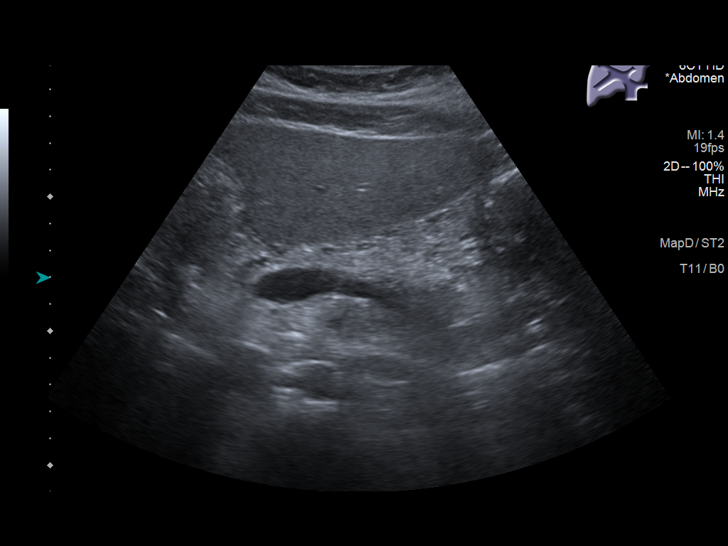
[im 35/56]
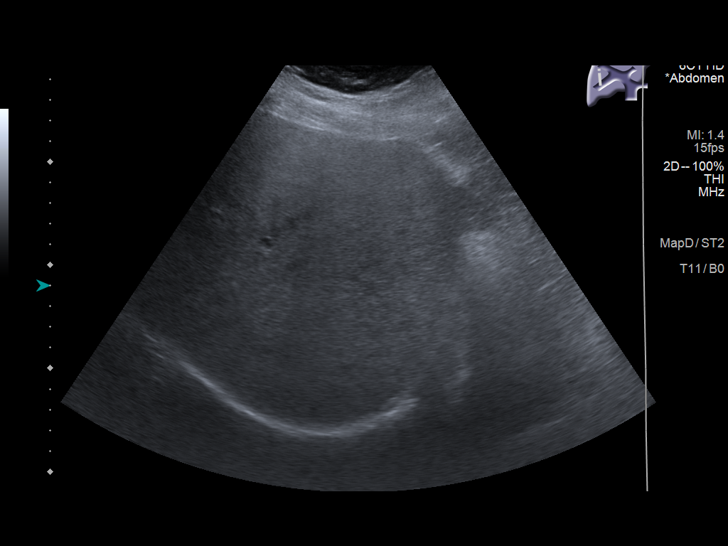
[im 37/56]
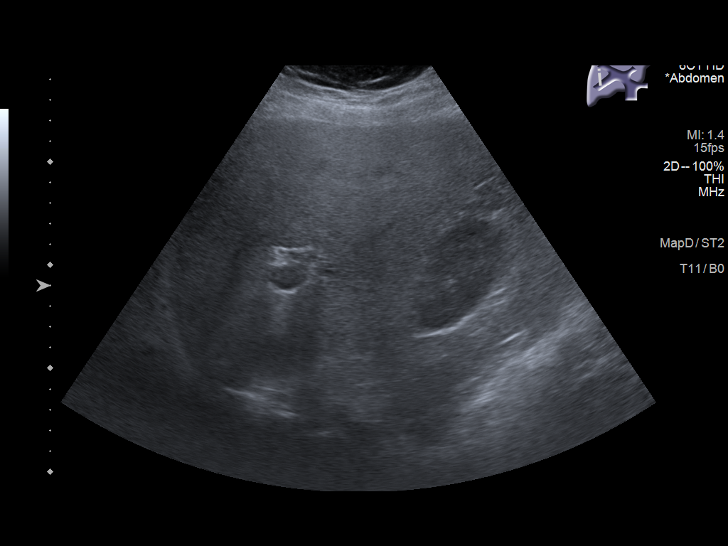
[im 42/56]
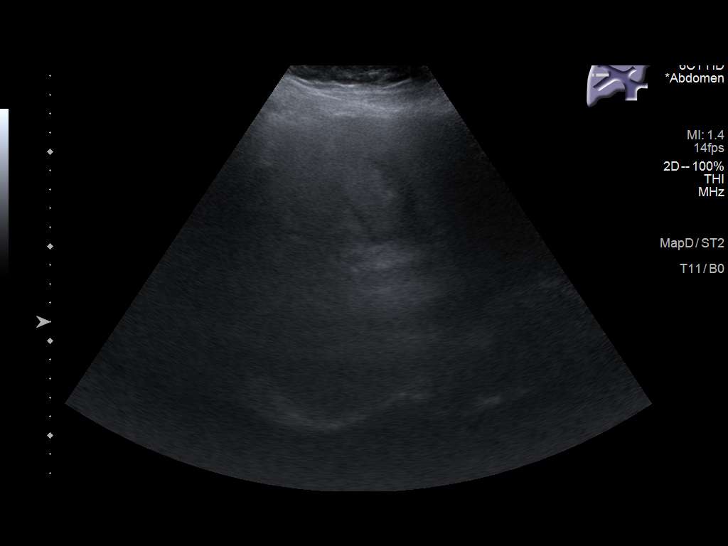
[im 46/56]
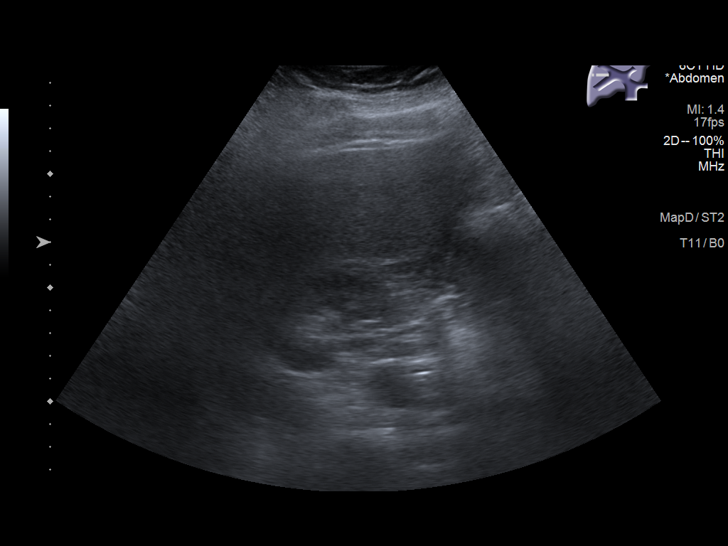
[im 51/56]
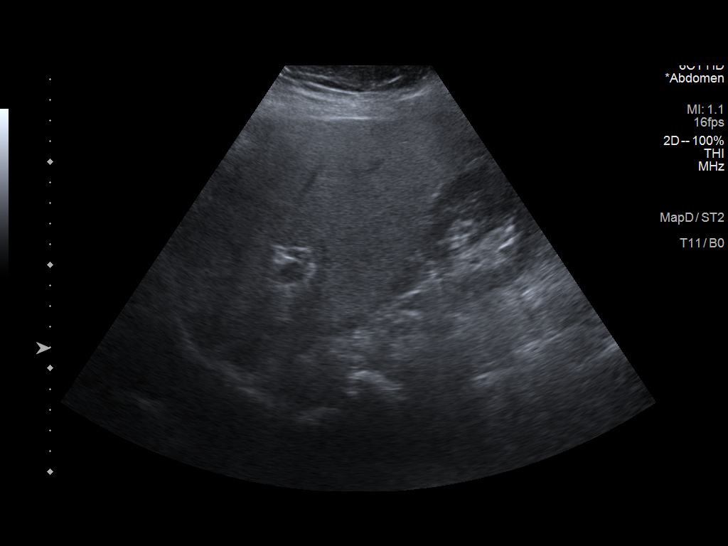
[im 56/56]
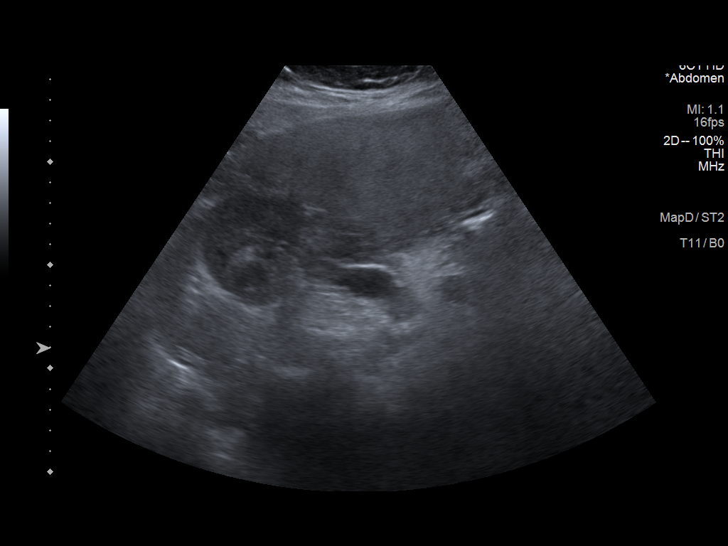

[14 of 25 positions shown; findings below may reference images not displayed]

FINDINGS: Gallbladder:

No gallstones or wall thickening visualized. No sonographic Murphy
sign noted by sonographer.

Common bile duct:

Diameter: 2 mm

Liver:

Diffusely moderately echogenic liver parenchyma with posterior
acoustic attenuation, compatible with diffuse hepatic steatosis. No
definite liver surface irregularity. No liver masses, noting
decreased sensitivity in the setting of an echogenic liver. Portal
vein is patent on color Doppler imaging with normal direction of
blood flow towards the liver.

Other: None.
IMPRESSION: 1. Diffuse hepatic steatosis.
2. Otherwise normal right upper quadrant abdominal sonogram. No
cholelithiasis.

## 2021-06-27 ENCOUNTER — Telehealth: Payer: Self-pay

## 2021-06-27 NOTE — Telephone Encounter (Signed)
Spoke with patient and advised she needed to go to an urgent care due to [facility] not having any appts available.   She wanted [place] to call in something but advised she had to have an appt to get medication.

## 2021-06-27 NOTE — Telephone Encounter (Signed)
Copied from [place] [id number]. Topic: General - Other >> Jun 27, 2021  7:55 AM Leward Quan A wrote: Reason for CRM: Patient called in to say that she is experiencing a head cold, coughing and feeling congested in her head and also loosing her voice would like to be seen today by Tally Joe or have something sent to the pharmacy Please call patient at Ph# [phone]

## 2021-06-28 ENCOUNTER — Ambulatory Visit
Admission: RE | Admit: 2021-06-28 | Discharge: 2021-06-28 | Disposition: A | Discharge status: Home / Self Care | Payer: Medicare Other | Source: Ambulatory Visit | Attending: Family Medicine | Admitting: Family Medicine

## 2021-06-28 ENCOUNTER — Other Ambulatory Visit: Payer: Self-pay

## 2021-06-28 VITALS — BP 150/63 | HR 60 | Temp 98.2°F | Resp 18

## 2021-06-28 DIAGNOSIS — B349 Viral infection, unspecified: Secondary | ICD-10-CM

## 2021-06-28 NOTE — Discharge Instructions (Addendum)
Take Tylenol or ibuprofen as needed for fever or discomfort.  Continue over-the-counter treatment for congestion and cough.  Follow up with your primary care provider if your symptoms are not improving.

## 2021-06-28 NOTE — ED Triage Notes (Signed)
Pt presents with non-productive cough and hoarseness x 3 days. No fever, HA, SOB.

## 2021-06-28 NOTE — ED Provider Notes (Signed)
Stacie Porter    CSN: [id number] Arrival date & time: 06/28/21  0931      History   Chief Complaint Chief Complaint  Patient presents with   Cough    APPT 945    HPI Stacie Porter is a 75 y.o. female.  Patient presents with 3-day history of congestion, hoarse voice, nonproductive cough.  Treatment at home with Mucinex.  She denies fever, rash, sore throat, shortness of breath, vomiting, diarrhea, or other symptoms.  Her medical history includes hypertension, MI, atrial fibrillation, diabetes, morbid obesity.  The history is provided by the patient and medical records.   Past Medical History:  Diagnosis Date   Diabetes mellitus without complication ([place])    type 2   Dysrhythmia    a fib   Hx of basal cell carcinoma ~2014 [facility]   Nose   Hyperlipidemia    Hypertension    Osteomyelitis of second toe of right foot ([place]) 11/11/2018   Sleep apnea    cpap    Patient Active Problem List   Diagnosis Date Noted   BMI 37.0-37.9, adult 05/25/2021   Epidermal cyst 04/06/2021   Encounter for screening colonoscopy    Polyp of descending colon    OSA (obstructive sleep apnea) 09/28/2020   Fatty liver 07/18/2019   Type 2 diabetes mellitus with foot ulcer, without long-term current use of insulin ([place]) 10/04/2018   Hyperlipidemia associated with type 2 diabetes mellitus ([place]) 07/12/2015   MI (mitral incompetence) 07/12/2015   Paroxysmal atrial fibrillation ([place]) 07/12/2015   Morbid obesity ([place]) 05/17/2015   Esophagitis, reflux 05/17/2015   Hypertension associated with diabetes ([place]) 05/17/2015   TI (tricuspid incompetence) 01/11/2015   H/O malignant neoplasm of skin 02/05/2013   Essential thrombocythemia ([place]) 10/01/2012   History of shingles 09/16/2007    Past Surgical History:  Procedure Laterality Date   AMPUTATION TOE Right 11/11/2018   Procedure: AMPUTATION RIGHT 2ND TOE;  Surgeon: Dorna Leitz, MD;  Location: [facility];  Service: Orthopedics;  Laterality: Right;    APPENDECTOMY     CARDIAC ELECTROPHYSIOLOGY STUDY AND ABLATION     cather ablation  2011/2014   cardiac ablation for a fib   COLONOSCOPY WITH PROPOFOL N/A 02/11/2021   Procedure: COLONOSCOPY WITH PROPOFOL;  Surgeon: Virgel Manifold, MD;  Location: [facility] ENDOSCOPY;  Service: Endoscopy;  Laterality: N/A;   EYE SURGERY Bilateral 05/2014   cataract   HAMMER TOE SURGERY Right    HAND SURGERY Bilateral    LAPAROSCOPIC OOPHERECTOMY     recession of gums sx     TUBAL LIGATION      OB History     Gravida  3   Para  3   Term      Preterm      AB      Living         SAB      IAB      Ectopic      Multiple      Live Births               Home Medications    Prior to Admission medications   Medication Sig Start Date End Date Taking? Authorizing Provider  aspirin (ASPIRIN LOW DOSE) 81 MG EC tablet Take 1 tablet (81 mg total) by mouth daily. Swallow whole. 10/21/20  Yes Mar Daring, PA-C  Calcium Carbonate-Vitamin D 600-200 MG-UNIT CAPS Take 1 tablet by mouth daily. 01/18/07  Yes [provider]  Wilder Glade  10 MG TABS tablet TAKE 1 TABLET BY MOUTH DAILY BEFORE BREAKFAST. 05/23/21  Yes [name], FNP  lisinopril (PRINIVIL,ZESTRIL) 40 MG tablet Take 40 mg by mouth daily.  07/30/17 06/28/21 Yes [provider]  metoprolol tartrate (LOPRESSOR) 25 MG tablet Take 12.5 mg by mouth 2 (two) times daily.    Yes [provider]  MULTIPLE VITAMIN PO Take 1 tablet by mouth daily. 01/18/07  Yes [provider]  Omega-3 Fatty Acids (FISH OIL) 1200 MG CAPS Take 1,200 mg by mouth daily.   Yes [provider]  omeprazole (PRILOSEC) 20 MG capsule Take 1 capsule (20 mg total) by mouth daily. 01/31/21  Yes Birdie Sons, MD  simvastatin (ZOCOR) 40 MG tablet Take 40 mg by mouth at bedtime. 10/10/20  Yes [provider]  zinc gluconate 50 MG tablet Take 50 mg by mouth daily.   Yes [provider]  hydrochlorothiazide  (HYDRODIURIL) 25 MG tablet Take 25 mg by mouth daily.    [provider]    Family History Family History  Problem Relation Age of Onset   Ovarian cancer Sister    Cancer Sister        ovarian   Lung cancer Sister    Cancer Sister        lung and thyroid   Thyroid cancer Sister    Heart disease Mother    Heart disease Father    Hypertension Father    Diabetes Maternal Uncle    Diabetes Maternal Uncle    Breast cancer Neg Hx     Social History Social History   Tobacco Use   Smoking status: Never   Smokeless tobacco: Never  Vaping Use   Vaping Use: Never used  Substance Use Topics   Alcohol use: No   Drug use: No     Allergies   Ibuprofen, Meloxicam, and Multaq [dronedarone]   Review of Systems Review of Systems  Constitutional:  Negative for chills and fever.  HENT:  Positive for congestion and voice change. Negative for ear pain and sore throat.   Respiratory:  Positive for cough. Negative for shortness of breath.   Cardiovascular:  Negative for chest pain and palpitations.  Gastrointestinal:  Negative for diarrhea and vomiting.  Skin:  Negative for color change and rash.  All other systems reviewed and are negative.   Physical Exam Triage Vital Signs ED Triage Vitals  Enc Vitals Group     BP 06/28/21 0955 (!) 150/63     Pulse Rate 06/28/21 0955 60     Resp 06/28/21 0955 18     Temp 06/28/21 0955 98.2 F (36.8 C)     Temp Source 06/28/21 0955 Oral     SpO2 06/28/21 0955 94 %     Weight --      Height --      Head Circumference --      Peak Flow --      Pain Score 06/28/21 0957 0     Pain Loc --      Pain Edu? --      Excl. in [place]? --    No data found.  Updated Vital Signs BP (!) 150/63 (BP Location: Left Arm)   Pulse 60   Temp 98.2 F (36.8 C) (Oral)   Resp 18   SpO2 94%   Visual Acuity Right Eye Distance:   Left Eye Distance:   Bilateral Distance:    Right Eye Near:   Left Eye Near:  Bilateral Near:     Physical  Exam Vitals and nursing note reviewed.  Constitutional:      General: She is not in acute distress.    Appearance: She is well-developed. She is obese. She is not ill-appearing.  HENT:     Head: Normocephalic and atraumatic.     Right Ear: Tympanic membrane normal.     Left Ear: Tympanic membrane normal.     Nose: Nose normal.     Mouth/Throat:     Mouth: Mucous membranes are moist.     Pharynx: Oropharynx is clear.  Eyes:     Conjunctiva/sclera: Conjunctivae normal.  Cardiovascular:     Rate and Rhythm: Normal rate and regular rhythm.     Heart sounds: Normal heart sounds.  Pulmonary:     Effort: Pulmonary effort is normal. No respiratory distress.     Breath sounds: Normal breath sounds.  Abdominal:     Palpations: Abdomen is soft.     Tenderness: There is no abdominal tenderness.  Musculoskeletal:     Cervical back: Neck supple.  Skin:    General: Skin is warm and dry.  Neurological:     Mental Status: She is alert.  Psychiatric:        Mood and Affect: Mood normal.        Behavior: Behavior normal.     UC Treatments / Results  Labs (all labs ordered are listed, but only abnormal results are displayed) Labs Reviewed - No data to display  EKG   Radiology No results found.  Procedures Procedures (including critical care time)  Medications Ordered in UC Medications - No data to display  Initial Impression / Assessment and Plan / UC Course  I have reviewed the triage vital signs and the nursing notes.  Pertinent labs & imaging results that were available during my care of the patient were reviewed by me and considered in my medical decision making (see chart for details).    Viral illness.  Patient declines COVID or flu test.  She is well-appearing and her exam is reassuring.  Instructed her to continue symptomatic treatment including Tylenol or ibuprofen, plain Mucinex, rest, hydration.  Instructed her to follow-up with her PCP if her symptoms are not  improving.  She agrees to plan of care.  Final Clinical Impressions(s) / UC Diagnoses   Final diagnoses:  Viral illness     Discharge Instructions      Take Tylenol or ibuprofen as needed for fever or discomfort.  Continue over-the-counter treatment for congestion and cough.  Follow up with your primary care provider if your symptoms are not improving.        ED Prescriptions   None    PDMP not reviewed this encounter.   Sharion Balloon, NP 06/28/21 1019

## 2021-08-10 ENCOUNTER — Other Ambulatory Visit: Payer: Self-pay | Admitting: Family Medicine

## 2021-08-10 DIAGNOSIS — Z1231 Encounter for screening mammogram for malignant neoplasm of breast: Secondary | ICD-10-CM

## 2021-09-01 ENCOUNTER — Encounter: Payer: Self-pay | Admitting: Family Medicine

## 2021-09-05 ENCOUNTER — Encounter: Payer: Self-pay | Admitting: Family Medicine

## 2021-09-05 ENCOUNTER — Ambulatory Visit (INDEPENDENT_AMBULATORY_CARE_PROVIDER_SITE_OTHER): Payer: Medicare Other | Admitting: Family Medicine

## 2021-09-05 ENCOUNTER — Other Ambulatory Visit: Payer: Self-pay

## 2021-09-05 VITALS — BP 130/70 | HR 59 | Resp 16 | Wt 220.5 lb

## 2021-09-05 DIAGNOSIS — E785 Hyperlipidemia, unspecified: Secondary | ICD-10-CM

## 2021-09-05 DIAGNOSIS — H9190 Unspecified hearing loss, unspecified ear: Secondary | ICD-10-CM | POA: Insufficient documentation

## 2021-09-05 DIAGNOSIS — D473 Essential (hemorrhagic) thrombocythemia: Secondary | ICD-10-CM

## 2021-09-05 DIAGNOSIS — K2100 Gastro-esophageal reflux disease with esophagitis, without bleeding: Secondary | ICD-10-CM | POA: Diagnosis not present

## 2021-09-05 DIAGNOSIS — E1159 Type 2 diabetes mellitus with other circulatory complications: Secondary | ICD-10-CM | POA: Diagnosis not present

## 2021-09-05 DIAGNOSIS — I480 Paroxysmal atrial fibrillation: Secondary | ICD-10-CM

## 2021-09-05 DIAGNOSIS — E11621 Type 2 diabetes mellitus with foot ulcer: Secondary | ICD-10-CM | POA: Diagnosis not present

## 2021-09-05 DIAGNOSIS — L97509 Non-pressure chronic ulcer of other part of unspecified foot with unspecified severity: Secondary | ICD-10-CM | POA: Diagnosis not present

## 2021-09-05 DIAGNOSIS — I152 Hypertension secondary to endocrine disorders: Secondary | ICD-10-CM

## 2021-09-05 DIAGNOSIS — E1169 Type 2 diabetes mellitus with other specified complication: Secondary | ICD-10-CM

## 2021-09-05 DIAGNOSIS — Z23 Encounter for immunization: Secondary | ICD-10-CM | POA: Insufficient documentation

## 2021-09-05 DIAGNOSIS — R001 Bradycardia, unspecified: Secondary | ICD-10-CM | POA: Insufficient documentation

## 2021-09-05 LAB — POCT GLYCOSYLATED HEMOGLOBIN (HGB A1C): Hemoglobin A1C: 6.9 % — AB (ref 4.0–5.6)

## 2021-09-05 MED ORDER — LISINOPRIL 40 MG PO TABS: 40.0000 mg | ORAL_TABLET | Freq: Every day | ORAL | 11 refills | Status: DC

## 2021-09-05 MED ORDER — OMEPRAZOLE 20 MG PO CPDR: 20.0000 mg | DELAYED_RELEASE_CAPSULE | Freq: Every day | ORAL | 11 refills | Status: DC

## 2021-09-05 MED ORDER — HYDROCHLOROTHIAZIDE 25 MG PO TABS: 25.0000 mg | ORAL_TABLET | Freq: Every day | ORAL | 11 refills | Status: DC

## 2021-09-05 MED ORDER — DAPAGLIFLOZIN PROPANEDIOL 10 MG PO TABS: 10.0000 mg | ORAL_TABLET | Freq: Every day | ORAL | 11 refills | Status: DC

## 2021-09-05 MED ORDER — ZOSTER VAC RECOMB ADJUVANTED 50 MCG/0.5ML IM SUSR: 0.5000 mL | Freq: Once | INTRAMUSCULAR | 0 refills | Status: AC

## 2021-09-05 MED ORDER — METOPROLOL TARTRATE 25 MG PO TABS: 12.5000 mg | ORAL_TABLET | Freq: Two times a day (BID) | ORAL | 11 refills | Status: DC

## 2021-09-05 MED ORDER — SIMVASTATIN 40 MG PO TABS: 40.0000 mg | ORAL_TABLET | Freq: Every day | ORAL | 11 refills | Status: DC

## 2021-09-05 NOTE — Assessment & Plan Note (Signed)
Complaints of difficulty hearing with TV and with mask wearing; request referral to ENT for hearing evaluation for hearing aids

## 2021-09-05 NOTE — Assessment & Plan Note (Signed)
Labs ordered for annual CPE upcoming in 10/2021 Stable prior

## 2021-09-05 NOTE — Assessment & Plan Note (Signed)
Hx of shingles- benign course Discussed risk/benefits of vaccination Paper rx provided

## 2021-09-05 NOTE — Assessment & Plan Note (Signed)
Chronic, stable Denies complaints s/p ablation SB on exam today

## 2021-09-05 NOTE — Assessment & Plan Note (Signed)
Chronic, stable Continue low fat diet and exercise as tolerated

## 2021-09-05 NOTE — Assessment & Plan Note (Signed)
Remains on PPI d/t chronic complaints of esophagitis

## 2021-09-05 NOTE — Assessment & Plan Note (Signed)
S/p toe removal; stable neuropathy per pt report

## 2021-09-05 NOTE — Assessment & Plan Note (Signed)
Hx of pafib s/p ablation; SB on exam HR of 59

## 2021-09-05 NOTE — Assessment & Plan Note (Signed)
BMI 36.69 A/w HTN. HLD, DM Discussed importance of healthy weight management Discussed diet and exercise

## 2021-09-05 NOTE — Assessment & Plan Note (Signed)
Chronic, stable Home readings WDL Elevated readings at OV Slight weight loss noted Denies difficulties with medications  Continues to work on exercise/diet

## 2021-09-05 NOTE — Progress Notes (Signed)
Established patient visit   Patient: Stacie Porter   DOB: 18-Feb-1946   76 y.o. Female  MRN: [id number] Visit Date: 09/05/2021  Today's healthcare provider: [name], FNP   Chief Complaint  Patient presents with   Diabetes   Hypertension   Hyperlipidemia   Subjective    HPI  Diabetes Mellitus Type II, Follow-up  Lab Results  Component Value Date   HGBA1C 6.9 (A) 09/05/2021   HGBA1C 7.2 (A) 05/25/2021   HGBA1C 6.7 (A) 02/18/2021   Wt Readings from Last 3 Encounters:  09/05/21 220 lb 8 oz (100 kg)  05/25/21 223 lb 9.6 oz (101.4 kg)  04/06/21 223 lb 12.8 oz (101.5 kg)   Last seen for diabetes 3 months ago.  Management since then includes focus on lifestyle modification. She reports excellent compliance with treatment. She is not having side effects.  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  Episodes of hypoglycemia? No    Current insulin regiment: none Most Recent Eye Exam: within 12 months Current exercise: home exercise  Current diet habits: well balanced  Pertinent Labs: Lab Results  Component Value Date   CHOL 164 11/11/2020   HDL 34 (L) 11/11/2020   LDLCALC 96 11/11/2020   TRIG 194 (H) 11/11/2020   CHOLHDL 4.0 09/23/2018   Lab Results  Component Value Date   NA 139 11/11/2020   K 4.4 11/11/2020   CREATININE 0.99 11/11/2020   EGFR 60 11/11/2020   MICROALBUR negative 07/10/2019     ---------------------------------------------------------------------------------------------------  Hypertension, follow-up  BP Readings from Last 3  Encounters:  09/05/21 130/70  06/28/21 (!) 150/63  05/25/21 111/65   Wt Readings from Last 3 Encounters:  09/05/21 220 lb 8 oz (100 kg)  05/25/21 223 lb 9.6 oz (101.4 kg)  04/06/21 223 lb 12.8 oz (101.5 kg)     She was last seen for hypertension 3 months ago.  BP at that visit was 111/65. Management since that visit includes patient reports that she is taking 1/2 tablet daily.  She reports excellent compliance with treatment. She is not having side effects.  She is following a Regular diet. She is exercising. She does not smoke.  Use of agents associated with hypertension: NSAIDS.   Outside blood pressures are systolic 546-503T and diastolic in the 46F. Symptoms: No chest pain No chest pressure  No palpitations No syncope  No dyspnea No orthopnea  No paroxysmal nocturnal dyspnea No lower extremity edema   Pertinent labs: Lab Results  Component Value Date   CHOL 164 11/11/2020   HDL 34 (L) 11/11/2020   LDLCALC 96 11/11/2020   TRIG 194 (H) 11/11/2020   CHOLHDL 4.0 09/23/2018   Lab Results  Component Value Date   NA 139 11/11/2020   K 4.4 11/11/2020   CREATININE 0.99 11/11/2020   EGFR 60 11/11/2020   GLUCOSE 171 (H) 11/11/2020   TSH 2.070 11/11/2020     The 10-year ASCVD risk score (Arnett DK, et al., 2019) is: 34%   ---------------------------------------------------------------------------------------------------  Lipid/Cholesterol, Follow-up  Last lipid panel Other pertinent labs  Lab Results  Component Value Date   CHOL 164 11/11/2020   HDL 34 (L) 11/11/2020   LDLCALC 96 11/11/2020   TRIG 194 (H) 11/11/2020   CHOLHDL 4.0 09/23/2018   Lab Results  Component Value Date   ALT 18 11/11/2020   AST 25 11/11/2020   PLT 117 (L) 11/11/2020   TSH 2.070 11/11/2020     She was last seen for this 3 months ago.  Management since that visit includes none.  She reports excellent compliance with treatment. She is not having side effects.   Symptoms: No chest  pain No chest pressure/discomfort  No dyspnea No lower extremity edema  Yes numbness or tingling of extremity No orthopnea  No palpitations No paroxysmal nocturnal dyspnea  No speech difficulty No syncope   Current diet: well balanced Current exercise:  at home exercise  The 10-year ASCVD risk score (Arnett DK, et al., 2019) is: 34%  ---------------------------------------------------------------------------------------------------   Medications: Outpatient Medications Prior to Visit  Medication Sig   aspirin (ASPIRIN LOW DOSE) 81 MG EC tablet Take 1 tablet (81 mg total) by mouth daily. Swallow whole.   Calcium Carbonate-Vitamin D 600-200 MG-UNIT CAPS Take 1 tablet by mouth daily.   MULTIPLE VITAMIN PO Take 1 tablet by mouth daily.   Omega-3 Fatty Acids (FISH OIL) 1200 MG CAPS Take 1,200 mg by mouth daily.   zinc gluconate 50 MG tablet Take 50 mg by mouth daily.   [DISCONTINUED] FARXIGA 10 MG TABS tablet TAKE 1 TABLET BY MOUTH DAILY BEFORE BREAKFAST.   [DISCONTINUED] hydrochlorothiazide (HYDRODIURIL) 25 MG tablet Take 25 mg by mouth daily.   [DISCONTINUED] lisinopril (PRINIVIL,ZESTRIL) 40 MG tablet Take 40 mg by mouth daily.    [DISCONTINUED] metoprolol tartrate (LOPRESSOR) 25 MG tablet Take 12.5 mg by mouth 2 (two) times daily.    [DISCONTINUED] omeprazole (PRILOSEC) 20 MG capsule Take 1 capsule (20 mg total) by mouth daily.   [DISCONTINUED] simvastatin (ZOCOR) 40 MG tablet Take 40 mg by mouth at bedtime.   No facility-administered medications prior to visit.    Review of Systems     Objective    BP 130/70 Comment: home reading   Pulse (!) 59    Resp 16    Wt 220 lb 8 oz (100 kg)    SpO2 98%    BMI 36.69 kg/m    Physical Exam Vitals and nursing note reviewed.  Constitutional:      General: She is not in acute distress.    Appearance: Normal appearance. She is obese. She is not ill-appearing, toxic-appearing or diaphoretic.  HENT:     Head: Normocephalic and atraumatic.      Ears:     Comments: Complaints of difficulty hearing with TV and with mask wearing; request referral to ENT for hearing evaluation for hearing aids Cardiovascular:     Rate and Rhythm: Normal rate and regular rhythm.     Pulses: Normal pulses.     Heart sounds: Normal heart sounds. No murmur heard.   No friction rub. No gallop.  Pulmonary:     Effort: Pulmonary effort is normal. No respiratory distress.     Breath sounds: Normal breath sounds. No stridor. No wheezing, rhonchi or rales.  Chest:     Chest wall: No tenderness.  Abdominal:     General: Bowel sounds are normal.     Palpations: Abdomen is soft.  Musculoskeletal:        General: No swelling, tenderness,  deformity or signs of injury. Normal range of motion.     Right lower leg: No edema.     Left lower leg: No edema.  Skin:    General: Skin is warm and dry.     Capillary Refill: Capillary refill takes less than 2 seconds.     Coloration: Skin is not jaundiced or pale.     Findings: No bruising, erythema, lesion or rash.  Neurological:     General: No focal deficit present.     Mental Status: She is alert and oriented to person, place, and time. Mental status is at baseline.     Cranial Nerves: No cranial nerve deficit.     Sensory: No sensory deficit.     Motor: No weakness.     Coordination: Coordination normal.  Psychiatric:        Mood and Affect: Mood normal.        Behavior: Behavior normal.        Thought Content: Thought content normal.        Judgment: Judgment normal.     Results for orders placed or performed in visit on 09/05/21  POCT glycosylated hemoglobin (Hb A1C)  Result Value Ref Range   Hemoglobin A1C 6.9 (A) 4.0 - 5.6 %   HbA1c POC (<> result, manual entry)     HbA1c, POC (prediabetic range)     HbA1c, POC (controlled diabetic range)      Assessment & Plan     Problem List Items Addressed This Visit       Cardiovascular and Mediastinum   Hypertension associated with diabetes ([place])     Chronic, stable Home readings WDL Elevated readings at OV Slight weight loss noted Denies difficulties with medications  Continues to work on exercise/diet       Relevant Medications   dapagliflozin propanediol (FARXIGA) 10 MG TABS tablet   hydrochlorothiazide (HYDRODIURIL) 25 MG tablet   lisinopril (ZESTRIL) 40 MG tablet   metoprolol tartrate (LOPRESSOR) 25 MG tablet   simvastatin (ZOCOR) 40 MG tablet   Other Relevant Orders   Comprehensive metabolic panel   Paroxysmal atrial fibrillation (HCC)    Chronic, stable Denies complaints s/p ablation SB on exam today      Relevant Medications   hydrochlorothiazide (HYDRODIURIL) 25 MG tablet   lisinopril (ZESTRIL) 40 MG tablet   metoprolol tartrate (LOPRESSOR) 25 MG tablet   simvastatin (ZOCOR) 40 MG tablet     Digestive   Esophagitis, reflux    Remains on PPI d/t chronic complaints of esophagitis       Relevant Medications   omeprazole (PRILOSEC) 20 MG capsule   Other Relevant Orders   CBC with Differential/Platelet     Endocrine   Hyperlipidemia associated with type 2 diabetes mellitus (HCC)    Chronic, stable Continue low fat diet and exercise as tolerated       Relevant Medications   dapagliflozin propanediol (FARXIGA) 10 MG TABS tablet   hydrochlorothiazide (HYDRODIURIL) 25 MG tablet   lisinopril (ZESTRIL) 40 MG tablet   metoprolol tartrate (LOPRESSOR) 25 MG tablet   simvastatin (ZOCOR) 40 MG tablet   Other Relevant Orders   Lipid panel   Type 2 diabetes mellitus with foot ulcer, without long-term current use of insulin (HCC) - Primary    S/p toe removal; stable neuropathy per pt report      Relevant Medications   dapagliflozin propanediol (FARXIGA) 10 MG TABS tablet   lisinopril (ZESTRIL) 40 MG tablet   simvastatin (ZOCOR) 40  MG tablet   Other Relevant Orders   POCT glycosylated hemoglobin (Hb A1C) (Completed)   Urine Microalbumin w/creat. ratio     Nervous and Auditory   Hearing loss    Complaints  of difficulty hearing with TV and with mask wearing; request referral to ENT for hearing evaluation for hearing aids       Relevant Orders   Ambulatory referral to ENT     Hematopoietic and Hemostatic   Essential thrombocythemia ([place])    Labs ordered for annual CPE upcoming in 10/2021 Stable prior        Other   Morbid obesity ([place])    BMI 36.69 A/w HTN. HLD, DM Discussed importance of healthy weight management Discussed diet and exercise       Relevant Medications   dapagliflozin propanediol (FARXIGA) 10 MG TABS tablet   Need for shingles vaccine    Hx of shingles- benign course Discussed risk/benefits of vaccination Paper rx provided      Relevant Medications   Zoster Vaccine Adjuvanted [facility]) injection   Bradycardia with 51-60 beats per minute    Hx of pafib s/p ablation; SB on exam HR of 59        Return for annual examination.      Vonna Kotyk, FNP, have reviewed all documentation for this visit. The documentation on 09/05/21 for the exam, diagnosis, procedures, and orders are all accurate and complete.    [name], [place] [phone] (phone) [phone] (fax)  [place]

## 2021-09-19 ENCOUNTER — Ambulatory Visit: Payer: Self-pay

## 2021-09-19 NOTE — Telephone Encounter (Signed)
°  Chief Complaint: elevated BS Symptoms: BS 182 Frequency: been elevated all week Pertinent Negatives: Patient denies hyperglycemia Disposition: [] ED /[] Urgent Care (no appt availability in office) / [x] Appointment(In office/virtual)/ []  [facility]/ [] Home Care/ [] Refused Recommended Disposition /[] [place] [facility]/ []  Follow-up with PCP Additional Notes: Pt wanted to schedule an appt d/t BS have been elevated all week. She states she is eating healthier, exercising, and drinking more water so doesn't understand why its been running higher than normal.   Summary: Blood Sugar Concerns   Pt is calling to express some concerns regarding her blood Sugar. Pt is concerned about her readings for 160 (today 8:00A) 184 (today 10:30a) 182 (today 3:30pm) Please advise        Reason for Disposition  Blood glucose 70-240 mg/dL (3.9 -13.3 mmol/L)  Answer Assessment - Initial Assessment Questions 1. BLOOD GLUCOSE: "What is your blood glucose level?"      182 2. ONSET: "When did you check the blood glucose?"     Been elevated for a week, 330 last time she checked 5. TYPE 1 or 2:  "Do you know what type of diabetes you have?"  (e.g., Type 1, Type 2, Gestational; doesn't know)      DM 2 6. INSULIN: "Do you take insulin?" "What type of insulin(s) do you use? What is the mode of delivery? (syringe, pen; injection or pump)?"      No 7. DIABETES PILLS: "Do you take any pills for your diabetes?" If yes, ask: "Have you missed taking any pills recently?"     yes 8. OTHER SYMPTOMS: "Do you have any symptoms?" (e.g., fever, frequent urination, difficulty breathing, dizziness, weakness, vomiting)     No  Protocols used: Diabetes - High Blood Sugar-A-AH

## 2021-09-20 ENCOUNTER — Ambulatory Visit
Admission: RE | Admit: 2021-09-20 | Discharge: 2021-09-20 | Disposition: A | Discharge status: Home / Self Care | Payer: Medicare Other | Source: Ambulatory Visit | Attending: Family Medicine | Admitting: Family Medicine

## 2021-09-20 ENCOUNTER — Other Ambulatory Visit: Payer: Self-pay

## 2021-09-20 ENCOUNTER — Ambulatory Visit (INDEPENDENT_AMBULATORY_CARE_PROVIDER_SITE_OTHER): Payer: Medicare Other | Admitting: Family Medicine

## 2021-09-20 ENCOUNTER — Encounter: Payer: Self-pay | Admitting: Family Medicine

## 2021-09-20 VITALS — BP 140/82 | HR 60 | Resp 16 | Wt 220.0 lb

## 2021-09-20 DIAGNOSIS — L97509 Non-pressure chronic ulcer of other part of unspecified foot with unspecified severity: Secondary | ICD-10-CM

## 2021-09-20 DIAGNOSIS — E11621 Type 2 diabetes mellitus with foot ulcer: Secondary | ICD-10-CM | POA: Diagnosis not present

## 2021-09-20 DIAGNOSIS — Z1231 Encounter for screening mammogram for malignant neoplasm of breast: Secondary | ICD-10-CM | POA: Insufficient documentation

## 2021-09-20 NOTE — Progress Notes (Signed)
Established patient visit   Patient: Stacie Porter   DOB: [birth year]   76 y.o. Female  MRN: [id number] Visit Date: 09/20/2021  Today's healthcare provider: Lavon Paganini, MD   Chief Complaint  Patient presents with   Diabetes   Subjective     Hyperglycemia Concerned about morning fasting blood sugars in 180s  Last A1c from 09/05/21 was 6.9 Has occasionally been eating a small snack around 7-8pm according to advice from nutrition educator She has been exercising consistently  Medications: Outpatient Medications Prior to Visit  Medication Sig   aspirin (ASPIRIN LOW DOSE) 81 MG EC tablet Take 1 tablet (81 mg total) by mouth daily. Swallow whole.   Calcium Carbonate-Vitamin D 600-200 MG-UNIT CAPS Take 1 tablet by mouth daily.   dapagliflozin propanediol (FARXIGA) 10 MG TABS tablet Take 1 tablet (10 mg total) by mouth daily before breakfast.   hydrochlorothiazide (HYDRODIURIL) 25 MG tablet Take 1 tablet (25 mg total) by mouth daily. (Patient taking differently: Take 25 mg by mouth daily.)   lisinopril (ZESTRIL) 40 MG tablet Take 1 tablet (40 mg total) by mouth daily.   metoprolol tartrate (LOPRESSOR) 25 MG tablet Take 0.5 tablets (12.5 mg total) by mouth 2 (two) times daily.   MULTIPLE VITAMIN PO Take 1 tablet by mouth daily.   Omega-3 Fatty Acids (FISH OIL) 1200 MG CAPS Take 1,200 mg by mouth daily.   omeprazole (PRILOSEC) 20 MG capsule Take 1 capsule (20 mg total) by mouth daily.   simvastatin (ZOCOR) 40 MG tablet Take 1 tablet (40 mg total) by mouth at bedtime.   zinc gluconate 50 MG tablet Take 50 mg by mouth daily.   No facility-administered medications prior to visit.    Review of Systems  Constitutional: Negative.  Negative for appetite change and fatigue.  Gastrointestinal:  Negative for abdominal pain, diarrhea and nausea.  Endocrine: Negative for polyphagia.  Psychiatric/Behavioral: Negative.    All other systems reviewed and are negative.     Objective     BP 140/82 (BP Location: Left Arm, Patient Position: Sitting, Cuff Size: Large)    Pulse 60    Resp 16    Wt 220 lb (99.8 kg)    BMI 36.61 kg/m  {Show previous vital signs (optional):23777}  Physical Exam Vitals reviewed.  Constitutional:      General: She is not in acute distress.    Appearance: Normal appearance. She is not ill-appearing or toxic-appearing.  HENT:     Head: Normocephalic and atraumatic.     Right Ear: External ear normal.     Left Ear: External ear normal.     Nose: Nose normal.     Mouth/Throat:     Mouth: Mucous membranes are moist.     Pharynx: Oropharynx is clear. No oropharyngeal exudate or posterior oropharyngeal erythema.  Eyes:     General: No scleral icterus.    Extraocular Movements: Extraocular movements intact.     Conjunctiva/sclera: Conjunctivae normal.     Pupils: Pupils are equal, round, and reactive to light.  Cardiovascular:     Rate and Rhythm: Normal rate and regular rhythm.     Pulses: Normal pulses.     Heart sounds: Normal heart sounds. No murmur heard.   No friction rub. No gallop.  Pulmonary:     Effort: Pulmonary effort is normal. No respiratory distress.     Breath sounds: Normal breath sounds. No wheezing or rhonchi.  Chest:     Chest wall: No  tenderness.  Abdominal:     General: Abdomen is flat. There is no distension.     Palpations: Abdomen is soft.     Tenderness: There is no abdominal tenderness.  Musculoskeletal:        General: Normal range of motion.     Cervical back: Normal range of motion and neck supple.     Right lower leg: No edema.     Left lower leg: No edema.  Skin:    General: Skin is warm and dry.     Capillary Refill: Capillary refill takes less than 2 seconds.     Findings: No lesion or rash.  Neurological:     General: No focal deficit present.     Mental Status: She is alert and oriented to person, place, and time. Mental status is at baseline.  Psychiatric:        Mood and Affect: Mood normal.      No results found for any visits on 09/20/21.  Assessment & Plan     Problem List Items Addressed This Visit       Endocrine   Type 2 diabetes mellitus with foot ulcer, without long-term current use of insulin (HCC) - Primary    Chronic A1c 6.9 on 09/05/21  Discussed appropriate fasting blood sugar numbers and counseled on diet On ACEi On statin  Did not tolerate metformin Continue Farxiga Recheck A1c at next visit and if A1c >7.0, consider starting GLP-1 agonist F/u in 3 months         Return in about 3 months (around 12/19/2021) for chronic disease f/u.      Nelva Nay, Medical Student 09/20/2021, 12:09 PM   Patient seen along with MS3 student Nelva Nay. I personally evaluated this patient along with the student, and verified all aspects of the history, physical exam, and medical decision making as documented by the student. I agree with the student's documentation and have made all necessary edits.  Norberta Stobaugh, Dionne Bucy, MD, MPH [facility]

## 2021-09-20 NOTE — Assessment & Plan Note (Signed)
Chronic A1c 6.9 on 09/05/21  Discussed appropriate fasting blood sugar numbers and counseled on diet On ACEi On statin  Did not tolerate metformin Continue Farxiga Recheck A1c at next visit and if A1c >7.0, consider starting GLP-1 agonist F/u in 3 months

## 2021-09-20 NOTE — Patient Instructions (Addendum)
°  Diet Recommendations for Diabetes   Starchy (carb) foods include: Bread, rice, pasta, potatoes, corn, crackers, bagels, muffins, all baked goods.  (Fruits, milk, and yogurt also have carbohydrate, but most of these foods will not spike your blood sugar as the starchy foods will.)  A few fruits do cause high blood sugars; use small portions of bananas (limit to 1/2 at a time), grapes, watermelon, and most tropical fruits.    Protein foods include: Meat, fish, poultry, eggs, dairy foods, and beans such as pinto and kidney beans (beans also provide carbohydrate).   1. Limit starchy foods to TWO per meal and ONE per snack. ONE portion of a starchy  food is equal to the following:   - ONE slice of bread (or its equivalent, such as half of a hamburger bun).   - 1/2 cup of a "scoopable" starchy food such as potatoes or rice.   - 15 grams of carbohydrate as shown on food label.  2. Include at every meal: a protein food, a carb food, and vegetables and/or fruit.   - Obtain twice as many veg's as protein or carbohydrate foods for both lunch and dinner.   - Fresh or frozen veg's are best.   - Try to keep frozen veg's on hand for a quick vegetable serving.

## 2021-09-22 ENCOUNTER — Ambulatory Visit (INDEPENDENT_AMBULATORY_CARE_PROVIDER_SITE_OTHER): Payer: Medicare Other | Admitting: Dermatology

## 2021-09-22 ENCOUNTER — Other Ambulatory Visit: Payer: Self-pay

## 2021-09-22 DIAGNOSIS — L820 Inflamed seborrheic keratosis: Secondary | ICD-10-CM | POA: Diagnosis not present

## 2021-09-22 NOTE — Progress Notes (Signed)
° °  Follow-Up Visit   Subjective  Stacie Porter is a 76 y.o. female who presents for the following: Skin Problem (Check growth on the side of the right eye x 1 month will not go away, area irritating and growing ).  The following portions of the chart were reviewed this encounter and updated as appropriate:   Tobacco   Allergies   Meds   Problems   Med Hx   Surg Hx   Fam Hx      Review of Systems:  No other skin or systemic complaints except as noted in HPI or Assessment and Plan.  Objective  Well appearing patient in no apparent distress; mood and affect are within normal limits.  A focused examination was performed including face. Relevant physical exam findings are noted in the Assessment and Plan.  right temple x 2 (2) Stuck-on, waxy, tan-brown papules   Assessment & Plan  Inflamed seborrheic keratosis (2) right temple x 2  Destruction of lesion - right temple x 2 Complexity: simple   Destruction method: cryotherapy   Informed consent: discussed and consent obtained   Timeout:  patient name, date of birth, surgical site, and procedure verified Lesion destroyed using liquid nitrogen: Yes   Region frozen until ice ball extended beyond lesion: Yes   Outcome: patient tolerated procedure well with no complications   Post-procedure details: wound care instructions given     Return if symptoms worsen or fail to improve.  IMarye Round, CMA, am acting as scribe for Sarina Ser, MD .  Documentation: I have reviewed the above documentation for accuracy and completeness, and I agree with the above.  Sarina Ser, MD

## 2021-09-22 NOTE — Patient Instructions (Addendum)

## 2021-09-23 ENCOUNTER — Encounter: Payer: Self-pay | Admitting: Dermatology

## 2021-09-26 ENCOUNTER — Telehealth: Payer: Self-pay

## 2021-09-26 NOTE — Telephone Encounter (Signed)
Copied from [place] [phone]. Topic: General - Other >> Sep 26, 2021 10:55 AM Erick Blinks wrote: Reason for CRM: Elta Guadeloupe from [place] called to notify PCP that the fax that was received on 09/22/2021 for the [facility] needs correspondence urgently.   Best contact: [phone]

## 2021-09-27 NOTE — Telephone Encounter (Signed)
Checking on the status if fax was received [employer] and requesting last office notes. Please note when received. Caller states its been 1 week.

## 2021-09-27 NOTE — Telephone Encounter (Signed)
Form was received and has been faxed. KW

## 2021-09-30 ENCOUNTER — Other Ambulatory Visit: Payer: Self-pay | Admitting: Physician Assistant

## 2021-10-19 ENCOUNTER — Other Ambulatory Visit: Payer: Self-pay | Admitting: Family Medicine

## 2021-10-19 ENCOUNTER — Encounter: Payer: Self-pay | Admitting: Family Medicine

## 2021-10-19 DIAGNOSIS — B3731 Acute candidiasis of vulva and vagina: Secondary | ICD-10-CM | POA: Insufficient documentation

## 2021-10-19 DIAGNOSIS — E11621 Type 2 diabetes mellitus with foot ulcer: Secondary | ICD-10-CM

## 2021-10-19 MED ORDER — FLUCONAZOLE 150 MG PO TABS: ORAL_TABLET | ORAL | 0 refills | Status: DC

## 2021-10-19 MED ORDER — ASPIRIN 81 MG PO TBEC: 81.0000 mg | DELAYED_RELEASE_TABLET | Freq: Every day | ORAL | 3 refills | Status: DC

## 2021-10-20 NOTE — Telephone Encounter (Signed)
Call to pharmacy- verifies Rx received and is ready for patient pick up.

## 2021-11-09 ENCOUNTER — Ambulatory Visit: Payer: Self-pay

## 2021-11-09 NOTE — Telephone Encounter (Signed)
?  Chief Complaint: hyperglycemia ?Symptoms: none ?Frequency: 1 week  ?Pertinent Negatives: NA ?Disposition: '[]'$ ED /'[]'$ Urgent Care (no appt availability in office) / '[x]'$ Appointment(In office/virtual)/ '[]'$  [facility]/ '[]'$ Home Care/ '[]'$ Refused Recommended Disposition /'[]'$ [facility]/ '[]'$  Follow-up with PCP ?Additional Notes: pt states her BS have been elevated for 1 week now and unsure why. She hasn't done anything different but pt was wanting to know what she can go to get it down. Advised her to increase fluids and she reports she is doing that.Pt wanted to come in and see PCP to discuss.  ? ? ?Summary: blood sugar concern  ? The patient's continuous glucose monitor has alerted them this morning that their blood sugar was more than 250  ? ?The patient tested their sugar was their meter indicated it was 230  ? ?The patient would like to discuss further when possible   ?  ? ?Reason for Disposition ? [1] Blood glucose 240 - 300 mg/dL (13.3 - 16.7 mmol/L) AND [2] does not  use insulin (e.g., not insulin-dependent; most people with type 2 diabetes) ? ?Answer Assessment - Initial Assessment Questions ?1. BLOOD GLUCOSE: "What is your blood glucose level?"  ?    250 ?2. ONSET: "When did you check the blood glucose?" ?    This morning  ?3. USUAL RANGE: "What is your glucose level usually?" (e.g., usual fasting morning value, usual evening value) ?    150-160 ?5. TYPE 1 or 2:  "Do you know what type of diabetes you have?"  (e.g., Type 1, Type 2, Gestational; doesn't know)  ?    DM 2 ?6. INSULIN: "Do you take insulin?" "What type of insulin(s) do you use? What is the mode of delivery? (syringe, pen; injection or pump)?"  ?    No ?7. DIABETES PILLS: "Do you take any pills for your diabetes?" If yes, ask: "Have you missed taking any pills recently?" ?    Farixga  ?8. OTHER SYMPTOMS: "Do you have any symptoms?" (e.g., fever, frequent urination, difficulty breathing, dizziness, weakness, vomiting) ?     No ? ?Protocols used: Diabetes - High Blood Sugar-A-AH ? ?

## 2021-11-11 ENCOUNTER — Ambulatory Visit (INDEPENDENT_AMBULATORY_CARE_PROVIDER_SITE_OTHER): Payer: Medicare Other | Admitting: Family Medicine

## 2021-11-11 ENCOUNTER — Encounter: Payer: Self-pay | Admitting: Family Medicine

## 2021-11-11 ENCOUNTER — Other Ambulatory Visit: Payer: Self-pay

## 2021-11-11 VITALS — BP 148/70 | HR 67 | Temp 97.8°F | Resp 16 | Wt 220.0 lb

## 2021-11-11 DIAGNOSIS — E118 Type 2 diabetes mellitus with unspecified complications: Secondary | ICD-10-CM | POA: Insufficient documentation

## 2021-11-11 DIAGNOSIS — E1165 Type 2 diabetes mellitus with hyperglycemia: Secondary | ICD-10-CM

## 2021-11-11 NOTE — Progress Notes (Signed)
?  ? ? ?Established patient visit ? ?I,April Miller,acting as a scribe for [name], FNP.,have documented all relevant documentation on the behalf of [name], FNP,as directed by  [name], FNP while in the presence of [name], FNP. ? ? ?Patient: Stacie Porter   DOB: 12-Feb-1946   76 y.o. Female  MRN: [id number] ?Visit Date: 11/11/2021 ? ?Today's healthcare provider: [name], FNP  ? ?Re Introduced to nurse practitioner role and practice setting.  All questions answered.  Discussed provider/patient relationship and expectations. ? ? ?Chief Complaint  ?Patient presents with  ? Diabetes  ? Follow-up  ? ?Subjective  ?  ?HPI  ?Diabetes Mellitus Type II, Follow-up ? ?Lab Results  ?Component Value Date  ? HGBA1C 6.9 (A) 09/05/2021  ? HGBA1C 7.2 (A) 05/25/2021  ? HGBA1C 6.7 (A) 02/18/2021  ? ?Wt Readings from Last 3 Encounters:  ?11/11/21 220 lb (99.8 kg)  ?09/20/21 220 lb (99.8 kg)  ?09/05/21 220 lb 8 oz (100 kg)  ? ?Last seen for diabetes 2 months ago.  ?Management since then includes; continue [place]. ?She reports good compliance with treatment. ?She is not having side effects. none ? ? ?Home blood sugar records: fasting range: 171  in past 14 days; 166 in past 28 days ? ?Episodes of hypoglycemia? no  ?  ?Current insulin regiment: none ?Most Recent Eye Exam: 03/31/2021 ?Current exercise: walking ?Current diet habits: well balanced ? ?Pertinent Labs: ?Lab Results  ?Component Value Date  ? CHOL 164 11/11/2020  ? HDL 34 (L) 11/11/2020  ? [place] 96 11/11/2020  ? TRIG 194 (H) 11/11/2020  ? CHOLHDL 4.0 09/23/2018  ? Lab Results  ?Component Value Date  ? NA 139 11/11/2020  ? K 4.4 11/11/2020  ? CREATININE 0.99 11/11/2020  ? EGFR 60 11/11/2020  ? MICROALBUR negative 07/10/2019  ?  ? ?--------------------------------------------------------------- ? ? ?Medications: ?Outpatient Medications Prior to Visit  ?Medication Sig  ? aspirin (ASPIRIN LOW DOSE) 81 MG EC tablet Take 1 tablet (81 mg total) by mouth  daily. SWALLOW WHOLE.  ? Calcium Carbonate-Vitamin D 600-200 MG-UNIT CAPS Take 1 tablet by mouth daily.  ? dapagliflozin propanediol (FARXIGA) 10 MG TABS tablet Take 1 tablet (10 mg total) by mouth daily before breakfast.  ? hydrochlorothiazide (HYDRODIURIL) 25 MG tablet Take 1 tablet (25 mg total) by mouth daily. (Patient taking differently: Take 25 mg by mouth daily.)  ? lisinopril (ZESTRIL) 40 MG tablet Take 1 tablet (40 mg total) by mouth daily.  ? metoprolol tartrate (LOPRESSOR) 25 MG tablet Take 0.5 tablets (12.5 mg total) by mouth 2 (two) times daily.  ? MULTIPLE VITAMIN PO Take 1 tablet by mouth daily.  ? Omega-3 Fatty Acids (FISH OIL) 1200 MG CAPS Take 1,200 mg by mouth daily.  ? omeprazole (PRILOSEC) 20 MG capsule Take 1 capsule (20 mg total) by mouth daily.  ? simvastatin (ZOCOR) 40 MG tablet Take 1 tablet (40 mg total) by mouth at bedtime.  ? zinc gluconate 50 MG tablet Take 50 mg by mouth daily.  ? [DISCONTINUED] fluconazole (DIFLUCAN) 150 MG tablet Take 1 tablet PO and if symptoms remain after 3 days, on day 4- take the second tablet PO.  ? ?No facility-administered medications prior to visit.  ? ? ?Review of Systems  ?Constitutional:  Negative for appetite change, chills, fatigue and fever.  ?Respiratory:  Negative for chest tightness and shortness of breath.   ?Cardiovascular:  Negative for chest pain and palpitations.  ?Gastrointestinal:  Negative for abdominal pain, nausea and vomiting.  ?Neurological:  Negative for dizziness and weakness.  ? ? ?  Objective  ?  ?BP (!) 148/70 (BP Location: Right Arm, Patient Position: Sitting, Cuff Size: Large)   Pulse 67   Temp 97.8 ?F (36.6 ?C) (Temporal)   Resp 16   Wt 220 lb (99.8 kg)   SpO2 96%   BMI 36.61 kg/m?  ? ? ?Physical Exam ?Vitals and nursing note reviewed.  ?Constitutional:   ?   General: She is not in acute distress. ?   Appearance: Normal appearance. She is obese. She is not ill-appearing, toxic-appearing or diaphoretic.  ?HENT:  ?   Head:  Normocephalic and atraumatic.  ?Cardiovascular:  ?   Rate and Rhythm: Normal rate and regular rhythm.  ?   Pulses: Normal pulses.  ?   Heart sounds: Normal heart sounds. No murmur heard. ?  No friction rub. No gallop.  ?Pulmonary:  ?   Effort: Pulmonary effort is normal. No respiratory distress.  ?   Breath sounds: Normal breath sounds. No stridor. No wheezing, rhonchi or rales.  ?Chest:  ?   Chest wall: No tenderness.  ?Abdominal:  ?   General: Bowel sounds are normal.  ?   Palpations: Abdomen is soft.  ?Musculoskeletal:     ?   General: No swelling, tenderness, deformity or signs of injury. Normal range of motion.  ?   Right lower leg: No edema.  ?   Left lower leg: No edema.  ?Skin: ?   General: Skin is warm and dry.  ?   Capillary Refill: Capillary refill takes less than 2 seconds.  ?   Coloration: Skin is not jaundiced or pale.  ?   Findings: No bruising, erythema, lesion or rash.  ?Neurological:  ?   General: No focal deficit present.  ?   Mental Status: She is alert and oriented to person, place, and time. Mental status is at baseline.  ?   Cranial Nerves: No cranial nerve deficit.  ?   Sensory: No sensory deficit.  ?   Motor: No weakness.  ?   Coordination: Coordination normal.  ?Psychiatric:     ?   Mood and Affect: Mood normal.     ?   Behavior: Behavior normal.     ?   Thought Content: Thought content normal.     ?   Judgment: Judgment normal.  ?  ? ?No results found for any visits on 11/11/21. ? Assessment & Plan  ?  ? ?Problem List Items Addressed This Visit   ? ?  ? Endocrine  ? Type 2 diabetes mellitus with hyperglycemia, without long-term current use of insulin (HCC) - Primary  ?  Chronic, previously well controlled upon review of last A1c 6.9% ?Now has CGM and is reporting BG levels >200 in mid mornings consistent with HYPERglycemia ?Eats what she thinks is a "balanced" meal ?-2 slices [place] bacon ?-bowl of fruit (strawberries, blueberries, apples) ?-[employer] homemade "bagel" (made with greek yogurt and  flour) ? ?Reassurance provided on 14/28 day data review, consistent with previous A1c levels. Encourage trial/error of adding more protein ex. More yogurt, eggs, etc or taking out apples ? ?Print out of glycemic index foods provided ? ?RTC in 1 month for scheduled a1c ?Continue to recommend balanced, lower carb meals. Smaller meal size, adding snacks. Choosing water as drink of choice and increasing purposeful exercise. ? ?  ?  ? ? ? ?Return in about 4 weeks (  around 12/09/2021) for T2DM management.  ?   ? ?I, [name], FNP, have reviewed all documentation for this visit. The documentation on 11/11/21 for the exam, diagnosis, procedures, and orders are all accurate and complete. ? ? ? ?[name], FNP  ?[facility] ?[phone] (phone) ?[phone] (fax) ? ? [facility] ?

## 2021-11-11 NOTE — Assessment & Plan Note (Signed)
Chronic, previously well controlled upon review of last A1c 6.9% ?Now has CGM and is reporting BG levels >200 in mid mornings consistent with HYPERglycemia ?Eats what she thinks is a "balanced" meal ?-2 slices [place] bacon ?-bowl of fruit (strawberries, blueberries, apples) ?-[employer] homemade "bagel" (made with greek yogurt and flour) ? ?Reassurance provided on 14/28 day data review, consistent with previous A1c levels. Encourage trial/error of adding more protein ex. More yogurt, eggs, etc or taking out apples ? ?Print out of glycemic index foods provided ? ?RTC in 1 month for scheduled a1c ?Continue to recommend balanced, lower carb meals. Smaller meal size, adding snacks. Choosing water as drink of choice and increasing purposeful exercise. ? ?

## 2021-11-21 ENCOUNTER — Encounter: Payer: Self-pay | Admitting: Family Medicine

## 2021-11-22 ENCOUNTER — Ambulatory Visit (INDEPENDENT_AMBULATORY_CARE_PROVIDER_SITE_OTHER): Payer: Medicare Other

## 2021-11-22 VITALS — Wt 220.0 lb

## 2021-11-22 DIAGNOSIS — Z0000 Encounter for general adult medical examination without abnormal findings: Secondary | ICD-10-CM | POA: Diagnosis not present

## 2021-11-22 NOTE — Patient Instructions (Addendum)
Ms. Jerez , ?Thank you for taking time to come for your Medicare Wellness Visit. I appreciate your ongoing commitment to your health goals. Please review the following plan we discussed and let me know if I can assist you in the future.  ? ?Screening recommendations/referrals: ?Colonoscopy: 02/11/21, aged out ?Mammogram: 09/20/21, aged out ?Bone Density: 07/04/12, declined referral ?Recommended yearly ophthalmology/optometry visit for glaucoma screening and checkup ?Recommended yearly dental visit for hygiene and checkup ? ?Vaccinations: ?Influenza vaccine: 05/25/21 ?Pneumococcal vaccine: 09/23/14 ?Tdap vaccine: 09/23/14 ?Shingles vaccine: Shingrix 11/21/21   ?Covid-19:10/09/19, 11/06/19  ? ?Advanced directives: yes ? ?Conditions/risks identified: none ? ?Next appointment: Follow up in one year for your annual wellness visit - 11/27/22 @ 8:14am by phone ? ? ?Preventive Care 76 Years and Older, Female ?Preventive care refers to lifestyle choices and visits with your health care provider that can promote health and wellness. ?What does preventive care include? ?A yearly physical exam. This is also called an annual well check. ?Dental exams once or twice a year. ?Routine eye exams. Ask your health care provider how often you should have your eyes checked. ?Personal lifestyle choices, including: ?Daily care of your teeth and gums. ?Regular physical activity. ?Eating a healthy diet. ?Avoiding tobacco and drug use. ?Limiting alcohol use. ?Practicing safe sex. ?Taking low-dose aspirin every day. ?Taking vitamin and mineral supplements as recommended by your health care provider. ?What happens during an annual well check? ?The services and screenings done by your health care provider during your annual well check will depend on your age, overall health, lifestyle risk factors, and family history of disease. ?Counseling  ?Your health care provider may ask you questions about your: ?Alcohol use. ?Tobacco use. ?Drug use. ?Emotional  well-being. ?Home and relationship well-being. ?Sexual activity. ?Eating habits. ?History of falls. ?Memory and ability to understand (cognition). ?Work and work Statistician. ?Reproductive health. ?Screening  ?You may have the following tests or measurements: ?Height, weight, and BMI. ?Blood pressure. ?Lipid and cholesterol levels. These may be checked every 5 years, or more frequently if you are over 53 years old. ?Skin check. ?Lung cancer screening. You may have this screening every year starting at age 95 if you have a 30-pack-year history of smoking and currently smoke or have quit within the past 15 years. ?Fecal occult blood test (FOBT) of the stool. You may have this test every year starting at age 36. ?Flexible sigmoidoscopy or colonoscopy. You may have a sigmoidoscopy every 5 years or a colonoscopy every 10 years starting at age 49. ?Hepatitis C blood test. ?Hepatitis B blood test. ?Sexually transmitted disease (STD) testing. ?Diabetes screening. This is done by checking your blood sugar (glucose) after you have not eaten for a while (fasting). You may have this done every 1-3 years. ?Bone density scan. This is done to screen for osteoporosis. You may have this done starting at age 95. ?Mammogram. This may be done every 1-2 years. Talk to your health care provider about how often you should have regular mammograms. ?Talk with your health care provider about your test results, treatment options, and if necessary, the need for more tests. ?Vaccines  ?Your health care provider may recommend certain vaccines, such as: ?Influenza vaccine. This is recommended every year. ?Tetanus, diphtheria, and acellular pertussis (Tdap, Td) vaccine. You may need a Td booster every 10 years. ?Zoster vaccine. You may need this after age 33. ?Pneumococcal 13-valent conjugate (PCV13) vaccine. One dose is recommended after age 77. ?Pneumococcal polysaccharide (PPSV23) vaccine. One dose is recommended after  age 53. ?Talk to your  health care provider about which screenings and vaccines you need and how often you need them. ?This information is not intended to replace advice given to you by your health care provider. Make sure you discuss any questions you have with your health care provider. ?Document Released: 09/10/2015 Document Revised: 05/03/2016 Document Reviewed: 06/15/2015 ?Elsevier Interactive Patient Education ? 2017 [place]. ? ?Fall Prevention in the Home ?Falls can cause injuries. They can happen to people of all ages. There are many things you can do to make your home safe and to help prevent falls. ?What can I do on the outside of my home? ?Regularly fix the edges of walkways and driveways and fix any cracks. ?Remove anything that might make you trip as you walk through a door, such as a raised step or threshold. ?Trim any bushes or trees on the path to your home. ?Use bright outdoor lighting. ?Clear any walking paths of anything that might make someone trip, such as rocks or tools. ?Regularly check to see if handrails are loose or broken. Make sure that both sides of any steps have handrails. ?Any raised decks and porches should have guardrails on the edges. ?Have any leaves, snow, or ice cleared regularly. ?Use sand or salt on walking paths during winter. ?Clean up any spills in your garage right away. This includes oil or grease spills. ?What can I do in the bathroom? ?Use night lights. ?Install grab bars by the toilet and in the tub and shower. Do not use towel bars as grab bars. ?Use non-skid mats or decals in the tub or shower. ?If you need to sit down in the shower, use a plastic, non-slip stool. ?Keep the floor dry. Clean up any water that spills on the floor as soon as it happens. ?Remove soap buildup in the tub or shower regularly. ?Attach bath mats securely with double-sided non-slip rug tape. ?Do not have throw rugs and other things on the floor that can make you trip. ?What can I do in the bedroom? ?Use night  lights. ?Make sure that you have a light by your bed that is easy to reach. ?Do not use any sheets or blankets that are too big for your bed. They should not hang down onto the floor. ?Have a firm chair that has side arms. You can use this for support while you get dressed. ?Do not have throw rugs and other things on the floor that can make you trip. ?What can I do in the kitchen? ?Clean up any spills right away. ?Avoid walking on wet floors. ?Keep items that you use a lot in easy-to-reach places. ?If you need to reach something above you, use a strong step stool that has a grab bar. ?Keep electrical cords out of the way. ?Do not use floor polish or wax that makes floors slippery. If you must use wax, use non-skid floor wax. ?Do not have throw rugs and other things on the floor that can make you trip. ?What can I do with my stairs? ?Do not leave any items on the stairs. ?Make sure that there are handrails on both sides of the stairs and use them. Fix handrails that are broken or loose. Make sure that handrails are as long as the stairways. ?Check any carpeting to make sure that it is firmly attached to the stairs. Fix any carpet that is loose or worn. ?Avoid having throw rugs at the top or bottom of the stairs. If you do  have throw rugs, attach them to the floor with carpet tape. ?Make sure that you have a light switch at the top of the stairs and the bottom of the stairs. If you do not have them, ask someone to add them for you. ?What else can I do to help prevent falls? ?Wear shoes that: ?Do not have high heels. ?Have rubber bottoms. ?Are comfortable and fit you well. ?Are closed at the toe. Do not wear sandals. ?If you use a stepladder: ?Make sure that it is fully opened. Do not climb a closed stepladder. ?Make sure that both sides of the stepladder are locked into place. ?Ask someone to hold it for you, if possible. ?Clearly mark and make sure that you can see: ?Any grab bars or handrails. ?First and last  steps. ?Where the edge of each step is. ?Use tools that help you move around (mobility aids) if they are needed. These include: ?Canes. ?Walkers. ?Scooters. ?Crutches. ?Turn on the lights when you go into a dark ar

## 2021-11-22 NOTE — Progress Notes (Signed)
?Virtual Visit via Telephone Note ? ?I connected with  Stacie Porter on 11/22/21 at  9:00 AM EDT by telephone and verified that I am speaking with the correct person using two identifiers. ? ?Location: ?Patient: home ?Provider: [facility] ?Persons participating in the virtual visit: patient/Nurse Health Advisor ?  ?I discussed the limitations, risks, security and privacy concerns of performing an evaluation and management service by telephone and the availability of in person appointments. The patient expressed understanding and agreed to proceed. ? ?Interactive audio and video telecommunications were attempted between this nurse and patient, however failed, due to patient having technical difficulties OR patient did not have access to video capability.  We continued and completed visit with audio only. ? ?Some vital signs may be absent or patient reported.  ? ?Stacie David, LPN ? ?Subjective:  ? Stacie Porter is a 76 y.o. female who presents for Medicare Annual (Subsequent) preventive examination. ? ?Review of Systems    ? ?  ? ?   ?Objective:  ?  ?There were no vitals filed for this visit. ?There is no height or weight on file to calculate BMI. ? ? ?  02/11/2021  ?  7:06 AM 11/16/2020  ?  8:51 AM 10/04/2020  ?  2:33 PM 11/13/2019  ?  1:30 PM 08/12/2019  ? 10:36 AM 11/07/2018  ? 10:31 AM 11/04/2018  ?  2:08 PM  ?Advanced Directives  ?Does Patient Have a Medical Advance Directive? Yes Yes Yes Yes Yes Yes Yes  ?Type of Corporate treasurer of [place];Living will [place];Living will [place];Living will [place];Living will [place];Living will [place];Living will  ?Does patient want to make changes to medical advance directive?   No - Patient declined    No - Patient declined  ?Copy of [place] in Chart?  Yes - validated most recent copy scanned in chart (See row information)  No - copy  requested No - copy requested No - copy requested   ? ? ?Current Medications (verified) ?Outpatient Encounter Medications as of 11/22/2021  ?Medication Sig  ? aspirin (ASPIRIN LOW DOSE) 81 MG EC tablet Take 1 tablet (81 mg total) by mouth daily. SWALLOW WHOLE.  ? Calcium Carbonate-Vitamin D 600-200 MG-UNIT CAPS Take 1 tablet by mouth daily.  ? dapagliflozin propanediol (FARXIGA) 10 MG TABS tablet Take 1 tablet (10 mg total) by mouth daily before breakfast.  ? hydrochlorothiazide (HYDRODIURIL) 25 MG tablet Take 1 tablet (25 mg total) by mouth daily. (Patient taking differently: Take 25 mg by mouth daily.)  ? lisinopril (ZESTRIL) 40 MG tablet Take 1 tablet (40 mg total) by mouth daily.  ? metoprolol tartrate (LOPRESSOR) 25 MG tablet Take 0.5 tablets (12.5 mg total) by mouth 2 (two) times daily.  ? MULTIPLE VITAMIN PO Take 1 tablet by mouth daily.  ? Omega-3 Fatty Acids (FISH OIL) 1200 MG CAPS Take 1,200 mg by mouth daily.  ? omeprazole (PRILOSEC) 20 MG capsule Take 1 capsule (20 mg total) by mouth daily.  ? simvastatin (ZOCOR) 40 MG tablet Take 1 tablet (40 mg total) by mouth at bedtime.  ? zinc gluconate 50 MG tablet Take 50 mg by mouth daily.  ? ?No facility-administered encounter medications on file as of 11/22/2021.  ? ? ?Allergies (verified) ?Multaq [dronedarone]  ? ?History: ?Past Medical History:  ?Diagnosis Date  ? Diabetes mellitus without complication ([place])   ? type 2  ?  Dysrhythmia   ? a fib  ? Hx of basal cell carcinoma ~2014 Mohs  ? Nose  ? Hyperlipidemia   ? Hypertension   ? Osteomyelitis of second toe of right foot ([place]) 11/11/2018  ? Sleep apnea   ? cpap  ? ?Past Surgical History:  ?Procedure Laterality Date  ? AMPUTATION TOE Right 11/11/2018  ? Procedure: AMPUTATION RIGHT 2ND TOE;  Surgeon: Dorna Leitz, MD;  Location: [facility];  Service: Orthopedics;  Laterality: Right;  ? APPENDECTOMY    ? CARDIAC ELECTROPHYSIOLOGY STUDY AND ABLATION    ? cather ablation  2011/2014  ? cardiac ablation for a fib  ?  COLONOSCOPY WITH PROPOFOL N/A 02/11/2021  ? Procedure: COLONOSCOPY WITH PROPOFOL;  Surgeon: Virgel Manifold, MD;  Location: [facility] ENDOSCOPY;  Service: Endoscopy;  Laterality: N/A;  ? EYE SURGERY Bilateral 05/2014  ? cataract  ? HAMMER TOE SURGERY Right   ? HAND SURGERY Bilateral   ? LAPAROSCOPIC OOPHERECTOMY    ? recession of gums sx    ? TUBAL LIGATION    ? ?Family History  ?Problem Relation Age of Onset  ? Ovarian cancer Sister   ? Cancer Sister   ?     ovarian  ? Lung cancer Sister   ? Cancer Sister   ?     lung and thyroid  ? Thyroid cancer Sister   ? Heart disease Mother   ? Heart disease Father   ? Hypertension Father   ? Diabetes Maternal Uncle   ? Diabetes Maternal Uncle   ? Breast cancer Neg Hx   ? ?Social History  ? ?Socioeconomic History  ? Marital status: Married  ?  Spouse name: Not on file  ? Number of children: 3  ? Years of education: Not on file  ? Highest education level: Some college, no degree  ?Occupational History  ? Occupation: retired  ?  Comment: part time subsitute   ?Tobacco Use  ? Smoking status: Never  ? Smokeless tobacco: Never  ?Vaping Use  ? Vaping Use: Never used  ?Substance and Sexual Activity  ? Alcohol use: No  ? Drug use: No  ? Sexual activity: Yes  ?Other Topics Concern  ? Not on file  ?Social History Narrative  ? Not on file  ? ?Social Determinants of Health  ? ?Financial Resource Strain: Not on file  ?Food Insecurity: Not on file  ?Transportation Needs: Not on file  ?Physical Activity: Not on file  ?Stress: Not on file  ?Social Connections: Not on file  ? ? ?Tobacco Counseling ?Counseling given: Not Answered ? ? ?Clinical Intake: ? ?Pre-visit preparation completed: Yes ? ?Pain : No/denies pain ? ?  ? ?Nutritional Risks: None ?Diabetes: Yes ?CBG done?: No ?Did pt. bring in CBG monitor from home?: No ? ?How often do you need to have someone help you when you read instructions, pamphlets, or other written materials from your doctor or pharmacy?: 1 -  Never ? ?Diabetic?yes ?Nutrition Risk Assessment: ? ?Has the patient had any N/V/D within the last 2 months?  No  ?Does the patient have any non-healing wounds?  No  ?Has the patient had any unintentional weight loss or weight gain?  No  ? ?Diabetes: ? ?Is the patient diabetic?  Yes  ?If diabetic, was a CBG obtained today?  No  ?Did the patient bring in their glucometer from home?  No  ?How often do you monitor your CBG's? Once per day.  ? ?Financial Strains and Diabetes Management: ? ?  Are you having any financial strains with the device, your supplies or your medication? No .  ?Does the patient want to be seen by Chronic Care Management for management of their diabetes?  No  ?Would the patient like to be referred to a Nutritionist or for Diabetic Management?  No  ? ?Diabetic Exams: ? ?Diabetic Eye Exam: Completed 03/31/21.  Pt has been advised about the importance in completing this exam.  ? ?Diabetic Foot Exam: Completed 11/17/19. Pt has been advised about the importance in completing this exam.  ? ?Interpreter Needed?: No ? ?Information entered by :: Kirke Shaggy, LPN ? ? ?Activities of Daily Living ? ?  11/18/2021  ?  1:05 PM 11/11/2021  ?  1:16 PM  ?In your present state of health, do you have any difficulty performing the following activities:  ?Hearing? 0 0  ?Vision? 0 0  ?Difficulty concentrating or making decisions? 0 0  ?Walking or climbing stairs? 0 0  ?Dressing or bathing? 0 0  ?Doing errands, shopping? 0 0  ?Preparing Food and eating ? N   ?Using the Toilet? N   ?In the past six months, have you accidently leaked urine? Y   ?Do you have problems with loss of bowel control? N   ?Managing your Medications? N   ?Managing your Finances? N   ?Housekeeping or managing your Housekeeping? N   ? ? ?Patient Care Team: ?[name], FNP as PCP - General (Family Medicine) ?Lorelee Cover., MD (Ophthalmology) ?Corey Skains, MD as Consulting Physician (Cardiology) ?Ralene Bathe, MD (Dermatology) ?Samara Deist, DPM as Referring Physician (Podiatry) ?Georgina Peer, RD as Dietitian ([place]) ? ?Indicate any recent Medical Services you may have received from other than [facility] providers in the past year (date may be approximate). ? ?   ?[place]

## 2021-11-23 ENCOUNTER — Other Ambulatory Visit: Payer: Self-pay | Admitting: Family Medicine

## 2021-11-23 DIAGNOSIS — E1159 Type 2 diabetes mellitus with other circulatory complications: Secondary | ICD-10-CM

## 2021-11-23 DIAGNOSIS — Z0000 Encounter for general adult medical examination without abnormal findings: Secondary | ICD-10-CM | POA: Insufficient documentation

## 2021-11-23 DIAGNOSIS — E1165 Type 2 diabetes mellitus with hyperglycemia: Secondary | ICD-10-CM

## 2021-11-23 DIAGNOSIS — E1169 Type 2 diabetes mellitus with other specified complication: Secondary | ICD-10-CM

## 2021-11-23 DIAGNOSIS — D473 Essential (hemorrhagic) thrombocythemia: Secondary | ICD-10-CM

## 2021-11-26 LAB — MICROALBUMIN / CREATININE URINE RATIO
Creatinine, Urine: 64 mg/dL
Microalb/Creat Ratio: <5 mg/g creat (ref 0–29)
Microalbumin, Urine: <3 ug/mL

## 2021-11-26 LAB — CBC WITH DIFFERENTIAL/PLATELET
Basophils Absolute: 0 10*3/uL (ref 0.0–0.2)
Basos: 1 %
EOS (ABSOLUTE): 0.1 10*3/uL (ref 0.0–0.4)
Eos: 2 %
Hematocrit: 39.7 % (ref 34.0–46.6)
Hemoglobin: 13.5 g/dL (ref 11.1–15.9)
Immature Grans (Abs): 0 10*3/uL (ref 0.0–0.1)
Immature Granulocytes: 0 %
Lymphocytes Absolute: 1.7 10*3/uL (ref 0.7–3.1)
Lymphs: 29 %
MCH: 32.1 pg (ref 26.6–33.0)
MCHC: 34 g/dL (ref 31.5–35.7)
MCV: 95 fL (ref 79–97)
Monocytes Absolute: 0.6 10*3/uL (ref 0.1–0.9)
Monocytes: 10 %
Neutrophils Absolute: 3.4 10*3/uL (ref 1.4–7.0)
Neutrophils: 58 %
Platelets: 102 10*3/uL — ABNORMAL LOW (ref 150–450)
RBC: 4.2 x10E6/uL (ref 3.77–5.28)
RDW: 12.8 % (ref 11.7–15.4)
WBC: 5.8 10*3/uL (ref 3.4–10.8)

## 2021-11-26 LAB — LIPID PANEL
Chol/HDL Ratio: 4 ratio (ref 0.0–4.4)
Cholesterol, Total: 157 mg/dL (ref 100–199)
HDL: 39 mg/dL — ABNORMAL LOW (ref >39)
LDL Chol Calc (NIH): 90 mg/dL (ref 0–99)
Triglycerides: 162 mg/dL — ABNORMAL HIGH (ref 0–149)
VLDL Cholesterol Cal: 28 mg/dL (ref 5–40)

## 2021-11-26 LAB — COMPREHENSIVE METABOLIC PANEL
ALT: 22 IU/L (ref 0–32)
AST: 23 IU/L (ref 0–40)
Albumin/Globulin Ratio: 2 (ref 1.2–2.2)
Albumin: 4.4 g/dL (ref 3.7–4.7)
Alkaline Phosphatase: 72 IU/L (ref 44–121)
BUN/Creatinine Ratio: 33 — ABNORMAL HIGH (ref 12–28)
BUN: 30 mg/dL — ABNORMAL HIGH (ref 8–27)
Bilirubin Total: 0.7 mg/dL (ref 0.0–1.2)
CO2: 21 mmol/L (ref 20–29)
Calcium: 9.7 mg/dL (ref 8.7–10.3)
Chloride: 104 mmol/L (ref 96–106)
Creatinine, Ser: 0.9 mg/dL (ref 0.57–1.00)
Globulin, Total: 2.2 g/dL (ref 1.5–4.5)
Glucose: 157 mg/dL — ABNORMAL HIGH (ref 70–99)
Potassium: 4.3 mmol/L (ref 3.5–5.2)
Sodium: 140 mmol/L (ref 134–144)
Total Protein: 6.6 g/dL (ref 6.0–8.5)
eGFR: 67 mL/min/{1.73_m2} (ref >59)

## 2021-11-26 LAB — T4, FREE: Free T4: 1.25 ng/dL (ref 0.82–1.77)

## 2021-11-26 LAB — HEMOGLOBIN A1C
Est. average glucose Bld gHb Est-mCnc: 171 mg/dL
Hgb A1c MFr Bld: 7.6 % — ABNORMAL HIGH (ref 4.8–5.6)

## 2021-11-26 LAB — TSH: TSH: 1.67 u[IU]/mL (ref 0.450–4.500)

## 2021-12-07 ENCOUNTER — Encounter: Payer: Medicare Other | Admitting: Family Medicine

## 2021-12-13 ENCOUNTER — Ambulatory Visit (INDEPENDENT_AMBULATORY_CARE_PROVIDER_SITE_OTHER): Payer: Medicare Other | Admitting: Family Medicine

## 2021-12-13 ENCOUNTER — Encounter: Payer: Self-pay | Admitting: Family Medicine

## 2021-12-13 VITALS — BP 131/72 | HR 85 | Temp 97.8°F | Wt 223.0 lb

## 2021-12-13 DIAGNOSIS — J019 Acute sinusitis, unspecified: Secondary | ICD-10-CM | POA: Diagnosis not present

## 2021-12-13 DIAGNOSIS — J209 Acute bronchitis, unspecified: Secondary | ICD-10-CM

## 2021-12-13 MED ORDER — AMOXICILLIN-POT CLAVULANATE 875-125 MG PO TABS
1.0000 | ORAL_TABLET | Freq: Two times a day (BID) | ORAL | 0 refills | Status: DC
Start: 2021-12-13 — End: 2022-01-04

## 2021-12-13 MED ORDER — TRELEGY ELLIPTA 100-62.5-25 MCG/ACT IN AEPB: 1.0000 | INHALATION_SPRAY | Freq: Every day | RESPIRATORY_TRACT | 0 refills | Status: DC

## 2021-12-13 MED ORDER — PREDNISONE 5 MG PO TABS: 5.0000 mg | ORAL_TABLET | Freq: Every day | ORAL | 0 refills | Status: DC

## 2021-12-13 NOTE — Assessment & Plan Note (Signed)
Printed Rx provided if symptoms do not improve within 1 week given complaints of congestion; however, non palpable sinus pain at this time ?

## 2021-12-13 NOTE — Assessment & Plan Note (Addendum)
Acute complaints of congestion >7 days, now with cough x2 days ?Husband was dx'd with PNA last week; was treated ?Denies hx of seasonal allergies ?Wheezing heard throughout lung fields; normal RR, no accessory muscle use ?OK to continue OTC treatment; however, recommend use of inhaler to assist with tightness as well as 5 day course of low dose oral steroid ?Advised that BG readings may elevate with use of steroid ?

## 2021-12-13 NOTE — Progress Notes (Signed)
?  ?I,Elena D DeSanto,acting as a scribe for [name], FNP.,have documented all relevant documentation on the behalf of [name], FNP,as directed by  [name], FNP while in the presence of [name], FNP. ? ?Established patient visit ? ? ?Patient: Stacie Porter   DOB: 03-13-1946   76 y.o. Female  MRN: [id number] ?Visit Date: 12/13/2021 ? ?Today's healthcare provider: [name], FNP  ?Re Introduced to nurse practitioner role and practice setting.  All questions answered.  Discussed provider/patient relationship and expectations. ? ?Subjective  ?  ?HPI  ?Cough ?Patient is a 76 year old female who presents today with complaint of cough and congestion for over a week.  She states that she began having cough 2 days ago.  She denies any fever.  She has not tested for Covid.  She also denies chest pain and shortness of breath but does note some wheezing.  Her cough is nonproductive, croupy sounding and worse at night.  She reports she has been using Mucinexz but does not feel it is working.  She does report her chest feeling tight. ? ? ?Medications: ?Outpatient Medications Prior to Visit  ?Medication Sig  ? aspirin (ASPIRIN LOW DOSE) 81 MG EC tablet Take 1 tablet (81 mg total) by mouth daily. SWALLOW WHOLE.  ? Calcium Carbonate-Vitamin D 600-200 MG-UNIT CAPS Take 1 tablet by mouth daily.  ? dapagliflozin propanediol (FARXIGA) 10 MG TABS tablet Take 1 tablet (10 mg total) by mouth daily before breakfast.  ? hydrochlorothiazide (HYDRODIURIL) 25 MG tablet Take 1 tablet (25 mg total) by mouth daily. (Patient taking differently: Take 25 mg by mouth daily.)  ? lisinopril (ZESTRIL) 40 MG tablet Take 1 tablet (40 mg total) by mouth daily.  ? metoprolol tartrate (LOPRESSOR) 25 MG tablet Take 0.5 tablets (12.5 mg total) by mouth 2 (two) times daily.  ? MULTIPLE VITAMIN PO Take 1 tablet by mouth daily.  ? Omega-3 Fatty Acids (FISH OIL) 1200 MG CAPS Take 1,200 mg by mouth daily.  ? omeprazole (PRILOSEC) 20 MG  capsule Take 1 capsule (20 mg total) by mouth daily.  ? simvastatin (ZOCOR) 40 MG tablet Take 1 tablet (40 mg total) by mouth at bedtime.  ? zinc gluconate 50 MG tablet Take 50 mg by mouth daily.  ? ?No facility-administered medications prior to visit.  ? ? ?Review of Systems  ?Constitutional:  Negative for fever.  ?HENT:  Positive for rhinorrhea. Negative for congestion, ear pain, postnasal drip, sinus pressure, sinus pain, sneezing, sore throat and tinnitus.   ?Gastrointestinal:  Negative for abdominal pain, constipation, diarrhea and nausea.  ?Genitourinary:  Negative for pelvic pain.  ?Musculoskeletal:  Positive for arthralgias.  ? ? ?  Objective  ?  ?BP 131/72 (BP Location: Right Arm, Patient Position: Sitting, Cuff Size: Large)   Pulse 85   Temp 97.8 ?F (36.6 ?C) (Oral)   Wt 223 lb (101.2 kg)   SpO2 95%   BMI 37.11 kg/m?  ?BP Readings from Last 3 Encounters:  ?12/13/21 131/72  ?11/11/21 (!) 148/70  ?09/20/21 140/82  ? ?Wt Readings from Last 3 Encounters:  ?12/13/21 223 lb (101.2 kg)  ?11/22/21 220 lb (99.8 kg)  ?11/11/21 220 lb (99.8 kg)  ? ?SpO2 Readings from Last 3 Encounters:  ?12/13/21 95%  ?11/11/21 96%  ?09/05/21 98%  ? ?  ? ?Physical Exam ?Vitals and nursing note reviewed.  ?Constitutional:   ?   General: She is not in acute distress. ?  Appearance: Normal appearance. She is obese. She is ill-appearing. She is not toxic-appearing or diaphoretic.  ?HENT:  ?   Head: Normocephalic and atraumatic.  ?   Right Ear: Tympanic membrane, ear canal and external ear normal. There is no impacted cerumen.  ?   Left Ear: Tympanic membrane, ear canal and external ear normal. There is no impacted cerumen.  ?   Nose: Congestion and rhinorrhea present.  ?   Mouth/Throat:  ?   Mouth: Mucous membranes are moist.  ?   Pharynx: No oropharyngeal exudate or posterior oropharyngeal erythema.  ?Cardiovascular:  ?   Rate and Rhythm: Normal rate and regular rhythm.  ?   Pulses: Normal pulses.  ?   Heart sounds: Normal heart  sounds. No murmur heard. ?  No friction rub. No gallop.  ?Pulmonary:  ?   Effort: Pulmonary effort is normal. No respiratory distress.  ?   Breath sounds: Decreased air movement present. No stridor. Examination of the right-upper field reveals wheezing. Examination of the left-upper field reveals wheezing. Examination of the right-middle field reveals wheezing. Examination of the left-middle field reveals wheezing. Examination of the right-lower field reveals decreased breath sounds and wheezing. Examination of the left-lower field reveals decreased breath sounds and wheezing. Decreased breath sounds and wheezing present. No rhonchi or rales.  ?Chest:  ?   Chest wall: No tenderness.  ?Abdominal:  ?   General: Bowel sounds are normal.  ?   Palpations: Abdomen is soft.  ?Musculoskeletal:     ?   General: No swelling, tenderness, deformity or signs of injury. Normal range of motion.  ?   Cervical back: Normal range of motion and neck supple. No tenderness.  ?   Right lower leg: No edema.  ?   Left lower leg: No edema.  ?Lymphadenopathy:  ?   Cervical: No cervical adenopathy.  ?Skin: ?   General: Skin is warm and dry.  ?   Capillary Refill: Capillary refill takes less than 2 seconds.  ?   Coloration: Skin is not jaundiced or pale.  ?   Findings: No bruising, erythema, lesion or rash.  ?Neurological:  ?   General: No focal deficit present.  ?   Mental Status: She is alert and oriented to person, place, and time. Mental status is at baseline.  ?   Cranial Nerves: No cranial nerve deficit.  ?   Sensory: No sensory deficit.  ?   Motor: No weakness.  ?   Coordination: Coordination normal.  ?Psychiatric:     ?   Mood and Affect: Mood normal.     ?   Behavior: Behavior normal.     ?   Thought Content: Thought content normal.     ?   Judgment: Judgment normal.  ?  ? ?No results found for any visits on 12/13/21. ? Assessment & Plan  ?  ? ?Problem List Items Addressed This Visit   ? ?  ? Respiratory  ? Acute bronchitis with  wheezing - Primary  ?  Acute complaints of congestion >7 days, now with cough x2 days ?Husband was dx'd with PNA last week; was treated ?Denies hx of seasonal allergies ?Wheezing heard throughout lung fields; normal RR, no accessory muscle use ?OK to continue OTC treatment; however, recommend use of inhaler to assist with tightness as well as 5 day course of low dose oral steroid ?Advised that BG readings may elevate with use of steroid ? ?  ?  ? Relevant Medications  ?  predniSONE (DELTASONE) 5 MG tablet  ? Fluticasone-Umeclidin-Vilant (TRELEGY ELLIPTA) 100-62.5-25 MCG/ACT AEPB  ? Acute non-recurrent sinusitis  ?  Printed Rx provided if symptoms do not improve within 1 week given complaints of congestion; however, non palpable sinus pain at this time ? ?  ?  ? Relevant Medications  ? predniSONE (DELTASONE) 5 MG tablet  ? amoxicillin-clavulanate (AUGMENTIN) 875-125 MG tablet  ? ? ? ?Return in about 2 weeks (around 12/27/2021), or if symptoms worsen or fail to improve.  ?   ?I, [name], FNP, have reviewed all documentation for this visit. The documentation on 12/13/21 for the exam, diagnosis, procedures, and orders are all accurate and complete. ? ?[name], FNP  ?[place] ?[phone] (phone) ?[phone] (fax) ? ?[facility] ?

## 2021-12-13 NOTE — Patient Instructions (Signed)
I recommend the following OTC, over the counter, medications to assist with recovery: ? ?-Use of a nasal spray- Ocean nasal spray or similar, to ensure nasal passageways are moist and not dry ? ?-Use of a intranasal steroid- Flonase, or similar, assist with decrease inflammation, may not notice effect for 4 days or longer, can use daily or twice a day. Best to blow nose prior to use.  ? ?-Use of cough syrup- Delym or Robitussin DM- to assist with cough, follow directions on package. ? ?-Use of expectorant- Mucinex DM- ensure 32-64 oz of water to activate if tablets ? ?Use of throat lozenges, sugar free candies, water to assist with keeping throat moist. ? ?

## 2021-12-14 ENCOUNTER — Encounter: Payer: Medicare Other | Admitting: Family Medicine

## 2021-12-18 ENCOUNTER — Encounter: Payer: Self-pay | Admitting: Family Medicine

## 2021-12-20 ENCOUNTER — Other Ambulatory Visit: Payer: Self-pay | Admitting: Family Medicine

## 2021-12-28 ENCOUNTER — Encounter: Payer: Self-pay | Admitting: Family Medicine

## 2021-12-28 ENCOUNTER — Other Ambulatory Visit: Payer: Self-pay | Admitting: Family Medicine

## 2021-12-28 DIAGNOSIS — B3731 Acute candidiasis of vulva and vagina: Secondary | ICD-10-CM

## 2021-12-28 MED ORDER — FLUCONAZOLE 150 MG PO TABS: ORAL_TABLET | ORAL | 0 refills | Status: DC

## 2022-01-03 NOTE — Progress Notes (Signed)
?  ? ? ?I,Roshena L Chambers,acting as a scribe for [name], FNP.,have documented all relevant documentation on the behalf of [name], FNP,as directed by  [name], FNP while in the presence of [name], FNP.  ? ?Established patient visit ? ? ?Patient: Stacie Porter   DOB: 02/22/1946   76 y.o. Female  MRN: [id number] ?Visit Date: 01/04/2022 ? ?Today's healthcare provider: [name], FNP  ?Re Introduced to nurse practitioner role and practice setting.  All questions answered.  Discussed provider/patient relationship and expectations. ? ? ?Chief Complaint  ?Patient presents with  ? Diabetes  ? Hypertension  ? Hyperlipidemia  ? ?Subjective  ?  ?HPI Patient had AWV with [facility] 11/22/21 ?Diabetes Mellitus Type II, follow-up ? ?Lab Results  ?Component Value Date  ? HGBA1C 7.6 (H) 11/25/2021  ? HGBA1C 6.9 (A) 09/05/2021  ? HGBA1C 7.2 (A) 05/25/2021  ? ?Last seen for diabetes 1 months ago.  ?Management since then includes Reassurance provided on 14/28 day data review, consistent with previous A1c levels. Encourage trial/error of adding more protein ex. More yogurt, eggs, etc or taking out apples. ?RTC in 1 month for scheduled a1c ?Continue to recommend balanced, lower carb meals. Smaller meal size, adding snacks. Choosing water as drink of choice and increasing purposeful exercise ?discuss additional medication use at next appt. ? ?She reports good compliance with treatment. ?She is not having side effects.  ? ?Home blood sugar records: fasting range: 170's ? ?Episodes of hypoglycemia? No  ?  ?Current insulin regiment: none ?Most Recent Eye Exam: 03/31/2021 ? ?--------------------------------------------------------------------------------------------------- ?Hypertension, follow-up ? ?BP Readings from Last 3 Encounters:  ?01/04/22 135/63  ?12/13/21 131/72  ?11/11/21 (!) 148/70  ? Wt Readings from Last 3 Encounters:  ?01/04/22 221 lb (100.2 kg)  ?12/13/21 223 lb (101.2 kg)  ?11/22/21 220 lb (99.8 kg)  ?   ? ?She was last seen for hypertension 4 months ago.  ?BP at that visit was 140/82. ?Management since that visit includes continue current medication. ?She reports good compliance with treatment. ?She is not having side effects.  ?She is exercising. ?She is not adherent to low salt diet.   ?Outside blood pressures are checked occasionally. ? ?She does not smoke. ? ?--------------------------------------------------------------------------------------------------- ?Lipid/Cholesterol, follow-up ? ?Last Lipid Panel: ?Lab Results  ?Component Value Date  ? CHOL 157 11/25/2021  ? [place] 90 11/25/2021  ? HDL 39 (L) 11/25/2021  ? TRIG 162 (H) 11/25/2021  ? ? ?She was last seen for this 4 months ago.  ?Management since that visit includes Continue low fat diet and exercise as tolerated.Continue Simvastatin 40 mg. Labs done on 11/25/2021 provider recommended increase strength of statin medication and add Omega 3 supplement to assist HDL ? ?She reports good compliance with treatment. ?She is not having side effects.  ? ? ?She is following a Regular diet. ?Current exercise: walking ? ?Last metabolic panel ?Lab Results  ?Component Value Date  ? GLUCOSE 157 (H) 11/25/2021  ? NA 140 11/25/2021  ? K 4.3 11/25/2021  ? BUN 30 (H) 11/25/2021  ? CREATININE 0.90 11/25/2021  ? EGFR 67 11/25/2021  ? GFRNONAA 71 11/04/2019  ? CALCIUM 9.7 11/25/2021  ? AST 23 11/25/2021  ? ALT 22 11/25/2021  ? ?The 10-year ASCVD risk score (Arnett DK, et al., 2019) is: 39.1% ? ?---------------------------------------------------------------------------------------------------  ? ? ?Medications: ?Outpatient Medications Prior to Visit  ?Medication Sig  ? aspirin (ASPIRIN LOW DOSE) 81 MG EC tablet Take 1 tablet (81 mg  total) by mouth daily. SWALLOW WHOLE.  ? Calcium Carbonate-Vitamin D 600-200 MG-UNIT CAPS Take 1 tablet by mouth daily.  ? dapagliflozin propanediol (FARXIGA) 10 MG TABS tablet Take 1 tablet (10 mg total) by mouth daily before breakfast.  ?  fluconazole (DIFLUCAN) 150 MG tablet Take one tablet, by mouth; repeat after 4 days if symptoms remain.  ? Fluticasone-Umeclidin-Vilant (TRELEGY ELLIPTA) 100-62.5-25 MCG/ACT AEPB Inhale 1 puff into the lungs daily.  ? hydrochlorothiazide (HYDRODIURIL) 25 MG tablet Take 1 tablet (25 mg total) by mouth daily. (Patient taking differently: Take 25 mg by mouth daily.)  ? lisinopril (ZESTRIL) 40 MG tablet Take 1 tablet (40 mg total) by mouth daily.  ? metoprolol tartrate (LOPRESSOR) 25 MG tablet Take 0.5 tablets (12.5 mg total) by mouth 2 (two) times daily.  ? MULTIPLE VITAMIN PO Take 1 tablet by mouth daily.  ? Omega-3 Fatty Acids (FISH OIL) 1200 MG CAPS Take 1,200 mg by mouth daily.  ? omeprazole (PRILOSEC) 20 MG capsule Take 1 capsule (20 mg total) by mouth daily.  ? simvastatin (ZOCOR) 40 MG tablet Take 1 tablet (40 mg total) by mouth at bedtime.  ? zinc gluconate 50 MG tablet Take 50 mg by mouth daily.  ? [DISCONTINUED] amoxicillin-clavulanate (AUGMENTIN) 875-125 MG tablet Take 1 tablet by mouth 2 (two) times daily.  ? [DISCONTINUED] predniSONE (DELTASONE) 5 MG tablet Take 1 tablet (5 mg total) by mouth daily with breakfast.  ? ?No facility-administered medications prior to visit.  ? ? ?Review of Systems  ?Constitutional:  Negative for chills, fatigue and fever.  ?HENT:  Negative for congestion, ear pain, rhinorrhea, sneezing and sore throat.   ?Eyes: Negative.  Negative for pain and redness.  ?Respiratory:  Negative for cough, shortness of breath and wheezing.   ?Cardiovascular:  Negative for chest pain and leg swelling.  ?Gastrointestinal:  Negative for abdominal pain, blood in stool, constipation, diarrhea and nausea.  ?Endocrine: Negative for polydipsia and polyphagia.  ?Genitourinary:  Positive for enuresis. Negative for dysuria, flank pain, hematuria, pelvic pain, vaginal bleeding and vaginal discharge.  ?Musculoskeletal:  Negative for arthralgias, back pain, gait problem and joint swelling.  ?Skin:  Negative  for rash.  ?Neurological: Negative.  Negative for dizziness, tremors, seizures, weakness, light-headedness, numbness and headaches.  ?Hematological:  Negative for adenopathy.  ?Psychiatric/Behavioral: Negative.  Negative for behavioral problems, confusion and dysphoric mood. The patient is not nervous/anxious and is not hyperactive.   ? ? ?  Objective  ?  ?BP 135/63   Pulse (!) 56   Temp 98 ?F (36.7 ?C) (Oral)   Resp 14   Wt 221 lb (100.2 kg)   SpO2 98%   BMI 36.78 kg/m?  ? ? ?Physical Exam ?Vitals and nursing note reviewed.  ?Constitutional:   ?   General: She is not in acute distress. ?   Appearance: Normal appearance. She is obese. She is not ill-appearing, toxic-appearing or diaphoretic.  ?HENT:  ?   Head: Normocephalic and atraumatic.  ?Cardiovascular:  ?   Rate and Rhythm: Regular rhythm. Bradycardia present.  ?   Pulses: Normal pulses.  ?   Heart sounds: Normal heart sounds. No murmur heard. ?  No friction rub. No gallop.  ?Pulmonary:  ?   Effort: Pulmonary effort is normal. No respiratory distress.  ?   Breath sounds: Normal breath sounds. No stridor. No wheezing, rhonchi or rales.  ?Chest:  ?   Chest wall: No tenderness.  ?Abdominal:  ?   General: Bowel sounds are normal.  ?  Palpations: Abdomen is soft.  ?Musculoskeletal:     ?   General: No swelling, tenderness, deformity or signs of injury. Normal range of motion.  ?   Right lower leg: No edema.  ?   Left lower leg: No edema.  ?Skin: ?   General: Skin is warm and dry.  ?   Capillary Refill: Capillary refill takes less than 2 seconds.  ?   Coloration: Skin is not jaundiced or pale.  ?   Findings: No bruising, erythema, lesion or rash.  ?Neurological:  ?   General: No focal deficit present.  ?   Mental Status: She is alert and oriented to person, place, and time. Mental status is at baseline.  ?   Cranial Nerves: No cranial nerve deficit.  ?   Sensory: No sensory deficit.  ?   Motor: No weakness.  ?   Coordination: Coordination normal.   ?Psychiatric:     ?   Mood and Affect: Mood normal.     ?   Behavior: Behavior normal.     ?   Thought Content: Thought content normal.     ?   Judgment: Judgment normal.  ?  ? ?No results found for any visits on 01/04/22.

## 2022-01-04 ENCOUNTER — Encounter: Payer: Self-pay | Admitting: Family Medicine

## 2022-01-04 ENCOUNTER — Ambulatory Visit (INDEPENDENT_AMBULATORY_CARE_PROVIDER_SITE_OTHER): Payer: Medicare Other | Admitting: Family Medicine

## 2022-01-04 DIAGNOSIS — E1159 Type 2 diabetes mellitus with other circulatory complications: Secondary | ICD-10-CM | POA: Diagnosis not present

## 2022-01-04 DIAGNOSIS — E1165 Type 2 diabetes mellitus with hyperglycemia: Secondary | ICD-10-CM

## 2022-01-04 DIAGNOSIS — E785 Hyperlipidemia, unspecified: Secondary | ICD-10-CM | POA: Diagnosis not present

## 2022-01-04 DIAGNOSIS — E1169 Type 2 diabetes mellitus with other specified complication: Secondary | ICD-10-CM | POA: Diagnosis not present

## 2022-01-04 DIAGNOSIS — I152 Hypertension secondary to endocrine disorders: Secondary | ICD-10-CM

## 2022-01-04 DIAGNOSIS — Z0000 Encounter for general adult medical examination without abnormal findings: Secondary | ICD-10-CM | POA: Insufficient documentation

## 2022-01-04 NOTE — Assessment & Plan Note (Addendum)
Due for shingles and COVID if desired ?Aged out of PAP ?Mammo completed earlier this year ?Colonoscopy completed last year ? ?Things to do to keep yourself healthy  ?- Exercise at least 30-45 minutes a day, 3-4 days a week.  ?- Eat a low-fat diet with lots of fruits and vegetables, up to 7-9 servings per day.  ?- Seatbelts can save your life. Wear them always.  ?- Smoke detectors on every level of your home, check batteries every year.  ?- Eye Doctor - have an eye exam every 1-2 years  ?- Safe sex - if you may be exposed to STDs, use a condom.  ?- Alcohol -  If you drink, do it moderately, less than 2 drinks per day.  ?- Rosa. Choose someone to speak for you if you are not able.  ?- Depression is common in our stressful world.If you're feeling down or losing interest in things you normally enjoy, please come in for a visit.  ?- Violence - If anyone is threatening or hurting you, please call immediately. ? ? ?

## 2022-01-04 NOTE — Assessment & Plan Note (Signed)
Chronic, borderline today; pt reports lower home readings  ?Denies CP ?Denies SOB/ DOE ?Denies low blood pressure/hypotension ?Denies vision changes ?No LE Edema noted on exam ?Continue medication, HCTZ 25, Lisinopril 40 mg, Metop BID 12.5 mg ?Denies side effects ?F/B Cards ?Seek emergent care if you develop chest pain or chest pressure ? ?

## 2022-01-04 NOTE — Assessment & Plan Note (Signed)
Chronic, stable ?Associated with HTN, HLD, DM ?Discussed importance of healthy weight management ?Discussed diet and exercise ?Body mass index is 36.78 kg/m?Marland Kitchen ? ?

## 2022-01-04 NOTE — Assessment & Plan Note (Signed)
Chronic, unstable ?Last A1c up; not due for repeat today ?A1c continues to be <8% ?Continue to recommend balanced, lower carb meals. Smaller meal size, adding snacks. Choosing water as drink of choice and increasing purposeful exercise. ?Has CGM- reports FBG of 119-180s ?Working on protein intake; and increased activity ?

## 2022-01-04 NOTE — Assessment & Plan Note (Signed)
Chronic, stable ?Denies medication concerns ?We recommend diet low in saturated fat and regular exercise - 30 min at least 5 times per week ?ASCVD risk elevated; on Zocor at 40 mg ?The 10-year ASCVD risk score (Arnett DK, et al., 2019) is: 39.1% ?  Values used to calculate the score: ?    Age: 76 years ?    Sex: Female ?    Is Non-Hispanic African American: No ?    Diabetic: Yes ?    Tobacco smoker: No ?    Systolic Blood Pressure: 742 mmHg ?    Is BP treated: Yes ?    HDL Cholesterol: 39 mg/dL ?    Total Cholesterol: 157 mg/dL ? ?

## 2022-02-10 ENCOUNTER — Ambulatory Visit: Payer: Self-pay

## 2022-02-10 NOTE — Progress Notes (Unsigned)
Established patient visit   Patient: Stacie Porter   DOB: 1946/03/06   76 y.o. Female  MRN: [id number] Visit Date: 02/15/2022  Today's healthcare provider: [name], FNP  Introduced to nurse practitioner role and practice setting.  All questions answered.  Discussed provider/patient relationship and expectations.   I,Trea Latner J Trentin Knappenberger,acting as a scribe for [name], FNP.,have documented all relevant documentation on the behalf of [name], FNP,as directed by  [name], FNP while in the presence of [name], FNP.   Chief Complaint  Patient presents with   Diabetes   Subjective    HPI  Diabetes Mellitus Type II, Follow-up  Lab Results  Component Value Date   HGBA1C 7.6 (H) 11/25/2021   HGBA1C 6.9 (A) 09/05/2021   HGBA1C 7.2 (A) 05/25/2021   Wt Readings from Last 3 Encounters:  02/15/22 222 lb 3.2 oz (100.8 kg)  01/04/22 221 lb (100.2 kg)  12/13/21 223 lb (101.2 kg)   Last seen for diabetes 3 months ago.  Management since then includes continue medication. She reports excellent compliance with treatment. She is having side effects. Excessive burping and gas. Symptoms: No fatigue No foot ulcerations  No appetite changes No nausea  Yes paresthesia of the feet  No polydipsia  No polyuria No visual disturbances   No vomiting     Home blood sugar records:  fasting 128, after breakfast 243  Episodes of hypoglycemia? No    Most Recent Eye Exam: 08/2021 Current exercise: walking Current diet habits: well balanced  Pertinent Labs: Lab Results  Component Value Date   CHOL 157 11/25/2021   HDL 39 (L) 11/25/2021   LDLCALC 90 11/25/2021   TRIG 162 (H) 11/25/2021   CHOLHDL 4.0 11/25/2021   Lab Results  Component Value Date   NA 140 11/25/2021   K 4.3 11/25/2021   CREATININE 0.90 11/25/2021   EGFR 67 11/25/2021   MICROALBUR negative 07/10/2019   LABMICR <3.0 11/25/2021      ---------------------------------------------------------------------------------------------------   Medications: Outpatient Medications Prior to Visit  Medication Sig   aspirin (ASPIRIN LOW DOSE) 81 MG EC tablet Take 1 tablet (81 mg total) by mouth daily. SWALLOW WHOLE.   Calcium Carbonate-Vitamin D 600-200 MG-UNIT CAPS Take 1 tablet by mouth daily.   dapagliflozin propanediol (FARXIGA) 10 MG TABS tablet Take 1 tablet (10 mg total) by mouth daily before breakfast.   hydrochlorothiazide (HYDRODIURIL) 25 MG tablet Take 1 tablet (25 mg total) by mouth daily. (Patient taking differently: Take 25 mg by mouth daily.)   lisinopril (ZESTRIL) 40 MG tablet Take 1 tablet (40 mg total) by mouth daily.   metoprolol tartrate (LOPRESSOR) 25 MG tablet Take 0.5 tablets (12.5 mg total) by mouth 2 (two) times daily.   MULTIPLE VITAMIN PO Take 1 tablet by mouth daily.   Omega-3 Fatty Acids (FISH OIL) 1200 MG CAPS Take 1,200 mg by mouth daily.   omeprazole (PRILOSEC) 20 MG capsule Take 1 capsule (20 mg total) by mouth daily.   simvastatin (ZOCOR) 40 MG tablet Take 1 tablet (40 mg total) by mouth at bedtime.   zinc gluconate 50 MG tablet Take 50 mg by mouth daily.   [DISCONTINUED] fluconazole (DIFLUCAN) 150 MG tablet Take one tablet, by mouth; repeat after 4 days if symptoms remain.   [DISCONTINUED] Fluticasone-Umeclidin-Vilant (TRELEGY ELLIPTA) 100-62.5-25 MCG/ACT AEPB Inhale 1 puff into the lungs daily.   No facility-administered medications prior to visit.    Review of Systems  Objective    BP (!) 136/59 (BP Location: Right Arm, Patient Position: Sitting, Cuff Size: Normal)   Pulse (!) 57   Temp 97.8 F (36.6 C) (Oral)   Resp 16   Ht 5' 5"  (1.651 m)   Wt 222 lb 3.2 oz (100.8 kg)   SpO2 97%   BMI 36.98 kg/m    Physical Exam Vitals and nursing note reviewed.  Constitutional:      General: She is not in acute distress.    Appearance: Normal appearance. She is obese. She is not  ill-appearing, toxic-appearing or diaphoretic.  HENT:     Head: Normocephalic and atraumatic.  Cardiovascular:     Rate and Rhythm: Normal rate and regular rhythm.     Pulses: Normal pulses.          Dorsalis pedis pulses are 2+ on the right side and 2+ on the left side.       Posterior tibial pulses are 2+ on the right side and 2+ on the left side.     Heart sounds: Normal heart sounds. No murmur heard.    No friction rub. No gallop.  Pulmonary:     Effort: Pulmonary effort is normal. No respiratory distress.     Breath sounds: Normal breath sounds. No stridor. No wheezing, rhonchi or rales.  Chest:     Chest wall: No tenderness.  Musculoskeletal:        General: No swelling, tenderness or signs of injury. Normal range of motion.     Right lower leg: No edema.     Left lower leg: No edema.     Right foot: Deformity and bunion present.       Feet:  Feet:     Right foot:     Protective Sensation: 10 sites tested.  3 sites sensed.     Skin integrity: Skin integrity normal.     Toenail Condition: Right toenails are normal.     Left foot:     Protective Sensation: 10 sites tested.  3 sites sensed.     Skin integrity: Skin integrity normal.     Toenail Condition: Left toenails are normal.  Skin:    General: Skin is warm and dry.     Capillary Refill: Capillary refill takes less than 2 seconds.     Coloration: Skin is not jaundiced or pale.     Findings: No bruising, erythema, lesion or rash.  Neurological:     General: No focal deficit present.     Mental Status: She is alert and oriented to person, place, and time. Mental status is at baseline.     Cranial Nerves: No cranial nerve deficit.     Sensory: No sensory deficit.     Motor: No weakness.     Coordination: Coordination normal.  Psychiatric:        Mood and Affect: Mood normal.        Behavior: Behavior normal.        Thought Content: Thought content normal.        Judgment: Judgment normal.      No results found  for any visits on 02/15/22.  Assessment & Plan     Problem List Items Addressed This Visit       Endocrine   Type 2 diabetes mellitus with hyperglycemia, without long-term current use of insulin (HCC) - Primary    Chronic, previously stable with diet/exercise, 10 mg Farxiga Wishes to add XR Metformin 500 mg QD Recommend addition of  additional protein into meals Report of FBG of 128 today, and after toast and fruit BG rose to >200.  Review of CGM shows 7 day average 170s, consistent for 14 day and 30 day and 90 day. Continue to recommend balanced, lower carb meals. Smaller meal size, adding snacks. Choosing water as drink of choice and increasing purposeful exercise. Is utilizing [employer] program with insurance.  Due for A1c next week DM foot exam updated today      Relevant Medications   metFORMIN (GLUCOPHAGE-XR) 500 MG 24 hr tablet     Return in about 1 week (around 02/22/2022) for nurse follow up.      Vonna Kotyk, FNP, have reviewed all documentation for this visit. The documentation on 02/15/22 for the exam, diagnosis, procedures, and orders are all accurate and complete.    [name], [place] [phone] (phone) [phone] (fax)  [place]

## 2022-02-10 NOTE — Telephone Encounter (Signed)
  Chief Complaint: High Blood sugar readings. Symptoms: None.  Frequency: ongoing Pertinent Negatives: Patient denies Confusion  Disposition: '[]'$ ED /'[]'$ Urgent Care (no appt availability in office) / '[]'$ Appointment(In office/virtual)/ '[]'$  [facility]/ '[]'$ Home Care/ '[]'$ Refused Recommended Disposition /'[]'$ [facility]/ '[x]'$  Follow-up with PCP Additional Notes: PT also mentions increased burping. PT has OV scheduled for 02/15/2022. Pt has been watching Blood sugars and number are consistently in the 180 - 200 range. Pt is exercising and eating well. PT thinks that faxiga is not working.     Summary: bs 180-200 pt concerned.   Pt stated that her BS hasn't been doing well .Staying around 180-200. Pt mentioned that she was concerned and requested and earlier appointment with PCP only.   Scheduled pt for 02/15/2022.   Please advise.      Reason for Disposition  Blood glucose 70-240 mg/dL (3.9 -13.3 mmol/L)  Answer Assessment - Initial Assessment Questions 1. BLOOD GLUCOSE: "What is your blood glucose level?"      180-200 2. ONSET: "When did you check the blood glucose?"     High in the morning 3. USUAL RANGE: "What is your glucose level usually?" (e.g., usual fasting morning value, usual evening value)     Last night 202, 7:36 am 172, 10:32 am 188. 4. KETONES: "Do you check for ketones (urine or blood test strips)?" If yes, ask: "What does the test show now?"      no 5. TYPE 1 or 2:  "Do you know what type of diabetes you have?"  (e.g., Type 1, Type 2, Gestational; doesn't know)      Type 2 6. INSULIN: "Do you take insulin?" "What type of insulin(s) do you use? What is the mode of delivery? (syringe, pen; injection or pump)?"      No 7. DIABETES PILLS: "Do you take any pills for your diabetes?" If yes, ask: "Have you missed taking any pills recently?"     Yes  - none missed 8. OTHER SYMPTOMS: "Do you have any symptoms?" (e.g., fever, frequent urination, difficulty  breathing, dizziness, weakness, vomiting)     no 9. PREGNANCY: "Is there any chance you are pregnant?" "When was your last menstrual period?"     na  Protocols used: Diabetes - High Blood Sugar-A-AH

## 2022-02-10 NOTE — Telephone Encounter (Signed)
Pt called in, she was given advice from [facility] and said her BS never gotten that high or low but thinks she needs a medication change. Explained that she has appt on 02/15/22 at 1000 with Daneil Dan and if needed anything before then to just call office back. Pt verbalized understanding.

## 2022-02-10 NOTE — Telephone Encounter (Signed)
Called patient, LMTCB. Okay for PEC to advise.

## 2022-02-15 ENCOUNTER — Ambulatory Visit (INDEPENDENT_AMBULATORY_CARE_PROVIDER_SITE_OTHER): Payer: Medicare Other | Admitting: Family Medicine

## 2022-02-15 ENCOUNTER — Encounter: Payer: Self-pay | Admitting: Family Medicine

## 2022-02-15 VITALS — BP 136/59 | HR 57 | Temp 97.8°F | Resp 16 | Ht 65.0 in | Wt 222.2 lb

## 2022-02-15 DIAGNOSIS — E1165 Type 2 diabetes mellitus with hyperglycemia: Secondary | ICD-10-CM

## 2022-02-15 MED ORDER — METFORMIN HCL ER 500 MG PO TB24: 500.0000 mg | ORAL_TABLET | Freq: Every day | ORAL | 1 refills | Status: DC

## 2022-02-15 NOTE — Assessment & Plan Note (Signed)
Chronic, previously stable with diet/exercise, 10 mg Farxiga Wishes to add XR Metformin 500 mg QD Recommend addition of additional protein into meals Report of FBG of 128 today, and after toast and fruit BG rose to >200.  Review of CGM shows 7 day average 170s, consistent for 14 day and 30 day and 90 day. Continue to recommend balanced, lower carb meals. Smaller meal size, adding snacks. Choosing water as drink of choice and increasing purposeful exercise. Is utilizing [employer] program with insurance.  Due for A1c next week DM foot exam updated today

## 2022-02-22 ENCOUNTER — Encounter: Payer: Federal, State, Local not specified - PPO | Admitting: Dermatology

## 2022-02-23 ENCOUNTER — Ambulatory Visit (INDEPENDENT_AMBULATORY_CARE_PROVIDER_SITE_OTHER): Payer: Medicare Other | Admitting: Family Medicine

## 2022-02-23 ENCOUNTER — Ambulatory Visit: Payer: Medicare Other | Admitting: Family Medicine

## 2022-02-23 DIAGNOSIS — E1165 Type 2 diabetes mellitus with hyperglycemia: Secondary | ICD-10-CM | POA: Diagnosis not present

## 2022-02-23 LAB — POCT GLYCOSYLATED HEMOGLOBIN (HGB A1C)
Est. average glucose Bld gHb Est-mCnc: 166
Hemoglobin A1C: 7.4 % — AB (ref 4.0–5.6)

## 2022-02-23 NOTE — Progress Notes (Signed)
Patient is here for a nurse visit to check A1C.   Diabetes Mellitus Type II  Lab Results  Component Value Date   HGBA1C 7.4 (A) 02/23/2022   HGBA1C 7.6 (H) 11/25/2021   HGBA1C 6.9 (A) 09/05/2021   Wt Readings from Last 3 Encounters:  02/15/22 222 lb 3.2 oz (100.8 kg)  01/04/22 221 lb (100.2 kg)  12/13/21 223 lb (101.2 kg)    ---------------------------------------------------------------------------------------------------

## 2022-03-28 ENCOUNTER — Encounter: Payer: Self-pay | Admitting: Family Medicine

## 2022-03-29 ENCOUNTER — Telehealth: Payer: Self-pay

## 2022-03-29 NOTE — Telephone Encounter (Signed)
PA for Farxiga '10mg'$  started today for continuation through Cover my meds. Waiting on determination.

## 2022-04-06 ENCOUNTER — Other Ambulatory Visit: Payer: Self-pay | Admitting: Family Medicine

## 2022-04-06 DIAGNOSIS — E1165 Type 2 diabetes mellitus with hyperglycemia: Secondary | ICD-10-CM

## 2022-04-06 MED ORDER — FREESTYLE LIBRE 2 SENSOR MISC: 1.0000 | 11 refills | Status: DC

## 2022-04-10 ENCOUNTER — Ambulatory Visit: Payer: Self-pay | Admitting: *Deleted

## 2022-04-10 NOTE — Patient Outreach (Signed)
  Care Coordination   04/10/2022 Name: Stacie Porter MRN: [id number] DOB: 1945-12-08   Care Coordination Outreach Attempts:  An unsuccessful telephone outreach was attempted today to offer the patient information about available care coordination services as a benefit of their health plan.   Follow Up Plan:  Additional outreach attempts will be made to offer the patient care coordination information and services.   Encounter Outcome:  No Answer  Care Coordination Interventions Activated:  No   Care Coordination Interventions:  No, not indicated    Anaya Bovee, [place] Worker  [facility] Care Management [phone]

## 2022-04-11 NOTE — Patient Instructions (Signed)
Visit Information  Thank you for taking time to visit with me today. Please don't hesitate to contact me if I can be of assistance to you.   Following are the goals we discussed today:   Goals Addressed             This Visit's Progress    care management activities       Care Coordination Interventions:   Case Management program offered/dicussed Confirmed PCP appointment scheduled for 05/29/22 Confirmed annual wellness visit scheduled for 11/27/22 Patient denied having ay nursing or community resource needs        Please call the care guide team at [phone] if you need to cancel or reschedule your appointment.   If you are experiencing a Mental Health or [place] or need someone to talk to, please call the Suicide and Crisis Lifeline: 988   Patient verbalizes understanding of instructions and care plan provided today and agrees to view in [facility]. Active MyChart status and patient understanding of how to access instructions and care plan via MyChart confirmed with patient.     Next PCP appointment scheduled for: 05/29/22 Next AWV (Annual Wellness Visit) scheduled for: 11/27/22  Sheralyn Boatman [facility] Care Management [phone]

## 2022-04-11 NOTE — Patient Outreach (Signed)
  Care Coordination   Initial Visit Note   04/11/2022 Name: LEATHA ROHNER MRN: [id number] DOB: [birth year]  AMBRIELLA KITT is a 76 y.o. year old female who sees [name], FNP for primary care. I spoke with  [name] by phone today  What matters to the patients health and wellness today?  Patient declined further case management follow up    Goals Addressed             This Visit's Progress    care management activities       Care Coordination Interventions:   Case Management program offered/dicussed Confirmed PCP appointment scheduled for 05/29/22 Confirmed annual wellness visit scheduled for 11/27/22 Patient denied having ay nursing or community resource needs        SDOH assessments and interventions completed:  No     Care Coordination Interventions Activated:  No  Care Coordination Interventions:  No, not indicated   Follow up plan: No further intervention required.   Encounter Outcome:  Pt. Visit Completed

## 2022-04-18 ENCOUNTER — Encounter: Payer: Self-pay | Admitting: Family Medicine

## 2022-04-27 ENCOUNTER — Ambulatory Visit (INDEPENDENT_AMBULATORY_CARE_PROVIDER_SITE_OTHER): Payer: Medicare Other | Admitting: Dermatology

## 2022-04-27 DIAGNOSIS — Z85828 Personal history of other malignant neoplasm of skin: Secondary | ICD-10-CM

## 2022-04-27 DIAGNOSIS — L720 Epidermal cyst: Secondary | ICD-10-CM

## 2022-04-27 DIAGNOSIS — L578 Other skin changes due to chronic exposure to nonionizing radiation: Secondary | ICD-10-CM

## 2022-04-27 DIAGNOSIS — D1721 Benign lipomatous neoplasm of skin and subcutaneous tissue of right arm: Secondary | ICD-10-CM

## 2022-04-27 DIAGNOSIS — L820 Inflamed seborrheic keratosis: Secondary | ICD-10-CM | POA: Diagnosis not present

## 2022-04-27 DIAGNOSIS — L814 Other melanin hyperpigmentation: Secondary | ICD-10-CM

## 2022-04-27 DIAGNOSIS — I781 Nevus, non-neoplastic: Secondary | ICD-10-CM

## 2022-04-27 DIAGNOSIS — D229 Melanocytic nevi, unspecified: Secondary | ICD-10-CM

## 2022-04-27 DIAGNOSIS — Z1283 Encounter for screening for malignant neoplasm of skin: Secondary | ICD-10-CM | POA: Diagnosis not present

## 2022-04-27 NOTE — Patient Instructions (Addendum)
Cryotherapy Aftercare  Wash gently with soap and water everyday.   Apply Vaseline and Band-Aid daily until healed.     Due to recent changes in healthcare laws, you may see results of your pathology and/or laboratory studies on MyChart before the doctors have had a chance to review them. We understand that in some cases there may be results that are confusing or concerning to you. Please understand that not all results are received at the same time and often the doctors may need to interpret multiple results in order to provide you with the best plan of care or course of treatment. Therefore, we ask that you please give us 2 business days to thoroughly review all your results before contacting the office for clarification. Should we see a critical lab result, you will be contacted sooner.   If You Need Anything After Your Visit  If you have any questions or concerns for your doctor, please call our main line at [phone] and press option 4 to reach your doctor's medical assistant. If no one answers, please leave a voicemail as directed and we will return your call as soon as possible. Messages left after 4 pm will be answered the following business day.   You may also send us a message via MyChart. We typically respond to MyChart messages within 1-2 business days.  For prescription refills, please ask your pharmacy to contact our office. Our fax number is [phone].  If you have an urgent issue when the clinic is closed that cannot wait until the next business day, you can page your doctor at the number below.    Please note that while we do our best to be available for urgent issues outside of office hours, we are not available 24/7.   If you have an urgent issue and are unable to reach us, you may choose to seek medical care at your doctor's office, retail clinic, urgent care center, or emergency room.  If you have a medical emergency, please immediately call 911 or go to the  emergency department.  Pager Numbers  - Dr. Kowalski: [phone]  - Dr. Moye: [phone]  - Dr. Stewart: [phone]  In the event of inclement weather, please call our main line at [phone] for an update on the status of any delays or closures.  Dermatology Medication Tips: Please keep the boxes that topical medications come in in order to help keep track of the instructions about where and how to use these. Pharmacies typically print the medication instructions only on the boxes and not directly on the medication tubes.   If your medication is too expensive, please contact our office at [phone] option 4 or send us a message through MyChart.   We are unable to tell what your co-pay for medications will be in advance as this is different depending on your insurance coverage. However, we may be able to find a substitute medication at lower cost or fill out paperwork to get insurance to cover a needed medication.   If a prior authorization is required to get your medication covered by your insurance company, please allow us 1-2 business days to complete this process.  Drug prices often vary depending on where the prescription is filled and some pharmacies may offer cheaper prices.  The website www.goodrx.com contains coupons for medications through different pharmacies. The prices here do not account for what the cost may be with help from insurance (it may be cheaper with your insurance), but the website can   give you the price if you did not use any insurance.  - You can print the associated coupon and take it with your prescription to the pharmacy.  - You may also stop by our office during regular business hours and pick up a GoodRx coupon card.  - If you need your prescription sent electronically to a different pharmacy, notify our office through [facility] MyChart or by phone at [phone] option 4.     Si Usted Necesita Algo Despus de Su Visita  Tambin puede  enviarnos un mensaje a travs de MyChart. Por lo general respondemos a los mensajes de MyChart en el transcurso de 1 a 2 das hbiles.  Para renovar recetas, por favor pida a su farmacia que se ponga en contacto con nuestra oficina. Nuestro nmero de fax es el [phone].  Si tiene un asunto urgente cuando la clnica est cerrada y que no puede esperar hasta el siguiente da hbil, puede llamar/localizar a su doctor(a) al nmero que aparece a continuacin.   Por favor, tenga en cuenta que aunque hacemos todo lo posible para estar disponibles para asuntos urgentes fuera del horario de oficina, no estamos disponibles las 24 horas del da, los 7 das de la semana.   Si tiene un problema urgente y no puede comunicarse con nosotros, puede optar por buscar atencin mdica  en el consultorio de su doctor(a), en una clnica privada, en un centro de atencin urgente o en una sala de emergencias.  Si tiene una emergencia mdica, por favor llame inmediatamente al 911 o vaya a la sala de emergencias.  Nmeros de bper  - Dr. Kowalski: [phone]  - Dra. Moye: [phone]  - Dra. Stewart: [phone]  En caso de inclemencias del tiempo, por favor llame a nuestra lnea principal al [phone] para una actualizacin sobre el estado de cualquier retraso o cierre.  Consejos para la medicacin en dermatologa: Por favor, guarde las cajas en las que vienen los medicamentos de uso tpico para ayudarle a seguir las instrucciones sobre dnde y cmo usarlos. Las farmacias generalmente imprimen las instrucciones del medicamento slo en las cajas y no directamente en los tubos del medicamento.   Si su medicamento es muy caro, por favor, pngase en contacto con nuestra oficina llamando al [phone] y presione la opcin 4 o envenos un mensaje a travs de MyChart.   No podemos decirle cul ser su copago por los medicamentos por adelantado ya que esto es diferente dependiendo de la cobertura de su seguro.  Sin embargo, es posible que podamos encontrar un medicamento sustituto a menor costo o llenar un formulario para que el seguro cubra el medicamento que se considera necesario.   Si se requiere una autorizacin previa para que su compaa de seguros cubra su medicamento, por favor permtanos de 1 a 2 das hbiles para completar este proceso.  Los precios de los medicamentos varan con frecuencia dependiendo del lugar de dnde se surte la receta y alguna farmacias pueden ofrecer precios ms baratos.  El sitio web www.goodrx.com tiene cupones para medicamentos de diferentes farmacias. Los precios aqu no tienen en cuenta lo que podra costar con la ayuda del seguro (puede ser ms barato con su seguro), pero el sitio web puede darle el precio si no utiliz ningn seguro.  - Puede imprimir el cupn correspondiente y llevarlo con su receta a la farmacia.  - Tambin puede pasar por nuestra oficina durante el horario de atencin regular y recoger una tarjeta de cupones de GoodRx.  -   Si necesita que su receta se enve electrnicamente a una farmacia diferente, informe a nuestra oficina a travs de MyChart de [place] o por telfono llamando al [phone] y presione la opcin 4.  

## 2022-04-27 NOTE — Progress Notes (Signed)
Follow-Up Visit   Subjective  Stacie Porter is a 76 y.o. female who presents for the following: Total body skin exam (Hx of BCC nose) and Lipoma (R post shoulder, pt would like recheked). The patient presents for Total-Body Skin Exam (TBSE) for skin cancer screening and mole check.  The patient has spots, moles and lesions to be evaluated, some may be new or changing and the patient has concerns that these could be cancer.   The following portions of the chart were reviewed this encounter and updated as appropriate:   Tobacco  Allergies  Meds  Problems  Med Hx  Surg Hx  Fam Hx     Review of Systems:  No other skin or systemic complaints except as noted in HPI or Assessment and Plan.  Objective  Well appearing patient in no apparent distress; mood and affect are within normal limits.  A full examination was performed including scalp, head, eyes, ears, nose, lips, neck, chest, axillae, abdomen, back, buttocks, bilateral upper extremities, bilateral lower extremities, hands, feet, fingers, toes, fingernails, and toenails. All findings within normal limits unless otherwise noted below.  R post shoulder Rubbery nodule 4.5 x 2.5cm  L cheek, R base of neck Cystic paps L cheek, R base of neck  chest x 4 (4) Stuck on waxy paps with erythema  bil legs Telangiectasias bil legs   Assessment & Plan   Lentigines - Scattered tan macules - Due to sun exposure - Benign-appearing, observe - Recommend daily broad spectrum sunscreen SPF 30+ to sun-exposed areas, reapply every 2 hours as needed. - Call for any changes  Seborrheic Keratoses - Stuck-on, waxy, tan-brown papules and/or plaques  - Benign-appearing - Discussed benign etiology and prognosis. - Observe - Call for any changes - arms  Melanocytic Nevi - Tan-brown and/or pink-flesh-colored symmetric macules and papules - Benign appearing on exam today - Observation - Call clinic for new or changing moles - Recommend  daily use of broad spectrum spf 30+ sunscreen to sun-exposed areas.   Hemangiomas - Red papules - Discussed benign nature - Observe - Call for any changes - scalp, arms  Actinic Damage - Chronic condition, secondary to cumulative UV/sun exposure - diffuse scaly erythematous macules with underlying dyspigmentation - Recommend daily broad spectrum sunscreen SPF 30+ to sun-exposed areas, reapply every 2 hours as needed.  - Staying in the shade or wearing long sleeves, sun glasses (UVA+UVB protection) and wide brim hats (4-inch brim around the entire circumference of the hat) are also recommended for sun protection.  - Call for new or changing lesions.  Skin cancer screening performed today.   History of Basal Cell Carcinoma of the Skin - No evidence of recurrence today - Recommend regular full body skin exams - Recommend daily broad spectrum sunscreen SPF 30+ to sun-exposed areas, reapply every 2 hours as needed.  - Call if any new or changing lesions are noted between office visits  - nose  Lipoma of right upper extremity R post shoulder Benign-appearing.  Observation.  Call clinic for new or changing lesions.  Discussed treatment option for excision and would especially recommend this if symptoms were significant change.    Epidermal cyst L cheek, R base of neck Benign-appearing. Exam most consistent with an epidermal inclusion cyst. Discussed that a cyst is a benign growth that can grow over time and sometimes get irritated or inflamed. Recommend observation if it is not bothersome. Discussed option of surgical excision to remove it if it is growing,  symptomatic, or other changes noted. Please call for new or changing lesions so they can be evaluated.  Inflamed seborrheic keratosis (4) chest x 4 Symptomatic, irritating, patient would like treated.  Destruction of lesion - chest x 4 Complexity: simple   Destruction method: cryotherapy   Informed consent: discussed and consent  obtained   Timeout:  patient name, date of birth, surgical site, and procedure verified Lesion destroyed using liquid nitrogen: Yes   Region frozen until ice ball extended beyond lesion: Yes   Outcome: patient tolerated procedure well with no complications   Post-procedure details: wound care instructions given    Spider veins bil legs Discussed sclerotherapy Benign, observe.    Return in about 1 year (around 04/28/2023) for TBSE, Hx of BCC.  I, Othelia Pulling, RMA, am acting as scribe for Sarina Ser, MD . Documentation: I have reviewed the above documentation for accuracy and completeness, and I agree with the above.  Sarina Ser, MD

## 2022-05-04 ENCOUNTER — Encounter: Payer: Self-pay | Admitting: Dermatology

## 2022-05-17 ENCOUNTER — Encounter: Payer: Self-pay | Admitting: Family Medicine

## 2022-05-25 NOTE — Progress Notes (Signed)
Established patient visit   Patient: Stacie Porter   DOB: 01/03/1946   76 y.o. Female  MRN: [id number] Visit Date: 05/29/2022  Today's healthcare provider: [name], FNP  Re Introduced to nurse practitioner role and practice setting.  All questions answered.  Discussed provider/patient relationship and expectations.  I,Tiffany J Bragg,acting as a scribe for [name], FNP.,have documented all relevant documentation on the behalf of [name], FNP,as directed by  [name], FNP while in the presence of [name], FNP.   Chief Complaint  Patient presents with   Diabetes   Subjective    HPI  Diabetes Mellitus Type II, Follow-up  Lab Results  Component Value Date   HGBA1C 7.1 (A) 05/29/2022   HGBA1C 7.4 (A) 02/23/2022   HGBA1C 7.6 (H) 11/25/2021   Wt Readings from Last 3 Encounters:  05/29/22 226 lb (102.5 kg)  02/15/22 222 lb 3.2 oz (100.8 kg)  01/04/22 221 lb (100.2 kg)   Last seen for diabetes 3 months ago.  Management since then includes continue 10 mg Farxiga, added XR Metformin 500 mg. She reports good compliance with treatment. She is not having side effects.  Symptoms: Yes fatigue No foot ulcerations  No appetite changes No nausea  Yes paresthesia of the feet  No polydipsia  No polyuria No visual disturbances   No vomiting     Home blood sugar records: fasting range: 150s; lunch 140s  Episodes of hypoglycemia? Yes <100 overnight; seen on CGM   Current insulin regiment: none Most Recent Eye Exam: done Dr Gloriann Loan 8/23 Current exercise: walking Current diet habits: in general, a "healthy" diet    Pertinent Labs: Lab Results  Component Value Date   CHOL 157 11/25/2021   HDL 39 (L) 11/25/2021   LDLCALC 90 11/25/2021   TRIG 162 (H) 11/25/2021   CHOLHDL 4.0 11/25/2021   Lab Results  Component Value Date   NA 140 11/25/2021   K 4.3 11/25/2021   CREATININE 0.90 11/25/2021   EGFR 67 11/25/2021   MICROALBUR negative 07/10/2019    LABMICR <3.0 11/25/2021     ---------------------------------------------------------------------------------------------------   Medications: Outpatient Medications Prior to Visit  Medication Sig   aspirin (ASPIRIN LOW DOSE) 81 MG EC tablet Take 1 tablet (81 mg total) by mouth daily. SWALLOW WHOLE.   Calcium Carbonate-Vitamin D 600-200 MG-UNIT CAPS Take 1 tablet by mouth daily.   Continuous Blood Gluc Sensor (FREESTYLE LIBRE 2 SENSOR) MISC 1 each by Does not apply route every 14 (fourteen) days.   dapagliflozin propanediol (FARXIGA) 10 MG TABS tablet Take 1 tablet (10 mg total) by mouth daily before breakfast.   hydrochlorothiazide (HYDRODIURIL) 25 MG tablet Take 1 tablet (25 mg total) by mouth daily. (Patient taking differently: Take 25 mg by mouth daily.)   lisinopril (ZESTRIL) 40 MG tablet Take 1 tablet (40 mg total) by mouth daily.   metFORMIN (GLUCOPHAGE-XR) 500 MG 24 hr tablet Take 1 tablet (500 mg total) by mouth daily with breakfast.   metoprolol tartrate (LOPRESSOR) 25 MG tablet Take 0.5 tablets (12.5 mg total) by mouth 2 (two) times daily.   MULTIPLE VITAMIN PO Take 1 tablet by mouth daily.   Omega-3 Fatty Acids (FISH OIL) 1200 MG CAPS Take 1,200 mg by mouth daily.   omeprazole (PRILOSEC) 20 MG capsule Take 1 capsule (20 mg total) by mouth daily.   simvastatin (ZOCOR) 40 MG tablet Take 1 tablet (40 mg total) by mouth at bedtime.  zinc gluconate 50 MG tablet Take 50 mg by mouth daily.   No facility-administered medications prior to visit.    Review of Systems  Last CBC Lab Results  Component Value Date   WBC 5.8 11/25/2021   HGB 13.5 11/25/2021   HCT 39.7 11/25/2021   MCV 95 11/25/2021   MCH 32.1 11/25/2021   RDW 12.8 11/25/2021   PLT 102 (L) 21/19/4174   Last metabolic panel Lab Results  Component Value Date   GLUCOSE 157 (H) 11/25/2021   NA 140 11/25/2021   K 4.3 11/25/2021   CL 104 11/25/2021   CO2 21 11/25/2021   BUN 30 (H) 11/25/2021   CREATININE 0.90  11/25/2021   EGFR 67 11/25/2021   CALCIUM 9.7 11/25/2021   PROT 6.6 11/25/2021   ALBUMIN 4.4 11/25/2021   LABGLOB 2.2 11/25/2021   AGRATIO 2.0 11/25/2021   BILITOT 0.7 11/25/2021   ALKPHOS 72 11/25/2021   AST 23 11/25/2021   ALT 22 11/25/2021   ANIONGAP 9 11/07/2018   Last lipids Lab Results  Component Value Date   CHOL 157 11/25/2021   HDL 39 (L) 11/25/2021   LDLCALC 90 11/25/2021   TRIG 162 (H) 11/25/2021   CHOLHDL 4.0 11/25/2021   Last hemoglobin A1c Lab Results  Component Value Date   HGBA1C 7.1 (A) 05/29/2022   Last thyroid functions Lab Results  Component Value Date   TSH 1.670 11/25/2021      Objective    BP 114/68 (BP Location: Left Arm, Patient Position: Sitting, Cuff Size: Large)   Pulse 60   Temp 97.8 F (36.6 C) (Oral)   Resp 16   Ht 5' 5" (1.651 m)   Wt 226 lb (102.5 kg)   SpO2 95%   BMI 37.61 kg/m   BP Readings from Last 3 Encounters:  05/29/22 114/68  02/15/22 (!) 136/59  01/04/22 135/63   Wt Readings from Last 3 Encounters:  05/29/22 226 lb (102.5 kg)  02/15/22 222 lb 3.2 oz (100.8 kg)  01/04/22 221 lb (100.2 kg)   SpO2 Readings from Last 3 Encounters:  05/29/22 95%  02/15/22 97%  01/04/22 98%      Physical Exam Vitals and nursing note reviewed.  Constitutional:      General: She is not in acute distress.    Appearance: Normal appearance. She is obese. She is not ill-appearing, toxic-appearing or diaphoretic.  HENT:     Head: Normocephalic and atraumatic.  Cardiovascular:     Rate and Rhythm: Normal rate and regular rhythm.     Pulses: Normal pulses.     Heart sounds: Normal heart sounds. No murmur heard.    No friction rub. No gallop.  Pulmonary:     Effort: Pulmonary effort is normal. No respiratory distress.     Breath sounds: Normal breath sounds. No stridor. No wheezing, rhonchi or rales.  Chest:     Chest wall: No tenderness.  Musculoskeletal:        General: No swelling, tenderness, deformity or signs of injury.  Normal range of motion.     Right lower leg: No edema.     Left lower leg: No edema.  Skin:    General: Skin is warm and dry.     Capillary Refill: Capillary refill takes less than 2 seconds.     Coloration: Skin is not jaundiced or pale.     Findings: No bruising, erythema, lesion or rash.  Neurological:     General: No focal deficit present.  Mental Status: She is alert and oriented to person, place, and time. Mental status is at baseline.     Cranial Nerves: No cranial nerve deficit.     Sensory: No sensory deficit.     Motor: No weakness.     Coordination: Coordination normal.  Psychiatric:        Mood and Affect: Mood normal.        Behavior: Behavior normal.        Thought Content: Thought content normal.        Judgment: Judgment normal.       Results for orders placed or performed in visit on 05/29/22  POCT glycosylated hemoglobin (Hb A1C)  Result Value Ref Range   Hemoglobin A1C 7.1 (A) 4.0 - 5.6 %   Est. average glucose Bld gHb Est-mCnc 157     Assessment & Plan     Problem List Items Addressed This Visit       Cardiovascular and Mediastinum   Hypertension associated with diabetes ([place])    Chronic, at goal Goal of <130/<80 Currently taking HCTZ 25 mg, Lisinopril 40 mg and Metop 12.5 mg BID      Paroxysmal atrial fibrillation (HCC)    Chronic, RC today Hx of Ablation On Metop 12.5 mg BID Denies complaints         Endocrine   Hyperlipidemia associated with type 2 diabetes mellitus (HCC)    Chronic, previously elevated with LDL of 90 Currently taking zocor 40 mg Lifestyle education reinforced       Type 2 diabetes mellitus with hyperglycemia, without long-term current use of insulin (HCC) - Primary    Chronic, improved A1c goal of <7% Continue Farxiga 10 mg and Metformin 500 mg Continue to recommend balanced, lower carb meals. Smaller meal size, adding snacks. Choosing water as drink of choice and increasing purposeful exercise. Will send for  eye exam results       Relevant Orders   POCT glycosylated hemoglobin (Hb A1C) (Completed)     Hematopoietic and Hemostatic   Essential thrombocythemia ([place])    Chronic, stable Patient denies any bleeding No bruising noted today         Other   Morbid obesity (HCC)    Chronic, waxes/wanes Body mass index is 37.61 kg/m. Associated with HTN, HLD, DM Discussed importance of healthy weight management Discussed diet and exercise       Other Visit Diagnoses     Need for influenza vaccination       Relevant Orders   Flu Vaccine QUAD High Dose(Fluad)        Return in about 3 months (around 08/29/2022) for chonic disease management.      Vonna Kotyk, FNP, have reviewed all documentation for this visit. The documentation on 05/29/22 for the exam, diagnosis, procedures, and orders are all accurate and complete.    [name], [place] [phone] (phone) [phone] (fax)  [place]

## 2022-05-29 ENCOUNTER — Encounter: Payer: Self-pay | Admitting: Family Medicine

## 2022-05-29 ENCOUNTER — Ambulatory Visit (INDEPENDENT_AMBULATORY_CARE_PROVIDER_SITE_OTHER): Payer: Medicare Other | Admitting: Family Medicine

## 2022-05-29 VITALS — BP 114/68 | HR 60 | Temp 97.8°F | Resp 16 | Ht 65.0 in | Wt 226.0 lb

## 2022-05-29 DIAGNOSIS — I480 Paroxysmal atrial fibrillation: Secondary | ICD-10-CM | POA: Diagnosis not present

## 2022-05-29 DIAGNOSIS — E785 Hyperlipidemia, unspecified: Secondary | ICD-10-CM

## 2022-05-29 DIAGNOSIS — E1159 Type 2 diabetes mellitus with other circulatory complications: Secondary | ICD-10-CM

## 2022-05-29 DIAGNOSIS — E1169 Type 2 diabetes mellitus with other specified complication: Secondary | ICD-10-CM

## 2022-05-29 DIAGNOSIS — E1165 Type 2 diabetes mellitus with hyperglycemia: Secondary | ICD-10-CM

## 2022-05-29 DIAGNOSIS — Z23 Encounter for immunization: Secondary | ICD-10-CM | POA: Diagnosis not present

## 2022-05-29 DIAGNOSIS — D473 Essential (hemorrhagic) thrombocythemia: Secondary | ICD-10-CM

## 2022-05-29 DIAGNOSIS — I152 Hypertension secondary to endocrine disorders: Secondary | ICD-10-CM

## 2022-05-29 LAB — POCT GLYCOSYLATED HEMOGLOBIN (HGB A1C)
Est. average glucose Bld gHb Est-mCnc: 157
Hemoglobin A1C: 7.1 % — AB (ref 4.0–5.6)

## 2022-05-29 NOTE — Assessment & Plan Note (Signed)
Chronic, at goal Goal of <130/<80 Currently taking HCTZ 25 mg, Lisinopril 40 mg and Metop 12.5 mg BID

## 2022-05-29 NOTE — Assessment & Plan Note (Signed)
Chronic, improved A1c goal of <7% Continue Farxiga 10 mg and Metformin 500 mg Continue to recommend balanced, lower carb meals. Smaller meal size, adding snacks. Choosing water as drink of choice and increasing purposeful exercise. Will send for eye exam results

## 2022-05-29 NOTE — Assessment & Plan Note (Signed)
Chronic, waxes/wanes Body mass index is 37.61 kg/m. Associated with HTN, HLD, DM Discussed importance of healthy weight management Discussed diet and exercise

## 2022-05-29 NOTE — Assessment & Plan Note (Signed)
Chronic, previously elevated with LDL of 90 Currently taking zocor 40 mg Lifestyle education reinforced

## 2022-05-29 NOTE — Assessment & Plan Note (Signed)
Chronic, RC today Hx of Ablation On Metop 12.5 mg BID Denies complaints

## 2022-05-29 NOTE — Assessment & Plan Note (Signed)
Consented; VIS made available; no immediate side effects following administration; plan to repeat annually   

## 2022-05-29 NOTE — Assessment & Plan Note (Signed)
Chronic, stable Patient denies any bleeding No bruising noted today

## 2022-08-03 ENCOUNTER — Ambulatory Visit (INDEPENDENT_AMBULATORY_CARE_PROVIDER_SITE_OTHER): Payer: Medicare Other | Admitting: Family Medicine

## 2022-08-03 ENCOUNTER — Encounter: Payer: Self-pay | Admitting: Family Medicine

## 2022-08-03 VITALS — BP 127/51 | HR 58 | Resp 16 | Ht 65.0 in | Wt 225.0 lb

## 2022-08-03 DIAGNOSIS — E11621 Type 2 diabetes mellitus with foot ulcer: Secondary | ICD-10-CM | POA: Diagnosis not present

## 2022-08-03 DIAGNOSIS — E785 Hyperlipidemia, unspecified: Secondary | ICD-10-CM

## 2022-08-03 DIAGNOSIS — K2100 Gastro-esophageal reflux disease with esophagitis, without bleeding: Secondary | ICD-10-CM

## 2022-08-03 DIAGNOSIS — E1165 Type 2 diabetes mellitus with hyperglycemia: Secondary | ICD-10-CM | POA: Diagnosis not present

## 2022-08-03 DIAGNOSIS — E1159 Type 2 diabetes mellitus with other circulatory complications: Secondary | ICD-10-CM

## 2022-08-03 DIAGNOSIS — E1169 Type 2 diabetes mellitus with other specified complication: Secondary | ICD-10-CM

## 2022-08-03 DIAGNOSIS — I152 Hypertension secondary to endocrine disorders: Secondary | ICD-10-CM

## 2022-08-03 DIAGNOSIS — L97509 Non-pressure chronic ulcer of other part of unspecified foot with unspecified severity: Secondary | ICD-10-CM

## 2022-08-03 LAB — POCT GLYCOSYLATED HEMOGLOBIN (HGB A1C)
Est. average glucose Bld gHb Est-mCnc: 183
Hemoglobin A1C: 8 % — AB (ref 4.0–5.6)

## 2022-08-03 MED ORDER — OMEPRAZOLE 20 MG PO CPDR: 20.0000 mg | DELAYED_RELEASE_CAPSULE | Freq: Every day | ORAL | 3 refills | Status: DC

## 2022-08-03 MED ORDER — METOPROLOL TARTRATE 25 MG PO TABS: 12.5000 mg | ORAL_TABLET | Freq: Two times a day (BID) | ORAL | 3 refills | Status: DC

## 2022-08-03 MED ORDER — LISINOPRIL 40 MG PO TABS
40.0000 mg | ORAL_TABLET | Freq: Every day | ORAL | 3 refills | Status: DC
Start: 2022-08-03 — End: 2023-02-20

## 2022-08-03 MED ORDER — SIMVASTATIN 40 MG PO TABS: 40.0000 mg | ORAL_TABLET | Freq: Every day | ORAL | 3 refills | Status: DC

## 2022-08-03 MED ORDER — METFORMIN HCL ER 500 MG PO TB24
500.0000 mg | ORAL_TABLET | Freq: Two times a day (BID) | ORAL | 3 refills | Status: DC
Start: 2022-08-03 — End: 2023-04-10

## 2022-08-03 MED ORDER — DAPAGLIFLOZIN PROPANEDIOL 10 MG PO TABS: 10.0000 mg | ORAL_TABLET | Freq: Every day | ORAL | 3 refills | Status: DC

## 2022-08-03 NOTE — Progress Notes (Signed)
Established patient visit   Patient: Stacie Porter   DOB: 24-Feb-1946   76 y.o. Female  MRN: [id number] Visit Date: 08/03/2022  Today's healthcare provider: [name], FNP  Re Introduced to nurse practitioner role and practice setting.  All questions answered.  Discussed provider/patient relationship and expectations.  I,Tiffany J Bragg,acting as a scribe for [name], FNP.,have documented all relevant documentation on the behalf of [name], FNP,as directed by  [name], FNP while in the presence of [name], FNP.   Chief Complaint  Patient presents with   Diabetes   Subjective    HPI  Diabetes Mellitus Type II, Follow-up  Lab Results  Component Value Date   HGBA1C 8.0 (A) 08/03/2022   HGBA1C 7.1 (A) 05/29/2022   HGBA1C 7.4 (A) 02/23/2022   Wt Readings from Last 3 Encounters:  08/03/22 225 lb (102.1 kg)  05/29/22 226 lb (102.5 kg)  02/15/22 222 lb 3.2 oz (100.8 kg)   Last seen for diabetes 2 months ago.  Management since then includes continue Farxiga 10 mg and Metformin 500 mg. She reports excellent compliance with treatment. She is not having side effects.  Symptoms: No fatigue No foot ulcerations  No appetite changes No nausea  Yes paresthesia of the feet  Yes polydipsia  Yes polyuria No visual disturbances   No vomiting     Home blood sugar records: weekly average 178  Episodes of hypoglycemia? No    Pertinent Labs: Lab Results  Component Value Date   CHOL 157 11/25/2021   HDL 39 (L) 11/25/2021   LDLCALC 90 11/25/2021   TRIG 162 (H) 11/25/2021   CHOLHDL 4.0 11/25/2021   Lab Results  Component Value Date   NA 140 11/25/2021   K 4.3 11/25/2021   CREATININE 0.90 11/25/2021   EGFR 67 11/25/2021   MICROALBUR negative 07/10/2019   LABMICR <3.0 11/25/2021     ---------------------------------------------------------------------------------------------------   Medications: Outpatient Medications Prior to Visit   Medication Sig   aspirin (ASPIRIN LOW DOSE) 81 MG EC tablet Take 1 tablet (81 mg total) by mouth daily. SWALLOW WHOLE.   Calcium Carbonate-Vitamin D 600-200 MG-UNIT CAPS Take 1 tablet by mouth daily.   Continuous Blood Gluc Sensor (FREESTYLE LIBRE 2 SENSOR) MISC 1 each by Does not apply route every 14 (fourteen) days.   hydrochlorothiazide (HYDRODIURIL) 25 MG tablet Take 1 tablet (25 mg total) by mouth daily. (Patient taking differently: Take 25 mg by mouth daily.)   MULTIPLE VITAMIN PO Take 1 tablet by mouth daily.   Omega-3 Fatty Acids (FISH OIL) 1200 MG CAPS Take 1,200 mg by mouth daily.   zinc gluconate 50 MG tablet Take 50 mg by mouth daily.   [DISCONTINUED] dapagliflozin propanediol (FARXIGA) 10 MG TABS tablet Take 1 tablet (10 mg total) by mouth daily before breakfast.   [DISCONTINUED] lisinopril (ZESTRIL) 40 MG tablet Take 1 tablet (40 mg total) by mouth daily.   [DISCONTINUED] metFORMIN (GLUCOPHAGE-XR) 500 MG 24 hr tablet Take 1 tablet (500 mg total) by mouth daily with breakfast.   [DISCONTINUED] metoprolol tartrate (LOPRESSOR) 25 MG tablet Take 0.5 tablets (12.5 mg total) by mouth 2 (two) times daily.   [DISCONTINUED] omeprazole (PRILOSEC) 20 MG capsule Take 1 capsule (20 mg total) by mouth daily.   [DISCONTINUED] simvastatin (ZOCOR) 40 MG tablet Take 1 tablet (40 mg total) by mouth at bedtime.   No facility-administered medications prior to visit.    Review of Systems  Last CBC Lab Results  Component Value Date   WBC 5.8 11/25/2021   HGB 13.5 11/25/2021   HCT 39.7 11/25/2021   MCV 95 11/25/2021   MCH 32.1 11/25/2021   RDW 12.8 11/25/2021   PLT 102 (L) 11/91/4782   Last metabolic panel Lab Results  Component Value Date   GLUCOSE 157 (H) 11/25/2021   NA 140 11/25/2021   K 4.3 11/25/2021   CL 104 11/25/2021   CO2 21 11/25/2021   BUN 30 (H) 11/25/2021   CREATININE 0.90 11/25/2021   EGFR 67 11/25/2021   CALCIUM 9.7 11/25/2021   PROT 6.6 11/25/2021   ALBUMIN 4.4  11/25/2021   LABGLOB 2.2 11/25/2021   AGRATIO 2.0 11/25/2021   BILITOT 0.7 11/25/2021   ALKPHOS 72 11/25/2021   AST 23 11/25/2021   ALT 22 11/25/2021   ANIONGAP 9 11/07/2018   Last lipids Lab Results  Component Value Date   CHOL 157 11/25/2021   HDL 39 (L) 11/25/2021   LDLCALC 90 11/25/2021   TRIG 162 (H) 11/25/2021   CHOLHDL 4.0 11/25/2021   Last hemoglobin A1c Lab Results  Component Value Date   HGBA1C 8.0 (A) 08/03/2022       Objective    BP (!) 127/51 (BP Location: Right Arm, Patient Position: Sitting, Cuff Size: Large)   Pulse (!) 58   Resp 16   Ht _0  (1.651 m)   Wt 225 lb (102.1 kg)   SpO2 98%   BMI 37.44 kg/m   BP Readings from Last 3 Encounters:  08/03/22 (!) 127/51  05/29/22 114/68  02/15/22 (!) 136/59   Wt Readings from Last 3 Encounters:  08/03/22 225 lb (102.1 kg)  05/29/22 226 lb (102.5 kg)  02/15/22 222 lb 3.2 oz (100.8 kg)   SpO2 Readings from Last 3 Encounters:  08/03/22 98%  05/29/22 95%  02/15/22 97%      Physical Exam Vitals and nursing note reviewed.  Constitutional:      General: She is not in acute distress.    Appearance: Normal appearance. She is obese. She is not ill-appearing, toxic-appearing or diaphoretic.  HENT:     Head: Normocephalic and atraumatic.  Cardiovascular:     Rate and Rhythm: Normal rate and regular rhythm.     Pulses: Normal pulses.     Heart sounds: Normal heart sounds. No murmur heard.    No friction rub. No gallop.  Pulmonary:     Effort: Pulmonary effort is normal. No respiratory distress.     Breath sounds: Normal breath sounds. No stridor. No wheezing, rhonchi or rales.  Chest:     Chest wall: No tenderness.  Abdominal:     Palpations: Abdomen is soft.  Musculoskeletal:        General: No swelling, tenderness, deformity or signs of injury. Normal range of motion.     Right lower leg: No edema.     Left lower leg: No edema.  Skin:    General: Skin is warm and dry.     Capillary Refill:  Capillary refill takes less than 2 seconds.     Coloration: Skin is not jaundiced or pale.     Findings: No bruising, erythema, lesion or rash.  Neurological:     General: No focal deficit present.     Mental Status: She is alert and oriented to person, place, and time. Mental status is at baseline.     Cranial Nerves: No cranial nerve deficit.     Sensory: No sensory  deficit.     Motor: No weakness.     Coordination: Coordination normal.  Psychiatric:        Mood and Affect: Mood normal.        Behavior: Behavior normal.        Thought Content: Thought content normal.        Judgment: Judgment normal.     Results for orders placed or performed in visit on 08/03/22  POCT glycosylated hemoglobin (Hb A1C)  Result Value Ref Range   Hemoglobin A1C 8.0 (A) 4.0 - 5.6 %   Est. average glucose Bld gHb Est-mCnc 183     Assessment & Plan     Problem List Items Addressed This Visit       Cardiovascular and Mediastinum   Hypertension associated with diabetes ([place])    Chronic, stable Controlled with use of Farxiga 10 mg, Lisinopril 40 and HCTZ 25 and 12.5 mg Metop BID      Relevant Medications   metFORMIN (GLUCOPHAGE-XR) 500 MG 24 hr tablet   lisinopril (ZESTRIL) 40 MG tablet   dapagliflozin propanediol (FARXIGA) 10 MG TABS tablet   metoprolol tartrate (LOPRESSOR) 25 MG tablet   simvastatin (ZOCOR) 40 MG tablet     Digestive   Gastroesophageal reflux disease with esophagitis without hemorrhage    Chronic, variable with recent weather changes and dry air Remains on Prilosec 20 mg to assist Notes use of cough drops to assist with cough       Relevant Medications   omeprazole (PRILOSEC) 20 MG capsule     Endocrine   Hyperlipidemia associated with type 2 diabetes mellitus (HCC)    Chronic, previously elevated with LDL of 90 Goal 55-70 On zocor 40 mg Pt endorses dietary indiscretions recommend diet low in saturated fat and regular exercise - 30 min at least 5 times per  week       Relevant Medications   metFORMIN (GLUCOPHAGE-XR) 500 MG 24 hr tablet   lisinopril (ZESTRIL) 40 MG tablet   dapagliflozin propanediol (FARXIGA) 10 MG TABS tablet   metoprolol tartrate (LOPRESSOR) 25 MG tablet   simvastatin (ZOCOR) 40 MG tablet   Type 2 diabetes mellitus with foot ulcer, without long-term current use of insulin (HCC)    Chronic, elevated Home reads up to 240-250s post dinner; no lows. Lowest 130s around 0400. 2 week average of 175/180 Continue to recommend balanced, lower carb meals. Smaller meal size, adding snacks. Choosing water as drink of choice and increasing purposeful exercise. Increase metformin to 500 mg BID; repeat labs in 3 months       Relevant Medications   metFORMIN (GLUCOPHAGE-XR) 500 MG 24 hr tablet   lisinopril (ZESTRIL) 40 MG tablet   dapagliflozin propanediol (FARXIGA) 10 MG TABS tablet   simvastatin (ZOCOR) 40 MG tablet   Type 2 diabetes mellitus with hyperglycemia, without long-term current use of insulin (HCC) - Primary   Relevant Medications   metFORMIN (GLUCOPHAGE-XR) 500 MG 24 hr tablet   lisinopril (ZESTRIL) 40 MG tablet   dapagliflozin propanediol (FARXIGA) 10 MG TABS tablet   simvastatin (ZOCOR) 40 MG tablet   Other Relevant Orders   POCT glycosylated hemoglobin (Hb A1C) (Completed)   Return in about 3 months (around 11/02/2022) for chonic disease management.     Vonna Kotyk, FNP, have reviewed all documentation for this visit. The documentation on 08/03/22 for the exam, diagnosis, procedures, and orders are all accurate and complete.  [name], [place] [phone] (phone)  [phone] (fax)  [place]

## 2022-08-03 NOTE — Assessment & Plan Note (Signed)
Chronic, variable with recent weather changes and dry air Remains on Prilosec 20 mg to assist Notes use of cough drops to assist with cough

## 2022-08-03 NOTE — Assessment & Plan Note (Signed)
Chronic, elevated Home reads up to 240-250s post dinner; no lows. Lowest 130s around 0400. 2 week average of 175/180 Continue to recommend balanced, lower carb meals. Smaller meal size, adding snacks. Choosing water as drink of choice and increasing purposeful exercise. Increase metformin to 500 mg BID; repeat labs in 3 months

## 2022-08-03 NOTE — Assessment & Plan Note (Signed)
Chronic, previously elevated with LDL of 90 Goal 55-70 On zocor 40 mg Pt endorses dietary indiscretions recommend diet low in saturated fat and regular exercise - 30 min at least 5 times per week

## 2022-08-03 NOTE — Assessment & Plan Note (Signed)
Chronic, stable Controlled with use of Farxiga 10 mg, Lisinopril 40 and HCTZ 25 and 12.5 mg Metop BID

## 2022-08-04 ENCOUNTER — Other Ambulatory Visit: Payer: Self-pay | Admitting: Family Medicine

## 2022-08-04 DIAGNOSIS — E1165 Type 2 diabetes mellitus with hyperglycemia: Secondary | ICD-10-CM

## 2022-08-04 NOTE — Telephone Encounter (Signed)
Duplicate request- signed 08/03/22 Requested Prescriptions  Pending Prescriptions Disp Refills   metFORMIN (GLUCOPHAGE-XR) 500 MG 24 hr tablet [Pharmacy Med Name: METFORMIN HCL ER 500 MG TABLET] 90 tablet 1    Sig: TAKE 1 TABLET BY MOUTH [place]     Endocrinology:  Diabetes - Biguanides Failed - 08/04/2022  1:45 AM      Failed - HBA1C is between 0 and 7.9 and within 180 days    Hemoglobin A1C  Date Value Ref Range Status  08/03/2022 8.0 (A) 4.0 - 5.6 % Final   Hgb A1c MFr Bld  Date Value Ref Range Status  11/25/2021 7.6 (H) 4.8 - 5.6 % Final    Comment:             Prediabetes: 5.7 - 6.4          Diabetes: >6.4          Glycemic control for adults with diabetes: <7.0          Failed - B12 Level in normal range and within 720 days    No results found for: "VITAMINB12"       Passed - Cr in normal range and within 360 days    Creatinine  Date Value Ref Range Status  12/03/2014 0.65 mg/dL Final    Comment:    0.44-1.00 NOTE: New Reference Range  11/03/14    Creatinine, Ser  Date Value Ref Range Status  11/25/2021 0.90 0.57 - 1.00 mg/dL Final         Passed - eGFR in normal range and within 360 days    EGFR (African American)  Date Value Ref Range Status  12/03/2014 >60  Final   GFR calc Af Amer  Date Value Ref Range Status  11/04/2019 82 >59 mL/min/1.73 Final   EGFR (Non-African Amer.)  Date Value Ref Range Status  12/03/2014 >60  Final    Comment:    eGFR values <47m/min/1.73 m2 may be an indication of chronic kidney disease (CKD). Calculated eGFR is useful in patients with stable renal function. The eGFR calculation will not be reliable in acutely ill patients when serum creatinine is changing rapidly. It is not useful in patients on dialysis. The eGFR calculation may not be applicable to patients at the low and high extremes of body sizes, pregnant women, and vegetarians.    GFR calc non Af Amer  Date Value Ref Range Status  11/04/2019  71 >59 mL/min/1.73 Final   eGFR  Date Value Ref Range Status  11/25/2021 67 >59 mL/min/1.73 Final         Passed - Valid encounter within last 6 months    Recent Outpatient Visits           Yesterday Type 2 diabetes mellitus with hyperglycemia, without long-term current use of insulin ([facility]   [facility]   2 months ago Type 2 diabetes mellitus with hyperglycemia, without long-term current use of insulin ([facility]   [facility]   5 months ago Type 2 diabetes mellitus with hyperglycemia, without long-term current use of insulin ([facility]   [facility]   7 months ago Acute bronchitis with wheezing   [facility]   8 months ago Type 2 diabetes mellitus with hyperglycemia, without long-term current use of insulin ([facility]   [facility] [place] [place]  Passed - CBC within normal limits and completed in the last 12 months    WBC  Date Value Ref Range Status  11/25/2021 5.8 3.4 - 10.8 x10E3/uL Final  11/07/2018 6.4 4.0 - 10.5 K/uL Final   RBC  Date Value Ref Range Status  11/25/2021 4.20 3.77 - 5.28 x10E6/uL Final  11/07/2018 3.93 3.87 - 5.11 MIL/uL Final   Hemoglobin  Date Value Ref Range Status  11/25/2021 13.5 11.1 - 15.9 g/dL Final   Hematocrit  Date Value Ref Range Status  11/25/2021 39.7 34.0 - 46.6 % Final   MCHC  Date Value Ref Range Status  11/25/2021 34.0 31.5 - 35.7 g/dL Final  11/07/2018 31.8 30.0 - 36.0 g/dL Final   [facility]  Date Value Ref Range Status  11/25/2021 32.1 26.6 - 33.0 pg Final  11/07/2018 31.8 26.0 - 34.0 pg Final   MCV  Date Value Ref Range Status  11/25/2021 95 79 - 97 fL Final  12/03/2014 95 80 - 100 fL Final   No results found for: "PLTCOUNTKUC", "LABPLAT", "POCPLA" RDW  Date Value Ref Range Status  11/25/2021 12.8 11.7 - 15.4 % Final  12/03/2014 13.0 11.5 - 14.5 % Final

## 2022-08-09 ENCOUNTER — Other Ambulatory Visit: Payer: Self-pay | Admitting: Family Medicine

## 2022-08-09 DIAGNOSIS — Z1231 Encounter for screening mammogram for malignant neoplasm of breast: Secondary | ICD-10-CM

## 2022-09-15 ENCOUNTER — Other Ambulatory Visit: Payer: Self-pay | Admitting: Family Medicine

## 2022-09-15 ENCOUNTER — Encounter: Payer: Self-pay | Admitting: Family Medicine

## 2022-09-15 MED ORDER — FLUCONAZOLE 150 MG PO TABS: ORAL_TABLET | ORAL | 0 refills | Status: DC

## 2022-09-15 NOTE — Telephone Encounter (Signed)
Please review.  KP

## 2022-09-20 ENCOUNTER — Ambulatory Visit (INDEPENDENT_AMBULATORY_CARE_PROVIDER_SITE_OTHER): Payer: Medicare Other | Admitting: Family Medicine

## 2022-09-20 ENCOUNTER — Encounter: Payer: Self-pay | Admitting: Family Medicine

## 2022-09-20 VITALS — BP 130/52 | HR 60 | Temp 97.6°F | Wt 225.0 lb

## 2022-09-20 DIAGNOSIS — E1165 Type 2 diabetes mellitus with hyperglycemia: Secondary | ICD-10-CM | POA: Diagnosis not present

## 2022-09-20 DIAGNOSIS — R110 Nausea: Secondary | ICD-10-CM | POA: Insufficient documentation

## 2022-09-20 MED ORDER — ONDANSETRON HCL 4 MG PO TABS: 4.0000 mg | ORAL_TABLET | Freq: Three times a day (TID) | ORAL | 0 refills | Status: DC | PRN

## 2022-09-20 NOTE — Assessment & Plan Note (Signed)
Acute, unknown cause Only change is increased metformin xr 500 mg QD to BID Continue to monitor Ensure routine meal times, balanced meals, and hydration Trial of anti-emetic to assist

## 2022-09-20 NOTE — Assessment & Plan Note (Signed)
Previously titrated metformin from 500 mg QD to BID Patient notes belching and nausea; denies vomiting or diarrhea Review of BG log notes improving trends over past month and more improved in past 90 days Encouraged to take Metformin as 1000 mg in the middle of largest meal Continue to recommend balanced, lower carb meals. Smaller meal size, adding snacks. Choosing water as drink of choice and increasing purposeful exercise.

## 2022-09-20 NOTE — Progress Notes (Signed)
I,Connie R Striblin,acting as a Education administrator for Stacie Sprout, FNP.,have documented all relevant documentation on the behalf of Stacie Sprout, FNP,as directed by  Stacie Sprout, FNP while in the presence of Stacie Sprout, FNP.  Established patient visit  Patient: Stacie Porter   DOB: 1945/10/20   77 y.o. Female  MRN: [id number] Visit Date: 09/20/2022  Today's healthcare provider: [name], FNP  Re Introduced to nurse practitioner role and practice setting.  All questions answered.  Discussed provider/patient relationship and expectations.  Chief Complaint  Patient presents with   Nausea        Subjective    HPI HPI     Nausea    Additional comments:        Last edited by Araceli Bouche, CMA on 09/20/2022 10:20 AM.      Nausea: I have been feeling very nauseous for several weeks. My medication was changed in December- Metformin 500 mg ,patient states that she also has fatty liver which at one time made her feel nauseous. Need to find out what is causing the nausea.  Symptoms started 08/22/22  Medications: Outpatient Medications Prior to Visit  Medication Sig   aspirin (ASPIRIN LOW DOSE) 81 MG EC tablet Take 1 tablet (81 mg total) by mouth daily. SWALLOW WHOLE.   Calcium Carbonate-Vitamin D 600-200 MG-UNIT CAPS Take 1 tablet by mouth daily.   Continuous Blood Gluc Sensor (FREESTYLE LIBRE 2 SENSOR) MISC 1 each by Does not apply route every 14 (fourteen) days.   dapagliflozin propanediol (FARXIGA) 10 MG TABS tablet Take 1 tablet (10 mg total) by mouth daily before breakfast.   fluconazole (DIFLUCAN) 150 MG tablet Take 1 tablet PO, repeat in 4 days if symptoms continue.   hydrochlorothiazide (HYDRODIURIL) 25 MG tablet Take 1 tablet (25 mg total) by mouth daily. (Patient taking differently: Take 25 mg by mouth daily.)   lisinopril (ZESTRIL) 40 MG tablet Take 1 tablet (40 mg total) by mouth daily.   metFORMIN (GLUCOPHAGE-XR) 500 MG 24 hr tablet Take 1 tablet (500 mg total)  by mouth 2 (two) times daily with a meal.   metoprolol tartrate (LOPRESSOR) 25 MG tablet Take 0.5 tablets (12.5 mg total) by mouth 2 (two) times daily.   MULTIPLE VITAMIN PO Take 1 tablet by mouth daily.   Omega-3 Fatty Acids (FISH OIL) 1200 MG CAPS Take 1,200 mg by mouth daily.   omeprazole (PRILOSEC) 20 MG capsule Take 1 capsule (20 mg total) by mouth daily.   simvastatin (ZOCOR) 40 MG tablet Take 1 tablet (40 mg total) by mouth at bedtime.   zinc gluconate 50 MG tablet Take 50 mg by mouth daily.   No facility-administered medications prior to visit.    Review of Systems    Objective    BP (!) 130/52   Pulse 60   Temp 97.6 F (36.4 C) (Oral)   Wt 225 lb (102.1 kg)   SpO2 98%   BMI 37.44 kg/m   Physical Exam Vitals and nursing note reviewed.  Constitutional:      General: She is not in acute distress.    Appearance: Normal appearance. She is obese. She is not ill-appearing, toxic-appearing or diaphoretic.  HENT:     Head: Normocephalic and atraumatic.  Cardiovascular:     Rate and Rhythm: Normal rate and regular rhythm.     Pulses: Normal pulses.     Heart sounds: Normal heart sounds. No murmur heard.    No friction  rub. No gallop.  Pulmonary:     Effort: Pulmonary effort is normal. No respiratory distress.     Breath sounds: Normal breath sounds. No stridor. No wheezing, rhonchi or rales.  Chest:     Chest wall: No tenderness.  Abdominal:     General: Bowel sounds are normal.     Palpations: Abdomen is soft.  Musculoskeletal:        General: No swelling, tenderness, deformity or signs of injury. Normal range of motion.     Right lower leg: No edema.     Left lower leg: No edema.  Skin:    General: Skin is warm and dry.     Capillary Refill: Capillary refill takes less than 2 seconds.     Coloration: Skin is not jaundiced or pale.     Findings: No bruising, erythema, lesion or rash.  Neurological:     General: No focal deficit present.     Mental Status: She is  alert and oriented to person, place, and time. Mental status is at baseline.     Cranial Nerves: No cranial nerve deficit.     Sensory: No sensory deficit.     Motor: No weakness.     Coordination: Coordination normal.  Psychiatric:        Mood and Affect: Mood normal.        Behavior: Behavior normal.        Thought Content: Thought content normal.        Judgment: Judgment normal.     No results found for any visits on 09/20/22.  Assessment & Plan     Problem List Items Addressed This Visit       Endocrine   Type 2 diabetes mellitus with hyperglycemia, without long-term current use of insulin ([place]) - Primary    Previously titrated metformin from 500 mg QD to BID Patient notes belching and nausea; denies vomiting or diarrhea Review of BG log notes improving trends over past month and more improved in past 90 days Encouraged to take Metformin as 1000 mg in the middle of largest meal Continue to recommend balanced, lower carb meals. Smaller meal size, adding snacks. Choosing water as drink of choice and increasing purposeful exercise.         Other   Nausea    Acute, unknown cause Only change is increased metformin xr 500 mg QD to BID Continue to monitor Ensure routine meal times, balanced meals, and hydration Trial of anti-emetic to assist       Return if symptoms worsen or fail to improve.     Vonna Kotyk, FNP, have reviewed all documentation for this visit. The documentation on 09/20/22 for the exam, diagnosis, procedures, and orders are all accurate and complete.  Stacie Porter, [place] [phone] (phone) [phone] (fax)  [place]

## 2022-09-22 ENCOUNTER — Ambulatory Visit
Admission: RE | Admit: 2022-09-22 | Discharge: 2022-09-22 | Disposition: A | Discharge status: Home / Self Care | Payer: Medicare Other | Source: Ambulatory Visit | Attending: Family Medicine | Admitting: Family Medicine

## 2022-09-22 DIAGNOSIS — Z1231 Encounter for screening mammogram for malignant neoplasm of breast: Secondary | ICD-10-CM | POA: Insufficient documentation

## 2022-09-25 NOTE — Progress Notes (Signed)
Hi Ophelia  Normal mammogram; repeat in 1 year.  Please let us know if you have any questions.  Thank you,  Tally Joe, FNP

## 2022-09-26 ENCOUNTER — Encounter: Payer: Self-pay | Admitting: Family Medicine

## 2022-09-26 ENCOUNTER — Other Ambulatory Visit: Payer: Self-pay | Admitting: Family Medicine

## 2022-09-26 MED ORDER — BENZONATATE 200 MG PO CAPS: 200.0000 mg | ORAL_CAPSULE | Freq: Three times a day (TID) | ORAL | 1 refills | Status: DC

## 2022-09-27 ENCOUNTER — Ambulatory Visit (INDEPENDENT_AMBULATORY_CARE_PROVIDER_SITE_OTHER): Payer: Medicare Other | Admitting: Family Medicine

## 2022-09-27 ENCOUNTER — Encounter: Payer: Self-pay | Admitting: Family Medicine

## 2022-09-27 VITALS — BP 111/40 | HR 94 | Temp 98.4°F | Wt 227.4 lb

## 2022-09-27 DIAGNOSIS — R051 Acute cough: Secondary | ICD-10-CM | POA: Diagnosis not present

## 2022-09-27 DIAGNOSIS — U071 COVID-19: Secondary | ICD-10-CM | POA: Insufficient documentation

## 2022-09-27 LAB — POCT INFLUENZA A/B
Influenza A, POC: NEGATIVE
Influenza B, POC: NEGATIVE

## 2022-09-27 LAB — POC COVID19 BINAXNOW: SARS Coronavirus 2 Ag: POSITIVE — AB

## 2022-09-27 MED ORDER — NIRMATRELVIR/RITONAVIR (PAXLOVID)TABLET: 3.0000 | ORAL_TABLET | Freq: Two times a day (BID) | ORAL | 0 refills | Status: AC

## 2022-09-27 NOTE — Assessment & Plan Note (Signed)
POC + Recommend antiviral Hold statin 12 hours before and after use

## 2022-09-27 NOTE — Progress Notes (Signed)
I,Connie R Striblin,acting as a Education administrator for [name], FNP.,have documented all relevant documentation on the behalf of [name], FNP,as directed by  [name], FNP while in the presence of [name], FNP.  Established patient visit   Patient: Stacie Porter   DOB: November 11, 1945   77 y.o. Female  MRN: [id number] Visit Date: 09/27/2022  Today's healthcare provider: [name], FNP   Chief Complaint  Patient presents with   Cough        Subjective    HPI HPI     Cough    Additional comments:        Last edited by Araceli Bouche, CMA on 09/27/2022  1:05 PM.      Cough: Patient complains of cough. Symptoms began 1 day ago. Cough described as non-productive. Patient denies chills and fever. Associated symptoms include nasal congestion. Current treatments have included  OTC cough medication , with fair improvement.    Pt has done no at home testing No known exposure  Medications: Outpatient Medications Prior to Visit  Medication Sig   aspirin (ASPIRIN LOW DOSE) 81 MG EC tablet Take 1 tablet (81 mg total) by mouth daily. SWALLOW WHOLE.   benzonatate (TESSALON) 200 MG capsule Take 1 capsule (200 mg total) by mouth in the morning, at noon, and at bedtime. Take to assist with cough   Calcium Carbonate-Vitamin D 600-200 MG-UNIT CAPS Take 1 tablet by mouth daily.   Continuous Blood Gluc Sensor (FREESTYLE LIBRE 2 SENSOR) MISC 1 each by Does not apply route every 14 (fourteen) days.   dapagliflozin propanediol (FARXIGA) 10 MG TABS tablet Take 1 tablet (10 mg total) by mouth daily before breakfast.   hydrochlorothiazide (HYDRODIURIL) 25 MG tablet Take 1 tablet (25 mg total) by mouth daily. (Patient taking differently: Take 25 mg by mouth daily.)   lisinopril (ZESTRIL) 40 MG tablet Take 1 tablet (40 mg total) by mouth daily.   metFORMIN (GLUCOPHAGE-XR) 500 MG 24 hr tablet Take 1 tablet (500 mg total) by mouth 2 (two) times daily with a meal.   metoprolol tartrate  (LOPRESSOR) 25 MG tablet Take 0.5 tablets (12.5 mg total) by mouth 2 (two) times daily.   MULTIPLE VITAMIN PO Take 1 tablet by mouth daily.   Omega-3 Fatty Acids (FISH OIL) 1200 MG CAPS Take 1,200 mg by mouth daily.   omeprazole (PRILOSEC) 20 MG capsule Take 1 capsule (20 mg total) by mouth daily.   ondansetron (ZOFRAN) 4 MG tablet Take 1 tablet (4 mg total) by mouth every 8 (eight) hours as needed for nausea or vomiting.   simvastatin (ZOCOR) 40 MG tablet Take 1 tablet (40 mg total) by mouth at bedtime.   zinc gluconate 50 MG tablet Take 50 mg by mouth daily.   fluconazole (DIFLUCAN) 150 MG tablet Take 1 tablet PO, repeat in 4 days if symptoms continue. (Patient not taking: Reported on 09/27/2022)   No facility-administered medications prior to visit.    Review of Systems     Objective    BP (!) 111/40 (BP Location: Left Arm, Patient Position: Sitting, Cuff Size: Large)   Pulse 94   Temp 98.4 F (36.9 C) (Oral)   Wt 227 lb 6.4 oz (103.1 kg)   SpO2 97%   BMI 37.84 kg/m    Physical Exam Vitals and nursing note reviewed.  Constitutional:      General: She is not in acute distress.    Appearance: Normal  appearance. She is obese. She is ill-appearing. She is not toxic-appearing or diaphoretic.  HENT:     Head: Normocephalic and atraumatic.  Cardiovascular:     Rate and Rhythm: Normal rate.     Pulses: Normal pulses.  Pulmonary:     Effort: Pulmonary effort is normal.  Musculoskeletal:        General: No swelling, tenderness, deformity or signs of injury. Normal range of motion.     Right lower leg: No edema.     Left lower leg: No edema.  Skin:    General: Skin is warm and dry.     Capillary Refill: Capillary refill takes less than 2 seconds.     Coloration: Skin is not jaundiced or pale.     Findings: No bruising, erythema, lesion or rash.  Neurological:     General: No focal deficit present.     Mental Status: She is alert and oriented to person, place, and time. Mental  status is at baseline.     Cranial Nerves: No cranial nerve deficit.     Sensory: No sensory deficit.     Motor: No weakness.     Coordination: Coordination normal.  Psychiatric:        Mood and Affect: Mood normal.        Behavior: Behavior normal.        Thought Content: Thought content normal.        Judgment: Judgment normal.     Results for orders placed or performed in visit on 09/27/22  POCT Influenza A/B  Result Value Ref Range   Influenza A, POC Negative Negative   Influenza B, POC Negative Negative  POC COVID-19  Result Value Ref Range   SARS Coronavirus 2 Ag Positive (A) Negative    Assessment & Plan     Problem List Items Addressed This Visit       Other   Acute cough    Reports symptoms not relieved with OTC medication; asked for tessalon yesterday Continues to use mucinex       Relevant Orders   POCT Influenza A/B (Completed)   POC COVID-19 (Completed)   COVID-19 - Primary    POC + Recommend antiviral Hold statin 12 hours before and after use       Relevant Medications   nirmatrelvir/ritonavir (PAXLOVID) 20 x 150 MG & 10 x '100MG'$  TABS   Return if symptoms worsen or fail to improve.     Vonna Kotyk, FNP, have reviewed all documentation for this visit. The documentation on 09/27/22 for the exam, diagnosis, procedures, and orders are all accurate and complete.  [name], [employer] [phone] (phone) [phone] (fax)  [place]

## 2022-09-27 NOTE — Assessment & Plan Note (Signed)
Reports symptoms not relieved with OTC medication; asked for tessalon yesterday Continues to use mucinex

## 2022-09-30 ENCOUNTER — Encounter: Payer: Self-pay | Admitting: Family Medicine

## 2022-10-02 ENCOUNTER — Other Ambulatory Visit: Payer: Self-pay | Admitting: Family Medicine

## 2022-10-02 ENCOUNTER — Encounter: Payer: Self-pay | Admitting: Family Medicine

## 2022-10-10 LAB — HM DIABETES EYE EXAM

## 2022-11-06 ENCOUNTER — Encounter: Payer: Self-pay | Admitting: Family Medicine

## 2022-11-06 ENCOUNTER — Ambulatory Visit (INDEPENDENT_AMBULATORY_CARE_PROVIDER_SITE_OTHER): Payer: Medicare Other | Admitting: Family Medicine

## 2022-11-06 VITALS — BP 120/56 | HR 67 | Wt 223.0 lb

## 2022-11-06 DIAGNOSIS — E1165 Type 2 diabetes mellitus with hyperglycemia: Secondary | ICD-10-CM

## 2022-11-06 DIAGNOSIS — E1159 Type 2 diabetes mellitus with other circulatory complications: Secondary | ICD-10-CM | POA: Diagnosis not present

## 2022-11-06 DIAGNOSIS — I152 Hypertension secondary to endocrine disorders: Secondary | ICD-10-CM

## 2022-11-06 LAB — POCT GLYCOSYLATED HEMOGLOBIN (HGB A1C): Hemoglobin A1C: 7.6 % — AB (ref 4.0–5.6)

## 2022-11-06 NOTE — Assessment & Plan Note (Signed)
Chronic, improved A1c now <8% Continue to recommend balanced, lower carb meals. Smaller meal size, adding snacks. Choosing water as drink of choice and increasing purposeful exercise. Has completed eye appt; no concerns. Will send for records from Dr Gloriann Loan Using CGM; occasional reads p meal mid 200s Discussed adding more protein Ex breakfast today- 2 [place] bacon, keto bread, fruit, unsweet tea Continue farxiga 10, metformin 500 mg BID On ace/ on statin

## 2022-11-06 NOTE — Assessment & Plan Note (Signed)
Chronic, improved DM with HTN, HLD Body mass index is 37.11 kg/m.

## 2022-11-06 NOTE — Progress Notes (Signed)
I,Sha'taria Tyson,acting as a Education administrator for [name], FNP.,have documented all relevant documentation on the behalf of [name], FNP,as directed by  [name], FNP while in the presence of [name], FNP.   Established patient visit   Patient: Stacie Porter   DOB: Jan 12, 1946   77 y.o. Female  MRN: [id number] Visit Date: 11/06/2022  Today's healthcare provider: [name], FNP  Re Introduced to nurse practitioner role and practice setting.  All questions answered.  Discussed provider/patient relationship and expectations.  Subjective    HPI  Diabetes Mellitus Type II, Follow-up  Lab Results  Component Value Date   HGBA1C 8.0 (A) 08/03/2022   HGBA1C 7.1 (A) 05/29/2022   HGBA1C 7.4 (A) 02/23/2022   Wt Readings from Last 3 Encounters:  11/06/22 223 lb (101.2 kg)  09/27/22 227 lb 6.4 oz (103.1 kg)  09/20/22 225 lb (102.1 kg)   Last seen for diabetes 2 months ago.  Management since then includes encouraged to take Metformin as 1000 mg in the middle of largest meal. She reports excellent compliance with treatment. She is not having side effects.  Symptoms: Yes fatigue No foot ulcerations  No appetite changes Yes nausea  Yes paresthesia of the feet  No polydipsia  No polyuria No visual disturbances   No vomiting     Home blood sugar records: postprandial range: 245  this morning  Episodes of hypoglycemia? No    Current insulin regiment: none Most Recent Eye Exam: 2/24; will call for records from Dr Gloriann Loan  Pertinent Labs: Lab Results  Component Value Date   CHOL 157 11/25/2021   HDL 39 (L) 11/25/2021   LDLCALC 90 11/25/2021   TRIG 162 (H) 11/25/2021   CHOLHDL 4.0 11/25/2021   Lab Results  Component Value Date   NA 140 11/25/2021   K 4.3 11/25/2021   CREATININE 0.90 11/25/2021   EGFR 67 11/25/2021   LABMICR <3.0 11/25/2021   MICRALBCREAT <5 11/25/2021      ---------------------------------------------------------------------------------------------------   Medications: Outpatient Medications Prior to Visit  Medication Sig   aspirin (ASPIRIN LOW DOSE) 81 MG EC tablet Take 1 tablet (81 mg total) by mouth daily. SWALLOW WHOLE.   benzonatate (TESSALON) 200 MG capsule Take 1 capsule (200 mg total) by mouth in the morning, at noon, and at bedtime. Take to assist with cough   Calcium Carbonate-Vitamin D 600-200 MG-UNIT CAPS Take 1 tablet by mouth daily.   Continuous Blood Gluc Sensor (FREESTYLE LIBRE 2 SENSOR) MISC 1 each by Does not apply route every 14 (fourteen) days.   dapagliflozin propanediol (FARXIGA) 10 MG TABS tablet Take 1 tablet (10 mg total) by mouth daily before breakfast.   fluconazole (DIFLUCAN) 150 MG tablet Take 1 tablet PO, repeat in 4 days if symptoms continue. (Patient not taking: Reported on 09/27/2022)   hydrochlorothiazide (HYDRODIURIL) 25 MG tablet Take 1 tablet (25 mg total) by mouth daily. (Patient taking differently: Take 25 mg by mouth daily.)   lisinopril (ZESTRIL) 40 MG tablet Take 1 tablet (40 mg total) by mouth daily.   metFORMIN (GLUCOPHAGE-XR) 500 MG 24 hr tablet Take 1 tablet (500 mg total) by mouth 2 (two) times daily with a meal.   metoprolol tartrate (LOPRESSOR) 25 MG tablet Take 0.5 tablets (12.5 mg total) by mouth 2 (two) times daily.   MULTIPLE VITAMIN PO Take 1 tablet by mouth daily.   Omega-3 Fatty Acids (FISH OIL) 1200 MG CAPS Take 1,200 mg by mouth  daily.   omeprazole (PRILOSEC) 20 MG capsule Take 1 capsule (20 mg total) by mouth daily.   ondansetron (ZOFRAN) 4 MG tablet Take 1 tablet (4 mg total) by mouth every 8 (eight) hours as needed for nausea or vomiting.   simvastatin (ZOCOR) 40 MG tablet Take 1 tablet (40 mg total) by mouth at bedtime.   zinc gluconate 50 MG tablet Take 50 mg by mouth daily.   No facility-administered medications prior to visit.    Review of Systems  Last CBC Lab Results   Component Value Date   WBC 5.8 11/25/2021   HGB 13.5 11/25/2021   HCT 39.7 11/25/2021   MCV 95 11/25/2021   MCH 32.1 11/25/2021   RDW 12.8 11/25/2021   PLT 102 (L) 123XX123   Last metabolic panel Lab Results  Component Value Date   GLUCOSE 157 (H) 11/25/2021   NA 140 11/25/2021   K 4.3 11/25/2021   CL 104 11/25/2021   CO2 21 11/25/2021   BUN 30 (H) 11/25/2021   CREATININE 0.90 11/25/2021   EGFR 67 11/25/2021   CALCIUM 9.7 11/25/2021   PROT 6.6 11/25/2021   ALBUMIN 4.4 11/25/2021   LABGLOB 2.2 11/25/2021   AGRATIO 2.0 11/25/2021   BILITOT 0.7 11/25/2021   ALKPHOS 72 11/25/2021   AST 23 11/25/2021   ALT 22 11/25/2021   ANIONGAP 9 11/07/2018   Last lipids Lab Results  Component Value Date   CHOL 157 11/25/2021   HDL 39 (L) 11/25/2021   LDLCALC 90 11/25/2021   TRIG 162 (H) 11/25/2021   CHOLHDL 4.0 11/25/2021   Last hemoglobin A1c Lab Results  Component Value Date   HGBA1C 8.0 (A) 08/03/2022       Objective    BP (!) 120/56 (BP Location: Left Arm, Patient Position: Sitting, Cuff Size: Large)   Pulse 67   Wt 223 lb (101.2 kg)   SpO2 100%   BMI 37.11 kg/m  BP Readings from Last 3 Encounters:  11/06/22 (!) 120/56  09/27/22 (!) 111/40  09/20/22 (!) 130/52   Wt Readings from Last 3 Encounters:  11/06/22 223 lb (101.2 kg)  09/27/22 227 lb 6.4 oz (103.1 kg)  09/20/22 225 lb (102.1 kg)   SpO2 Readings from Last 3 Encounters:  11/06/22 100%  09/27/22 97%  09/20/22 98%      Physical Exam Vitals and nursing note reviewed.  Constitutional:      General: She is not in acute distress.    Appearance: Normal appearance. She is obese. She is not ill-appearing, toxic-appearing or diaphoretic.  HENT:     Head: Normocephalic and atraumatic.  Cardiovascular:     Rate and Rhythm: Normal rate and regular rhythm.     Pulses: Normal pulses.     Heart sounds: Normal heart sounds. No murmur heard.    No friction rub. No gallop.  Pulmonary:     Effort: Pulmonary  effort is normal. No respiratory distress.     Breath sounds: Normal breath sounds. No stridor. No wheezing, rhonchi or rales.  Chest:     Chest wall: No tenderness.  Musculoskeletal:        General: No swelling, tenderness, deformity or signs of injury. Normal range of motion.     Right lower leg: No edema.     Left lower leg: No edema.  Skin:    General: Skin is warm and dry.     Capillary Refill: Capillary refill takes less than 2 seconds.     Coloration: Skin  is not jaundiced or pale.     Findings: No bruising, erythema, lesion or rash.  Neurological:     General: No focal deficit present.     Mental Status: She is alert and oriented to person, place, and time. Mental status is at baseline.     Cranial Nerves: No cranial nerve deficit.     Sensory: No sensory deficit.     Motor: No weakness.     Coordination: Coordination normal.  Psychiatric:        Mood and Affect: Mood normal.        Behavior: Behavior normal.        Thought Content: Thought content normal.        Judgment: Judgment normal.     No results found for any visits on 11/06/22.  Assessment & Plan     Problem List Items Addressed This Visit       Cardiovascular and Mediastinum   Hypertension associated with diabetes ([place])    Chronic, stable Goal <130/<80 Continue 12.5 mg metop BID, lisinopril 40 mg, hctz 25 mg, farxiga 10        Endocrine   Type 2 diabetes mellitus with hyperglycemia, without long-term current use of insulin ([facility]) - Primary    Chronic, improved A1c now <8% Continue to recommend balanced, lower carb meals. Smaller meal size, adding snacks. Choosing water as drink of choice and increasing purposeful exercise. Has completed eye appt; no concerns. Will send for records from Dr Gloriann Loan Using CGM; occasional reads p meal mid 200s Discussed adding more protein Ex breakfast today- 2 [place] bacon, keto bread, fruit, unsweet tea Continue farxiga 10, metformin 500 mg BID On ace/ on statin        Relevant Orders   POCT HgB A1C     Other   Morbid obesity ([place])    Chronic, improved DM with HTN, HLD Body mass index is 37.11 kg/m.       Return in about 3 months (around 02/06/2023) for chonic disease management.     Vonna Kotyk, FNP, have reviewed all documentation for this visit. The documentation on 11/06/22 for the exam, diagnosis, procedures, and orders are all accurate and complete.  [name], [place] [phone] (phone) [phone] (fax)  [place]

## 2022-11-06 NOTE — Assessment & Plan Note (Signed)
Chronic, stable Goal <130/<80 Continue 12.5 mg metop BID, lisinopril 40 mg, hctz 25 mg, farxiga 10

## 2022-11-09 ENCOUNTER — Encounter: Payer: Self-pay | Admitting: Family Medicine

## 2022-11-27 ENCOUNTER — Ambulatory Visit (INDEPENDENT_AMBULATORY_CARE_PROVIDER_SITE_OTHER): Payer: Medicare Other

## 2022-11-27 VITALS — Ht 65.0 in | Wt 223.0 lb

## 2022-11-27 DIAGNOSIS — Z0000 Encounter for general adult medical examination without abnormal findings: Secondary | ICD-10-CM | POA: Diagnosis not present

## 2022-11-27 NOTE — Patient Instructions (Signed)
[name] , Thank you for taking time to come for your Medicare Wellness Visit. I appreciate your ongoing commitment to your health goals. Please review the following plan we discussed and let me know if I can assist you in the future.   These are the goals we discussed:  Goals      care management activities     Care Coordination Interventions:   Case Management program offered/dicussed Confirmed PCP appointment scheduled for 05/29/22 Confirmed annual wellness visit scheduled for 11/27/22 Patient denied having ay nursing or community resource needs     DIET - EAT MORE FRUITS AND VEGETABLES     DIET - [place] current diabetic diet changes to help aid with diabetes and weight loss.      Exercise 3x per week (30 min per time)     Recommend to exercise for 3 days a week for at least 30 minutes at a time.         This is a list of the screening recommended for you and due dates:  Health Maintenance  Topic Date Due   COVID-19 Vaccine (3 - Moderna risk series) 12/04/2019   Zoster (Shingles) Vaccine (2 of 2) 04/03/2022   DEXA scan (bone density measurement)  07/04/2022   Yearly kidney function blood test for diabetes  11/26/2022   Yearly kidney health urinalysis for diabetes  11/26/2022   Flu Shot  03/29/2023   Hemoglobin A1C  05/09/2023   Complete foot exam   05/30/2023   Eye exam for diabetics  10/11/2023   Medicare Annual Wellness Visit  11/27/2023   DTaP/Tdap/Td vaccine (5 - Td or Tdap) 09/23/2024   Colon Cancer Screening  02/12/2028   Pneumonia Vaccine  Completed   Hepatitis C Screening: USPSTF Recommendation to screen - Ages 68-79 yo.  Completed   HPV Vaccine  Aged Out    Advanced directives: yes  Conditions/risks identified: none  Next appointment: Follow up in one year for your annual wellness visit 11/28/2023 @9 :15 am telephone   Preventive Care 65 Years and Older, Female Preventive care refers to lifestyle choices and visits with your  health care provider that can promote health and wellness. What does preventive care include? A yearly physical exam. This is also called an annual well check. Dental exams once or twice a year. Routine eye exams. Ask your health care provider how often you should have your eyes checked. Personal lifestyle choices, including: Daily care of your teeth and gums. Regular physical activity. Eating a healthy diet. Avoiding tobacco and drug use. Limiting alcohol use. Practicing safe sex. Taking low-dose aspirin every day. Taking vitamin and mineral supplements as recommended by your health care provider. What happens during an annual well check? The services and screenings done by your health care provider during your annual well check will depend on your age, overall health, lifestyle risk factors, and family history of disease. Counseling  Your health care provider may ask you questions about your: Alcohol use. Tobacco use. Drug use. Emotional well-being. Home and relationship well-being. Sexual activity. Eating habits. History of falls. Memory and ability to understand (cognition). Work and work Statistician. Reproductive health. Screening  You may have the following tests or measurements: Height, weight, and BMI. Blood pressure. Lipid and cholesterol levels. These may be checked every 5 years, or more frequently if you are over 12 years old. Skin check. Lung cancer screening. You may have this screening every year starting at age 66 if  you have a 30-pack-year history of smoking and currently smoke or have quit within the past 15 years. Fecal occult blood test (FOBT) of the stool. You may have this test every year starting at age 82. Flexible sigmoidoscopy or colonoscopy. You may have a sigmoidoscopy every 5 years or a colonoscopy every 10 years starting at age 61. Hepatitis C blood test. Hepatitis B blood test. Sexually transmitted disease (STD) testing. Diabetes screening. This  is done by checking your blood sugar (glucose) after you have not eaten for a while (fasting). You may have this done every 1-3 years. Bone density scan. This is done to screen for osteoporosis. You may have this done starting at age 31. Mammogram. This may be done every 1-2 years. Talk to your health care provider about how often you should have regular mammograms. Talk with your health care provider about your test results, treatment options, and if necessary, the need for more tests. Vaccines  Your health care provider may recommend certain vaccines, such as: Influenza vaccine. This is recommended every year. Tetanus, diphtheria, and acellular pertussis (Tdap, Td) vaccine. You may need a Td booster every 10 years. Zoster vaccine. You may need this after age 55. Pneumococcal 13-valent conjugate (PCV13) vaccine. One dose is recommended after age 19. Pneumococcal polysaccharide (PPSV23) vaccine. One dose is recommended after age 75. Talk to your health care provider about which screenings and vaccines you need and how often you need them. This information is not intended to replace advice given to you by your health care provider. Make sure you discuss any questions you have with your health care provider. Document Released: 09/10/2015 Document Revised: 05/03/2016 Document Reviewed: 06/15/2015 Elsevier Interactive Patient Education  2017 [place] Prevention in the Home Falls can cause injuries. They can happen to people of all ages. There are many things you can do to make your home safe and to help prevent falls. What can I do on the outside of my home? Regularly fix the edges of walkways and driveways and fix any cracks. Remove anything that might make you trip as you walk through a door, such as a raised step or threshold. Trim any bushes or trees on the path to your home. Use bright outdoor lighting. Clear any walking paths of anything that might make someone trip, such as  rocks or tools. Regularly check to see if handrails are loose or broken. Make sure that both sides of any steps have handrails. Any raised decks and porches should have guardrails on the edges. Have any leaves, snow, or ice cleared regularly. Use sand or salt on walking paths during winter. Clean up any spills in your garage right away. This includes oil or grease spills. What can I do in the bathroom? Use night lights. Install grab bars by the toilet and in the tub and shower. Do not use towel bars as grab bars. Use non-skid mats or decals in the tub or shower. If you need to sit down in the shower, use a plastic, non-slip stool. Keep the floor dry. Clean up any water that spills on the floor as soon as it happens. Remove soap buildup in the tub or shower regularly. Attach bath mats securely with double-sided non-slip rug tape. Do not have throw rugs and other things on the floor that can make you trip. What can I do in the bedroom? Use night lights. Make sure that you have a light by your bed that is easy to reach. Do not  use any sheets or blankets that are too big for your bed. They should not hang down onto the floor. Have a firm chair that has side arms. You can use this for support while you get dressed. Do not have throw rugs and other things on the floor that can make you trip. What can I do in the kitchen? Clean up any spills right away. Avoid walking on wet floors. Keep items that you use a lot in easy-to-reach places. If you need to reach something above you, use a strong step stool that has a grab bar. Keep electrical cords out of the way. Do not use floor polish or wax that makes floors slippery. If you must use wax, use non-skid floor wax. Do not have throw rugs and other things on the floor that can make you trip. What can I do with my stairs? Do not leave any items on the stairs. Make sure that there are handrails on both sides of the stairs and use them. Fix handrails  that are broken or loose. Make sure that handrails are as long as the stairways. Check any carpeting to make sure that it is firmly attached to the stairs. Fix any carpet that is loose or worn. Avoid having throw rugs at the top or bottom of the stairs. If you do have throw rugs, attach them to the floor with carpet tape. Make sure that you have a light switch at the top of the stairs and the bottom of the stairs. If you do not have them, ask someone to add them for you. What else can I do to help prevent falls? Wear shoes that: Do not have high heels. Have rubber bottoms. Are comfortable and fit you well. Are closed at the toe. Do not wear sandals. If you use a stepladder: Make sure that it is fully opened. Do not climb a closed stepladder. Make sure that both sides of the stepladder are locked into place. Ask someone to hold it for you, if possible. Clearly mark and make sure that you can see: Any grab bars or handrails. First and last steps. Where the edge of each step is. Use tools that help you move around (mobility aids) if they are needed. These include: Canes. Walkers. Scooters. Crutches. Turn on the lights when you go into a dark area. Replace any light bulbs as soon as they burn out. Set up your furniture so you have a clear path. Avoid moving your furniture around. If any of your floors are uneven, fix them. If there are any pets around you, be aware of where they are. Review your medicines with your doctor. Some medicines can make you feel dizzy. This can increase your chance of falling. Ask your doctor what other things that you can do to help prevent falls. This information is not intended to replace advice given to you by your health care provider. Make sure you discuss any questions you have with your health care provider. Document Released: 06/10/2009 Document Revised: 01/20/2016 Document Reviewed: 09/18/2014 Elsevier Interactive Patient Education  2017 [employer].

## 2022-11-27 NOTE — Progress Notes (Signed)
I connected with  Stacie Porter on 11/27/22 by a audio enabled telemedicine application and verified that I am speaking with the correct person using two identifiers.  Patient Location: Home  Provider Location: Office/Clinic  I discussed the limitations of evaluation and management by telemedicine. The patient expressed understanding and agreed to proceed.  Subjective:   Stacie Porter is a 77 y.o. female who presents for Medicare Annual (Subsequent) preventive examination.  Review of Systems     Cardiac Risk Factors include: advanced age (>51men, >48 women);diabetes mellitus;dyslipidemia;hypertension;obesity (BMI >30kg/m2)     Objective:    Today's Vitals   11/27/22 0831  Weight: 223 lb (101.2 kg)  Height: 5\' 5"  (1.651 m)   Body mass index is 37.11 kg/m.     11/27/2022    8:37 AM 11/22/2021    9:08 AM 02/11/2021    7:06 AM 11/16/2020    8:51 AM 10/04/2020    2:33 PM 11/13/2019    1:30 PM 08/12/2019   10:36 AM  Advanced Directives  Does Patient Have a Medical Advance Directive? Yes Yes Yes Yes Yes Yes Yes  Type of Corporate treasurer of [place];Living will  [place];Living will [place];Living will [place];Living will [place];Living will  Does patient want to make changes to medical advance directive?  Yes (Inpatient - patient defers changing a medical advance directive and declines information at this time)   No - Patient declined    Copy of [place] in Chart?  Yes - validated most recent copy scanned in chart (See row information)  Yes - validated most recent copy scanned in chart (See row information)  No - copy requested No - copy requested    Current Medications (verified) Outpatient Encounter Medications as of 11/27/2022  Medication Sig   aspirin (ASPIRIN LOW DOSE) 81 MG EC tablet Take 1 tablet (81 mg total) by mouth daily. SWALLOW WHOLE.   benzonatate  (TESSALON) 200 MG capsule Take 1 capsule (200 mg total) by mouth in the morning, at noon, and at bedtime. Take to assist with cough   Calcium Carbonate-Vitamin D 600-200 MG-UNIT CAPS Take 1 tablet by mouth daily.   Continuous Blood Gluc Sensor (FREESTYLE LIBRE 2 SENSOR) MISC 1 each by Does not apply route every 14 (fourteen) days.   dapagliflozin propanediol (FARXIGA) 10 MG TABS tablet Take 1 tablet (10 mg total) by mouth daily before breakfast.   fluconazole (DIFLUCAN) 150 MG tablet Take 1 tablet PO, repeat in 4 days if symptoms continue. (Patient not taking: Reported on 09/27/2022)   hydrochlorothiazide (HYDRODIURIL) 25 MG tablet Take 1 tablet (25 mg total) by mouth daily. (Patient taking differently: Take 25 mg by mouth daily.)   lisinopril (ZESTRIL) 40 MG tablet Take 1 tablet (40 mg total) by mouth daily.   metFORMIN (GLUCOPHAGE-XR) 500 MG 24 hr tablet Take 1 tablet (500 mg total) by mouth 2 (two) times daily with a meal.   metoprolol tartrate (LOPRESSOR) 25 MG tablet Take 0.5 tablets (12.5 mg total) by mouth 2 (two) times daily.   MULTIPLE VITAMIN PO Take 1 tablet by mouth daily.   Omega-3 Fatty Acids (FISH OIL) 1200 MG CAPS Take 1,200 mg by mouth daily.   omeprazole (PRILOSEC) 20 MG capsule Take 1 capsule (20 mg total) by mouth daily.   ondansetron (ZOFRAN) 4 MG tablet Take 1 tablet (4 mg total) by mouth every 8 (eight) hours as needed for nausea  or vomiting.   simvastatin (ZOCOR) 40 MG tablet Take 1 tablet (40 mg total) by mouth at bedtime.   zinc gluconate 50 MG tablet Take 50 mg by mouth daily.   No facility-administered encounter medications on file as of 11/27/2022.    Allergies (verified) Multaq [dronedarone]   History: Past Medical History:  Diagnosis Date   Cataract    Diabetes mellitus without complication    type 2   Dysrhythmia    a fib   GERD (gastroesophageal reflux disease)    Hx of basal cell carcinoma ~2014 Mohs   Nose   Hyperlipidemia    Hypertension     Osteomyelitis of second toe of right foot 11/11/2018   Sleep apnea    cpap   Past Surgical History:  Procedure Laterality Date   AMPUTATION TOE Right 11/11/2018   Procedure: AMPUTATION RIGHT 2ND TOE;  Surgeon: Dorna Leitz, MD;  Location: [facility];  Service: Orthopedics;  Laterality: Right;   APPENDECTOMY     CARDIAC ELECTROPHYSIOLOGY STUDY AND ABLATION     cather ablation  2011/2014   cardiac ablation for a fib   COLONOSCOPY WITH PROPOFOL N/A 02/11/2021   Procedure: COLONOSCOPY WITH PROPOFOL;  Surgeon: Virgel Manifold, MD;  Location: [facility] END[facility]COPY;  Service: Endoscopy;  Laterality: N/A;   EYE SURGERY Bilateral 05/2014   cataract   HAMMER TOE SURGERY Right    HAND SURGERY Bilateral    LAPAROSCOPIC OOPHERECTOMY     recession of gums sx     TUBAL LIGATION     Family History  Problem Relation Age of Onset   Ovarian cancer Sister    Cancer Sister        ovarian   Lung cancer Sister    Cancer Sister        lung and thyroid   Thyroid cancer Sister    Cancer Sister    Heart disease Mother    Heart disease Father    Hypertension Father    Diabetes Maternal Uncle    Diabetes Maternal Uncle    Breast cancer Neg Hx    Social History   Socioeconomic History   Marital status: Married    Spouse name: Not on file   Number of children: 3   Years of education: Not on file   Highest education level: Some college, no degree  Occupational History   Occupation: retired    Comment: part time subsitute   Tobacco Use   Smoking status: Never   Smokeless tobacco: Never  Vaping Use   Vaping Use: Never used  Substance and Sexual Activity   Alcohol use: No   Drug use: No   Sexual activity: Yes    Birth control/protection: None  Other Topics Concern   Not on file  Social History Narrative   Not on file   Social Determinants of Health   Financial Resource Strain: Low Risk  (11/24/2022)   Overall Financial Resource Strain (CARDIA)    Difficulty of Paying Living Expenses: Not  hard at all  Food Insecurity: No Food Insecurity (11/24/2022)   Hunger Vital Sign    Worried About Running Out of Food in the Last Year: Never true    Ran Out of Food in the Last Year: Never true  Transportation Needs: No Transportation Needs (11/24/2022)   PRAPARE - Hydrologist (Medical): No    Lack of Transportation (Non-Medical): No  Physical Activity: Insufficiently Active (11/24/2022)   Exercise Vital Sign  Days of Exercise per Week: 4 days    Minutes of Exercise per Session: 30 min  Stress: No Stress Concern Present (11/24/2022)   [place]    Feeling of Stress : Not at all  Social Connections: Unknown (11/24/2022)   Social Connection and Isolation Panel [NHANES]    Frequency of Communication with Friends and Family: More than three times a week    Frequency of Social Gatherings with Friends and Family: Three times a week    Attends Religious Services: Not on file    Active Member of Clubs or Organizations: Yes    Attends Archivist Meetings: More than 4 times per year    Marital Status: Married    Tobacco Counseling Counseling given: Not Answered   Clinical Intake:     Pain : No/denies pain     BMI - recorded: 37.11 Nutritional Status: BMI > 30  Obese Nutritional Risks: None Diabetes: Yes  How often do you need to have someone help you when you read instructions, pamphlets, or other written materials from your doctor or pharmacy?: 1 - Never  Diabetic?yes  Interpreter Needed?: No  Information entered by :: B.Lisabeth Mian,LPN   Activities of Daily Living    11/24/2022    8:14 PM 08/03/2022    1:35 PM  In your present state of health, do you have any difficulty performing the following activities:  Hearing? 0 0  Vision? 0 0  Difficulty concentrating or making decisions? 0 0  Walking or climbing stairs? 0 0  Dressing or bathing? 0 0  Doing errands,  shopping? 0 0  Preparing Food and eating ? N   Using the Toilet? N   In the past six months, have you accidently leaked urine? Y   Do you have problems with loss of bowel control? N   Managing your Medications? N   Managing your Finances? N   Housekeeping or managing your Housekeeping? N     Patient Care Team: [name], FNP as PCP - General (Family Medicine) Lorelee Cover., MD (Ophthalmology) Corey Skains, MD as Consulting Physician (Cardiology) Ralene Bathe, MD (Dermatology) Samara Deist, DPM as Referring Physician (Podiatry) Georgina Peer, RD as Dietitian (Dietician)  Indicate any recent Medical Services you may have received from other than [facility] providers in the past year (date may be approximate).     Assessment:   This is a routine wellness examination for Stacie Porter.  Hearing/Vision screen Hearing Screening - Comments:: Adequate hearing w/hearing aides Vision Screening - Comments:: Adequate vision after cataract surgery Dr Gloriann Loan  Dietary issues and exercise activities discussed: Current Exercise Habits: Home exercise routine, Type of exercise: walking, Time (Minutes): 30, Frequency (Times/Week): 3, Weekly Exercise (Minutes/Week): 90, Intensity: Mild, Exercise limited by: None identified   Goals Addressed             This Visit's Progress    DIET - EAT MORE FRUITS AND VEGETABLES   On track    DIET - REDUCE CALORIE INTAKE   On track    Continue current diabetic diet changes to help aid with diabetes and weight loss.      Exercise 3x per week (30 min per time)   On track    Recommend to exercise for 3 days a week for at least 30 minutes at a time.        Depression Screen    11/27/2022    8:36 AM 08/03/2022  1:35 PM 05/29/2022   10:08 AM 02/15/2022   10:06 AM 11/22/2021    9:06 AM 11/11/2021    1:16 PM 09/20/2021    1:20 PM  PHQ 2/9 Scores  PHQ - 2 Score 0 0 0 0 0 0 0  PHQ- 9 Score  0 1 0 0 0     Fall Risk    11/24/2022    8:14 PM  08/03/2022    1:35 PM 05/29/2022   10:08 AM 02/15/2022   10:05 AM 12/31/2021   12:50 PM  Fall Risk   Falls in the past year? 0 0 0 1 1  Number falls in past yr:  0 0 0 0  Injury with Fall?  0 0 0   Risk for fall due to :  No Fall Risks No Fall Risks    Follow up  Falls evaluation completed Falls evaluation completed      [place]:  Any stairs in or around the home? No  If so, are there any without handrails? No  Home free of loose throw rugs in walkways, pet beds, electrical cords, etc? Yes  Adequate lighting in your home to reduce risk of falls? Yes   ASSISTIVE DEVICES UTILIZED TO PREVENT FALLS:  Life alert? No  Use of a cane, walker or w/c? No  Grab bars in the bathroom? Yes  Shower chair or bench in shower? Yes  Elevated toilet seat or a handicapped toilet? Yes    Cognitive Function:        11/27/2022    8:21 AM  6CIT Screen  What Year? 0 points  What month? 0 points  What time? 0 points  Count back from 20 0 points  Months in reverse 0 points  Repeat phrase 0 points  Total Score 0 points   Immunizations Immunization History  Administered Date(s) Administered   Fluad Quad(high Dose 65+) 05/21/2020, 05/25/2021, 05/29/2022   Influenza Split 06/23/2006, 05/29/2009, 06/15/2010   Influenza, High Dose Seasonal PF 06/24/2014, 06/22/2017   Influenza,inj,Quad PF,6+ Mos 06/29/2015   Influenza-Unspecified 05/01/2018, 05/05/2019   Moderna Sars-Covid-2 Vaccination 10/09/2019, 11/06/2019   Pneumococcal Conjugate-13 09/23/2014   Pneumococcal Polysaccharide-23 07/01/2012   Td 08/28/1997, 03/20/2008   Td (Adult), 2 Lf Tetanus Toxid, Preservative Free 08/28/1997, 03/20/2008   Tdap 09/23/2014   Zoster Recombinat (Shingrix) 02/06/2022    TDAP status: Up to date  Flu Vaccine status: Up to date  Pneumococcal vaccine status: Up to date  Covid-19 vaccine status: Completed vaccines  Qualifies for Shingles Vaccine? Yes   Zostavax completed  Yes   Shingrix Completed?: Yes  Screening Tests Health Maintenance  Topic Date Due   COVID-19 Vaccine (3 - Moderna risk series) 12/04/2019   Zoster Vaccines- Shingrix (2 of 2) 04/03/2022   DEXA SCAN  07/04/2022   Diabetic kidney evaluation - eGFR measurement  11/26/2022   Diabetic kidney evaluation - Urine ACR  11/26/2022   INFLUENZA VACCINE  03/29/2023   HEMOGLOBIN A1C  05/09/2023   FOOT EXAM  05/30/2023   OPHTHALMOLOGY EXAM  10/11/2023   Medicare Annual Wellness (AWV)  11/27/2023   DTaP/Tdap/Td (5 - Td or Tdap) 09/23/2024   COLONOSCOPY (Pts 45-56yrs Insurance coverage will need to be confirmed)  02/12/2028   Pneumonia Vaccine 20+ Years old  Completed   Hepatitis C Screening  Completed   HPV VACCINES  Aged Out    Health Maintenance  Health Maintenance Due  Topic Date Due   COVID-19 Vaccine (3 -  Moderna risk series) 12/04/2019   Zoster Vaccines- Shingrix (2 of 2) 04/03/2022   DEXA SCAN  07/04/2022   Diabetic kidney evaluation - eGFR measurement  11/26/2022   Diabetic kidney evaluation - Urine ACR  11/26/2022    Colorectal cancer screening: No longer required.   Mammogram status: No longer required due to age.  Bone Density status: Completed yes. Results reflect: Bone density results: NORMAL. Repeat every 5 years.  Lung Cancer Screening: (Low Dose CT Chest recommended if Age 36-80 years, 30 pack-year currently smoking OR have quit w/in 15years.) does not qualify.   Lung Cancer Screening Referral: no  Additional Screening:  Hepatitis C Screening: does not qualify; Completed yes  Vision Screening: Recommended annual ophthalmology exams for early detection of glaucoma and other disorders of the eye. Is the patient up to date with their annual eye exam?  Yes  Who is the provider or what is the name of the office in which the patient attends annual eye exams? Dr Gloriann Loan If pt is not established with a provider, would they like to be referred to a provider to establish care?  No .   Dental Screening: Recommended annual dental exams for proper oral hygiene  Community Resource Referral / Chronic Care Management: CRR required this visit?  No   CCM required this visit?  No      Plan:     I have personally reviewed and noted the following in the patient's chart:   Medical and social history Use of alcohol, tobacco or illicit drugs  Current medications and supplements including opioid prescriptions. Patient is not currently taking opioid prescriptions. Functional ability and status Nutritional status Physical activity Advanced directives List of other physicians Hospitalizations, surgeries, and ER visits in previous 12 months Vitals Screenings to include cognitive, depression, and falls Referrals and appointments  In addition, I have reviewed and discussed with patient certain preventive protocols, quality metrics, and best practice recommendations. A written personalized care plan for preventive services as well as general preventive health recommendations were provided to patient.     Roger Shelter, LPN   [id number]   Nurse Notes: The patient states they are doing well and has no concerns or questions at this time.

## 2022-11-28 ENCOUNTER — Emergency Department
Admission: EM | Admit: 2022-11-28 | Discharge: 2022-11-28 | Disposition: A | Discharge status: Home / Self Care | Payer: Medicare Other | Attending: Emergency Medicine | Admitting: Emergency Medicine

## 2022-11-28 ENCOUNTER — Other Ambulatory Visit: Payer: Self-pay

## 2022-11-28 DIAGNOSIS — I4891 Unspecified atrial fibrillation: Secondary | ICD-10-CM | POA: Insufficient documentation

## 2022-11-28 DIAGNOSIS — E119 Type 2 diabetes mellitus without complications: Secondary | ICD-10-CM | POA: Diagnosis not present

## 2022-11-28 DIAGNOSIS — I10 Essential (primary) hypertension: Secondary | ICD-10-CM | POA: Diagnosis not present

## 2022-11-28 LAB — CBC
HCT: 40.6 % (ref 36.0–46.0)
Hemoglobin: 13.9 g/dL (ref 12.0–15.0)
MCH: 31.6 pg (ref 26.0–34.0)
MCHC: 34.2 g/dL (ref 30.0–36.0)
MCV: 92.3 fL (ref 80.0–100.0)
Platelets: 94 10*3/uL — ABNORMAL LOW (ref 150–400)
RBC: 4.4 MIL/uL (ref 3.87–5.11)
RDW: 13.2 % (ref 11.5–15.5)
WBC: 5.4 10*3/uL (ref 4.0–10.5)
nRBC: 0 % (ref 0.0–0.2)

## 2022-11-28 LAB — BASIC METABOLIC PANEL
Anion gap: 10 (ref 5–15)
BUN: 35 mg/dL — ABNORMAL HIGH (ref 8–23)
CO2: 22 mmol/L (ref 22–32)
Calcium: 9.6 mg/dL (ref 8.9–10.3)
Chloride: 105 mmol/L (ref 98–111)
Creatinine, Ser: 0.94 mg/dL (ref 0.44–1.00)
GFR, Estimated: >60 mL/min (ref >60)
Glucose, Bld: 193 mg/dL — ABNORMAL HIGH (ref 70–99)
Potassium: 3.8 mmol/L (ref 3.5–5.1)
Sodium: 137 mmol/L (ref 135–145)

## 2022-11-28 NOTE — Discharge Instructions (Signed)
Your exam and blood tests were unremarkable today.  Continue taking your medications as prescribed, and follow-up with cardiology for further evaluation.

## 2022-11-28 NOTE — ED Provider Notes (Signed)
[facility] Provider Note    Event Date/Time   First MD Initiated Contact with Patient 11/28/22 [phone]     (approximate)   History   Chief Complaint: Hypertension   HPI  Stacie Porter is a 77 y.o. female with a history of diabetes, hypertension, atrial fibrillation status post ablation who comes ED complaining of a vague odd feeling when she woke up this morning that reminded her of when she had atrial fibrillation in the past.  Denies chest pain shortness of breath dizziness weakness or fall.  She checked her blood pressure and noticed that it was elevated at about [id number] systolic.  She has taken her blood pressure medicine this morning since then prior to arrival in the ED.  No other acute complaints.  No exertional symptoms.   Outside records from cardiology clinic May 20, 2022 reviewed by me, noting patient has not had any atrial fibrillation recurrences since her ablation in 2014.  She is not anticoagulated due to patient preference, just takes aspirin daily.     Physical Exam   Triage Vital Signs: ED Triage Vitals  Enc Vitals Group     BP 11/28/22 0739 (!) 178/80     Pulse Rate 11/28/22 0739 92     Resp 11/28/22 0739 16     Temp 11/28/22 0739 97.9 F (36.6 C)     Temp Source 11/28/22 0739 Oral     SpO2 11/28/22 0739 97 %     Weight --      Height --      Head Circumference --      Peak Flow --      Pain Score 11/28/22 0738 0     Pain Loc --      Pain Edu? --      Excl. in [place]? --     Most recent vital signs: Vitals:   11/28/22 0739  BP: (!) 178/80  Pulse: 92  Resp: 16  Temp: 97.9 F (36.6 C)  SpO2: 97%    General: Awake, no distress.  CV:  Good peripheral perfusion.  Regular rate rhythm, symmetric distal pulses Resp:  Normal effort.  Lungs clear bilaterally Abd:  No distention.  Other:  No lower extremity edema, no rash   ED Results / Procedures / Treatments   Labs (all labs ordered are listed, but only abnormal results  are displayed) Labs Reviewed  CBC - Abnormal; Notable for the following components:      Result Value   Platelets 94 (*)    All other components within normal limits  BASIC METABOLIC PANEL - Abnormal; Notable for the following components:   Glucose, Bld 193 (*)    BUN 35 (*)    All other components within normal limits     EKG Interpreted by me Sinus rhythm rate of 83.  Left axis, normal intervals.  Poor R wave progression.  Normal ST segments and T waves.   RADIOLOGY    PROCEDURES:  Procedures   MEDICATIONS ORDERED IN ED: Medications - No data to display   IMPRESSION / MDM / [place] / ED COURSE  I reviewed the triage vital signs and the nursing notes.  DDx: Essential hypertension, paroxysmal atrial fibrillation, electrolyte abnormality, dehydration  Patient's presentation is most consistent with acute presentation with potential threat to life or bodily function.  Patient presents with elevated blood pressure at home.  In triage patient's blood pressure was measured at about 180/80, but by the time  she arrived to the treatment room, blood pressure was 138/70 and she is feeling completely normal.  No worrisome associated symptoms.  Serum labs are okay, exam is normal.  Recommend she continue her blood pressure medications and other medications as prescribed, follow-up with cardiology for further evaluation.       FINAL CLINICAL IMPRESSION(S) / ED DIAGNOSES   Final diagnoses:  Hypertension, unspecified type  Type 2 diabetes mellitus without complication, without long-term current use of insulin  Atrial fibrillation, unspecified type     Rx / DC Orders   ED Discharge Orders     None        Note:  This document was prepared using Dragon voice recognition software and may include unintentional dictation errors.   Carrie Mew, MD 11/28/22 [phone]

## 2022-11-28 NOTE — ED Notes (Signed)
Pt states that she noticed that she was feeling  more tired than normal yesterday, states that she didn't over do it on Sunday and states that she was able to do her 30 min walk as usual yesterday, states this am when she went to the bathroom she felt like her heart was racing like it did before she had her ablation for a.fib. pt's heart rate is currently in the upper 70's low 80's and regular, no distress noted at this time and pt able to walk to and from the bathroom without any difficulty or sob

## 2022-11-28 NOTE — ED Triage Notes (Signed)
Pt presents to ED with recordings of hypertension. NAD noted. Pt states she woke up and "didnt feel right'. Pt states HX of hypertension and takes BP meds and also a-fib. Pt ambulatory with steady gait.

## 2022-12-01 ENCOUNTER — Telehealth: Payer: Self-pay

## 2022-12-01 NOTE — Telephone Encounter (Signed)
        Patient  visited [place] on 4/2     Telephone encounter attempt :  1st  A HIPAA compliant voice message was left requesting a return call.  Instructed patient to call back     Lenard Forth [facility] Guide, [facility] [phone] [address] [place], [place], [place] [address] Phone: [phone] Email: [email or web address]

## 2022-12-01 NOTE — Telephone Encounter (Signed)
     Patient  visit on 4/2  at [facility]   Have you been able to follow up with your primary care physician? Yes   The patient was or was not able to obtain any needed medicine or equipment. Yes   Are there diet recommendations that you are having difficulty following? Na   Patient expresses understanding of discharge instructions and education provided has no other needs at this time.  Yes     Stacie Porter [facility] Guide, [place] [phone] [address], [place], [place] [address] Phone: [phone] Email: [email or web address]    

## 2022-12-03 ENCOUNTER — Other Ambulatory Visit: Payer: Self-pay | Admitting: Family Medicine

## 2022-12-03 DIAGNOSIS — E1159 Type 2 diabetes mellitus with other circulatory complications: Secondary | ICD-10-CM

## 2022-12-12 ENCOUNTER — Encounter: Payer: Self-pay | Admitting: Family Medicine

## 2022-12-12 ENCOUNTER — Other Ambulatory Visit: Payer: Self-pay | Admitting: Family Medicine

## 2022-12-12 MED ORDER — FLUCONAZOLE 150 MG PO TABS: ORAL_TABLET | ORAL | 0 refills | Status: DC

## 2023-01-09 ENCOUNTER — Encounter: Payer: Medicare Other | Admitting: Family Medicine

## 2023-01-17 ENCOUNTER — Ambulatory Visit (INDEPENDENT_AMBULATORY_CARE_PROVIDER_SITE_OTHER): Payer: Medicare Other | Admitting: Family Medicine

## 2023-01-17 VITALS — BP 126/56 | HR 64 | Ht 65.0 in | Wt 226.2 lb

## 2023-01-17 DIAGNOSIS — E785 Hyperlipidemia, unspecified: Secondary | ICD-10-CM

## 2023-01-17 DIAGNOSIS — E1165 Type 2 diabetes mellitus with hyperglycemia: Secondary | ICD-10-CM | POA: Diagnosis not present

## 2023-01-17 DIAGNOSIS — E1159 Type 2 diabetes mellitus with other circulatory complications: Secondary | ICD-10-CM | POA: Diagnosis not present

## 2023-01-17 DIAGNOSIS — E1169 Type 2 diabetes mellitus with other specified complication: Secondary | ICD-10-CM | POA: Diagnosis not present

## 2023-01-17 DIAGNOSIS — I152 Hypertension secondary to endocrine disorders: Secondary | ICD-10-CM

## 2023-01-17 NOTE — Assessment & Plan Note (Signed)
Chronic, elevated BG consistent with hyperglycemia Last A1c >7% Goal <8% Continue to recommend balanced, lower carb meals. Smaller meal size, adding snacks. Choosing water as drink of choice and increasing purposeful exercise. Repeat A1c and urine micro Reports FBG up to 170s with post prandial of high 200s Continues on farxiga 10 mg, lisinopril 40 mg, 500 mg metformin BID

## 2023-01-17 NOTE — Assessment & Plan Note (Signed)
Chronic, stable without large weight gain/loss Body mass index is 37.64 kg/m. Discussed importance of healthy weight management Discussed diet and exercise

## 2023-01-17 NOTE — Progress Notes (Signed)
I,Sha'taria Tyson,acting as a Neurosurgeon for Jacky Kindle, FNP.,have documented all relevant documentation on the behalf of Jacky Kindle, FNP,as directed by  Jacky Kindle, FNP while in the presence of Jacky Kindle, FNP.   Established patient visit  Patient: Stacie Porter   DOB: [birth year]   77 y.o. Female  MRN: [id number] Visit Date: 01/17/2023  Today's healthcare provider: Jacky Kindle, FNP  Re Introduced to nurse practitioner role and practice setting.  All questions answered.  Discussed provider/patient relationship and expectations.  Subjective    HPI  Patient reports that her blood sugar seems to be staying high all the time and would like to have it checked. Patient last A1c was checked on 11/06/22 and was 7.6%. Patient reports checking her sugar this morning fasting and was 175. Reports she feels fine with some numbness in her feet  Medications: Outpatient Medications Prior to Visit  Medication Sig   aspirin (ASPIRIN LOW DOSE) 81 MG EC tablet Take 1 tablet (81 mg total) by mouth daily. SWALLOW WHOLE.   Calcium Carbonate-Vitamin D 600-200 MG-UNIT CAPS Take 1 tablet by mouth daily.   Continuous Blood Gluc Sensor (FREESTYLE LIBRE 2 SENSOR) MISC 1 each by Does not apply route every 14 (fourteen) days.   dapagliflozin propanediol (FARXIGA) 10 MG TABS tablet Take 1 tablet (10 mg total) by mouth daily before breakfast.   hydrochlorothiazide (HYDRODIURIL) 25 MG tablet TAKE 1 TABLET (25 MG TOTAL) BY MOUTH DAILY.   lisinopril (ZESTRIL) 40 MG tablet Take 1 tablet (40 mg total) by mouth daily.   metFORMIN (GLUCOPHAGE-XR) 500 MG 24 hr tablet Take 1 tablet (500 mg total) by mouth 2 (two) times daily with a meal.   metoprolol tartrate (LOPRESSOR) 25 MG tablet Take 0.5 tablets (12.5 mg total) by mouth 2 (two) times daily.   MULTIPLE VITAMIN PO Take 1 tablet by mouth daily.   Omega-3 Fatty Acids (FISH OIL) 1200 MG CAPS Take 1,200 mg by mouth daily.   omeprazole (PRILOSEC) 20 MG capsule Take 1  capsule (20 mg total) by mouth daily.   simvastatin (ZOCOR) 40 MG tablet Take 1 tablet (40 mg total) by mouth at bedtime.   zinc gluconate 50 MG tablet Take 50 mg by mouth daily.   [DISCONTINUED] benzonatate (TESSALON) 200 MG capsule Take 1 capsule (200 mg total) by mouth in the morning, at noon, and at bedtime. Take to assist with cough   [DISCONTINUED] fluconazole (DIFLUCAN) 150 MG tablet Take 1 tablet PO, repeat in 4 days if symptoms continue. (Patient not taking: Reported on 01/17/2023)   [DISCONTINUED] ondansetron (ZOFRAN) 4 MG tablet Take 1 tablet (4 mg total) by mouth every 8 (eight) hours as needed for nausea or vomiting.   No facility-administered medications prior to visit.   Review of Systems  Last CBC Lab Results  Component Value Date   WBC 5.4 11/28/2022   HGB 13.9 11/28/2022   HCT 40.6 11/28/2022   MCV 92.3 11/28/2022   MCH 31.6 11/28/2022   RDW 13.2 11/28/2022   PLT 94 (L) 11/28/2022   Last metabolic panel Lab Results  Component Value Date   GLUCOSE 193 (H) 11/28/2022   NA 137 11/28/2022   K 3.8 11/28/2022   CL 105 11/28/2022   CO2 22 11/28/2022   BUN 35 (H) 11/28/2022   CREATININE 0.94 11/28/2022   GFRNONAA >60 11/28/2022   CALCIUM 9.6 11/28/2022   PROT 6.6 11/25/2021   ALBUMIN 4.4 11/25/2021   LABGLOB 2.2 11/25/2021  AGRATIO 2.0 11/25/2021   BILITOT 0.7 11/25/2021   ALKPHOS 72 11/25/2021   AST 23 11/25/2021   ALT 22 11/25/2021   ANIONGAP 10 11/28/2022   Last lipids Lab Results  Component Value Date   CHOL 157 11/25/2021   HDL 39 (L) 11/25/2021   LDLCALC 90 11/25/2021   TRIG 162 (H) 11/25/2021   CHOLHDL 4.0 11/25/2021   Last hemoglobin A1c Lab Results  Component Value Date   HGBA1C 7.6 (A) 11/06/2022       Objective    BP (!) 126/56 (BP Location: Left Arm, Patient Position: Sitting, Cuff Size: Normal)   Pulse 64   Ht 5\' 5"  (1.651 m)   Wt 226 lb 3.2 oz (102.6 kg)   SpO2 96%   BMI 37.64 kg/m   BP Readings from Last 3 Encounters:   01/17/23 (!) 126/56  11/28/22 (!) 120/53  11/06/22 (!) 120/56   Wt Readings from Last 3 Encounters:  01/17/23 226 lb 3.2 oz (102.6 kg)  11/27/22 223 lb (101.2 kg)  11/06/22 223 lb (101.2 kg)   SpO2 Readings from Last 3 Encounters:  01/17/23 96%  11/28/22 97%  11/06/22 100%   Physical Exam Vitals and nursing note reviewed.  Constitutional:      General: She is not in acute distress.    Appearance: Normal appearance. She is obese. She is not ill-appearing, toxic-appearing or diaphoretic.  HENT:     Head: Normocephalic and atraumatic.  Cardiovascular:     Rate and Rhythm: Normal rate and regular rhythm.     Pulses: Normal pulses.  Pulmonary:     Effort: Pulmonary effort is normal.  Musculoskeletal:        General: No swelling, tenderness, deformity or signs of injury. Normal range of motion.     Right lower leg: No edema.     Left lower leg: No edema.  Skin:    General: Skin is warm and dry.     Capillary Refill: Capillary refill takes less than 2 seconds.     Coloration: Skin is not jaundiced or pale.     Findings: No bruising, erythema, lesion or rash.  Neurological:     General: No focal deficit present.     Mental Status: She is alert and oriented to person, place, and time. Mental status is at baseline.     Cranial Nerves: No cranial nerve deficit.     Sensory: No sensory deficit.     Motor: No weakness.     Coordination: Coordination normal.  Psychiatric:        Mood and Affect: Mood is anxious.        Behavior: Behavior normal.        Thought Content: Thought content normal.        Judgment: Judgment normal.     No results found for any visits on 01/17/23.  Assessment & Plan     Problem List Items Addressed This Visit       Cardiovascular and Mediastinum   Hypertension associated with diabetes (HCC)    Chronic, stable Borderline with SBP at 126 Goal of 129/79 or less with DM Continues on farxiga 10 mg, hctz 25 mg, metop 12.5 mg BID         Endocrine   Hyperlipidemia associated with type 2 diabetes mellitus (HCC)    Chronic, previously elevated with LDL at 90 On zocor 40 mg  Repeat FLP recommend diet low in saturated fat and regular exercise - 30 min at least 5  times per week The 10-year ASCVD risk score (Arnett DK, et al., 2019) is: 38.1%       Relevant Orders   Lipid panel   Type 2 diabetes mellitus with hyperglycemia, without long-term current use of insulin (HCC) - Primary    Chronic, elevated BG consistent with hyperglycemia Last A1c >7% Goal <8% Continue to recommend balanced, lower carb meals. Smaller meal size, adding snacks. Choosing water as drink of choice and increasing purposeful exercise. Repeat A1c and urine micro Reports FBG up to 170s with post prandial of high 200s Continues on farxiga 10 mg, lisinopril 40 mg, 500 mg metformin BID      Relevant Orders   Hemoglobin A1c   Urine Microalbumin w/creat. ratio     Other   Morbid obesity (HCC)    Chronic, stable without large weight gain/loss Body mass index is 37.64 kg/m. Discussed importance of healthy weight management Discussed diet and exercise       Relevant Orders   Lipid panel   Hemoglobin A1c   Urine Microalbumin w/creat. ratio   Return if symptoms worsen or fail to improve.     Jacky Kindle, FNP  [facility] [phone] (phone) [phone] (fax)  [facility]

## 2023-01-17 NOTE — Assessment & Plan Note (Signed)
Chronic, stable Borderline with SBP at 126 Goal of 129/79 or less with DM Continues on farxiga 10 mg, hctz 25 mg, metop 12.5 mg BID

## 2023-01-17 NOTE — Assessment & Plan Note (Signed)
Chronic, previously elevated with LDL at 90 On zocor 40 mg  Repeat FLP recommend diet low in saturated fat and regular exercise - 30 min at least 5 times per week The 10-year ASCVD risk score (Arnett DK, et al., 2019) is: 38.1%

## 2023-02-06 ENCOUNTER — Other Ambulatory Visit: Payer: Self-pay | Admitting: Family Medicine

## 2023-02-06 ENCOUNTER — Encounter: Payer: Medicare Other | Admitting: Family Medicine

## 2023-02-06 LAB — HEMOGLOBIN A1C
Est. average glucose Bld gHb Est-mCnc: 177 mg/dL
Hgb A1c MFr Bld: 7.8 % — ABNORMAL HIGH (ref 4.8–5.6)

## 2023-02-06 LAB — MICROALBUMIN / CREATININE URINE RATIO
Creatinine, Urine: 65.6 mg/dL
Microalb/Creat Ratio: <5 mg/g creat (ref 0–29)
Microalbumin, Urine: <3 ug/mL

## 2023-02-06 LAB — LIPID PANEL
Chol/HDL Ratio: 3.9 ratio (ref 0.0–4.4)
Cholesterol, Total: 151 mg/dL (ref 100–199)
HDL: 39 mg/dL — ABNORMAL LOW (ref >39)
LDL Chol Calc (NIH): 82 mg/dL (ref 0–99)
Triglycerides: 174 mg/dL — ABNORMAL HIGH (ref 0–149)
VLDL Cholesterol Cal: 30 mg/dL (ref 5–40)

## 2023-02-06 MED ORDER — TIRZEPATIDE 2.5 MG/0.5ML ~~LOC~~ SOAJ: 2.5000 mg | SUBCUTANEOUS | 0 refills | Status: DC

## 2023-02-06 MED ORDER — ROSUVASTATIN CALCIUM 40 MG PO TABS
40.00 mg | ORAL_TABLET | Freq: Every day | ORAL | 3 refills | Status: DC
Start: 2023-02-06 — End: 2023-03-06

## 2023-02-06 NOTE — Patient Instructions (Signed)
   The CDC recommends two doses of Shingrix (the shingles vaccine) separated by 2 to 6 months for adults age 76 years and older. I recommend checking with your insurance plan regarding coverage for this vaccine.   

## 2023-02-06 NOTE — Progress Notes (Addendum)
Established patient visit   Patient: Stacie Porter   DOB: 1946-06-22   77 y.o. Female  MRN: [id number] Visit Date: 02/07/2023  Today's healthcare provider: Jacky Kindle, FNP  Re Introduced to nurse practitioner role and practice setting.  All questions answered.  Discussed provider/patient relationship and expectations.  Subjective    HPI   Lab follow up; plan to start Mounjaro to assist with DM control and obesity  Medications: Outpatient Medications Prior to Visit  Medication Sig   aspirin (ASPIRIN LOW DOSE) 81 MG EC tablet Take 1 tablet (81 mg total) by mouth daily. SWALLOW WHOLE.   Calcium Carbonate-Vitamin D 600-200 MG-UNIT CAPS Take 1 tablet by mouth daily.   Continuous Blood Gluc Sensor (FREESTYLE LIBRE 2 SENSOR) MISC 1 each by Does not apply route every 14 (fourteen) days.   dapagliflozin propanediol (FARXIGA) 10 MG TABS tablet Take 1 tablet (10 mg total) by mouth daily before breakfast.   hydrochlorothiazide (HYDRODIURIL) 25 MG tablet TAKE 1 TABLET (25 MG TOTAL) BY MOUTH DAILY.   lisinopril (ZESTRIL) 40 MG tablet Take 1 tablet (40 mg total) by mouth daily.   metFORMIN (GLUCOPHAGE-XR) 500 MG 24 hr tablet Take 1 tablet (500 mg total) by mouth 2 (two) times daily with a meal.   metoprolol tartrate (LOPRESSOR) 25 MG tablet Take 0.5 tablets (12.5 mg total) by mouth 2 (two) times daily.   MULTIPLE VITAMIN PO Take 1 tablet by mouth daily.   Omega-3 Fatty Acids (FISH OIL) 1200 MG CAPS Take 1,200 mg by mouth daily.   omeprazole (PRILOSEC) 20 MG capsule Take 1 capsule (20 mg total) by mouth daily.   rosuvastatin (CRESTOR) 40 MG tablet Take 1 tablet (40 mg total) by mouth daily.   tirzepatide [facility]) 2.5 MG/0.5ML Pen Inject 2.5 mg into the skin once a week.   zinc gluconate 50 MG tablet Take 50 mg by mouth daily.   No facility-administered medications prior to visit.    Review of Systems  Last CBC Lab Results  Component Value Date   WBC 5.4 11/28/2022   HGB 13.9  11/28/2022   HCT 40.6 11/28/2022   MCV 92.3 11/28/2022   MCH 31.6 11/28/2022   RDW 13.2 11/28/2022   PLT 94 (L) 11/28/2022   Last metabolic panel Lab Results  Component Value Date   GLUCOSE 193 (H) 11/28/2022   NA 137 11/28/2022   K 3.8 11/28/2022   CL 105 11/28/2022   CO2 22 11/28/2022   BUN 35 (H) 11/28/2022   CREATININE 0.94 11/28/2022   GFRNONAA >60 11/28/2022   CALCIUM 9.6 11/28/2022   PROT 6.6 11/25/2021   ALBUMIN 4.4 11/25/2021   LABGLOB 2.2 11/25/2021   AGRATIO 2.0 11/25/2021   BILITOT 0.7 11/25/2021   ALKPHOS 72 11/25/2021   AST 23 11/25/2021   ALT 22 11/25/2021   ANIONGAP 10 11/28/2022   Last lipids Lab Results  Component Value Date   CHOL 151 02/05/2023   HDL 39 (L) 02/05/2023   LDLCALC 82 02/05/2023   TRIG 174 (H) 02/05/2023   CHOLHDL 3.9 02/05/2023   Last hemoglobin A1c Lab Results  Component Value Date   HGBA1C 7.8 (H) 02/05/2023   Last thyroid functions Lab Results  Component Value Date   TSH 1.670 11/25/2021     Objective    BP (!) 131/51   Pulse 65   Temp 98.3 F (36.8 C) (Temporal)   Resp 12   Ht 5\' 5"  (1.651 m)   Wt 225  lb 11.2 oz (102.4 kg)   SpO2 97%   BMI 37.56 kg/m   BP Readings from Last 3 Encounters:  02/07/23 (!) 131/51  01/17/23 (!) 126/56  11/28/22 (!) 120/53   Wt Readings from Last 3 Encounters:  02/07/23 225 lb 11.2 oz (102.4 kg)  01/17/23 226 lb 3.2 oz (102.6 kg)  11/27/22 223 lb (101.2 kg)   SpO2 Readings from Last 3 Encounters:  02/07/23 97%  01/17/23 96%  11/28/22 97%   Physical Exam Vitals and nursing note reviewed.  Constitutional:      General: She is awake. She is not in acute distress.    Appearance: Normal appearance. She is well-developed and well-groomed. She is obese. She is not ill-appearing, toxic-appearing or diaphoretic.  HENT:     Head: Normocephalic and atraumatic.     Jaw: There is normal jaw occlusion. No trismus, tenderness, swelling or pain on movement.     Right Ear: Hearing,  tympanic membrane, ear canal and external ear normal. There is no impacted cerumen.     Left Ear: Hearing, tympanic membrane, ear canal and external ear normal. There is no impacted cerumen.     Ears:     Comments: Use of hearing aides bilateral; no hearing deficit with use    Nose: Nose normal. No congestion or rhinorrhea.     Right Turbinates: Not enlarged, swollen or pale.     Left Turbinates: Not enlarged, swollen or pale.     Right Sinus: No maxillary sinus tenderness or frontal sinus tenderness.     Left Sinus: No maxillary sinus tenderness or frontal sinus tenderness.     Mouth/Throat:     Lips: Pink.     Mouth: Mucous membranes are moist. No injury.     Tongue: No lesions.     Pharynx: Oropharynx is clear. Uvula midline. No pharyngeal swelling, oropharyngeal exudate, posterior oropharyngeal erythema or uvula swelling.     Tonsils: No tonsillar exudate or tonsillar abscesses.  Eyes:     General: Lids are normal. Lids are everted, no foreign bodies appreciated. Vision grossly intact. Gaze aligned appropriately. No allergic shiner or visual field deficit.       Right eye: No discharge.        Left eye: No discharge.     Extraocular Movements: Extraocular movements intact.     Conjunctiva/sclera: Conjunctivae normal.     Right eye: Right conjunctiva is not injected. No exudate.    Left eye: Left conjunctiva is not injected. No exudate.    Pupils: Pupils are equal, round, and reactive to light.  Neck:     Thyroid: No thyroid mass, thyromegaly or thyroid tenderness.     Vascular: No carotid bruit.     Trachea: Trachea normal.  Cardiovascular:     Rate and Rhythm: Normal rate and regular rhythm.     Pulses: Normal pulses.          Carotid pulses are 2+ on the right side and 2+ on the left side.      Radial pulses are 2+ on the right side and 2+ on the left side.       Dorsalis pedis pulses are 2+ on the right side and 2+ on the left side.       Posterior tibial pulses are 2+ on the  right side and 2+ on the left side.     Heart sounds: Normal heart sounds, S1 normal and S2 normal. No murmur heard.    No friction rub.  No gallop.  Pulmonary:     Effort: Pulmonary effort is normal. No respiratory distress.     Breath sounds: Normal breath sounds and air entry. No stridor. No wheezing, rhonchi or rales.  Chest:     Chest wall: No tenderness.  Abdominal:     General: Abdomen is flat. Bowel sounds are normal. There is no distension.     Palpations: Abdomen is soft. There is no mass.     Tenderness: There is no abdominal tenderness. There is no right CVA tenderness, left CVA tenderness, guarding or rebound.     Hernia: No hernia is present.  Genitourinary:    Comments: Exam deferred; c/o bladder prolapse and incontinence  Musculoskeletal:        General: No swelling, tenderness, deformity or signs of injury. Normal range of motion.     Cervical back: Full passive range of motion without pain, normal range of motion and neck supple. No edema, rigidity or tenderness. No muscular tenderness.     Right lower leg: No edema.     Left lower leg: No edema.  Lymphadenopathy:     Cervical: No cervical adenopathy.     Right cervical: No superficial, deep or posterior cervical adenopathy.    Left cervical: No superficial, deep or posterior cervical adenopathy.  Skin:    General: Skin is warm and dry.     Capillary Refill: Capillary refill takes less than 2 seconds.     Coloration: Skin is not jaundiced or pale.     Findings: No bruising, erythema, lesion or rash.  Neurological:     General: No focal deficit present.     Mental Status: She is alert and oriented to person, place, and time. Mental status is at baseline.     GCS: GCS eye subscore is 4. GCS verbal subscore is 5. GCS motor subscore is 6.     Sensory: Sensation is intact. No sensory deficit.     Motor: Motor function is intact. No weakness.     Coordination: Coordination is intact. Coordination normal.     Gait: Gait  is intact. Gait normal.  Psychiatric:        Attention and Perception: Attention and perception normal.        Mood and Affect: Mood and affect normal.        Speech: Speech normal.        Behavior: Behavior normal. Behavior is cooperative.        Thought Content: Thought content normal.        Cognition and Memory: Cognition and memory normal.        Judgment: Judgment normal.     No results found for any visits on 02/07/23.  Assessment & Plan     Problem List Items Addressed This Visit       Cardiovascular and Mediastinum   Hypertension associated with diabetes (HCC)    Chronic, borderline Goal 129/79 Continue hctz 25, lisinopril 40      Paroxysmal atrial fibrillation (HCC)    Chronic, stable On 12.5 metop BIC On ASA 81 mg        Endocrine   Hyperlipidemia associated with type 2 diabetes mellitus (HCC)    Chronic, elevated Continue diet/exercise and crestor 40 mg  Ldl goal 55-70      Type 2 diabetes mellitus with hyperglycemia, without long-term current use of insulin (HCC)    Chronic, worsening Discuss start of mounjaro to assist Continue farxia 10 and 500 mg metformin bid Continue to  recommend balanced, lower carb meals. Smaller meal size, adding snacks. Choosing water as drink of choice and increasing purposeful exercise.         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 Associated with HTN, HLD, DM       Personal history of diabetic foot ulcer    Denies DM foot exam  today Continue to monitor daily      Post-menopausal - Primary    Due for repeat DEXA; walks for exercise. 90 mins/week currently       Relevant Orders   DG Bone Density   Return in about 3 months (around 05/10/2023) for T2DM management- start of monjaro .     Leilani Merl, FNP, have reviewed all documentation for this visit. The documentation on 02/07/23 for the exam, diagnosis, procedures, and orders are all accurate and complete.  Jacky Kindle, FNP  [facility] [phone] (phone) [phone] (fax)  [facility]

## 2023-02-06 NOTE — Progress Notes (Signed)
Cholesterol is slightly improved; The 10-year ASCVD risk score (Arnett DK, et al., 2019) is: 42.8% I continue to recommend LDL between 50-70 for pt's with DM. I continue to recommend diet low in saturated fat and regular exercise - 30 min at least 5 times per week  A1c has increased slightly; now 7.8%. Continue to recommend balanced, lower carb meals. Smaller meal size, adding snacks. Choosing water as drink of choice and increasing purposeful exercise.  Urine pending.  Recommend start of injectable and statin increase to assist lifestyle mgmt.

## 2023-02-07 ENCOUNTER — Encounter: Payer: Self-pay | Admitting: Family Medicine

## 2023-02-07 ENCOUNTER — Ambulatory Visit (INDEPENDENT_AMBULATORY_CARE_PROVIDER_SITE_OTHER): Payer: Medicare Other | Admitting: Family Medicine

## 2023-02-07 VITALS — BP 131/51 | HR 65 | Temp 98.3°F | Resp 12 | Ht 65.0 in | Wt 225.7 lb

## 2023-02-07 DIAGNOSIS — Z780 Asymptomatic menopausal state: Secondary | ICD-10-CM

## 2023-02-07 DIAGNOSIS — D473 Essential (hemorrhagic) thrombocythemia: Secondary | ICD-10-CM

## 2023-02-07 DIAGNOSIS — N8110 Cystocele, unspecified: Secondary | ICD-10-CM | POA: Insufficient documentation

## 2023-02-07 DIAGNOSIS — E1165 Type 2 diabetes mellitus with hyperglycemia: Secondary | ICD-10-CM

## 2023-02-07 DIAGNOSIS — I480 Paroxysmal atrial fibrillation: Secondary | ICD-10-CM

## 2023-02-07 DIAGNOSIS — R3981 Functional urinary incontinence: Secondary | ICD-10-CM

## 2023-02-07 DIAGNOSIS — Z8631 Personal history of diabetic foot ulcer: Secondary | ICD-10-CM

## 2023-02-07 DIAGNOSIS — E1169 Type 2 diabetes mellitus with other specified complication: Secondary | ICD-10-CM

## 2023-02-07 DIAGNOSIS — E785 Hyperlipidemia, unspecified: Secondary | ICD-10-CM

## 2023-02-07 DIAGNOSIS — I152 Hypertension secondary to endocrine disorders: Secondary | ICD-10-CM

## 2023-02-07 DIAGNOSIS — E1159 Type 2 diabetes mellitus with other circulatory complications: Secondary | ICD-10-CM

## 2023-02-07 NOTE — Addendum Note (Signed)
Addended by: Merita Norton T on: 02/07/2023 12:28 PM   Modules accepted: Orders, Level of Service

## 2023-02-07 NOTE — Assessment & Plan Note (Signed)
Chronic, stable On 12.5 metop BIC On ASA 81 mg

## 2023-02-07 NOTE — Assessment & Plan Note (Signed)
Chronic, stable No abnormal bruising or bleeding

## 2023-02-07 NOTE — Assessment & Plan Note (Signed)
Chronic, borderline Goal 129/79 Continue hctz 25, lisinopril 40

## 2023-02-07 NOTE — Assessment & Plan Note (Signed)
Chronic, stable Body mass index is 37.56 kg/m. Associated with HTN, HLD, DM

## 2023-02-07 NOTE — Assessment & Plan Note (Signed)
Chronic, elevated Continue diet/exercise and crestor 40 mg  Ldl goal 55-70

## 2023-02-07 NOTE — Assessment & Plan Note (Signed)
Acute on chronic, referral placed to assist

## 2023-02-07 NOTE — Assessment & Plan Note (Signed)
UTD on vision, dental Things to do to keep yourself healthy  - Exercise at least 30-45 minutes a day, 3-4 days a week.  - Eat a low-fat diet with lots of fruits and vegetables, up to 7-9 servings per day.  - Seatbelts can save your life. Wear them always.  - Smoke detectors on every level of your home, check batteries every year.  - Eye Doctor - have an eye exam every 1-2 years  - Safe sex - if you may be exposed to STDs, use a condom.  - Alcohol -  If you drink, do it moderately, less than 2 drinks per day.  - Health Care Power of Attorney. Choose someone to speak for you if you are not able.  - Depression is common in our stressful world.If you're feeling down or losing interest in things you normally enjoy, please come in for a visit.  - Violence - If anyone is threatening or hurting you, please call immediately.

## 2023-02-07 NOTE — Assessment & Plan Note (Signed)
Denies DM foot exam today Continue to monitor daily

## 2023-02-07 NOTE — Assessment & Plan Note (Signed)
Chronic, worsening Discuss start of mounjaro to assist Continue farxia 10 and 500 mg metformin bid Continue to recommend balanced, lower carb meals. Smaller meal size, adding snacks. Choosing water as drink of choice and increasing purposeful exercise.

## 2023-02-07 NOTE — Assessment & Plan Note (Signed)
Due for repeat DEXA; walks for exercise. 90 mins/week currently

## 2023-02-13 ENCOUNTER — Encounter: Payer: Self-pay | Admitting: Family Medicine

## 2023-02-16 ENCOUNTER — Telehealth: Payer: Self-pay

## 2023-02-16 ENCOUNTER — Other Ambulatory Visit: Payer: Self-pay | Admitting: Family Medicine

## 2023-02-16 ENCOUNTER — Encounter: Payer: Self-pay | Admitting: Family Medicine

## 2023-02-16 ENCOUNTER — Ambulatory Visit: Payer: Self-pay | Admitting: *Deleted

## 2023-02-16 MED ORDER — CIPROFLOXACIN HCL 500 MG PO TABS: 500.0000 mg | ORAL_TABLET | Freq: Two times a day (BID) | ORAL | 0 refills | Status: AC

## 2023-02-16 NOTE — Telephone Encounter (Signed)
Copied from CRM [id number]. Topic: General - Other >> Feb 16, 2023 12:29 PM Franchot Heidelberg wrote: Reason for CRM: Pt called because she has not yet heard from her PCP. Pt sent a mychart message this morning, please advise. Still having symptoms, waiting for return call from PCP.   Best contact: [phone]

## 2023-02-16 NOTE — Telephone Encounter (Signed)
Chief Complaint: diarrhea  Symptoms: started having diarrhea watery and loose since last night. Has been more than 6-8 times today possibly more. No abdominal pain but "just doesn't feel right". Reports she can not leave home due to multiple times of diarrhea. A lot of gas and burping reported. Blood glucose 142. Has taken 2nd dose of mounjaro and not sure if causing sx. Has taken imodium 3 times today with no relief.  Frequency: last night 11 pm Pertinent Negatives: Patient denies fever no vomiting. No dizziness no dry mouth . Has eaten biscuits this am but no more than that.  Disposition: [] ED /[x] Urgent Care (no appt availability in office) / [] Appointment(In office/virtual)/ []  [facility]/ [] Home Care/ [] Refused Recommended Disposition /[] [facility]/ []  Follow-up with PCP Additional Notes:   Recommended UC and patient reports she can not leave her house. Requesting response to my chart message. Care advise given. Please advise.      Reason for Disposition  [1] SEVERE diarrhea (e.g., 7 or more times / day more than normal) AND [2] age > 60 years  Answer Assessment - Initial Assessment Questions 1. DIARRHEA SEVERITY: "How bad is the diarrhea?" "How many more stools have you had in the past 24 hours than normal?"    - NO DIARRHEA (SCALE 0)   - MILD (SCALE 1-3): Few loose or mushy BMs; increase of 1-3 stools over normal daily number of stools; mild increase in ostomy output.   -  MODERATE (SCALE 4-7): Increase of 4-6 stools daily over normal; moderate increase in ostomy output.   -  SEVERE (SCALE 8-10; OR "WORST POSSIBLE"): Increase of 7 or more stools daily over normal; moderate increase in ostomy output; incontinence.     More than 6- 8 times  2. ONSET: "When did the diarrhea begin?"      Last night after 11 pm  3. BM CONSISTENCY: "How loose or watery is the diarrhea?"      Both loose and watery  4. VOMITING: "Are you also vomiting?" If Yes, ask: "How many times  in the past 24 hours?"      No  5. ABDOMEN PAIN: "Are you having any abdomen pain?" If Yes, ask: "What does it feel like?" (e.g., crampy, dull, intermittent, constant)      Just doesn't feel right  6. ABDOMEN PAIN SEVERITY: If present, ask: "How bad is the pain?"  (e.g., Scale 1-10; mild, moderate, or severe)   - MILD (1-3): doesn't interfere with normal activities, abdomen soft and not tender to touch    - MODERATE (4-7): interferes with normal activities or awakens from sleep, abdomen tender to touch    - SEVERE (8-10): excruciating pain, doubled over, unable to do any normal activities       Unable to sleep  7. ORAL INTAKE: If vomiting, "Have you been able to drink liquids?" "How much liquids have you had in the past 24 hours?"     Water and coke zero  8. HYDRATION: "Any signs of dehydration?" (e.g., dry mouth [not just dry lips], too weak to stand, dizziness, new weight loss) "When did you last urinate?"     No  9. EXPOSURE: "Have you traveled to a foreign country recently?" "Have you been exposed to anyone with diarrhea?" "Could you have eaten any food that was spoiled?"     Pimento cheese sandwich and chips  10. ANTIBIOTIC USE: "Are you taking antibiotics now or have you taken antibiotics in the past 2 months?"  na 11. OTHER SYMPTOMS: "Do you have any other symptoms?" (e.g., fever, blood in stool)       A lot of gas and  12. PREGNANCY: "Is there any chance you are pregnant?" "When was your last menstrual period?"       na  Protocols used: [facility]

## 2023-02-18 ENCOUNTER — Encounter: Payer: Self-pay | Admitting: Family Medicine

## 2023-02-19 ENCOUNTER — Observation Stay
Admission: EM | Admit: 2023-02-19 | Discharge: 2023-02-20 | Disposition: A | Discharge status: Home / Self Care | Payer: Medicare Other | Attending: Internal Medicine | Admitting: Internal Medicine

## 2023-02-19 ENCOUNTER — Emergency Department: Payer: Medicare Other

## 2023-02-19 ENCOUNTER — Other Ambulatory Visit: Payer: Self-pay

## 2023-02-19 DIAGNOSIS — Z7984 Long term (current) use of oral hypoglycemic drugs: Secondary | ICD-10-CM | POA: Diagnosis not present

## 2023-02-19 DIAGNOSIS — E860 Dehydration: Secondary | ICD-10-CM | POA: Diagnosis not present

## 2023-02-19 DIAGNOSIS — I10 Essential (primary) hypertension: Secondary | ICD-10-CM | POA: Diagnosis not present

## 2023-02-19 DIAGNOSIS — Z7982 Long term (current) use of aspirin: Secondary | ICD-10-CM | POA: Diagnosis not present

## 2023-02-19 DIAGNOSIS — E119 Type 2 diabetes mellitus without complications: Secondary | ICD-10-CM | POA: Diagnosis not present

## 2023-02-19 DIAGNOSIS — E1159 Type 2 diabetes mellitus with other circulatory complications: Secondary | ICD-10-CM

## 2023-02-19 DIAGNOSIS — K529 Noninfective gastroenteritis and colitis, unspecified: Principal | ICD-10-CM | POA: Diagnosis present

## 2023-02-19 DIAGNOSIS — Z89421 Acquired absence of other right toe(s): Secondary | ICD-10-CM | POA: Diagnosis not present

## 2023-02-19 DIAGNOSIS — Z794 Long term (current) use of insulin: Secondary | ICD-10-CM | POA: Diagnosis not present

## 2023-02-19 DIAGNOSIS — R112 Nausea with vomiting, unspecified: Secondary | ICD-10-CM | POA: Diagnosis present

## 2023-02-19 DIAGNOSIS — Z79899 Other long term (current) drug therapy: Secondary | ICD-10-CM | POA: Diagnosis not present

## 2023-02-19 DIAGNOSIS — Z85828 Personal history of other malignant neoplasm of skin: Secondary | ICD-10-CM | POA: Insufficient documentation

## 2023-02-19 DIAGNOSIS — I4891 Unspecified atrial fibrillation: Secondary | ICD-10-CM | POA: Insufficient documentation

## 2023-02-19 LAB — CBC WITH DIFFERENTIAL/PLATELET
Abs Immature Granulocytes: 0.02 10*3/uL (ref 0.00–0.07)
Basophils Absolute: 0 10*3/uL (ref 0.0–0.1)
Basophils Relative: 0 %
Eosinophils Absolute: 0.1 10*3/uL (ref 0.0–0.5)
Eosinophils Relative: 1 %
HCT: 44.7 % (ref 36.0–46.0)
Hemoglobin: 15.1 g/dL — ABNORMAL HIGH (ref 12.0–15.0)
Immature Granulocytes: 0 %
Lymphocytes Relative: 29 %
Lymphs Abs: 1.8 10*3/uL (ref 0.7–4.0)
MCH: 32.3 pg (ref 26.0–34.0)
MCHC: 33.8 g/dL (ref 30.0–36.0)
MCV: 95.5 fL (ref 80.0–100.0)
Monocytes Absolute: 0.7 10*3/uL (ref 0.1–1.0)
Monocytes Relative: 11 %
Neutro Abs: 3.7 10*3/uL (ref 1.7–7.7)
Neutrophils Relative %: 59 %
Platelets: 146 10*3/uL — ABNORMAL LOW (ref 150–400)
RBC: 4.68 MIL/uL (ref 3.87–5.11)
RDW: 12.8 % (ref 11.5–15.5)
WBC: 6.3 10*3/uL (ref 4.0–10.5)
nRBC: 0 % (ref 0.0–0.2)

## 2023-02-19 LAB — COMPREHENSIVE METABOLIC PANEL
ALT: 17 U/L (ref 0–44)
AST: 17 U/L (ref 15–41)
Albumin: 3.7 g/dL (ref 3.5–5.0)
Alkaline Phosphatase: 48 U/L (ref 38–126)
Anion gap: 11 (ref 5–15)
BUN: 58 mg/dL — ABNORMAL HIGH (ref 8–23)
CO2: 21 mmol/L — ABNORMAL LOW (ref 22–32)
Calcium: 9.4 mg/dL (ref 8.9–10.3)
Chloride: 104 mmol/L (ref 98–111)
Creatinine, Ser: 1.6 mg/dL — ABNORMAL HIGH (ref 0.44–1.00)
GFR, Estimated: 33 mL/min — ABNORMAL LOW (ref >60)
Glucose, Bld: 213 mg/dL — ABNORMAL HIGH (ref 70–99)
Potassium: 4 mmol/L (ref 3.5–5.1)
Sodium: 136 mmol/L (ref 135–145)
Total Bilirubin: 1.1 mg/dL (ref 0.3–1.2)
Total Protein: 6.8 g/dL (ref 6.5–8.1)

## 2023-02-19 LAB — RESPIRATORY PANEL BY PCR

## 2023-02-19 LAB — URINALYSIS, ROUTINE W REFLEX MICROSCOPIC
Bacteria, UA: NONE SEEN
Bilirubin Urine: NEGATIVE
Glucose, UA: 150 mg/dL — AB
Hgb urine dipstick: NEGATIVE
Ketones, ur: NEGATIVE mg/dL
Leukocytes,Ua: NEGATIVE
Nitrite: NEGATIVE
Protein, ur: NEGATIVE mg/dL
Specific Gravity, Urine: 1.013 (ref 1.005–1.030)
pH: 5 (ref 5.0–8.0)

## 2023-02-19 LAB — CBG MONITORING, ED: Glucose-Capillary: 109 mg/dL — ABNORMAL HIGH (ref 70–99)

## 2023-02-19 LAB — LIPASE, BLOOD: Lipase: 36 U/L (ref 11–51)

## 2023-02-19 MED ORDER — LACTATED RINGERS IV BOLUS
1000.0000 mL | Freq: Once | INTRAVENOUS | Status: AC
  Administered 2023-02-19: 1000 mL via INTRAVENOUS

## 2023-02-19 MED ORDER — HEPARIN SODIUM (PORCINE) 5000 UNIT/ML IJ SOLN
5000.0000 [IU] | Freq: Three times a day (TID) | INTRAMUSCULAR | Status: DC
  Administered 2023-02-19 – 2023-02-20 (×2): 5000 [IU] via SUBCUTANEOUS
  Filled 2023-02-19 (×2): qty 1

## 2023-02-19 MED ORDER — ACETAMINOPHEN 325 MG PO TABS: 650.0000 mg | ORAL_TABLET | Freq: Four times a day (QID) | ORAL | Status: DC | PRN

## 2023-02-19 MED ORDER — PANTOPRAZOLE SODIUM 40 MG PO TBEC
40.0000 mg | DELAYED_RELEASE_TABLET | Freq: Every day | ORAL | Status: DC
  Administered 2023-02-19 – 2023-02-20 (×2): 40 mg via ORAL
  Filled 2023-02-19 (×2): qty 1

## 2023-02-19 MED ORDER — ONDANSETRON HCL 4 MG PO TABS: 4.0000 mg | ORAL_TABLET | Freq: Four times a day (QID) | ORAL | Status: DC | PRN

## 2023-02-19 MED ORDER — ONDANSETRON HCL 4 MG/2ML IJ SOLN
4.0000 mg | Freq: Once | INTRAMUSCULAR | Status: AC
  Administered 2023-02-19: 4 mg via INTRAVENOUS
  Filled 2023-02-19: qty 2

## 2023-02-19 MED ORDER — PANTOPRAZOLE SODIUM 40 MG IV SOLR
40.0000 mg | Freq: Once | INTRAVENOUS | Status: AC
  Administered 2023-02-19: 40 mg via INTRAVENOUS
  Filled 2023-02-19: qty 10

## 2023-02-19 MED ORDER — ACETAMINOPHEN 650 MG RE SUPP: 650.0000 mg | Freq: Four times a day (QID) | RECTAL | Status: DC | PRN

## 2023-02-19 MED ORDER — SODIUM CHLORIDE 0.9 % IV BOLUS
1000.0000 mL | Freq: Once | INTRAVENOUS | Status: AC
  Administered 2023-02-19: 1000 mL via INTRAVENOUS

## 2023-02-19 MED ORDER — SODIUM CHLORIDE 0.9 % IV SOLN: INTRAVENOUS | Status: DC

## 2023-02-19 MED ORDER — ASPIRIN 81 MG PO TBEC
81.0000 mg | DELAYED_RELEASE_TABLET | Freq: Every day | ORAL | Status: DC
  Administered 2023-02-19 – 2023-02-20 (×2): 81 mg via ORAL
  Filled 2023-02-19 (×2): qty 1

## 2023-02-19 MED ORDER — ONDANSETRON HCL 4 MG/2ML IJ SOLN: 4.0000 mg | Freq: Four times a day (QID) | INTRAMUSCULAR | Status: DC | PRN

## 2023-02-19 MED ORDER — INSULIN ASPART 100 UNIT/ML IJ SOLN: 0.0000 [IU] | Freq: Three times a day (TID) | INTRAMUSCULAR | Status: DC

## 2023-02-19 MED ORDER — ALBUTEROL SULFATE (2.5 MG/3ML) 0.083% IN NEBU: 2.5000 mg | INHALATION_SOLUTION | RESPIRATORY_TRACT | Status: DC | PRN

## 2023-02-19 NOTE — Discharge Instructions (Addendum)
Keep log of his sugars at home and discussed with primary care.

## 2023-02-19 NOTE — Telephone Encounter (Signed)
Patient returned call and said she is in the hospital

## 2023-02-19 NOTE — ED Triage Notes (Signed)
Pt has been having vomiting and diarrhea for a few days, she was started on an ABX, took one pill and vomited about 5 mins later. Sts that she spoke with her daughter and was told it was a strong ABX and she didn't know why she was prescribed it as she wanted something for diarrhea. Pt sts that she is still vomiting and having diarrhea.

## 2023-02-19 NOTE — ED Provider Notes (Signed)
[facility] Provider Note    Event Date/Time   First MD Initiated Contact with Patient 02/19/23 0451     (approximate)   History   Chief Complaint: Emesis and Diarrhea   HPI  Stacie Porter is a 77 y.o. female with a history of hypertension, paroxysmal atrial fibrillation, diabetes who comes ED complaining of vomiting and diarrhea for the past 3 days.  The day prior to the onset of symptoms, she had received her second Mounjaro injection for therapy initiation.  Denies fever chills body aches or fatigue.  No chest pain or shortness of breath.  No black or bloody stool.     Physical Exam   Triage Vital Signs: ED Triage Vitals  Enc Vitals Group     BP 02/19/23 0358 (!) 106/56     Pulse Rate 02/19/23 0358 94     Resp 02/19/23 0358 16     Temp 02/19/23 0358 98.5 F (36.9 C)     Temp src --      SpO2 02/19/23 0358 95 %     Weight 02/19/23 0356 218 lb 4.1 oz (99 kg)     Height 02/19/23 0356 5\' 5"  (1.651 m)     Head Circumference --      Peak Flow --      Pain Score --      Pain Loc --      Pain Edu? --      Excl. in GC? --     Most recent vital signs: Vitals:   02/19/23 0358  BP: (!) 106/56  Pulse: 94  Resp: 16  Temp: 98.5 F (36.9 C)  SpO2: 95%    General: Awake, no distress.  CV:  Good peripheral perfusion.  Regular rate and rhythm Resp:  Normal effort.  Clear to auscultation bilaterally Abd:  No distention.  Soft nontender Other:  Somewhat dry mucous membranes.   ED Results / Procedures / Treatments   Labs (all labs ordered are listed, but only abnormal results are displayed) Labs Reviewed  CBC WITH DIFFERENTIAL/PLATELET - Abnormal; Notable for the following components:      Result Value   Hemoglobin 15.1 (*)    Platelets 146 (*)    All other components within normal limits  COMPREHENSIVE METABOLIC PANEL - Abnormal; Notable for the following components:   CO2 21 (*)    Glucose, Bld 213 (*)    BUN 58 (*)    Creatinine, Ser  1.60 (*)    GFR, Estimated 33 (*)    All other components within normal limits  LIPASE, BLOOD  URINALYSIS, ROUTINE W REFLEX MICROSCOPIC     EKG    RADIOLOGY    PROCEDURES:  Procedures   MEDICATIONS ORDERED IN ED: Medications  lactated ringers bolus 1,000 mL (0 mLs Intravenous Stopped 02/19/23 0645)  ondansetron (ZOFRAN) injection 4 mg (4 mg Intravenous Given 02/19/23 0530)  pantoprazole (PROTONIX) injection 40 mg (40 mg Intravenous Given 02/19/23 0530)  lactated ringers bolus 1,000 mL (1,000 mLs Intravenous New Bag/Given 02/19/23 0645)     IMPRESSION / MDM / ASSESSMENT AND PLAN / ED COURSE  I reviewed the triage vital signs and the nursing notes.  DDx: Dehydration, AKI, electrolyte abnormality, anemia, gastritis, pancreatitis  Patient's presentation is most consistent with acute presentation with potential threat to life or bodily function.  Patient presents with vomiting diarrhea, unable to tolerate oral intake for the last few days.  Suspect side effect of Mounjaro.  Abdomen is soft  and benign.  She is not having severe abdominal pain to suggest vascular issue such as AAA dissection or mesenteric ischemia. Considering the patient's symptoms, medical history, and physical examination today, I have low suspicion for cholecystitis or biliary pathology, pancreatitis, perforation or bowel obstruction, hernia, intra-abdominal abscess, AAA or dissection, volvulus or intussusception, mesenteric ischemia, or appendicitis.  Doubt C. difficile colitis  Labs do show mild renal insufficiency with elevated BUN.  CBC consistent with hemoconcentration.  Will give IV fluids, Zofran, Protonix for symptom relief.  Clinical Course as of 02/19/23 0656  Mon Feb 19, 2023  9604 Blood pressure borderline, about 90/55.  Will give additional liter of fluids and reassess. [PS]    Clinical Course User Index [PS] Sharman Cheek, MD     FINAL CLINICAL IMPRESSION(S) / ED DIAGNOSES   Final  diagnoses:  Nausea vomiting and diarrhea  Dehydration     Rx / DC Orders   ED Discharge Orders     None        Note:  This document was prepared using Dragon voice recognition software and may include unintentional dictation errors.   Sharman Cheek, MD 02/19/23 [phone]

## 2023-02-19 NOTE — ED Notes (Signed)
Patient transported to CT 

## 2023-02-19 NOTE — ED Notes (Signed)
Pt ambulatory to bathroom independently with steady gait.

## 2023-02-19 NOTE — ED Notes (Signed)
Patient blood pressure has trended downward.  Dr. Scotty Court has been made aware, and he is ordering additional fluids.  Patient has no complaints with regard to hypotensive symptoms.

## 2023-02-19 NOTE — H&P (Addendum)
History and Physical    Stacie Porter AOZ:[id number] DOB: 1945-10-14 DOA: 02/19/2023  PCP: Jacky Kindle, FNP  Patient coming from: home  I have personally briefly reviewed patient's old medical records in [facility]  Chief Complaint:  n/v/d  HPI: Stacie Porter is a 77 y.o. female with medical history significant of DMII recently started on Mounjaro 6/20,Afib, GERD, HLD, HTN,  OSA cpap , who presents to ED with with 5 days of watery diarrhea and loose stools . Patient states over the last few days she has also started having nausea and vomiting.  She was in contact with her pcp and was prescribed antibiotic. She states she was not able to take antibiotic and due to persistent symptoms presented to ED.  Patient notes no fever/chills/ chest pain / sob/ or black stools or blood in stools with these symptoms.  ED Course:  Afeb bp 106/56 -84/47 (101/42), hr 94 rr 16 sat 95%  Labs  Wbc: 6.3, hgb 15.1, plt 146,  Na 136, K 4, cL 104, co2 21  Cr 1.6 (0.94) Lipase 36 UA negative Tx protonix 40mg  iv,zofran 4mg  , LR 1L  Review of Systems: As per HPI otherwise 10 point review of systems negative.   Past Medical History:  Diagnosis Date   Cataract    Diabetes mellitus without complication (HCC)    type 2   Dysrhythmia    a fib   GERD (gastroesophageal reflux disease)    Hx of basal cell carcinoma ~2014 Mohs   Nose   Hyperlipidemia    Hypertension    Osteomyelitis of second toe of right foot (HCC) 11/11/2018   Sleep apnea    cpap    Past Surgical History:  Procedure Laterality Date   AMPUTATION TOE Right 11/11/2018   Procedure: AMPUTATION RIGHT 2ND TOE;  Surgeon: Jodi Geralds, MD;  Location: [facility];  Service: Orthopedics;  Laterality: Right;   APPENDECTOMY     CARDIAC ELECTROPHYSIOLOGY STUDY AND ABLATION     cather ablation  2011/2014   cardiac ablation for a fib   COLONOSCOPY WITH PROPOFOL N/A 02/11/2021   Procedure: COLONOSCOPY WITH PROPOFOL;  Surgeon: Pasty Spillers, MD;  Location: [facility] ENDOSCOPY;  Service: Endoscopy;  Laterality: N/A;   EYE SURGERY Bilateral 05/2014   cataract   HAMMER TOE SURGERY Right    HAND SURGERY Bilateral    LAPAROSCOPIC OOPHERECTOMY     recession of gums sx     TUBAL LIGATION       reports that she has never smoked. She has never used smokeless tobacco. She reports that she does not drink alcohol and does not use drugs.  Allergies  Allergen Reactions   Multaq [Dronedarone]     Unknown reaction     Family History  Problem Relation Age of Onset   Ovarian cancer Sister    Cancer Sister        ovarian   Lung cancer Sister    Cancer Sister        lung and thyroid   Thyroid cancer Sister    Cancer Sister    Heart disease Mother    Heart disease Father    Hypertension Father    Diabetes Maternal Uncle    Diabetes Maternal Uncle    Breast cancer Neg Hx     Prior to Admission medications   Medication Sig Start Date End Date Taking? Authorizing Provider  aspirin (ASPIRIN LOW DOSE) 81 MG EC tablet Take 1 tablet (81  mg total) by mouth daily. SWALLOW WHOLE. 10/19/21   Jacky Kindle, FNP  Calcium Carbonate-Vitamin D 600-200 MG-UNIT CAPS Take 1 tablet by mouth daily. 01/18/07   [provider]  ciprofloxacin (CIPRO) 500 MG tablet Take 1 tablet (500 mg total) by mouth 2 (two) times daily for 3 days. 02/16/23 02/19/23  Jacky Kindle, FNP  Continuous Blood Gluc Sensor (FREESTYLE LIBRE 2 SENSOR) MISC 1 each by Does not apply route every 14 (fourteen) days. 04/06/22   Jacky Kindle, FNP  dapagliflozin propanediol (FARXIGA) 10 MG TABS tablet Take 1 tablet (10 mg total) by mouth daily before breakfast. 08/03/22   Jacky Kindle, FNP  hydrochlorothiazide (HYDRODIURIL) 25 MG tablet TAKE 1 TABLET (25 MG TOTAL) BY MOUTH DAILY. 12/04/22   Jacky Kindle, FNP  lisinopril (ZESTRIL) 40 MG tablet Take 1 tablet (40 mg total) by mouth daily. 08/03/22 07/29/23  Jacky Kindle, FNP  metFORMIN (GLUCOPHAGE-XR) 500 MG 24 hr tablet Take 1  tablet (500 mg total) by mouth 2 (two) times daily with a meal. 08/03/22   Jacky Kindle, FNP  metoprolol tartrate (LOPRESSOR) 25 MG tablet Take 0.5 tablets (12.5 mg total) by mouth 2 (two) times daily. 08/03/22   Jacky Kindle, FNP  MULTIPLE VITAMIN PO Take 1 tablet by mouth daily. 01/18/07   [provider]  Omega-3 Fatty Acids (FISH OIL) 1200 MG CAPS Take 1,200 mg by mouth daily.    [provider]  omeprazole (PRILOSEC) 20 MG capsule Take 1 capsule (20 mg total) by mouth daily. 08/03/22   Jacky Kindle, FNP  rosuvastatin (CRESTOR) 40 MG tablet Take 1 tablet (40 mg total) by mouth daily. 02/06/23   Jacky Kindle, FNP  tirzepatide [facility]) 2.5 MG/0.5ML Pen Inject 2.5 mg into the skin once a week. 02/06/23   Jacky Kindle, FNP  zinc gluconate 50 MG tablet Take 50 mg by mouth daily.    [provider]    Physical Exam: Vitals:   02/19/23 0737 02/19/23 0738 02/19/23 1010 02/19/23 1236  BP: (!) 97/46 (!) 86/48 (!) 101/42 (!) 113/44  Pulse: 72 80 70 65  Resp: (!) 22 (!) 25 20 17   Temp:      TempSrc:      SpO2: 97% 99% 98% 98%  Weight:      Height:        Constitutional: NAD, calm, comfortable Vitals:   02/19/23 0737 02/19/23 0738 02/19/23 1010 02/19/23 1236  BP: (!) 97/46 (!) 86/48 (!) 101/42 (!) 113/44  Pulse: 72 80 70 65  Resp: (!) 22 (!) 25 20 17   Temp:      TempSrc:      SpO2: 97% 99% 98% 98%  Weight:      Height:       Eyes: PERRL, lids and conjunctivae normal ENMT: Mucous membranes are moist. Posterior pharynx clear of any exudate or lesions.Normal dentition.  Neck: normal, supple, no masses, no thyromegaly Respiratory: clear to auscultation bilaterally, no wheezing, no crackles. Normal respiratory effort. No accessory muscle use.  Cardiovascular: Regular rate and rhythm, no murmurs / rubs / gallops. No extremity edema. 2+ pedal pulses.   Abdomen: no tenderness, no masses palpated. No hepatosplenomegaly. Bowel sounds positive.  Musculoskeletal:  no clubbing / cyanosis. No joint deformity upper and lower extremities. Good ROM, no contractures. Normal muscle tone.  Skin: no rashes, lesions, ulcers. No induration Neurologic: CN 2-12 grossly intact. Sensation intact, Strength 5/5 in all 4.  Psychiatric: Normal  judgment and insight. Alert and oriented x 3. Normal mood.    Labs on Admission: I have personally reviewed following labs and imaging studies  CBC: Recent Labs  Lab 02/19/23 0400  WBC 6.3  NEUTROABS 3.7  HGB 15.1*  HCT 44.7  MCV 95.5  PLT 146*   Basic Metabolic Panel: Recent Labs  Lab 02/19/23 0400  NA 136  K 4.0  CL 104  CO2 21*  GLUCOSE 213*  BUN 58*  CREATININE 1.60*  CALCIUM 9.4   GFR: Estimated Creatinine Clearance: 34.9 mL/min (A) (by C-G formula based on SCr of 1.6 mg/dL (H)). Liver Function Tests: Recent Labs  Lab 02/19/23 0400  AST 17  ALT 17  ALKPHOS 48  BILITOT 1.1  PROT 6.8  ALBUMIN 3.7   Recent Labs  Lab 02/19/23 0400  LIPASE 36   No results for input(s): "AMMONIA" in the last 168 hours. Coagulation Profile: No results for input(s): "INR", "PROTIME" in the last 168 hours. Cardiac Enzymes: No results for input(s): "CKTOTAL", "CKMB", "CKMBINDEX", "TROPONINI" in the last 168 hours. BNP (last 3 results) No results for input(s): "PROBNP" in the last 8760 hours. HbA1C: No results for input(s): "HGBA1C" in the last 72 hours. CBG: No results for input(s): "GLUCAP" in the last 168 hours. Lipid Profile: No results for input(s): "CHOL", "HDL", "LDLCALC", "TRIG", "CHOLHDL", "LDLDIRECT" in the last 72 hours. Thyroid Function Tests: No results for input(s): "TSH", "T4TOTAL", "FREET4", "T3FREE", "THYROIDAB" in the last 72 hours. Anemia Panel: No results for input(s): "VITAMINB12", "FOLATE", "FERRITIN", "TIBC", "IRON", "RETICCTPCT" in the last 72 hours. Urine analysis:    Component Value Date/Time   COLORURINE YELLOW (A) 02/19/2023 0750   APPEARANCEUR CLOUDY (A) 02/19/2023 0750    LABSPEC 1.013 02/19/2023 0750   PHURINE 5.0 02/19/2023 0750   GLUCOSEU 150 (A) 02/19/2023 0750   HGBUR NEGATIVE 02/19/2023 0750   BILIRUBINUR NEGATIVE 02/19/2023 0750   BILIRUBINUR Neg 09/20/2018 1135   KETONESUR NEGATIVE 02/19/2023 0750   PROTEINUR NEGATIVE 02/19/2023 0750   UROBILINOGEN 0.2 09/20/2018 1135   NITRITE NEGATIVE 02/19/2023 0750   LEUKOCYTESUR NEGATIVE 02/19/2023 0750    Radiological Exams on Admission: No results found.  EKG: Independently reviewed.   Assessment/Plan  Acute gastroenteritis nos / possible viral vs bacterial origin  - presumed medication side-effect vs possible viral/ bacterial  --will check full respiratory viral panel as presenting symptoms may be prodrome - stool studies pending as well ad c-dif  -of note lipase wnl at 36 - supportive care with ivfs and antiemetic  - start with clears and advance as tolerated.   DMII  -recently started on Mounjaro  -concern patient current symptoms related to this medication  - will hold diabetic medications for now including oral  -start on is/ fs  for now  - fs currently stable at 213    Afib -s/p ablation 2014 -not on OAC  due to patient preference - continue asa  - hold metoprolol due to low bp  -resume metoprolol as bp tolerates     GERD -ppi    HLD -hold statin for now  -resume in am as able able    HTN -hold bp medications due to relative hypotension    OSA  -cpap as able    DVT prophylaxis: heparin Code Status: full/ as discussed per patient wishes in event of cardiac arrest  Family Communication: daughter [phone] Disposition Plan: patient  expected to be admitted greater than 2 midnights  Consults called: n/a Admission status: med/tele   Beulah Gandy  Clovis Pu MD [facility]   If 7PM-7AM, please contact night-coverage [email or web address] Password [email or web address]  02/19/2023, 1:22 PM

## 2023-02-19 NOTE — Telephone Encounter (Signed)
Ok to offer virtual appt if anyone has appts open this week. Can add on at 9 am tomorrow for me if needed.  Would hold mounjaro's next dose for now. If develops signs of dehydration or severe abd pain, would advise ED.

## 2023-02-19 NOTE — Telephone Encounter (Signed)
Noted.  See phone note.  

## 2023-02-20 ENCOUNTER — Encounter: Payer: Self-pay | Admitting: Internal Medicine

## 2023-02-20 DIAGNOSIS — K529 Noninfective gastroenteritis and colitis, unspecified: Secondary | ICD-10-CM | POA: Diagnosis not present

## 2023-02-20 LAB — COMPREHENSIVE METABOLIC PANEL
ALT: 13 U/L (ref 0–44)
AST: 13 U/L — ABNORMAL LOW (ref 15–41)
Albumin: 3 g/dL — ABNORMAL LOW (ref 3.5–5.0)
Alkaline Phosphatase: 36 U/L — ABNORMAL LOW (ref 38–126)
Anion gap: 6 (ref 5–15)
BUN: 32 mg/dL — ABNORMAL HIGH (ref 8–23)
CO2: 23 mmol/L (ref 22–32)
Calcium: 8.7 mg/dL — ABNORMAL LOW (ref 8.9–10.3)
Chloride: 110 mmol/L (ref 98–111)
Creatinine, Ser: 0.99 mg/dL (ref 0.44–1.00)
GFR, Estimated: 59 mL/min — ABNORMAL LOW (ref >60)
Glucose, Bld: 116 mg/dL — ABNORMAL HIGH (ref 70–99)
Potassium: 4 mmol/L (ref 3.5–5.1)
Sodium: 139 mmol/L (ref 135–145)
Total Bilirubin: 0.9 mg/dL (ref 0.3–1.2)
Total Protein: 5.5 g/dL — ABNORMAL LOW (ref 6.5–8.1)

## 2023-02-20 LAB — CBC
HCT: 35.9 % — ABNORMAL LOW (ref 36.0–46.0)
Hemoglobin: 12.2 g/dL (ref 12.0–15.0)
MCH: 32.4 pg (ref 26.0–34.0)
MCHC: 34 g/dL (ref 30.0–36.0)
MCV: 95.2 fL (ref 80.0–100.0)
Platelets: 99 10*3/uL — ABNORMAL LOW (ref 150–400)
RBC: 3.77 MIL/uL — ABNORMAL LOW (ref 3.87–5.11)
RDW: 13.1 % (ref 11.5–15.5)
WBC: 5.9 10*3/uL (ref 4.0–10.5)
nRBC: 0 % (ref 0.0–0.2)

## 2023-02-20 LAB — GLUCOSE, CAPILLARY
Glucose-Capillary: 184 mg/dL — ABNORMAL HIGH (ref 70–99)
Glucose-Capillary: 113 mg/dL — ABNORMAL HIGH (ref 70–99)

## 2023-02-20 MED ORDER — LISINOPRIL 40 MG PO TABS
20.0000 mg | ORAL_TABLET | Freq: Every day | ORAL | 0 refills | Status: DC
Start: 2023-02-20 — End: 2023-04-10

## 2023-02-20 MED ORDER — HYDROCHLOROTHIAZIDE 25 MG PO TABS
12.5000 mg | ORAL_TABLET | Freq: Every day | ORAL | 0 refills | Status: DC
Start: 2023-02-20 — End: 2023-04-10

## 2023-02-20 NOTE — Care Management Obs Status (Signed)
MEDICARE OBSERVATION STATUS NOTIFICATION   Patient Details  Name: GLADIOLA MADORE MRN: [id number] Date of Birth: [birth year]   Medicare Observation Status Notification Given:  Yes    Chapman Fitch, RN 02/20/2023, 12:01 PM

## 2023-02-20 NOTE — Progress Notes (Signed)
Stacie Porter to be D/C'd Home per MD order.  Discussed prescriptions and follow up appointments with the patient. Prescriptions given to patient, medication list explained in detail. Pt verbalized understanding.  Allergies as of 02/20/2023       Reactions   Multaq [dronedarone]    Unknown reaction         Medication List     STOP taking these medications    ciprofloxacin 500 MG tablet Commonly known as: Cipro   simvastatin 40 MG tablet Commonly known as: ZOCOR   tirzepatide 2.5 MG/0.5ML Pen Commonly known as: MOUNJARO       TAKE these medications    aspirin EC 81 MG tablet Commonly known as: Aspirin Low Dose Take 1 tablet (81 mg total) by mouth daily. SWALLOW WHOLE.   Calcium Carbonate-Vitamin D 600-200 MG-UNIT Caps Take 1 tablet by mouth daily.   dapagliflozin propanediol 10 MG Tabs tablet Commonly known as: Farxiga Take 1 tablet (10 mg total) by mouth daily before breakfast.   Fish Oil 1000 MG Caps Take 1,000 mg by mouth daily.   FreeStyle Libre 2 Sensor Misc 1 each by Does not apply route every 14 (fourteen) days.   hydrochlorothiazide 25 MG tablet Commonly known as: HYDRODIURIL Take 0.5 tablets (12.5 mg total) by mouth daily.   lisinopril 40 MG tablet Commonly known as: ZESTRIL Take 0.5 tablets (20 mg total) by mouth daily. What changed: how much to take   metFORMIN 500 MG 24 hr tablet Commonly known as: GLUCOPHAGE-XR Take 1 tablet (500 mg total) by mouth 2 (two) times daily with a meal.   metoprolol tartrate 25 MG tablet Commonly known as: LOPRESSOR Take 0.5 tablets (12.5 mg total) by mouth 2 (two) times daily.   Multiple Vitamin tablet Take 1 tablet by mouth daily.   omeprazole 20 MG capsule Commonly known as: PRILOSEC Take 1 capsule (20 mg total) by mouth daily.   rosuvastatin 40 MG tablet Commonly known as: CRESTOR Take 1 tablet (40 mg total) by mouth daily.   zinc gluconate 50 MG tablet Take 50 mg by mouth daily.         Vitals:   02/20/23 0601 02/20/23 0850  BP: (!) 115/50 (!) 117/50  Pulse: 72 68  Resp: 20 18  Temp: 98.2 F (36.8 C) 98.2 F (36.8 C)  SpO2: 95% 95%    Skin clean, dry and intact without evidence of skin break down, no evidence of skin tears noted. IV catheter discontinued intact. Site without signs and symptoms of complications. Dressing and pressure applied. Pt denies pain at this time. No complaints noted.  An After Visit Summary was printed and given to the patient. Patient escorted via WC, and D/C home via private auto.  Houston Zapien C. Jilda Roche

## 2023-02-20 NOTE — TOC CM/SW Note (Signed)
Transition of Care [facility]) - Inpatient Brief Assessment   Patient Details  Name: Stacie Porter MRN: [id number] Date of Birth: 1945-09-30  [facility] CM/SW Contact:    Chapman Fitch, RN Phone Number: 02/20/2023, 10:00 AM   Clinical Narrative:    Transition of Care Asessment: Insurance and Status: Insurance coverage has been reviewed Patient has primary care physician: Yes     Prior/Current Home Services: No current home services Social Determinants of Health Reivew: SDOH reviewed no interventions necessary Readmission risk has been reviewed: Yes Transition of care needs: no transition of care needs at this time

## 2023-02-20 NOTE — Care Management CC44 (Signed)
Condition Code 44 Documentation Completed  Patient Details  Name: Stacie Porter MRN: [id number] Date of Birth: 10-06-1945   Condition Code 44 given:  Yes Patient signature on Condition Code 44 notice:  Yes Documentation of 2 MD's agreement:  Yes Code 44 added to claim:  Yes    Chapman Fitch, RN 02/20/2023, 12:01 PM

## 2023-02-20 NOTE — Discharge Summary (Signed)
Physician Discharge Summary   Patient: Stacie Porter MRN: [id number] DOB: 1945-10-09  Admit date:     02/19/2023  Discharge date: 02/20/23  Discharge Physician: Enedina Finner   PCP: Stacie Kindle, FNP   Recommendations at discharge:   keep log of sugars at home and discussed with primary care. Follow-up with PCP in 1 to 2 weeks  Discharge Diagnoses: Principal Problem:   Gastroenteritis  Stacie Porter is a 77 y.o. female with medical history significant of DMII recently started on Mounjaro 6/20,Afib, GERD, HLD, HTN,  OSA cpap , who presents to ED with with 5 days of watery diarrhea and loose stools . Patient states over the last few days she has also started having nausea and vomiting.  She was in contact with her pcp and was prescribed antibiotic. She states she was not able to take antibiotic and due to persistent symptoms presented to ED .  Acute gastroenteritis nos  - presumed medication side-effect vs possible viral--now resolved - stool studies pending as well as c-diff-- patient unable to give any stool sample. No diarrhea since admission -o lipase wnl at 36 - supportive care with ivfs and antiemetic -- symptoms improved. Will start soft carb control diet -patient overall feels back to baseline.   DMII  -recently started on Mounjaro -- not discontinued -concern patient current symptoms related to this medication  -patient will resume her oral diabetes meds at discharge. She'll keep log of her sugars at home discussed with PCP   Afib -s/p ablation 2014 -not on OAC  due to patient preference - continue asa  --resume home meds.    GERD -ppi     HLD continue statins    HTN -blood pressure bit on the softer side. Patient is symptomatic. I have asked her to decrease her dose of lisinopril to 20 mg daily. Continue low-dose hydrochlorothiazide and metoprolol. I have asked her to hold her BP meds on the day she feels very weak and fatigued and monitor her BP at home discussed  with PCP   OSA  -cpap as able    Overall hemodynamically stable. Will discharge to home. Patient agreeable.     Consultants: none Procedures performed: none Disposition: Home Diet recommendation:  Discharge Diet Orders (From admission, onward)     Start     Ordered   02/20/23 0000  Diet - low sodium heart healthy        02/20/23 1128           Carb modified diet DISCHARGE MEDICATION: Allergies as of 02/20/2023       Reactions   Multaq [dronedarone]    Unknown reaction         Medication List     STOP taking these medications    ciprofloxacin 500 MG tablet Commonly known as: Cipro   simvastatin 40 MG tablet Commonly known as: ZOCOR   tirzepatide 2.5 MG/0.5ML Pen Commonly known as: MOUNJARO       TAKE these medications    aspirin EC 81 MG tablet Commonly known as: Aspirin Low Dose Take 1 tablet (81 mg total) by mouth daily. SWALLOW WHOLE.   Calcium Carbonate-Vitamin D 600-200 MG-UNIT Caps Take 1 tablet by mouth daily.   dapagliflozin propanediol 10 MG Tabs tablet Commonly known as: Farxiga Take 1 tablet (10 mg total) by mouth daily before breakfast.   Fish Oil 1000 MG Caps Take 1,000 mg by mouth daily.   FreeStyle Libre 2 Sensor Misc 1 each by Does not  apply route every 14 (fourteen) days.   hydrochlorothiazide 25 MG tablet Commonly known as: HYDRODIURIL Take 0.5 tablets (12.5 mg total) by mouth daily.   lisinopril 40 MG tablet Commonly known as: ZESTRIL Take 0.5 tablets (20 mg total) by mouth daily. What changed: how much to take   metFORMIN 500 MG 24 hr tablet Commonly known as: GLUCOPHAGE-XR Take 1 tablet (500 mg total) by mouth 2 (two) times daily with a meal.   metoprolol tartrate 25 MG tablet Commonly known as: LOPRESSOR Take 0.5 tablets (12.5 mg total) by mouth 2 (two) times daily.   Multiple Vitamin tablet Take 1 tablet by mouth daily.   omeprazole 20 MG capsule Commonly known as: PRILOSEC Take 1 capsule (20 mg total)  by mouth daily.   rosuvastatin 40 MG tablet Commonly known as: CRESTOR Take 1 tablet (40 mg total) by mouth daily.   zinc gluconate 50 MG tablet Take 50 mg by mouth daily.        Follow-up Information     Stacie Kindle, FNP. Schedule an appointment as soon as possible for a visit in 1 week(s).   Specialty: Family Medicine Contact information: [address] [place] [address] [phone]                Discharge Exam: Ceasar Mons Weights   02/19/23 0356 02/20/23 0107  Weight: 99 kg 100.9 kg   Alert and oriented times three, morbid obesity respiratory clear to auscultation no respiratory distress cardiovascular both heart sounds normal no murmur abdomen soft benign nontender neuro- grossly nonfocal  Condition at discharge: fair  The results of significant diagnostics from this hospitalization (including imaging, microbiology, ancillary and laboratory) are listed below for reference.   Imaging Studies: CT ABDOMEN PELVIS WO CONTRAST  Result Date: 02/19/2023 CLINICAL DATA:  Vomiting and diarrhea over the last several days. Taking antibiotics. EXAM: CT ABDOMEN AND PELVIS WITHOUT CONTRAST TECHNIQUE: Multidetector CT imaging of the abdomen and pelvis was performed following the standard protocol without IV contrast. RADIATION DOSE REDUCTION: This exam was performed according to the departmental dose-optimization program which includes automated exposure control, adjustment of the mA and/or kV according to patient size and/or use of iterative reconstruction technique. COMPARISON:  Ultrasound 06/04/2019 FINDINGS: Lower chest: Lung bases are clear. Hepatobiliary: Liver parenchyma is normal without contrast. No calcified gallstones. Pancreas: Normal Spleen: Normal Adrenals/Urinary Tract: Adrenal glands are normal. Kidneys are normal. No mass, stone or hydronephrosis. Bladder is normal. Stomach/Bowel: Tiny a adult hernia. Stomach otherwise normal. No evidence of small bowel  ileus or obstruction. No sign of small bowel inflammatory disease. No visible abnormality of the colon. No gross CT sign of liquid stool. Vascular/Lymphatic: Aortic atherosclerosis. No aneurysm. IVC is normal. No adenopathy. Reproductive: Normal.  No pelvic mass. Other: No free fluid or air. Musculoskeletal: Ordinary lumbar degenerative changes. Small lucent areas in L3 and L5 probably relate to hemangiomas or lipomas. These are nonspecific. 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 IMPRESSION: 1. No acute finding by CT. No sign of bowel ileus or obstruction. No sign of bowel inflammatory disease. No gross CT sign of liquid stool. 2. Aortic atherosclerosis. Aortic Atherosclerosis (ICD10-I70.0). Electronically Signed   By: Paulina Fusi M.D.   On: 02/19/2023 14:16    Microbiology: Results for orders placed or performed during the hospital encounter of 02/19/23  Respiratory (~20 pathogens) panel by PCR     Status: None   Collection Time: 02/19/23  2:33 PM   Specimen: Nasopharyngeal Swab; Respiratory  Result Value Ref  Range Status   Adenovirus NOT DETECTED NOT DETECTED Final   Coronavirus 229E NOT DETECTED NOT DETECTED Final    Comment: (NOTE) The Coronavirus on the Respiratory Panel, DOES NOT test for the novel  Coronavirus (2019 nCoV)    Coronavirus HKU1 NOT DETECTED NOT DETECTED Final   Coronavirus NL63 NOT DETECTED NOT DETECTED Final   Coronavirus OC43 NOT DETECTED NOT DETECTED Final   Metapneumovirus NOT DETECTED NOT DETECTED Final   Rhinovirus / Enterovirus NOT DETECTED NOT DETECTED Final   Influenza A NOT DETECTED NOT DETECTED Final   Influenza B NOT DETECTED NOT DETECTED Final   Parainfluenza Virus 1 NOT DETECTED NOT DETECTED Final   Parainfluenza Virus 2 NOT DETECTED NOT DETECTED Final   Parainfluenza Virus 3 NOT DETECTED NOT DETECTED Final   Parainfluenza Virus 4 NOT DETECTED NOT DETECTED Final   Respiratory Syncytial Virus NOT DETECTED NOT DETECTED Final   Bordetella pertussis NOT  DETECTED NOT DETECTED Final   Bordetella Parapertussis NOT DETECTED NOT DETECTED Final   Chlamydophila pneumoniae NOT DETECTED NOT DETECTED Final   Mycoplasma pneumoniae NOT DETECTED NOT DETECTED Final    Comment: Performed at [facility], [address], [place], [place] [address]    Labs: CBC: Recent Labs  Lab 02/19/23 0400 02/20/23 0418  WBC 6.3 5.9  NEUTROABS 3.7  --   HGB 15.1* 12.2  HCT 44.7 35.9*  MCV 95.5 95.2  PLT 146* 99*   Basic Metabolic Panel: Recent Labs  Lab 02/19/23 0400 02/20/23 0418  NA 136 139  K 4.0 4.0  CL 104 110  CO2 21* 23  GLUCOSE 213* 116*  BUN 58* 32*  CREATININE 1.60* 0.99  CALCIUM 9.4 8.7*   Liver Function Tests: Recent Labs  Lab 02/19/23 0400 02/20/23 0418  AST 17 13*  ALT 17 13  ALKPHOS 48 36*  BILITOT 1.1 0.9  PROT 6.8 5.5*  ALBUMIN 3.7 3.0*   CBG: Recent Labs  Lab 02/19/23 2137 02/20/23 0908  GLUCAP 109* 184*    Discharge time spent: greater than 30 minutes.  Signed: Enedina Finner, MD [facility] 02/20/2023

## 2023-03-02 ENCOUNTER — Inpatient Hospital Stay: Payer: Medicare Other | Admitting: Family Medicine

## 2023-03-04 ENCOUNTER — Other Ambulatory Visit: Payer: Self-pay | Admitting: Family Medicine

## 2023-03-06 ENCOUNTER — Ambulatory Visit (INDEPENDENT_AMBULATORY_CARE_PROVIDER_SITE_OTHER): Payer: Medicare Other | Admitting: Family Medicine

## 2023-03-06 ENCOUNTER — Encounter: Payer: Self-pay | Admitting: Family Medicine

## 2023-03-06 VITALS — BP 126/55 | HR 72 | Ht 65.0 in | Wt 220.0 lb

## 2023-03-06 DIAGNOSIS — Z09 Encounter for follow-up examination after completed treatment for conditions other than malignant neoplasm: Secondary | ICD-10-CM | POA: Diagnosis not present

## 2023-03-06 DIAGNOSIS — K529 Noninfective gastroenteritis and colitis, unspecified: Secondary | ICD-10-CM | POA: Diagnosis not present

## 2023-03-06 DIAGNOSIS — N179 Acute kidney failure, unspecified: Secondary | ICD-10-CM | POA: Diagnosis not present

## 2023-03-06 DIAGNOSIS — E1165 Type 2 diabetes mellitus with hyperglycemia: Secondary | ICD-10-CM | POA: Diagnosis not present

## 2023-03-06 NOTE — Assessment & Plan Note (Signed)
Acute self limiting; following medication start GLP Mounjaro Symptoms resolved; AKI noted on admission- however, recovered with IVFs and no need to repeat with current presentation Continue to recommend balanced, lower carb meals. Smaller meal size, adding snacks. Choosing water as drink of choice and increasing purposeful exercise. Continue ongoing supportive care with bland diet and adequate hydration as needed to thirst

## 2023-03-06 NOTE — Progress Notes (Signed)
Established patient visit   Patient: Stacie Porter   DOB: 12/23/1945   77 y.o. Female  MRN: [id number] Visit Date: 03/06/2023  Today's healthcare provider: Jacky Kindle, FNP  Re Introduced to nurse practitioner role and practice setting.  All questions answered.  Discussed provider/patient relationship and expectations.  Chief Complaint  Patient presents with   hospital f/u    Pt stated--still have nausea, but much better.   Subjective    HPI HPI     hospital f/u    Additional comments: Pt stated--still have nausea, but much better.      Last edited by Shelly Bombard, CMA on 03/06/2023  2:17 PM.     Follow up Hospitalization  Patient was admitted to 6/24 on [facility] and discharged on 6/25. She was treated for gastroenteritis, AKI following medication start. Treatment for this included IVFs. Telephone follow up was not completed. She reports fair compliance with treatment. She reports this condition is improved.  ----------------------------------------------------------------------------------------- -  Medications: Outpatient Medications Prior to Visit  Medication Sig   aspirin (ASPIRIN LOW DOSE) 81 MG EC tablet Take 1 tablet (81 mg total) by mouth daily. SWALLOW WHOLE.   Calcium Carbonate-Vitamin D 600-200 MG-UNIT CAPS Take 1 tablet by mouth daily.   Continuous Blood Gluc Sensor (FREESTYLE LIBRE 2 SENSOR) MISC 1 each by Does not apply route every 14 (fourteen) days.   dapagliflozin propanediol (FARXIGA) 10 MG TABS tablet Take 1 tablet (10 mg total) by mouth daily before breakfast.   hydrochlorothiazide (HYDRODIURIL) 25 MG tablet Take 0.5 tablets (12.5 mg total) by mouth daily.   lisinopril (ZESTRIL) 40 MG tablet Take 0.5 tablets (20 mg total) by mouth daily.   metFORMIN (GLUCOPHAGE-XR) 500 MG 24 hr tablet Take 1 tablet (500 mg total) by mouth 2 (two) times daily with a meal.   metoprolol tartrate (LOPRESSOR) 25 MG tablet Take 0.5 tablets (12.5 mg total) by mouth 2  (two) times daily.   Multiple Vitamin tablet Take 1 tablet by mouth daily.   Omega-3 Fatty Acids (FISH OIL) 1000 MG CAPS Take 1,000 mg by mouth daily.   omeprazole (PRILOSEC) 20 MG capsule Take 1 capsule (20 mg total) by mouth daily.   zinc gluconate 50 MG tablet Take 50 mg by mouth daily.   [DISCONTINUED] rosuvastatin (CRESTOR) 40 MG tablet Take 1 tablet (40 mg total) by mouth daily.   No facility-administered medications prior to visit.    Review of Systems  Last metabolic panel Lab Results  Component Value Date   GLUCOSE 116 (H) 02/20/2023   NA 139 02/20/2023   K 4.0 02/20/2023   CL 110 02/20/2023   CO2 23 02/20/2023   BUN 32 (H) 02/20/2023   CREATININE 0.99 02/20/2023   GFRNONAA 59 (L) 02/20/2023   CALCIUM 8.7 (L) 02/20/2023   PROT 5.5 (L) 02/20/2023   ALBUMIN 3.0 (L) 02/20/2023   LABGLOB 2.2 11/25/2021   AGRATIO 2.0 11/25/2021   BILITOT 0.9 02/20/2023   ALKPHOS 36 (L) 02/20/2023   AST 13 (L) 02/20/2023   ALT 13 02/20/2023   ANIONGAP 6 02/20/2023       Objective    BP (!) 126/55 (BP Location: Left Arm, Patient Position: Sitting, Cuff Size: Normal)   Pulse 72   Ht 5\' 5"  (1.651 m)   Wt 220 lb (99.8 kg)   SpO2 98%   BMI 36.61 kg/m   BP Readings from Last 3 Encounters:  03/06/23 (!) 126/55  02/20/23 (!) 117/50  02/07/23 (!) 131/51   Wt Readings from Last 3 Encounters:  03/06/23 220 lb (99.8 kg)  02/20/23 222 lb 7.1 oz (100.9 kg)  02/07/23 225 lb 11.2 oz (102.4 kg)      Physical Exam Vitals and nursing note reviewed.  Constitutional:      General: She is not in acute distress.    Appearance: Normal appearance. She is obese. She is not ill-appearing, toxic-appearing or diaphoretic.  HENT:     Head: Normocephalic and atraumatic.  Cardiovascular:     Rate and Rhythm: Normal rate and regular rhythm.     Pulses: Normal pulses.     Heart sounds: Normal heart sounds. No murmur heard.    No friction rub. No gallop.  Pulmonary:     Effort: Pulmonary effort  is normal. No respiratory distress.     Breath sounds: Normal breath sounds. No stridor. No wheezing, rhonchi or rales.  Chest:     Chest wall: No tenderness.  Abdominal:     General: Bowel sounds are normal. There is distension.     Palpations: Abdomen is soft.     Tenderness: There is no abdominal tenderness. There is no guarding.  Musculoskeletal:        General: No swelling, tenderness, deformity or signs of injury. Normal range of motion.     Right lower leg: No edema.     Left lower leg: No edema.  Skin:    General: Skin is warm and dry.     Capillary Refill: Capillary refill takes less than 2 seconds.     Coloration: Skin is not jaundiced or pale.     Findings: No bruising, erythema, lesion or rash.  Neurological:     General: No focal deficit present.     Mental Status: She is alert and oriented to person, place, and time. Mental status is at baseline.     Cranial Nerves: No cranial nerve deficit.     Sensory: No sensory deficit.     Motor: No weakness.     Coordination: Coordination normal.  Psychiatric:        Mood and Affect: Mood normal.        Behavior: Behavior normal.        Thought Content: Thought content normal.        Judgment: Judgment normal.     No results found for any visits on 03/06/23.  Assessment & Plan     Problem List Items Addressed This Visit       Digestive   Gastroenteritis    Acute self limiting; following medication start GLP Mounjaro Symptoms resolved; AKI noted on admission- however, recovered with IVFs and no need to repeat with current presentation Continue to recommend balanced, lower carb meals. Smaller meal size, adding snacks. Choosing water as drink of choice and increasing purposeful exercise. Continue ongoing supportive care with bland diet and adequate hydration as needed to thirst        Endocrine   Type 2 diabetes mellitus with hyperglycemia, without long-term current use of insulin (HCC)    Acute self limiting;  following medication start GLP Mounjaro Symptoms resolved; AKI noted on admission- however, recovered with IVFs and no need to repeat with current presentation Continue to recommend balanced, lower carb meals. Smaller meal size, adding snacks. Choosing water as drink of choice and increasing purposeful exercise. Continue ongoing supportive care with bland diet and adequate hydration as needed to thirst        Genitourinary   AKI (  acute kidney injury) (HCC)    Acute self limiting; following medication start GLP Mounjaro Symptoms resolved; AKI noted on admission- however, recovered with IVFs and no need to repeat with current presentation Continue to recommend balanced, lower carb meals. Smaller meal size, adding snacks. Choosing water as drink of choice and increasing purposeful exercise. Continue ongoing supportive care with bland diet and adequate hydration as needed to thirst        Other   Hospital discharge follow-up - Primary    Acute self limiting; following medication start GLP Mounjaro Symptoms resolved; AKI noted on admission- however, recovered with IVFs and no need to repeat with current presentation Continue to recommend balanced, lower carb meals. Smaller meal size, adding snacks. Choosing water as drink of choice and increasing purposeful exercise. Continue ongoing supportive care with bland diet and adequate hydration as needed to thirst      Return if symptoms worsen or fail to improve.     Leilani Merl, FNP, have reviewed all documentation for this visit. The documentation on 03/06/23 for the exam, diagnosis, procedures, and orders are all accurate and complete.  Jacky Kindle, FNP  [facility] [phone] (phone) [phone] (fax)  [facility]

## 2023-03-06 NOTE — Assessment & Plan Note (Signed)
Acute self limiting; following medication start GLP Mounjaro Symptoms resolved; AKI noted on admission- however, recovered with IVFs and no need to repeat with current presentation Continue to recommend balanced, lower carb meals. Smaller meal size, adding snacks. Choosing water as drink of choice and increasing purposeful exercise. Continue ongoing supportive care with bland diet and adequate hydration as needed to thirst 

## 2023-04-02 ENCOUNTER — Encounter: Payer: Self-pay | Admitting: Family Medicine

## 2023-04-02 ENCOUNTER — Other Ambulatory Visit: Payer: Self-pay | Admitting: Family Medicine

## 2023-04-02 MED ORDER — FLUCONAZOLE 150 MG PO TABS: ORAL_TABLET | ORAL | 0 refills | Status: DC

## 2023-04-10 ENCOUNTER — Encounter: Payer: Self-pay | Admitting: Family Medicine

## 2023-04-10 ENCOUNTER — Ambulatory Visit (INDEPENDENT_AMBULATORY_CARE_PROVIDER_SITE_OTHER): Payer: Medicare Other | Admitting: Family Medicine

## 2023-04-10 ENCOUNTER — Other Ambulatory Visit (INDEPENDENT_AMBULATORY_CARE_PROVIDER_SITE_OTHER): Payer: Medicare Other | Admitting: Family Medicine

## 2023-04-10 VITALS — BP 112/51 | HR 62 | Temp 98.1°F | Resp 12 | Ht 65.0 in | Wt 218.7 lb

## 2023-04-10 DIAGNOSIS — E1165 Type 2 diabetes mellitus with hyperglycemia: Secondary | ICD-10-CM

## 2023-04-10 MED ORDER — FREESTYLE LIBRE 2 SENSOR MISC
1.0000 | Status: AC
Start: 2023-04-10 — End: ?

## 2023-04-10 MED ORDER — DAPAGLIFLOZIN PROPANEDIOL 10 MG PO TABS: 10.0000 mg | ORAL_TABLET | Freq: Every day | ORAL | Status: DC

## 2023-04-10 MED ORDER — METOPROLOL TARTRATE 25 MG PO TABS: 12.5000 mg | ORAL_TABLET | Freq: Two times a day (BID) | ORAL | Status: DC

## 2023-04-10 MED ORDER — ASPIRIN 81 MG PO TBEC
81.0000 mg | DELAYED_RELEASE_TABLET | Freq: Every day | ORAL | Status: AC
Start: 2023-04-10 — End: ?

## 2023-04-10 MED ORDER — LISINOPRIL 40 MG PO TABS
20.0000 mg | ORAL_TABLET | Freq: Every day | ORAL | Status: DC
Start: 2023-04-10 — End: 2023-09-13

## 2023-04-10 MED ORDER — METFORMIN HCL ER 500 MG PO TB24: 500.0000 mg | ORAL_TABLET | Freq: Two times a day (BID) | ORAL | Status: DC

## 2023-04-10 MED ORDER — FISH OIL 1000 MG PO CAPS
1000.0000 mg | ORAL_CAPSULE | Freq: Every day | ORAL | Status: AC
Start: 2023-04-10 — End: ?

## 2023-04-10 MED ORDER — HYDROCHLOROTHIAZIDE 25 MG PO TABS: 12.5000 mg | ORAL_TABLET | Freq: Every day | ORAL | Status: DC

## 2023-04-10 NOTE — Assessment & Plan Note (Signed)
Chronic, pt reports CGM readings have been elevated x2 weeks; last 7 day average of 190. Upon daily review; appears to have 2 large spikes post breakfast and post dinner. Encouraged to ensure those meals are balanced with less carbs or exercise after meals and ensure protein intake and water intake are at goals. Declines start of further medications Referral to pharmacist to further assist.

## 2023-04-10 NOTE — Progress Notes (Signed)
Established patient visit   Patient: Stacie Porter   DOB: 11-16-1945   77 y.o. Female  MRN: [id number] Visit Date: 04/10/2023  Today's healthcare provider: Jacky Kindle, FNP  Introduced to nurse practitioner role and practice setting.  All questions answered.  Discussed provider/patient relationship and expectations.  Subjective    Diabetes   HPI   Patient reports her blood sugars are high after eating. She reports waiting a couple of hours before checking 180-250.  Last edited by Myles Lipps, CMA on 04/10/2023 11:10 AM.      Medications: Outpatient Medications Prior to Visit  Medication Sig   Calcium Carbonate-Vitamin D 600-200 MG-UNIT CAPS Take 1 tablet by mouth daily.   fluconazole (DIFLUCAN) 150 MG tablet 5 doses in bottle Take 1 tablet PO for first signs of vaginal yeast infection; repeat dose in 4 days with 1 additional tablet PO.   Multiple Vitamin tablet Take 1 tablet by mouth daily.   omeprazole (PRILOSEC) 20 MG capsule Take 1 capsule (20 mg total) by mouth daily.   zinc gluconate 50 MG tablet Take 50 mg by mouth daily.   [DISCONTINUED] aspirin (ASPIRIN LOW DOSE) 81 MG EC tablet Take 1 tablet (81 mg total) by mouth daily. SWALLOW WHOLE.   [DISCONTINUED] Continuous Blood Gluc Sensor (FREESTYLE LIBRE 2 SENSOR) MISC 1 each by Does not apply route every 14 (fourteen) days.   [DISCONTINUED] dapagliflozin propanediol (FARXIGA) 10 MG TABS tablet Take 1 tablet (10 mg total) by mouth daily before breakfast.   [DISCONTINUED] hydrochlorothiazide (HYDRODIURIL) 25 MG tablet Take 0.5 tablets (12.5 mg total) by mouth daily.   [DISCONTINUED] lisinopril (ZESTRIL) 40 MG tablet Take 0.5 tablets (20 mg total) by mouth daily.   [DISCONTINUED] metFORMIN (GLUCOPHAGE-XR) 500 MG 24 hr tablet Take 1 tablet (500 mg total) by mouth 2 (two) times daily with a meal.   [DISCONTINUED] metoprolol tartrate (LOPRESSOR) 25 MG tablet Take 0.5 tablets (12.5 mg total) by mouth 2 (two) times daily.    [DISCONTINUED] Omega-3 Fatty Acids (FISH OIL) 1000 MG CAPS Take 1,000 mg by mouth daily.   No facility-administered medications prior to visit.    Review of Systems  Last CBC Lab Results  Component Value Date   WBC 5.9 02/20/2023   HGB 12.2 02/20/2023   HCT 35.9 (L) 02/20/2023   MCV 95.2 02/20/2023   MCH 32.4 02/20/2023   RDW 13.1 02/20/2023   PLT 99 (L) 02/20/2023   Last metabolic panel Lab Results  Component Value Date   GLUCOSE 116 (H) 02/20/2023   NA 139 02/20/2023   K 4.0 02/20/2023   CL 110 02/20/2023   CO2 23 02/20/2023   BUN 32 (H) 02/20/2023   CREATININE 0.99 02/20/2023   GFRNONAA 59 (L) 02/20/2023   CALCIUM 8.7 (L) 02/20/2023   PROT 5.5 (L) 02/20/2023   ALBUMIN 3.0 (L) 02/20/2023   LABGLOB 2.2 11/25/2021   AGRATIO 2.0 11/25/2021   BILITOT 0.9 02/20/2023   ALKPHOS 36 (L) 02/20/2023   AST 13 (L) 02/20/2023   ALT 13 02/20/2023   ANIONGAP 6 02/20/2023   Last lipids Lab Results  Component Value Date   CHOL 151 02/05/2023   HDL 39 (L) 02/05/2023   LDLCALC 82 02/05/2023   TRIG 174 (H) 02/05/2023   CHOLHDL 3.9 02/05/2023   Last hemoglobin A1c Lab Results  Component Value Date   HGBA1C 7.8 (H) 02/05/2023     Objective    BP (!) 112/51 (BP Location: Left Arm, Patient Position: Sitting,  Cuff Size: Large)   Pulse 62   Temp 98.1 F (36.7 C) (Temporal)   Resp 12   Ht 5\' 5"  (1.651 m)   Wt 218 lb 11.2 oz (99.2 kg)   SpO2 96%   BMI 36.39 kg/m   BP Readings from Last 3 Encounters:  04/10/23 (!) 112/51  03/06/23 (!) 126/55  02/20/23 (!) 117/50   Wt Readings from Last 3 Encounters:  04/10/23 218 lb 11.2 oz (99.2 kg)  03/06/23 220 lb (99.8 kg)  02/20/23 222 lb 7.1 oz (100.9 kg)   SpO2 Readings from Last 3 Encounters:  04/10/23 96%  03/06/23 98%  02/20/23 95%   Physical Exam Vitals and nursing note reviewed.  Constitutional:      General: She is not in acute distress.    Appearance: Normal appearance. She is obese. She is not ill-appearing,  toxic-appearing or diaphoretic.  HENT:     Head: Normocephalic and atraumatic.  Cardiovascular:     Rate and Rhythm: Normal rate and regular rhythm.     Pulses: Normal pulses.     Heart sounds: Normal heart sounds. No murmur heard.    No friction rub. No gallop.  Pulmonary:     Effort: Pulmonary effort is normal. No respiratory distress.     Breath sounds: Normal breath sounds. No stridor. No wheezing, rhonchi or rales.  Chest:     Chest wall: No tenderness.  Musculoskeletal:        General: No swelling, tenderness, deformity or signs of injury. Normal range of motion.     Right lower leg: No edema.     Left lower leg: No edema.  Skin:    General: Skin is warm and dry.     Capillary Refill: Capillary refill takes less than 2 seconds.     Coloration: Skin is not jaundiced or pale.     Findings: No bruising, erythema, lesion or rash.  Neurological:     General: No focal deficit present.     Mental Status: She is alert and oriented to person, place, and time. Mental status is at baseline.     Cranial Nerves: No cranial nerve deficit.     Sensory: No sensory deficit.     Motor: No weakness.     Coordination: Coordination normal.  Psychiatric:        Mood and Affect: Mood normal.        Behavior: Behavior normal.        Thought Content: Thought content normal.        Judgment: Judgment normal.     No results found for any visits on 04/10/23.  Assessment & Plan     Problem List Items Addressed This Visit       Endocrine   Type 2 diabetes mellitus with hyperglycemia, without long-term current use of insulin (HCC) - Primary    Chronic, pt reports CGM readings have been elevated x2 weeks; last 7 day average of 190. Upon daily review; appears to have 2 large spikes post breakfast and post dinner. Encouraged to ensure those meals are balanced with less carbs or exercise after meals and ensure protein intake and water intake are at goals. Declines start of further  medications Referral to pharmacist to further assist.      Relevant Medications   aspirin EC (ASPIRIN LOW DOSE) 81 MG tablet   Continuous Glucose Sensor (FREESTYLE LIBRE 2 SENSOR) MISC   dapagliflozin propanediol (FARXIGA) 10 MG TABS tablet   hydrochlorothiazide (HYDRODIURIL) 25 MG tablet  lisinopril (ZESTRIL) 40 MG tablet   metFORMIN (GLUCOPHAGE-XR) 500 MG 24 hr tablet   metoprolol tartrate (LOPRESSOR) 25 MG tablet   Omega-3 Fatty Acids (FISH OIL) 1000 MG CAPS     Other   Morbid obesity (HCC)    Chronic, stable Body mass index is 36.39 kg/m. Associated with HTN, HLD, t2Dm       Relevant Medications   dapagliflozin propanediol (FARXIGA) 10 MG TABS tablet   metFORMIN (GLUCOPHAGE-XR) 500 MG 24 hr tablet    Return in about 4 weeks (around 05/08/2023).      Leilani Merl, FNP, have reviewed all documentation for this visit. The documentation on 04/10/23 for the exam, diagnosis, procedures, and orders are all accurate and complete.  Jacky Kindle, FNP  [facility] [phone] (phone) [phone] (fax)  [facility]

## 2023-04-10 NOTE — Assessment & Plan Note (Signed)
Chronic, stable Body mass index is 36.39 kg/m. Associated with HTN, HLD, t2Dm

## 2023-04-16 ENCOUNTER — Encounter: Payer: Self-pay | Admitting: Family Medicine

## 2023-04-16 ENCOUNTER — Ambulatory Visit (INDEPENDENT_AMBULATORY_CARE_PROVIDER_SITE_OTHER): Payer: Medicare Other | Admitting: Family Medicine

## 2023-04-16 VITALS — BP 138/68 | HR 66 | Temp 98.2°F | Resp 20 | Ht 65.0 in | Wt 221.7 lb

## 2023-04-16 DIAGNOSIS — D473 Essential (hemorrhagic) thrombocythemia: Secondary | ICD-10-CM

## 2023-04-16 DIAGNOSIS — R110 Nausea: Secondary | ICD-10-CM

## 2023-04-16 DIAGNOSIS — N179 Acute kidney failure, unspecified: Secondary | ICD-10-CM | POA: Diagnosis not present

## 2023-04-16 MED ORDER — ONDANSETRON HCL 4 MG PO TABS: 4.0000 mg | ORAL_TABLET | Freq: Three times a day (TID) | ORAL | 1 refills | Status: DC | PRN

## 2023-04-16 NOTE — Assessment & Plan Note (Addendum)
Acute, worsening  Likely unrelated to [facility] at this time point, all medication should be washed out of her system by now  Unlikely viral or bacterial due to symptom profile and length of time affected  May be related to previous electrolyte abnormalities / elevated BUN Recheck CMP  Recheck CBC Zofran 4 mg prn q8h for nausea  Ginger lozenges prn for nausea  Encouraged small and frequent meals

## 2023-04-16 NOTE — Progress Notes (Signed)
Established Patient Office Visit  Subjective   Patient ID: Stacie Porter, female    DOB: 11-02-1945  Age: 77 y.o. MRN: [id number]  Chief Complaint  Patient presents with   Nausea    Patient c/o I am very very nauseated, have been off and one since I had an allergic reaction to some medicing on June 26.    Stacie Porter is presenting without about a month and half history of extreme nausea that became worse last night. She starting taking Mounjaro about two months ago, but had an adverse reaction to the medication including nausea, vomiting, and severe diarrhea. She was hospitalized for that initial reaction. She had a CT of abdomen and pelvis inpatient which was nml. The medicine was discontinued. Since discharge she has had constant nausea, some days which is worse than others. Last night she could not sleep the nausea got so bad. She took Zofran with relief around 3 am. She is having continued nausea today. She denies recent changes in bowel movements, vomiting, feeling lightheaded or dizzy, any type of pain, or fever. She has had decreased appetite. She has not felt this nauseas since pregnancy.    Patient Active Problem List   Diagnosis Date Noted   [facility] discharge follow-up 03/06/2023   AKI (acute kidney injury) (HCC) 03/06/2023   Gastroenteritis 02/19/2023   Post-menopausal 02/07/2023   Personal history of diabetic foot ulcer 02/07/2023   Acquired female bladder prolapse 02/07/2023   Functional urinary incontinence 02/07/2023   Gastroesophageal reflux disease with esophagitis without hemorrhage 08/03/2022   Type 2 diabetes mellitus with hyperglycemia, without long-term current use of insulin (HCC) 11/11/2021   Hyperlipidemia associated with type 2 diabetes mellitus (HCC) 07/12/2015   Paroxysmal atrial fibrillation (HCC) 07/12/2015   Morbid obesity (HCC) 05/17/2015   Hypertension associated with diabetes (HCC) 05/17/2015   Essential thrombocythemia (HCC) 10/01/2012      Review of  Systems  Constitutional:  Positive for malaise/fatigue. Negative for chills and fever.  Gastrointestinal:  Positive for nausea. Negative for abdominal pain, diarrhea and vomiting.      Objective:     BP 138/68 (BP Location: Right Arm, Patient Position: Sitting, Cuff Size: Large)   Pulse 66   Temp 98.2 F (36.8 C) (Temporal)   Resp 20   Ht 5\' 5"  (1.651 m)   Wt 221 lb 11.2 oz (100.6 kg)   SpO2 98%   BMI 36.89 kg/m  BP Readings from Last 3 Encounters:  04/16/23 138/68  04/10/23 (!) 112/51  03/06/23 (!) 126/55   Wt Readings from Last 3 Encounters:  04/16/23 221 lb 11.2 oz (100.6 kg)  04/10/23 218 lb 11.2 oz (99.2 kg)  03/06/23 220 lb (99.8 kg)      Physical Exam Constitutional:      Appearance: Normal appearance.  Cardiovascular:     Rate and Rhythm: Normal rate and regular rhythm.     Heart sounds: Normal heart sounds.  Pulmonary:     Effort: Pulmonary effort is normal.     Breath sounds: Normal breath sounds.  Abdominal:     General: Abdomen is flat. Bowel sounds are normal.     Palpations: Abdomen is soft.  Neurological:     General: No focal deficit present.     Mental Status: She is alert and oriented to person, place, and time.     No results found for any visits on 04/16/23.  Last CBC Lab Results  Component Value Date   WBC 5.9 02/20/2023   HGB  12.2 02/20/2023   HCT 35.9 (L) 02/20/2023   MCV 95.2 02/20/2023   MCH 32.4 02/20/2023   RDW 13.1 02/20/2023   PLT 99 (L) 02/20/2023   Last metabolic panel Lab Results  Component Value Date   GLUCOSE 116 (H) 02/20/2023   NA 139 02/20/2023   K 4.0 02/20/2023   CL 110 02/20/2023   CO2 23 02/20/2023   BUN 32 (H) 02/20/2023   CREATININE 0.99 02/20/2023   GFRNONAA 59 (L) 02/20/2023   CALCIUM 8.7 (L) 02/20/2023   PROT 5.5 (L) 02/20/2023   ALBUMIN 3.0 (L) 02/20/2023   LABGLOB 2.2 11/25/2021   AGRATIO 2.0 11/25/2021   BILITOT 0.9 02/20/2023   ALKPHOS 36 (L) 02/20/2023   AST 13 (L) 02/20/2023   ALT 13  02/20/2023   ANIONGAP 6 02/20/2023      The 10-year ASCVD risk score (Arnett DK, et al., 2019) is: 31.5%    Assessment & Plan:   Problem List Items Addressed This Visit       Genitourinary   AKI (acute kidney injury) (HCC)    Acute due to volume depletion  Recheck CMP      Relevant Orders   Comprehensive metabolic panel     Hematopoietic and Hemostatic   Essential thrombocythemia (HCC)    Chronic Most recent CBC in [place] significant for low platelets  Recheck CBC      Relevant Medications   ondansetron (ZOFRAN) 4 MG tablet   Other Relevant Orders   CBC w/Diff/Platelet     Other   Nausea - Primary    Acute, worsening  Likely unrelated to Stacie Porter at this time point, all medication should be washed out of her system by now  Unlikely viral or bacterial due to symptom profile and length of time affected  May be related to previous electrolyte abnormalities / elevated BUN Recheck CMP  Recheck CBC Zofran 4 mg prn q8h for nausea  Ginger lozenges prn for nausea  Encouraged small and frequent meals         Relevant Orders   CBC w/Diff/Platelet   Comprehensive metabolic panel    Return if symptoms worsen or fail to improve.    Rometta Emery, Medical Student  Patient seen along with MS3 student Jodi Marble. I personally evaluated this patient along with the student, and verified all aspects of the history, physical exam, and medical decision making as documented by the student. I agree with the student's documentation and have made all necessary edits.  Ilyanna Baillargeon, Marzella Schlein, MD, MPH [facility]

## 2023-04-16 NOTE — Assessment & Plan Note (Signed)
Chronic Most recent CBC in hospital significant for low platelets  Recheck CBC

## 2023-04-16 NOTE — Assessment & Plan Note (Signed)
Acute due to volume depletion  Recheck CMP

## 2023-04-17 ENCOUNTER — Encounter: Payer: Self-pay | Admitting: Family Medicine

## 2023-04-17 ENCOUNTER — Ambulatory Visit
Admission: RE | Admit: 2023-04-17 | Discharge: 2023-04-17 | Disposition: A | Discharge status: Home / Self Care | Payer: Medicare Other | Source: Ambulatory Visit | Attending: Family Medicine | Admitting: Family Medicine

## 2023-04-17 DIAGNOSIS — Z780 Asymptomatic menopausal state: Secondary | ICD-10-CM | POA: Diagnosis present

## 2023-04-17 LAB — CBC WITH DIFFERENTIAL/PLATELET
Basophils Absolute: 0 10*3/uL (ref 0.0–0.2)
Basos: 0 %
EOS (ABSOLUTE): 0.1 10*3/uL (ref 0.0–0.4)
Eos: 1 %
Hematocrit: 39.8 % (ref 34.0–46.6)
Hemoglobin: 13.1 g/dL (ref 11.1–15.9)
Immature Grans (Abs): 0 10*3/uL (ref 0.0–0.1)
Immature Granulocytes: 0 %
Lymphocytes Absolute: 1.9 10*3/uL (ref 0.7–3.1)
Lymphs: 27 %
MCH: 31.2 pg (ref 26.6–33.0)
MCHC: 32.9 g/dL (ref 31.5–35.7)
MCV: 95 fL (ref 79–97)
Monocytes Absolute: 0.7 10*3/uL (ref 0.1–0.9)
Monocytes: 10 %
Neutrophils Absolute: 4.5 10*3/uL (ref 1.4–7.0)
Neutrophils: 62 %
Platelets: 118 10*3/uL — ABNORMAL LOW (ref 150–450)
RBC: 4.2 x10E6/uL (ref 3.77–5.28)
RDW: 13.2 % (ref 11.7–15.4)
WBC: 7.2 10*3/uL (ref 3.4–10.8)

## 2023-04-17 LAB — COMPREHENSIVE METABOLIC PANEL
ALT: 15 IU/L (ref 0–32)
AST: 17 IU/L (ref 0–40)
Albumin: 4.4 g/dL (ref 3.8–4.8)
Alkaline Phosphatase: 61 IU/L (ref 44–121)
BUN/Creatinine Ratio: 25 (ref 12–28)
BUN: 25 mg/dL (ref 8–27)
Bilirubin Total: 0.4 mg/dL (ref 0.0–1.2)
CO2: 25 mmol/L (ref 20–29)
Calcium: 9.9 mg/dL (ref 8.7–10.3)
Chloride: 100 mmol/L (ref 96–106)
Creatinine, Ser: 1.02 mg/dL — ABNORMAL HIGH (ref 0.57–1.00)
Globulin, Total: 2 g/dL (ref 1.5–4.5)
Glucose: 140 mg/dL — ABNORMAL HIGH (ref 70–99)
Potassium: 4.3 mmol/L (ref 3.5–5.2)
Sodium: 139 mmol/L (ref 134–144)
Total Protein: 6.4 g/dL (ref 6.0–8.5)
eGFR: 57 mL/min/{1.73_m2} — ABNORMAL LOW (ref >59)

## 2023-04-20 ENCOUNTER — Ambulatory Visit: Payer: Medicare Other | Admitting: Obstetrics and Gynecology

## 2023-04-20 ENCOUNTER — Encounter: Payer: Self-pay | Admitting: Obstetrics and Gynecology

## 2023-04-20 VITALS — BP 130/51 | HR 56 | Ht 64.0 in | Wt 223.0 lb

## 2023-04-20 DIAGNOSIS — R350 Frequency of micturition: Secondary | ICD-10-CM | POA: Diagnosis not present

## 2023-04-20 DIAGNOSIS — N3281 Overactive bladder: Secondary | ICD-10-CM | POA: Diagnosis not present

## 2023-04-20 DIAGNOSIS — N816 Rectocele: Secondary | ICD-10-CM

## 2023-04-20 DIAGNOSIS — N8110 Cystocele, unspecified: Secondary | ICD-10-CM

## 2023-04-20 DIAGNOSIS — N393 Stress incontinence (female) (male): Secondary | ICD-10-CM

## 2023-04-20 LAB — POCT URINALYSIS DIPSTICK
Bilirubin, UA: NEGATIVE
Blood, UA: NEGATIVE
Glucose, UA: POSITIVE — AB
Ketones, UA: NEGATIVE
Leukocytes, UA: NEGATIVE
Nitrite, UA: NEGATIVE
Protein, UA: NEGATIVE
Spec Grav, UA: 1.02 (ref 1.010–1.025)
Urobilinogen, UA: 0.2 E.U./dL
pH, UA: 7 (ref 5.0–8.0)

## 2023-04-20 MED ORDER — MIRABEGRON ER 25 MG PO TB24
25.0000 mg | ORAL_TABLET | Freq: Every day | ORAL | 5 refills | Status: DC
Start: 2023-04-20 — End: 2023-05-18

## 2023-04-20 NOTE — Patient Instructions (Addendum)
You have a stage 2 (out of 4) prolapse.  We discussed the fact that it is not life threatening but there are several treatment options. For treatment of pelvic organ prolapse, we discussed options for management including expectant management, conservative management, and surgical management, such as Kegels, a pessary, pelvic floor physical therapy, and specific surgical procedures.     For treatment of stress urinary incontinence, which is leakage with physical activity/movement/strainging/coughing, we discussed expectant management versus nonsurgical options versus surgery. Nonsurgical options include weight loss, physical therapy, as well as a pessary.  Surgical options include a midurethral sling, which is a synthetic mesh sling that acts like a hammock under the urethra to prevent leakage of urine, and transurethral injection of a bulking agent.  We discussed the symptoms of overactive bladder (OAB), which include urinary urgency, urinary frequency, night-time urination, with or without urge incontinence.  We discussed management including behavioral therapy (decreasing bladder irritants by following a bladder diet, urge suppression strategies, timed voids, bladder retraining), physical therapy, medication.  You were provided with a prescription for Myrbetriq 25mg  daily.    URODYNAMICS (UDS) TEST INFORMATION  IMPORTANT: Please try to arrive with a comfortably full bladder!    What is UDS? Urodynamics is a bladder test used to evaluate how your bladder and urethra (tube you urinate out of) work to help find out the cause of your bladder symptoms and evaluate your bladder function in order to make the best treatment plan for you.   What to expect? A nurse will perform the test and will be with you during the entire exam. First we will have to empty your bladder on a special toilet.  After you have emptied your bladder, very small catheters (plastic tubing) will be placed into your bladder and  into your vagina (or rectum). These special small catheters measure pressure to help measure your bladder function.  Your bladder will be gently filled with water and you will be asked to cough and strain at several different points during the test.   You will then be asked to empty your bladder in the special toilet with the catheters in place. Most patients can urinate (pee) easily with the catheters in place since the catheters are so small. In total this procedure lasts about 45 minutes to 1 hour.  After your test is completed, you will return (or possibly be seen the same day) to review the results, talk about treatment options and make a plan moving forward.

## 2023-04-20 NOTE — Progress Notes (Signed)
[facility] New Patient Evaluation and Consultation  Referring Provider: Jacky Kindle, FNP PCP: Jacky Kindle, FNP Date of Service: 04/20/2023  SUBJECTIVE Chief Complaint: New Patient (Initial Visit) Stacie Porter is a 77 y.o. female here for a consult for urinary urgency and SUI.)  History of Present Illness: Stacie Porter is a 77 y.o. White or Caucasian female seen in consultation at the request of FNP Merita Norton for evaluation of prolapse.     Urinary Symptoms: Leaks urine with cough/ sneeze, laughing, going from sitting to standing, during sex, and with a full bladder Leaks 2-3+ time(s) per day. UUI > SUI, usually with running water or on the way to the toilet Pad use: 2 pads per day.   She is bothered by her UI symptoms.  Day time voids 9-10, can usually hold if needed.  Nocturia: 3-4 times per night to void. Voiding dysfunction: she does not empty her bladder well.  does not use a catheter to empty bladder.  When urinating, she feels dribbling after finishing Drinks: water, 1 glass decaf unsweet tea at dinner  UTIs:  0  UTI's in the last year.   Denies history of blood in urine and kidney or bladder stones  Pelvic Organ Prolapse Symptoms:                  She Denies a feeling of a bulge the vaginal area.   Bowel Symptom: Bowel movements: 1 time(s) per day Stool consistency: soft  Straining: no.  Splinting: no.  Incomplete evacuation: no.  She Denies accidental bowel leakage / fecal incontinence Bowel regimen: none  Sexual Function Sexually active: yes.  Sexual orientation: Straight Pain with sex: No  Pelvic Pain Denies pelvic pain   Past Medical History:  Past Medical History:  Diagnosis Date   Cataract    Diabetes mellitus without complication (HCC)    type 2   Dysrhythmia    a fib   GERD (gastroesophageal reflux disease)    Hx of basal cell carcinoma ~2014 Mohs   Nose   Hyperlipidemia    Hypertension    Osteomyelitis of second  toe of right foot (HCC) 11/11/2018   Sleep apnea    cpap     Past Surgical History:   Past Surgical History:  Procedure Laterality Date   AMPUTATION TOE Right 11/11/2018   Procedure: AMPUTATION RIGHT 2ND TOE;  Surgeon: Jodi Geralds, MD;  Location: [facility];  Service: Orthopedics;  Laterality: Right;   APPENDECTOMY     CARDIAC ELECTROPHYSIOLOGY STUDY AND ABLATION     cather ablation  2011/2014   cardiac ablation for a fib   COLONOSCOPY WITH PROPOFOL N/A 02/11/2021   Procedure: COLONOSCOPY WITH PROPOFOL;  Surgeon: Pasty Spillers, MD;  Location: [facility];  Service: Endoscopy;  Laterality: N/A;   EYE SURGERY Bilateral 05/2014   cataract   HAMMER TOE SURGERY Right    HAND SURGERY Bilateral    LAPAROSCOPIC OOPHERECTOMY     recession of gums sx     TUBAL LIGATION       Past OB/GYN History: OB History  Gravida Para Term Preterm AB Living  3 3 1 2   3   SAB IAB Ectopic Multiple Live Births               # Outcome Date GA Lbr Len/2nd Weight Sex Type Anes PTL Lv  3 Preterm      Vag-Spont     2A Preterm  Vag-Spont     2B Preterm      Vag-Spont     1 Term      Vag-Spont      Menopausal: Denies vaginal bleeding since menopause Any history of abnormal pap smears: no.   Medications: She has a current medication list which includes the following prescription(s): aspirin ec, calcium carbonate-vitamin d, freestyle libre 2 sensor, dapagliflozin propanediol, fluconazole, hydrochlorothiazide, lisinopril, metformin, metoprolol tartrate, mirabegron er, multiple vitamin, fish oil, omeprazole, ondansetron, and zinc gluconate.   Allergies: Patient is allergic to multaq [dronedarone].   Social History:  Social History   Tobacco Use   Smoking status: Never   Smokeless tobacco: Never  Vaping Use   Vaping status: Never Used  Substance Use Topics   Alcohol use: No   Drug use: No    Relationship status: married She lives with her husband.   She is employed. Regular  exercise: Yes: walking History of abuse: No  Family History:   Family History  Problem Relation Age of Onset   Ovarian cancer Sister    Cancer Sister        ovarian   Lung cancer Sister    Cancer Sister        lung and thyroid   Thyroid cancer Sister    Cancer Sister    Heart disease Mother    Heart disease Father    Hypertension Father    Diabetes Maternal Uncle    Diabetes Maternal Uncle    Breast cancer Neg Hx      Review of Systems: Review of Systems  Constitutional:  Negative for fever, malaise/fatigue and weight loss.  Respiratory:  Negative for cough, shortness of breath and wheezing.   Cardiovascular:  Negative for chest pain, palpitations and leg swelling.  Gastrointestinal:  Negative for abdominal pain and blood in stool.  Genitourinary:  Negative for dysuria.  Musculoskeletal:  Negative for myalgias.  Skin:  Negative for rash.  Neurological:  Negative for dizziness and headaches.  Endo/Heme/Allergies:  Does not bruise/bleed easily.  Psychiatric/Behavioral:  Negative for depression. The patient is not nervous/anxious.      OBJECTIVE Physical Exam: Vitals:   04/20/23 0851  BP: (!) 130/51  Pulse: (!) 56  Weight: 223 lb (101.2 kg)  Height: 5\' 4"  (1.626 m)    Physical Exam Constitutional:      General: She is not in acute distress. Pulmonary:     Effort: Pulmonary effort is normal.  Abdominal:     General: There is no distension.     Palpations: Abdomen is soft.     Tenderness: There is no abdominal tenderness. There is no rebound.  Musculoskeletal:        General: No swelling. Normal range of motion.  Skin:    General: Skin is warm and dry.     Findings: No rash.  Neurological:     Mental Status: She is alert and oriented to person, place, and time.  Psychiatric:        Mood and Affect: Mood normal.        Behavior: Behavior normal.      GU / Detailed Urogynecologic Evaluation:  Pelvic Exam: Normal external female genitalia; Bartholin's  and Skene's glands normal in appearance; urethral meatus normal in appearance, no urethral masses or discharge.   CST: negative.  Speculum exam reveals normal vaginal mucosa with atrophy. Cervix normal appearance. Uterus normal single, nontender. Adnexa no mass, fullness, tenderness.    Pelvic floor strength I/V  Pelvic floor musculature:  Right levator non-tender, Right obturator non-tender, Left levator non-tender, Left obturator non-tender  POP-Q:   POP-Q  0                                            Aa   0                                           Ba  -8.5                                              C   3.5                                            Gh  3.5                                            Pb  9                                            tvl   -1                                            Ap  -1                                            Bp  -8.5                                              D      Rectal Exam:  Normal external rectum  Post-Void Residual (PVR) by Bladder Scan: In order to evaluate bladder emptying, we discussed obtaining a postvoid residual and she agreed to this procedure.  Procedure: The ultrasound unit was placed on the patient's abdomen in the suprapubic region after the patient had voided. A PVR of 49 ml was obtained by bladder scan.  Laboratory Results: POC urine: positive glucose, otherwise negative  ASSESSMENT AND PLAN Ms. Cramblit is a 76 y.o. with:  1. Overactive bladder   2. Urinary frequency   3. SUI (stress urinary incontinence, female)   4. Prolapse of anterior vaginal wall   5. Prolapse of posterior vaginal wall    OAB - We discussed the symptoms of overactive bladder (OAB), which include urinary urgency, urinary frequency, nocturia, with or without urge incontinence.  While we do not know the exact etiology of OAB, several treatment options exist. We discussed management including  behavioral therapy (decreasing  bladder irritants, urge suppression strategies, timed voids, bladder retraining), physical therapy, medication; for refractory cases posterior tibial nerve stimulation, sacral neuromodulation, and intravesical botulinum toxin injection.  - Prescribed myrbetriq 25mg  daily. For Beta-3 agonist medication, we discussed the potential side effect of elevated blood pressure which is more likely to occur in individuals with uncontrolled hypertension.  2. SUI - For treatment of stress urinary incontinence,  non-surgical options include expectant management, weight loss, physical therapy, as well as a pessary.  Surgical options include a midurethral sling, Burch urethropexy, and transurethral injection of a bulking agent. - She is deciding on midurethral sling vs bulking and handouts provided on each - Will have her undergo urodynamic testing to demonstrate leakage  3. Stage II anterior, Stage II posterior, Stage I apical prolapse - She is currently asymptomatic from her prolapse. However, we discussed that if she wants a sling, then will need to restore the bladder to its anatomical position with an anterior repair concurrently. Can also consider posterior repair at the same time. Handouts provided on these procedures  Return for urodynamics and follow up   Marguerita Beards, MD

## 2023-04-23 ENCOUNTER — Telehealth: Payer: Self-pay

## 2023-04-23 NOTE — Progress Notes (Signed)
   Care Guide Note  04/23/2023 Name: Stacie Porter MRN: [id number] DOB: Apr 12, 1946  Referred by: Jacky Kindle, FNP Reason for referral : Care Coordination (Outreach to schedule with Pharm d )   Stacie Porter is a 77 y.o. year old female who is a primary care patient of Jacky Kindle, FNP. Stacie Porter was referred to the pharmacist for assistance related to DM.    An unsuccessful telephone outreach was attempted today to contact the patient who was referred to the pharmacy team for assistance with medication management. Additional attempts will be made to contact the patient.   Penne Lash, RMA Care Guide [facility]  [place], [place] [address] Direct Dial: [phone] [email or web address]

## 2023-05-02 ENCOUNTER — Ambulatory Visit: Payer: Medicare Other | Admitting: Obstetrics and Gynecology

## 2023-05-02 ENCOUNTER — Encounter: Payer: Self-pay | Admitting: Obstetrics and Gynecology

## 2023-05-02 VITALS — BP 158/64 | HR 64

## 2023-05-02 DIAGNOSIS — N393 Stress incontinence (female) (male): Secondary | ICD-10-CM

## 2023-05-02 DIAGNOSIS — N3281 Overactive bladder: Secondary | ICD-10-CM

## 2023-05-02 DIAGNOSIS — R350 Frequency of micturition: Secondary | ICD-10-CM

## 2023-05-02 LAB — POCT URINALYSIS DIPSTICK
Bilirubin, UA: NEGATIVE
Blood, UA: NEGATIVE
Glucose, UA: POSITIVE — AB
Ketones, UA: NEGATIVE
Leukocytes, UA: NEGATIVE
Nitrite, UA: NEGATIVE
Protein, UA: NEGATIVE
Spec Grav, UA: <=1.005 — AB (ref 1.010–1.025)
Urobilinogen, UA: 0.2 U/dL
pH, UA: 6 (ref 5.0–8.0)

## 2023-05-02 MED ORDER — PHENAZOPYRIDINE HCL 95 MG PO TABS: 95.0000 mg | ORAL_TABLET | Freq: Three times a day (TID) | ORAL | 1 refills | Status: DC | PRN

## 2023-05-02 NOTE — Patient Instructions (Addendum)
Taking Care of Yourself after Urodynamics  Drink plenty of water for a day or two following your procedure. Try to have about 8 ounces (one cup) at a time, and do this 6 times or more per day unless you have fluid restrictitons AVOID irritative beverages such as coffee, tea, soda, alcoholic or citrus drinks for a day or two, as this may cause burning with urination. You may experience some discomfort or a burning sensation with urination after having this procedure. You can use over the counter Azo or pyridium to help with burning and follow the instructions on the packaging. If it does not improve within 1-2 days, or other symptoms appear (fever, chills, or difficulty urinating) call the office to speak to a nurse.  You may return to normal daily activities such as work, school, driving, exercising and housework on the day of the procedure. If your doctor gave you a prescription, take it as ordered.     You can consider pumpkin seed extract for overactive bladder/nighttime urination symptoms. Up to 5gm (5000mg ) daily has been shown to be effective.

## 2023-05-02 NOTE — Progress Notes (Signed)
[facility] Urodynamics Procedure  Referring Physician: Jacky Kindle, FNP Date of Procedure: 05/02/2023  Stacie Porter is a 77 y.o. female who presents for urodynamic evaluation. Indication(s) for study: SUI and OAB  Vital Signs: BP (!) 178/70   Pulse 64   Laboratory Results: A catheterized urine specimen revealed:  POC urine: +Glucose, negative    Voiding Diary: Not done  Procedure Timeout:  The correct patient was verified and the correct procedure was verified. The patient was in the correct position and safety precautions were reviewed based on at the patient's history.  Urodynamic Procedure A 52F dual lumen urodynamics catheter was placed under sterile conditions into the patient's bladder. A 52F catheter was placed into the rectum in order to measure abdominal pressure. EMG patches were placed in the appropriate position.  All connections were confirmed and calibrations/adjusted made. Saline was instilled into the bladder through the dual lumen catheters.  Cough/valsalva pressures were measured periodically during filling.  Patient was allowed to void.  The bladder was then emptied of its residual.  UROFLOW: Uroflow did not capture Patient voided with a cathed PVR of 10ml  CMG: This was performed with sterile water in the sitting position at a fill rate of 20-30 mL/min.    First sensation of fullness was 46 mLs,  First urge was 124 mLs,  Strong urge was 241 mLs and  Capacity was 287 mLs  Stress incontinence was demonstrated Highest positive CLPP was 118 cmH20 at 201 ml. Highest positive VLPP was 88 cmH20at 201 ml.   Detrusor function was overactive, with phasic contractions seen.  The first occurred at 61 mL to 4 cm of water and was associated with urge.  Compliance:  Normal. End fill detrusor pressure was 5.4cmH20.  Calculated compliance was 53.15mL/cmH20  UPP: UPP did not capture   MICTURITION STUDY: Voiding was performed with reduction  using scopettes in the sitting position.  Pdet at Qmax was 31.7 cm of water.  Qmax was 11 mL/sec.  It was a normal pattern.  She voided 212 mL and had a residual of 75 mL.  It was a volitional void, sustained detrusor contraction was present and abdominal straining was not present  EMG: This was performed with patches.  She had voluntary contractions, recruitment with fill was not present and urethral sphincter was relaxed with void.  The details of the procedure with the study tracings have been scanned into EPIC.   Urodynamic Impression:  1. Sensation was increased; capacity was reduced 2. Stress Incontinence was demonstrated at normal pressures; 3. Detrusor Overactivity was demonstrated without leakage. 4. Emptying was normal with a normal PVR, a sustained detrusor contraction present,  abdominal straining not present, normal urethral sphincter activity on EMG.  Plan: - The patient will follow up  to discuss the findings and treatment options.

## 2023-05-03 ENCOUNTER — Ambulatory Visit (INDEPENDENT_AMBULATORY_CARE_PROVIDER_SITE_OTHER): Payer: Medicare Other | Admitting: Family Medicine

## 2023-05-03 ENCOUNTER — Encounter: Payer: Self-pay | Admitting: Family Medicine

## 2023-05-03 VITALS — BP 124/60 | HR 59 | Temp 98.0°F | Resp 16 | Ht 64.0 in | Wt 220.5 lb

## 2023-05-03 DIAGNOSIS — Z7984 Long term (current) use of oral hypoglycemic drugs: Secondary | ICD-10-CM

## 2023-05-03 DIAGNOSIS — E1159 Type 2 diabetes mellitus with other circulatory complications: Secondary | ICD-10-CM | POA: Diagnosis not present

## 2023-05-03 DIAGNOSIS — E118 Type 2 diabetes mellitus with unspecified complications: Secondary | ICD-10-CM

## 2023-05-03 DIAGNOSIS — N3281 Overactive bladder: Secondary | ICD-10-CM

## 2023-05-03 DIAGNOSIS — D473 Essential (hemorrhagic) thrombocythemia: Secondary | ICD-10-CM | POA: Diagnosis not present

## 2023-05-03 DIAGNOSIS — I480 Paroxysmal atrial fibrillation: Secondary | ICD-10-CM

## 2023-05-03 DIAGNOSIS — E1169 Type 2 diabetes mellitus with other specified complication: Secondary | ICD-10-CM

## 2023-05-03 DIAGNOSIS — I152 Hypertension secondary to endocrine disorders: Secondary | ICD-10-CM

## 2023-05-03 DIAGNOSIS — E785 Hyperlipidemia, unspecified: Secondary | ICD-10-CM

## 2023-05-03 DIAGNOSIS — N8110 Cystocele, unspecified: Secondary | ICD-10-CM

## 2023-05-03 NOTE — Progress Notes (Signed)
   Care Guide Note  05/03/2023 Name: Stacie Porter MRN: [id number] DOB: 16-Sep-1945  Referred by: Dana Allan, MD Reason for referral : Care Coordination (Outreach to schedule with Pharm d )   Stacie Porter is a 77 y.o. year old female who is a primary care patient of Dana Allan, MD. Stacie Porter was referred to the pharmacist for assistance related to DM.    Successful contact was made with the patient to discuss pharmacy services. Patient declines engagement at this time. Contact information was provided to the patient should they wish to reach out for assistance at a later time.Patient states that she is changing PCP   Stacie Porter, RMA Care Guide [facility]  [place], [place] [address] Direct Dial: [phone] [email or web address]

## 2023-05-03 NOTE — Progress Notes (Signed)
SUBJECTIVE:   Chief Complaint  Patient presents with   Establish Care   HPI Presents to clinic to establish care  No acute concerns  Planning for upcoming travel, bus tour in future.  DM Type 2 Asymptomatic.  Takes Farxiga 10 mg daily, Metformin XR 500 mg BID.  Recent A1c 7.8.   Follows with Dr Alvester Morin for eye exam.  On statin/ACEi.   HTN Asymptomatic.  Takes Hydrodiuril 12.5 mg daily, Zestril 20 mg daily and Metoprolol XL  12.5 mg BID.  Reports BP at home 120/50.  Denies any dizziness, weakness or falls.    Afib Currently rate controlled.  Takes Metoprolol XL 12.5 mg BID.  Not currently on anticoagulation.  Follows with Cardiology.  HLD Takes Zocor 40 mg daily and tolerating well.  Follows with Urology for OAB Plans to stop Myrbetriq in future  OSA on CPAP Follows with Pulmonology  Chronic ITP Asymptomatic.  Currently on ASA low dose.  Previously seen Oncology at [facility].    PERTINENT PMH / PSH: HTN DM T2 Afib not not on anticoagulation Obesity class 2 HLD Chronic ITP OSA on CPAP Basal cell carcinoma  OBJECTIVE:  BP 124/60   Pulse (!) 59   Temp 98 F (36.7 C)   Resp 16   Ht 5\' 4"  (1.626 m)   Wt 220 lb 8 oz (100 kg)   SpO2 95%   BMI 37.85 kg/m    Physical Exam Vitals reviewed.  Constitutional:      General: She is not in acute distress.    Appearance: She is obese. She is not ill-appearing.  HENT:     Head: Normocephalic.     Right Ear: Tympanic membrane, ear canal and external ear normal.     Left Ear: Tympanic membrane, ear canal and external ear normal.     Nose: Nose normal.     Mouth/Throat:     Mouth: Mucous membranes are moist.  Eyes:     Extraocular Movements: Extraocular movements intact.     Conjunctiva/sclera: Conjunctivae normal.     Pupils: Pupils are equal, round, and reactive to light.  Neck:     Thyroid: No thyromegaly or thyroid tenderness.     Vascular: No carotid bruit.  Cardiovascular:     Rate and Rhythm: Regular rhythm.  Bradycardia present.     Pulses: Normal pulses.     Heart sounds: Normal heart sounds. No murmur heard. Pulmonary:     Effort: Pulmonary effort is normal.     Breath sounds: Normal breath sounds.  Abdominal:     General: Bowel sounds are normal. There is no distension.     Palpations: Abdomen is soft.     Tenderness: There is no abdominal tenderness. There is no right CVA tenderness, left CVA tenderness, guarding or rebound.  Musculoskeletal:        General: Normal range of motion.     Cervical back: Normal range of motion.     Right lower leg: No edema.     Left lower leg: No edema.  Lymphadenopathy:     Cervical: No cervical adenopathy.  Skin:    Capillary Refill: Capillary refill takes less than 2 seconds.  Neurological:     General: No focal deficit present.     Mental Status: She is alert and oriented to person, place, and time. Mental status is at baseline.     Motor: No weakness.  Psychiatric:        Mood and Affect: Mood normal.  Behavior: Behavior normal.        Thought Content: Thought content normal.        Judgment: Judgment normal.        05/18/2023    7:58 AM 05/03/2023    9:34 AM 04/10/2023   11:12 AM 01/17/2023    1:24 PM 11/27/2022    8:36 AM  Depression screen PHQ 2/9  Decreased Interest 0 0 0 0 0  Down, Depressed, Hopeless 0 0 0 0 0  PHQ - 2 Score 0 0 0 0 0  Altered sleeping 0 0  1   Tired, decreased energy 0 0  0   Change in appetite 0 0  0   Feeling bad or failure about yourself  0 0  0   Trouble concentrating 0 0  0   Moving slowly or fidgety/restless 0 0  0   Suicidal thoughts 0 0  0   PHQ-9 Score 0 0  1   Difficult doing work/chores Not difficult at all Not difficult at all  Not difficult at all       05/18/2023    7:58 AM 05/03/2023    9:34 AM  GAD 7 : Generalized Anxiety Score  Nervous, Anxious, on Edge 0 0  Control/stop worrying 0 0  Worry too much - different things 0 0  Trouble relaxing 0 0  Restless 0 0  Easily annoyed or  irritable 0 0  Afraid - awful might happen 0 0  Total GAD 7 Score 0 0  Anxiety Difficulty Not difficult at all Not difficult at all    ASSESSMENT/PLAN:  Type 2 diabetes mellitus with complications (HCC) Assessment & Plan: Chronic Recent A1c 7.8. Goal for age 63.0-7.5 Continue Metformin XR 500 mg BID Continue Farxiga 10 mg daily Eye exam with Dr Alvester Morin Foot exam at next visit UACR/ A1c at next visit On statin/ACEi    Morbid obesity (HCC) Assessment & Plan: Elevated BMI  Treated for DM T2, HTN, HLD Previously tried [facility] but unable to tolerate due to GI side effects Encouraged portion control and healthy nutrition   Hypertension associated with diabetes (HCC) Assessment & Plan: Chronic Well controlled.   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  Asymptomatic Recent platelets >100   Hyperlipidemia associated with type 2 diabetes mellitus (HCC) Assessment & Plan: Chronic Currently on Zocor 40 mg daily and tolerating well Recent LDL 82 Consider switching to Crestor in future as LDL has not declined much on current medication. 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 Osteopenia.  Low FRAX score.  On Vitamin D and Calcium supplements Mammogram up to date Recommend regular breast self breast exams Pneumonia series completed Shingles  completed Hep C completed Recommend Hep B vaccine series PHQ9/GAD screening completed  PDMP reviewed  Return in about 3 months (around 08/02/2023).  Dana Allan, MD

## 2023-05-03 NOTE — Patient Instructions (Addendum)
It was a pleasure meeting you today. Thank you for allowing me to take part in your health care.  Our goals for today as we discussed include:  Plan to review your chart If needing blood work will reach out to you    Foot exam at next visit  Follow up in 3 months    If you have any questions or concerns, please do not hesitate to call the office at [phone].  I look forward to our next visit and until then take care and stay safe.  Regards,   Dana Allan, MD   [facility]

## 2023-05-14 ENCOUNTER — Encounter: Payer: Self-pay | Admitting: Obstetrics and Gynecology

## 2023-05-18 ENCOUNTER — Other Ambulatory Visit: Payer: Self-pay | Admitting: Family Medicine

## 2023-05-18 ENCOUNTER — Encounter: Payer: Self-pay | Admitting: Family Medicine

## 2023-05-18 ENCOUNTER — Ambulatory Visit: Payer: Medicare Other | Admitting: Family Medicine

## 2023-05-18 ENCOUNTER — Ambulatory Visit (INDEPENDENT_AMBULATORY_CARE_PROVIDER_SITE_OTHER): Payer: Medicare Other | Admitting: Family Medicine

## 2023-05-18 VITALS — BP 136/58 | HR 65 | Temp 97.9°F | Resp 16 | Ht 64.0 in | Wt 218.2 lb

## 2023-05-18 DIAGNOSIS — R197 Diarrhea, unspecified: Secondary | ICD-10-CM | POA: Diagnosis not present

## 2023-05-18 LAB — COMPREHENSIVE METABOLIC PANEL
ALT: 13 U/L (ref 0–35)
AST: 14 U/L (ref 0–37)
Albumin: 4 g/dL (ref 3.5–5.2)
Alkaline Phosphatase: 50 U/L (ref 39–117)
BUN: 19 mg/dL (ref 6–23)
CO2: 27 mEq/L (ref 19–32)
Calcium: 10.1 mg/dL (ref 8.4–10.5)
Chloride: 103 mEq/L (ref 96–112)
Creatinine, Ser: 1.05 mg/dL (ref 0.40–1.20)
GFR: 51.57 mL/min — ABNORMAL LOW (ref >60.00)
Glucose, Bld: 179 mg/dL — ABNORMAL HIGH (ref 70–99)
Potassium: 4.3 mEq/L (ref 3.5–5.1)
Sodium: 139 mEq/L (ref 135–145)
Total Bilirubin: 0.5 mg/dL (ref 0.2–1.2)
Total Protein: 6.5 g/dL (ref 6.0–8.3)

## 2023-05-18 LAB — MAGNESIUM: Magnesium: 1.1 mg/dL — ABNORMAL LOW (ref 1.5–2.5)

## 2023-05-18 LAB — CBC
HCT: 40 % (ref 36.0–46.0)
Hemoglobin: 13.1 g/dL (ref 12.0–15.0)
MCHC: 32.8 g/dL (ref 30.0–36.0)
MCV: 97 fl (ref 78.0–100.0)
Platelets: 120 10*3/uL — ABNORMAL LOW (ref 150.0–400.0)
RBC: 4.12 Mil/uL (ref 3.87–5.11)
RDW: 13.9 % (ref 11.5–15.5)
WBC: 9.8 10*3/uL (ref 4.0–10.5)

## 2023-05-18 MED ORDER — MAGNESIUM OXIDE 400 MG PO CAPS
ORAL_CAPSULE | ORAL | 0 refills | Status: DC
Start: 2023-05-18 — End: 2023-09-12

## 2023-05-18 NOTE — Progress Notes (Unsigned)
SUBJECTIVE:   Chief Complaint  Patient presents with   Diarrhea    Last week Friday and lasted until Saturday every 5-10 mins   HPI Presents to clinic for acute visit  Concern for diarrhea  Was recently traveling on bus tour.  Had uncontrollable diarrhea for 1 day.  Episodes of watery diarrhea every 5- 10 mins for 24 hours.  Did not have any BM for 2-3 days after resolution of diarrhea on Saturday.  Ate potato soup Tues and had diarrhea after ingestion.  Has not had diarrhea since and stool now more loose but formed. Endorses increase in flatulence.  Denies any fevers, abdominal pain, bloody diarrhea.  Had some mild nausea, vomited NBNB emesis x 1. Endorses low BP 101/50-60   PERTINENT PMH / PSH: DM T2 HTN HLD GERD OBJECTIVE:  BP (!) 136/58   Pulse 65   Temp 97.9 F (36.6 C)   Resp 16   Ht 5\' 4"  (1.626 m)   Wt 218 lb 4 oz (99 kg)   SpO2 98%   BMI 37.46 kg/m    Physical Exam Vitals reviewed.  Constitutional:      General: She is not in acute distress.    Appearance: Normal appearance. She is normal weight. She is not ill-appearing, toxic-appearing or diaphoretic.  HENT:     Mouth/Throat:     Mouth: Mucous membranes are moist.  Eyes:     General:        Right eye: No discharge.        Left eye: No discharge.     Conjunctiva/sclera: Conjunctivae normal.  Cardiovascular:     Rate and Rhythm: Normal rate and regular rhythm.     Heart sounds: Normal heart sounds.  Pulmonary:     Effort: Pulmonary effort is normal.     Breath sounds: Normal breath sounds.  Abdominal:     General: Bowel sounds are normal. There is no distension.     Palpations: There is no mass.     Tenderness: There is no abdominal tenderness. There is no right CVA tenderness, left CVA tenderness, guarding or rebound.  Musculoskeletal:        General: Normal range of motion.  Skin:    General: Skin is warm and dry.  Neurological:     General: No focal deficit present.     Mental Status: She  is alert and oriented to person, place, and time. Mental status is at baseline.  Psychiatric:        Mood and Affect: Mood normal.        Behavior: Behavior normal.        Thought Content: Thought content normal.        Judgment: Judgment normal.        05/18/2023    7:58 AM 05/03/2023    9:34 AM 04/10/2023   11:12 AM 01/17/2023    1:24 PM 11/27/2022    8:36 AM  Depression screen PHQ 2/9  Decreased Interest 0 0 0 0 0  Down, Depressed, Hopeless 0 0 0 0 0  PHQ - 2 Score 0 0 0 0 0  Altered sleeping 0 0  1   Tired, decreased energy 0 0  0   Change in appetite 0 0  0   Feeling bad or failure about yourself  0 0  0   Trouble concentrating 0 0  0   Moving slowly or fidgety/restless 0 0  0   Suicidal thoughts 0 0  0  PHQ-9 Score 0 0  1   Difficult doing work/chores Not difficult at all Not difficult at all  Not difficult at all       05/18/2023    7:58 AM 05/03/2023    9:34 AM  GAD 7 : Generalized Anxiety Score  Nervous, Anxious, on Edge 0 0  Control/stop worrying 0 0  Worry too much - different things 0 0  Trouble relaxing 0 0  Restless 0 0  Easily annoyed or irritable 0 0  Afraid - awful might happen 0 0  Total GAD 7 Score 0 0  Anxiety Difficulty Not difficult at all Not difficult at all    ASSESSMENT/PLAN:  Diarrhea, unspecified type Assessment & Plan: Suspect ingested food as causative source given no bloody stool or fevers.  No acute abdomen on exam Check electrolytes and replete if indicated Check stool   Orders: -     CBC -     Comprehensive metabolic panel -     Magnesium -     Cdiff NAA+O+P+Stool Culture; Future  Hypomagnesemia Assessment & Plan: Mag 1.1 Likely from recent diarrhea that is now improving No cardiac symptoms Start Magnesium Oxide 400 mg daily.  Patient called and informed of results and management Strict ED precautions provided    PDMP reviewed  Return if symptoms worsen or fail to improve, for PCP.  Dana Allan, MD

## 2023-05-18 NOTE — Patient Instructions (Signed)
It was a pleasure meeting you today. Thank you for allowing me to take part in your health care.  Our goals for today as we discussed include:  I think this diarrhea is from something you ate Thursday night. I'm glad you are feeling a little better and stool is starting to return to normal  Continue bland diet and increase as tolerated.  We will get some labs today.  If they are abnormal or we need to do something about them, I will call you.  If they are normal, I will send you a message on MyChart (if it is active) or a letter in the mail.  If you don't hear from [place] in 2 weeks, please call the office at the number below.   Will get stool samples.  When you are to provide sample please take them to the [facility] lab  If you have any worsening symptoms, are unable to stay hydrated please go to Urgent care.   If you have any questions or concerns, please do not hesitate to call the office at [phone].  I look forward to our next visit and until then take care and stay safe.  Regards,   Dana Allan, MD   [facility]

## 2023-05-19 ENCOUNTER — Encounter: Payer: Self-pay | Admitting: Family Medicine

## 2023-05-19 DIAGNOSIS — N3281 Overactive bladder: Secondary | ICD-10-CM | POA: Insufficient documentation

## 2023-05-19 NOTE — Assessment & Plan Note (Signed)
Chronic Rate controlled on Metoprolol XL 12.5 mg BID Not currently on anticoagulation Takes ASA low dose Follows with Cardiology

## 2023-05-19 NOTE — Assessment & Plan Note (Signed)
Chronic Currently on Zocor 40 mg daily and tolerating well Recent LDL 82 Consider switching to Crestor in future as LDL has not declined much on current medication. Encourage modification of diet and increasing low glycemic foods Lipids annually

## 2023-05-19 NOTE — Assessment & Plan Note (Signed)
Chronic Currently on Myrbetriq 25 mg daily Follows with Urogyn

## 2023-05-19 NOTE — Assessment & Plan Note (Signed)
Chronic Recent A1c 7.8. Goal for age 76.0-7.5 Continue Metformin XR 500 mg BID Continue Farxiga 10 mg daily Eye exam with Dr Alvester Morin Foot exam at next visit UACR/ A1c at next visit On statin/ACEi

## 2023-05-19 NOTE — Assessment & Plan Note (Signed)
Elevated BMI  Treated for DM T2, HTN, HLD Previously tried Mounjaro but unable to tolerate due to GI side effects Encouraged portion control and healthy nutrition

## 2023-05-19 NOTE — Assessment & Plan Note (Addendum)
Chronic.  Asymptomatic Recent platelets >100

## 2023-05-19 NOTE — Assessment & Plan Note (Signed)
Chronic Well controlled.   Continue hydrochlorothiazide 12.5 mg daily Continue Zestril 20 mg daily Continue Metoprolol XL 12.5 mg Cmet at next visit

## 2023-05-19 NOTE — Assessment & Plan Note (Signed)
Follows with Dr Florian Buff, urogyn

## 2023-05-20 ENCOUNTER — Encounter: Payer: Self-pay | Admitting: Family Medicine

## 2023-05-20 DIAGNOSIS — R197 Diarrhea, unspecified: Secondary | ICD-10-CM | POA: Insufficient documentation

## 2023-05-20 NOTE — Assessment & Plan Note (Signed)
Mag 1.1 Likely from recent diarrhea that is now improving No cardiac symptoms Start Magnesium Oxide 400 mg daily.  Patient called and informed of results and management Strict ED precautions provided

## 2023-05-20 NOTE — Assessment & Plan Note (Signed)
Suspect ingested food as causative source given no bloody stool or fevers.  No acute abdomen on exam Check electrolytes and replete if indicated Check stool

## 2023-05-21 ENCOUNTER — Encounter: Payer: Self-pay | Admitting: Family Medicine

## 2023-05-21 ENCOUNTER — Other Ambulatory Visit
Admission: RE | Admit: 2023-05-21 | Discharge: 2023-05-21 | Disposition: A | Discharge status: Home / Self Care | Payer: Medicare Other | Source: Ambulatory Visit | Attending: Family Medicine | Admitting: Family Medicine

## 2023-05-21 ENCOUNTER — Other Ambulatory Visit: Payer: Self-pay | Admitting: *Deleted

## 2023-05-21 DIAGNOSIS — R197 Diarrhea, unspecified: Secondary | ICD-10-CM

## 2023-05-21 LAB — GASTROINTESTINAL PANEL BY PCR, STOOL (REPLACES STOOL CULTURE)

## 2023-05-21 LAB — C DIFFICILE QUICK SCREEN W PCR REFLEX
C Diff antigen: NEGATIVE
C Diff interpretation: NOT DETECTED
C Diff toxin: NEGATIVE

## 2023-05-21 NOTE — Addendum Note (Signed)
Addended by: Warden Fillers on: 05/21/2023 10:19 AM   Modules accepted: Orders

## 2023-05-23 ENCOUNTER — Ambulatory Visit: Payer: Medicare Other | Admitting: Dermatology

## 2023-05-23 ENCOUNTER — Encounter: Payer: Self-pay | Admitting: Dermatology

## 2023-05-23 DIAGNOSIS — D1801 Hemangioma of skin and subcutaneous tissue: Secondary | ICD-10-CM

## 2023-05-23 DIAGNOSIS — I781 Nevus, non-neoplastic: Secondary | ICD-10-CM

## 2023-05-23 DIAGNOSIS — Z1283 Encounter for screening for malignant neoplasm of skin: Secondary | ICD-10-CM | POA: Diagnosis not present

## 2023-05-23 DIAGNOSIS — W908XXA Exposure to other nonionizing radiation, initial encounter: Secondary | ICD-10-CM | POA: Diagnosis not present

## 2023-05-23 DIAGNOSIS — I8393 Asymptomatic varicose veins of bilateral lower extremities: Secondary | ICD-10-CM

## 2023-05-23 DIAGNOSIS — Z85828 Personal history of other malignant neoplasm of skin: Secondary | ICD-10-CM

## 2023-05-23 DIAGNOSIS — D1721 Benign lipomatous neoplasm of skin and subcutaneous tissue of right arm: Secondary | ICD-10-CM | POA: Diagnosis not present

## 2023-05-23 DIAGNOSIS — L814 Other melanin hyperpigmentation: Secondary | ICD-10-CM

## 2023-05-23 DIAGNOSIS — L578 Other skin changes due to chronic exposure to nonionizing radiation: Secondary | ICD-10-CM | POA: Diagnosis not present

## 2023-05-23 DIAGNOSIS — D229 Melanocytic nevi, unspecified: Secondary | ICD-10-CM

## 2023-05-23 DIAGNOSIS — L821 Other seborrheic keratosis: Secondary | ICD-10-CM

## 2023-05-23 NOTE — Progress Notes (Signed)
   Follow-Up Visit   Subjective  Stacie Porter is a 77 y.o. female who presents for the following: Skin Cancer Screening and Full Body Skin Exam Red mole at left abdominal fold  The patient presents for Total-Body Skin Exam (TBSE) for skin cancer screening and mole check. The patient has spots, moles and lesions to be evaluated, some may be new or changing and the patient may have concern these could be cancer.    The following portions of the chart were reviewed this encounter and updated as appropriate: medications, allergies, medical history  Review of Systems:  No other skin or systemic complaints except as noted in HPI or Assessment and Plan.  Objective  Well appearing patient in no apparent distress; mood and affect are within normal limits.  A full examination was performed including scalp, head, eyes, ears, nose, lips, neck, chest, axillae, abdomen, back, buttocks, bilateral upper extremities, bilateral lower extremities, hands, feet, fingers, toes, fingernails, and toenails. All findings within normal limits unless otherwise noted below.   Relevant physical exam findings are noted in the Assessment and Plan.    Assessment & Plan   SKIN CANCER SCREENING PERFORMED TODAY.  ACTINIC DAMAGE - Chronic condition, secondary to cumulative UV/sun exposure - diffuse scaly erythematous macules with underlying dyspigmentation - Recommend daily broad spectrum sunscreen SPF 30+ to sun-exposed areas, reapply every 2 hours as needed.  - Staying in the shade or wearing long sleeves, sun glasses (UVA+UVB protection) and wide brim hats (4-inch brim around the entire circumference of the hat) are also recommended for sun protection.  - Call for new or changing lesions.  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 Larger vessels may be covered if symptomatic and we would refer to vascular surgeon if treatment desired.  Raj Janus purpura  MELANOCYTIC NEVI - Tan-brown and/or pink-flesh-colored symmetric macules and papules - Benign appearing on exam today - Observation - Call clinic for new or changing moles - Recommend daily use of broad spectrum spf 30+ sunscreen to sun-exposed areas.   Lipoma of right upper extremity R post shoulder  Exam: Rubbery nodule 4.5 x 2.5cm   Benign-appearing.  Observation.  Call clinic for new or changing lesions.  Discussed treatment option for excision and would especially recommend this if symptoms were significant change.      HISTORY OF BASAL CELL CARCINOMA OF THE SKIN Nose 2014 Mohs  - No evidence of recurrence today - Recommend regular full body skin exams - Recommend daily broad spectrum sunscreen SPF 30+ to sun-exposed areas, reapply every 2 hours as needed.  - Call if any new or changing lesions are noted between office visits   Return in about 1 year (around 05/22/2024) for TBSE.  IAsher Muir, CMA, am acting as scribe for Armida Sans, MD.   Documentation: I have reviewed the above documentation for accuracy and completeness, and I agree with the above.  Armida Sans, MD

## 2023-05-23 NOTE — Patient Instructions (Signed)

## 2023-05-28 ENCOUNTER — Encounter: Payer: Self-pay | Admitting: Family Medicine

## 2023-07-12 ENCOUNTER — Encounter: Payer: Self-pay | Admitting: Obstetrics and Gynecology

## 2023-07-19 ENCOUNTER — Encounter: Payer: Self-pay | Admitting: Obstetrics and Gynecology

## 2023-07-19 ENCOUNTER — Ambulatory Visit: Payer: Medicare Other | Admitting: Obstetrics and Gynecology

## 2023-07-19 VITALS — BP 184/72 | HR 60

## 2023-07-19 DIAGNOSIS — R350 Frequency of micturition: Secondary | ICD-10-CM

## 2023-07-19 DIAGNOSIS — N393 Stress incontinence (female) (male): Secondary | ICD-10-CM | POA: Diagnosis not present

## 2023-07-19 LAB — POCT URINALYSIS DIPSTICK
Bilirubin, UA: NEGATIVE
Blood, UA: NEGATIVE
Glucose, UA: POSITIVE — AB
Ketones, UA: NEGATIVE
Leukocytes, UA: NEGATIVE
Nitrite, UA: NEGATIVE
Protein, UA: NEGATIVE
Spec Grav, UA: 1.01 (ref 1.010–1.025)
Urobilinogen, UA: 0.2 U/dL
pH, UA: 7 (ref 5.0–8.0)

## 2023-07-19 MED ORDER — LIDOCAINE HCL URETHRAL/MUCOSAL 2 % EX GEL
1.0000 | Freq: Once | CUTANEOUS | Status: AC
  Administered 2023-07-19: 1 via URETHRAL

## 2023-07-19 MED ORDER — LIDOCAINE HCL URETHRAL/MUCOSAL 2 % EX GEL
1.0000 | Freq: Once | CUTANEOUS | Status: DC
Start: 2023-07-19 — End: 2023-07-19

## 2023-07-19 MED ORDER — CIPROFLOXACIN HCL 500 MG PO TABS
500.0000 mg | ORAL_TABLET | Freq: Once | ORAL | Status: AC
Start: 2023-07-19 — End: 2023-07-19
  Administered 2023-07-19: 500 mg via ORAL

## 2023-07-19 MED ORDER — LIDOCAINE-EPINEPHRINE 1 %-1:100000 IJ SOLN
6.0000 mL | Freq: Once | INTRAMUSCULAR | Status: AC
Start: 2023-07-19 — End: 2023-07-19
  Administered 2023-07-19: 6 mL

## 2023-07-19 NOTE — Progress Notes (Signed)
Bulkamid Injection  CC: 77 y.o. y.o. F with stress incontinence who presents for transurethral Bulkamid injection.  Patient signed her consent form.  She started antibiotic prophylaxis today (Ciprofloxacin).  Today's Vitals   07/19/23 0805  BP: (!) 184/72  Pulse: 60    Results for orders placed or performed in visit on 07/19/23 (from the past 24 hour(s))  POCT Urinalysis Dipstick     Status: Abnormal   Collection Time: 07/19/23  8:27 AM  Result Value Ref Range   Color, UA Yellow    Clarity, UA Clear    Glucose, UA Positive (A) Negative   Bilirubin, UA Negative    Ketones, UA Negative    Spec Grav, UA 1.010 1.010 - 1.025   Blood, UA Negative    pH, UA 7.0 5.0 - 8.0   Protein, UA Negative Negative   Urobilinogen, UA 0.2 0.2 or 1.0 E.U./dL   Nitrite, UA Negative    Leukocytes, UA Negative Negative   Appearance     Odor      Procedure: Time out was performed. The bladder was catheterized and 10 ml of 2% lidocaine jelly placed in the urethra. A urethral block was performed by injecting 3ml of 1% lidocaine with epinephrine at 3 and 9 o'clock adjacent to the urethra.  The Bulkamid needle was primed.  The cystoscope was inserted to the level of the bladder neck.  The needle was inserted 2 cm and the scope was pulled back into the urethra 2 cm.  The needle was inserted bevel up at the 5 o'clock position and the Bulkamid was injected to obtain coaptation.  This was repeated at the 2 o'clock,  10 o'clock and 7 o'clock positions.   A total of 1.7 ml syringes of Bulkamid were used and good circumferential coaptation was noted.  The patient tolerated the procedure well. She was asked to void after the procedure.  Post-Void Residual (PVR) by Bladder Scan: In order to evaluate bladder emptying, we discussed obtaining a postvoid residual and she agreed to this procedure.  Procedure: The ultrasound unit was placed on the patient's abdomen in the suprapubic region.   Post Void Residual - 07/19/23  0903       Post Void Residual   Post Void Residual 56 mL             ASSESSMENT: 77 y.o. y.o. s/p transurethral Bulkamid injection for stress incontinence.   PLAN: Patient will follow up in 4 weeks to reassess. Voiding and post-procedure precautions were given. She will return for heavy bleeding, fevers, dysuria lasting beyond today and incomplete emptying.  All questions were answered.  Marguerita Beards, MD

## 2023-07-19 NOTE — Addendum Note (Signed)
Addended by: Salina April on: 07/19/2023 12:40 PM   Modules accepted: Orders

## 2023-07-26 ENCOUNTER — Other Ambulatory Visit: Payer: Self-pay | Admitting: Family Medicine

## 2023-07-26 DIAGNOSIS — E1165 Type 2 diabetes mellitus with hyperglycemia: Secondary | ICD-10-CM

## 2023-07-26 DIAGNOSIS — K2100 Gastro-esophageal reflux disease with esophagitis, without bleeding: Secondary | ICD-10-CM

## 2023-07-30 NOTE — Telephone Encounter (Signed)
Requested by interface surescripts. Provider not at this practice.  Requested Prescriptions  Refused Prescriptions Disp Refills   metFORMIN (GLUCOPHAGE-XR) 500 MG 24 hr tablet [Pharmacy Med Name: METFORMIN HCL ER 500 MG TABLET] 180 tablet 3    Sig: TAKE 1 TABLET BY MOUTH 2 TIMES DAILY WITH A MEAL.     Endocrinology:  Diabetes - Biguanides Failed - 07/26/2023  2:26 AM      Failed - eGFR in normal range and within 360 days    EGFR (African American)  Date Value Ref Range Status  12/03/2014 >60  Final   GFR calc Af Amer  Date Value Ref Range Status  11/04/2019 82 >59 mL/min/1.73 Final   EGFR (Non-African Amer.)  Date Value Ref Range Status  12/03/2014 >60  Final    Comment:    eGFR values <3mL/min/1.73 m2 may be an indication of chronic kidney disease (CKD). Calculated eGFR is useful in patients with stable renal function. The eGFR calculation will not be reliable in acutely ill patients when serum creatinine is changing rapidly. It is not useful in patients on dialysis. The eGFR calculation may not be applicable to patients at the low and high extremes of body sizes, pregnant women, and vegetarians.    GFR, Estimated  Date Value Ref Range Status  02/20/2023 59 (L) >60 mL/min Final    Comment:    (NOTE) Calculated using the CKD-EPI Creatinine Equation (2021)    GFR  Date Value Ref Range Status  05/18/2023 51.57 (L) >60.00 mL/min Final    Comment:    Calculated using the CKD-EPI Creatinine Equation (2021)   eGFR  Date Value Ref Range Status  04/16/2023 57 (L) >59 mL/min/1.73 Final         Failed - B12 Level in normal range and within 720 days    No results found for: "VITAMINB12"       Passed - Cr in normal range and within 360 days    Creatinine  Date Value Ref Range Status  12/03/2014 0.65 mg/dL Final    Comment:    1.61-0.96 NOTE: New Reference Range  11/03/14    Creatinine, Ser  Date Value Ref Range Status  05/18/2023 1.05 0.40 - 1.20 mg/dL Final          Passed - HBA1C is between 0 and 7.9 and within 180 days    Hgb A1c MFr Bld  Date Value Ref Range Status  02/05/2023 7.8 (H) 4.8 - 5.6 % Final    Comment:             Prediabetes: 5.7 - 6.4          Diabetes: >6.4          Glycemic control for adults with diabetes: <7.0          Passed - Valid encounter within last 6 months    Recent Outpatient Visits           3 months ago Nausea   [place] [facility] [place], Marzella Schlein, MD   3 months ago Type 2 diabetes mellitus with hyperglycemia, without long-term current use of insulin [facility])   [place] [facility] Jacky Kindle, FNP   4 months ago Hospital discharge follow-up   [facility] Jacky [facility], FNP   5 months ago Post-menopausal   [facility] Merita Norton T, FNP   6 months ago Type 2 diabetes mellitus with hyperglycemia, without long-term current use  of insulin [facility])   [place] [facility] Merita Norton T, FNP       Future Appointments             In 3 days Dana Allan, MD [facility] at [place], [place]   In 2 weeks Marguerita Beards, MD [facility], [facility]   In 9 months Deirdre Evener, MD [place] [facility]            Passed - CBC within normal limits and completed in the last 12 months    WBC  Date Value Ref Range Status  05/18/2023 9.8 4.0 - 10.5 K/uL Final   RBC  Date Value Ref Range Status  05/18/2023 4.12 3.87 - 5.11 Mil/uL Final   Hemoglobin  Date Value Ref Range Status  05/18/2023 13.1 12.0 - 15.0 g/dL Final  16/05/9603 54.0 11.1 - 15.9 g/dL Final   HCT  Date Value Ref Range Status  05/18/2023 40.0 36.0 - 46.0 % Final   Hematocrit  Date Value Ref Range Status  04/16/2023 39.8 34.0 - 46.6 % Final   MCHC  Date Value Ref Range Status  05/18/2023 32.8 30.0 - 36.0 g/dL Final   [facility]  Date Value Ref Range  Status  04/16/2023 31.2 26.6 - 33.0 pg Final  02/20/2023 32.4 26.0 - 34.0 pg Final   MCV  Date Value Ref Range Status  05/18/2023 97.0 78.0 - 100.0 fl Final  04/16/2023 95 79 - 97 fL Final  12/03/2014 95 80 - 100 fL Final   No results found for: "PLTCOUNTKUC", "LABPLAT", "POCPLA" RDW  Date Value Ref Range Status  05/18/2023 13.9 11.5 - 15.5 % Final  04/16/2023 13.2 11.7 - 15.4 % Final  12/03/2014 13.0 11.5 - 14.5 % Final          omeprazole (PRILOSEC) 20 MG capsule [Pharmacy Med Name: OMEPRAZOLE DR 20 MG CAPSULE] 90 capsule 3    Sig: TAKE 1 CAPSULE BY MOUTH EVERY DAY     Gastroenterology: Proton Pump Inhibitors Passed - 07/26/2023  2:26 AM      Passed - Valid encounter within last 12 months    Recent Outpatient Visits           3 months ago Nausea   [place] [facility], Marzella Schlein, MD   3 months ago Type 2 diabetes mellitus with hyperglycemia, without long-term current use of insulin [facility])   [facility] Jacky Kindle, FNP   4 months ago Hospital discharge follow-up   [facility], FNP   5 months ago Post-menopausal   [facility] Merita Norton T, FNP   6 months ago Type 2 diabetes mellitus with hyperglycemia, without long-term current use of insulin [facility])   [facility] Jacky Kindle, FNP       Future Appointments             In 3 days Dana Allan, MD [facility] [place], [facility]   In 2 weeks Marguerita Beards, MD [facility], [facility]   In 9 months Deirdre Evener, MD [place] [facility]             FARXIGA 10 MG TABS tablet [Pharmacy Med Name: FARXIGA 10 MG TABLET] 90 tablet 3    Sig: TAKE 1 TABLET  BY MOUTH DAILY BEFORE BREAKFAST.     Endocrinology:  Diabetes - SGLT2 Inhibitors Failed - 07/26/2023  2:26 AM      Failed - eGFR in  normal range and within 360 days    EGFR (African American)  Date Value Ref Range Status  12/03/2014 >60  Final   GFR calc Af Amer  Date Value Ref Range Status  11/04/2019 82 >59 mL/min/1.73 Final   EGFR (Non-African Amer.)  Date Value Ref Range Status  12/03/2014 >60  Final    Comment:    eGFR values <52mL/min/1.73 m2 may be an indication of chronic kidney disease (CKD). Calculated eGFR is useful in patients with stable renal function. The eGFR calculation will not be reliable in acutely ill patients when serum creatinine is changing rapidly. It is not useful in patients on dialysis. The eGFR calculation may not be applicable to patients at the low and high extremes of body sizes, pregnant women, and vegetarians.    GFR, Estimated  Date Value Ref Range Status  02/20/2023 59 (L) >60 mL/min Final    Comment:    (NOTE) Calculated using the CKD-EPI Creatinine Equation (2021)    GFR  Date Value Ref Range Status  05/18/2023 51.57 (L) >60.00 mL/min Final    Comment:    Calculated using the CKD-EPI Creatinine Equation (2021)   eGFR  Date Value Ref Range Status  04/16/2023 57 (L) >59 mL/min/1.73 Final         Passed - Cr in normal range and within 360 days    Creatinine  Date Value Ref Range Status  12/03/2014 0.65 mg/dL Final    Comment:    4.25-9.56 NOTE: New Reference Range  11/03/14    Creatinine, Ser  Date Value Ref Range Status  05/18/2023 1.05 0.40 - 1.20 mg/dL Final         Passed - HBA1C is between 0 and 7.9 and within 180 days    Hgb A1c MFr Bld  Date Value Ref Range Status  02/05/2023 7.8 (H) 4.8 - 5.6 % Final    Comment:             Prediabetes: 5.7 - 6.4          Diabetes: >6.4          Glycemic control for adults with diabetes: <7.0          Passed - Valid encounter within last 6 months    Recent Outpatient Visits           3 months ago Nausea   [facility], Marzella Schlein, MD   3 months ago Type 2  diabetes mellitus with hyperglycemia, without long-term current use of insulin [facility])   [facility] Kindle, FNP   4 months ago Hospital discharge follow-up   [facility] Jacky Kindle, FNP   5 months ago Post-menopausal   [facility] Merita Norton T, FNP   6 months ago Type 2 diabetes mellitus with hyperglycemia, without long-term current use of insulin [facility])   [facility] Jacky Kindle, FNP       Future Appointments             In 3 days Dana Allan, MD [facility] at [place], [facility]   In 2 weeks Florian Buff, Rochel Brome, MD [facility] at [facility], [facility]   In 9 months Gwen Pounds,  Dineen Kid, MD [place] [facility]             metoprolol tartrate (LOPRESSOR) 25 MG tablet [Pharmacy Med Name: METOPROLOL TARTRATE 25 MG TAB] 90 tablet 3    Sig: TAKE 0.5 TABLETS BY MOUTH 2 TIMES DAILY.     Cardiovascular:  Beta Blockers Failed - 07/26/2023  2:26 AM      Failed - Last BP in normal range    BP Readings from Last 1 Encounters:  07/19/23 (!) 184/72         Passed - Last Heart Rate in normal range    Pulse Readings from Last 1 Encounters:  07/19/23 60         Passed - Valid encounter within last 6 months    Recent Outpatient Visits           3 months ago Nausea   [place] [facility], Marzella Schlein, MD   3 months ago Type 2 diabetes mellitus with hyperglycemia, without long-term current use of insulin [facility])   [place] Jacky Kindle, FNP   4 months ago Hospital discharge follow-up   [facility] Jacky Kindle, FNP   5 months ago Post-menopausal   [facility] Mer[facility] Norton T, FNP   6 months ago Type 2 diabetes mellitus with hyperglycemia, without long-term current use of insulin [facility])   [facility] Jacky Kindle, FNP       Future Appointments             In 3 days Dana Allan, MD [facility] [place], [facility]   In 2 weeks Florian Buff, Rochel Brome, MD [facility] at [facility], [facility]   In 9 months Deirdre Evener, MD [facility]

## 2023-08-02 ENCOUNTER — Ambulatory Visit (INDEPENDENT_AMBULATORY_CARE_PROVIDER_SITE_OTHER): Payer: Medicare Other | Admitting: Family Medicine

## 2023-08-02 ENCOUNTER — Encounter: Payer: Self-pay | Admitting: Family Medicine

## 2023-08-02 VITALS — BP 134/64 | HR 62 | Temp 98.2°F | Resp 18 | Ht 64.0 in | Wt 217.0 lb

## 2023-08-02 DIAGNOSIS — K2100 Gastro-esophageal reflux disease with esophagitis, without bleeding: Secondary | ICD-10-CM

## 2023-08-02 DIAGNOSIS — R0982 Postnasal drip: Secondary | ICD-10-CM | POA: Diagnosis not present

## 2023-08-02 DIAGNOSIS — E1165 Type 2 diabetes mellitus with hyperglycemia: Secondary | ICD-10-CM

## 2023-08-02 DIAGNOSIS — Z7984 Long term (current) use of oral hypoglycemic drugs: Secondary | ICD-10-CM

## 2023-08-02 DIAGNOSIS — M659 Unspecified synovitis and tenosynovitis, unspecified site: Secondary | ICD-10-CM | POA: Diagnosis not present

## 2023-08-02 DIAGNOSIS — E118 Type 2 diabetes mellitus with unspecified complications: Secondary | ICD-10-CM

## 2023-08-02 MED ORDER — OMEPRAZOLE 20 MG PO CPDR: 20.0000 mg | DELAYED_RELEASE_CAPSULE | Freq: Every day | ORAL | 3 refills | Status: DC

## 2023-08-02 MED ORDER — METFORMIN HCL ER 500 MG PO TB24: 500.0000 mg | ORAL_TABLET | Freq: Two times a day (BID) | ORAL | 3 refills | Status: DC

## 2023-08-02 MED ORDER — DAPAGLIFLOZIN PROPANEDIOL 10 MG PO TABS: 10.0000 mg | ORAL_TABLET | Freq: Every day | ORAL | Status: DC

## 2023-08-02 MED ORDER — PREDNISONE 20 MG PO TABS: 20.0000 mg | ORAL_TABLET | Freq: Every day | ORAL | 0 refills | Status: AC

## 2023-08-02 MED ORDER — GLIPIZIDE 5 MG PO TABS: 5.0000 mg | ORAL_TABLET | Freq: Every day | ORAL | 0 refills | Status: DC

## 2023-08-02 MED ORDER — DAPAGLIFLOZIN PROPANEDIOL 10 MG PO TABS: 10.0000 mg | ORAL_TABLET | Freq: Every day | ORAL | 3 refills | Status: DC

## 2023-08-02 MED ORDER — METFORMIN HCL ER 500 MG PO TB24: 500.0000 mg | ORAL_TABLET | Freq: Two times a day (BID) | ORAL | Status: DC

## 2023-08-02 NOTE — Patient Instructions (Addendum)
It was a pleasure meeting you today. Thank you for allowing me to take part in your health care.  Our goals for today as we discussed include:  Take Prednisone 20 mg daily for 5 days Take Glipizide 5 mg daily for 5 days Monitor blood sugars.  If > 400 notify MD Please send MyChart message in couple of days to update me on progress  Refills sent for requested medication  This is a list of the screening recommended for you and due dates:  Health Maintenance  Topic Date Due   COVID-19 Vaccine (3 - Moderna risk series) 12/04/2019   Complete foot exam   05/30/2023   Hemoglobin A1C  08/07/2023   Eye exam for diabetics  10/11/2023   Medicare Annual Wellness Visit  11/27/2023   Yearly kidney health urinalysis for diabetes  02/05/2024   Yearly kidney function blood test for diabetes  05/17/2024   DTaP/Tdap/Td vaccine (5 - Td or Tdap) 09/23/2024   Colon Cancer Screening  02/12/2028   DEXA scan (bone density measurement)  04/16/2033   Pneumonia Vaccine  Completed   Flu Shot  Completed   Hepatitis C Screening  Completed   Zoster (Shingles) Vaccine  Completed   HPV Vaccine  Aged Out     Follow up in 1 month  If you have any questions or concerns, please do not hesitate to call the office at [phone].  I look forward to our next visit and until then take care and stay safe.  Regards,   Dana Allan, MD   [facility]

## 2023-08-02 NOTE — Progress Notes (Signed)
SUBJECTIVE:   Chief Complaint  Patient presents with   Shoulder Pain    Right shoulder X 2 weeks tired rub and heat hard to raise arm up    Diabetes    3 Month follow up    HPI Presents to clinic for right arm pain.  Discussed the use of AI scribe software for clinical note transcription with the patient, who gave verbal consent to proceed.  History of Present Illness The patient, with a history of diabetes, presented with a two-week history of right arm pain. The pain was described as intermittent, with periods of relief and exacerbation. The patient reported difficulty in raising the arm due to the pain, but denied any associated numbness, tingling, or weakness. The patient had tried heat, ice, arthritis rub, and extra strength Tylenol for symptom management, with variable relief. There was no known injury or overuse that could have precipitated the pain. The patient denied any history of arthritis, fevers, or swelling in the arm.  In addition to the arm pain, the patient also reported a recent cough. The cough was not associated with any nasal symptoms and the patient had tried Mucinex and Delsym for symptom relief, but discontinued due to adverse reactions.  The patient was on Farxiga and metformin for diabetes management. The patient also reported a history of carpal tunnel syndrome.    PERTINENT PMH / PSH: As above  OBJECTIVE:  BP 134/64   Pulse 62   Temp 98.2 F (36.8 C)   Resp 18   Ht 5\' 4"  (1.626 m)   Wt 217 lb (98.4 kg)   SpO2 99%   BMI 37.25 kg/m    Physical Exam Vitals reviewed.  Constitutional:      General: She is not in acute distress.    Appearance: Normal appearance. She is obese. She is not ill-appearing, toxic-appearing or diaphoretic.  Eyes:     General:        Right eye: No discharge.        Left eye: No discharge.     Conjunctiva/sclera: Conjunctivae normal.  Cardiovascular:     Rate and Rhythm: Normal rate and regular rhythm.     Heart  sounds: Normal heart sounds.  Pulmonary:     Effort: Pulmonary effort is normal.     Breath sounds: Normal breath sounds.  Abdominal:     General: Bowel sounds are normal.  Musculoskeletal:        General: Normal range of motion.     Right upper arm: Tenderness present. No swelling, deformity, lacerations or bony tenderness.     Left upper arm: Normal.  Skin:    General: Skin is warm and dry.  Neurological:     General: No focal deficit present.     Mental Status: She is alert and oriented to person, place, and time. Mental status is at baseline.  Psychiatric:        Mood and Affect: Mood normal.        Behavior: Behavior normal.        Thought Content: Thought content normal.        Judgment: Judgment normal.        08/02/2023    8:51 AM 05/18/2023    7:58 AM 05/03/2023    9:34 AM 04/10/2023   11:12 AM 01/17/2023    1:24 PM  Depression screen PHQ 2/9  Decreased Interest 0 0 0 0 0  Down, Depressed, Hopeless 0 0 0 0 0  PHQ -  2 Score 0 0 0 0 0  Altered sleeping 0 0 0  1  Tired, decreased energy 0 0 0  0  Change in appetite 0 0 0  0  Feeling bad or failure about yourself  0 0 0  0  Trouble concentrating 0 0 0  0  Moving slowly or fidgety/restless 0 0 0  0  Suicidal thoughts 0 0 0  0  PHQ-9 Score 0 0 0  1  Difficult doing work/chores Not difficult at all Not difficult at all Not difficult at all  Not difficult at all      08/02/2023    8:51 AM 05/18/2023    7:58 AM 05/03/2023    9:34 AM  GAD 7 : Generalized Anxiety Score  Nervous, Anxious, on Edge 0 0 0  Control/stop worrying 0 0 0  Worry too much - different things 0 0 0  Trouble relaxing 0 0 0  Restless 0 0 0  Easily annoyed or irritable 0 0 0  Afraid - awful might happen 0 0 0  Total GAD 7 Score 0 0 0  Anxiety Difficulty Not difficult at all Not difficult at all Not difficult at all    ASSESSMENT/PLAN:  Tenosynovitis Assessment & Plan: Acute onset of pain in the right arm for the past two weeks, possibly related  to overuse or minor injury. No associated numbness, tingling, or weakness. Pain is intermittent and partially relieved by heat, cold, arthritis rub, and extra strength Tylenol. Physical examination suggests possible bicep tendonitis. -Start a short course of Prednisone 20mg  for 5 days to reduce inflammation. -Add Glipizide to counteract potential hyperglycemia from Prednisone. -Plan for imaging if no improvement.  Orders: -     predniSONE; Take 1 tablet (20 mg total) by mouth daily with breakfast for 5 days.  Dispense: 5 tablet; Refill: 0 -     glipiZIDE; Take 1 tablet (5 mg total) by mouth daily before breakfast for 5 days.  Dispense: 5 tablet; Refill: 0  Gastroesophageal reflux disease with esophagitis without hemorrhage -     Omeprazole; Take 1 capsule (20 mg total) by mouth daily.  Dispense: 90 capsule; Refill: 3  Type 2 diabetes mellitus with complications [facility]) Assessment & Plan: On Farxiga and Metformin. Plan to add a short course of Glipizide to counteract potential hyperglycemia from Prednisone. -Continue Farxiga and Metformin. -Add Glipizide during the course of Prednisone. -Monitor blood sugars during the course of Prednisone and Glipizide. -Plan for A1C check in January.  Orders: -     Dapagliflozin Propanediol; Take 1 tablet (10 mg total) by mouth daily before breakfast.  Dispense: 90 tablet; Refill: 3 -     metFORMIN HCl ER; Take 1 tablet (500 mg total) by mouth 2 (two) times daily with a meal.  Dispense: 180 tablet; Refill: 3  Postnasal drip Assessment & Plan: Intermittent cough, possibly related to postnasal drip or allergies. No associated fever, chest pain, shortness of breath, or abnormal lung sounds on examination. -Advise use of honey for symptomatic relief. -Consider use of nasal saline, humidified air, or over-the-counter Flonase for potential postnasal drip.    PDMP reviewed  Return in about 1 month (around 09/02/2023) for PCP, DM, HTN.  Dana Allan, MD

## 2023-08-05 ENCOUNTER — Encounter: Payer: Self-pay | Admitting: Family Medicine

## 2023-08-05 DIAGNOSIS — M79601 Pain in right arm: Secondary | ICD-10-CM | POA: Insufficient documentation

## 2023-08-05 DIAGNOSIS — R0982 Postnasal drip: Secondary | ICD-10-CM | POA: Insufficient documentation

## 2023-08-05 DIAGNOSIS — M659 Unspecified synovitis and tenosynovitis, unspecified site: Secondary | ICD-10-CM | POA: Insufficient documentation

## 2023-08-05 DIAGNOSIS — M7551 Bursitis of right shoulder: Secondary | ICD-10-CM | POA: Insufficient documentation

## 2023-08-05 NOTE — Assessment & Plan Note (Signed)
On Farxiga and Metformin. Plan to add a short course of Glipizide to counteract potential hyperglycemia from Prednisone. -Continue Farxiga and Metformin. -Add Glipizide during the course of Prednisone. -Monitor blood sugars during the course of Prednisone and Glipizide. -Plan for A1C check in January.

## 2023-08-05 NOTE — Assessment & Plan Note (Signed)
Intermittent cough, possibly related to postnasal drip or allergies. No associated fever, chest pain, shortness of breath, or abnormal lung sounds on examination. -Advise use of honey for symptomatic relief. -Consider use of nasal saline, humidified air, or over-the-counter Flonase for potential postnasal drip.

## 2023-08-05 NOTE — Assessment & Plan Note (Signed)
Acute onset of pain in the right arm for the past two weeks, possibly related to overuse or minor injury. No associated numbness, tingling, or weakness. Pain is intermittent and partially relieved by heat, cold, arthritis rub, and extra strength Tylenol. Physical examination suggests possible bicep tendonitis. -Start a short course of Prednisone 20mg  for 5 days to reduce inflammation. -Add Glipizide to counteract potential hyperglycemia from Prednisone. -Plan for imaging if no improvement.

## 2023-08-06 ENCOUNTER — Encounter: Payer: Self-pay | Admitting: Family Medicine

## 2023-08-06 ENCOUNTER — Other Ambulatory Visit: Payer: Self-pay

## 2023-08-06 DIAGNOSIS — E118 Type 2 diabetes mellitus with unspecified complications: Secondary | ICD-10-CM

## 2023-08-07 ENCOUNTER — Other Ambulatory Visit: Payer: Self-pay

## 2023-08-07 DIAGNOSIS — E118 Type 2 diabetes mellitus with unspecified complications: Secondary | ICD-10-CM

## 2023-08-07 MED ORDER — METFORMIN HCL ER 500 MG PO TB24: 500.0000 mg | ORAL_TABLET | Freq: Two times a day (BID) | ORAL | 3 refills | Status: DC

## 2023-08-08 ENCOUNTER — Encounter: Payer: Self-pay | Admitting: Family Medicine

## 2023-08-10 ENCOUNTER — Other Ambulatory Visit: Payer: Self-pay | Admitting: Family Medicine

## 2023-08-10 DIAGNOSIS — M79601 Pain in right arm: Secondary | ICD-10-CM

## 2023-08-11 ENCOUNTER — Ambulatory Visit
Admission: RE | Admit: 2023-08-11 | Discharge: 2023-08-11 | Disposition: A | Discharge status: Home / Self Care | Payer: Medicare Other | Source: Ambulatory Visit | Attending: Family Medicine | Admitting: Family Medicine

## 2023-08-11 ENCOUNTER — Ambulatory Visit
Admission: RE | Admit: 2023-08-11 | Discharge: 2023-08-11 | Disposition: A | Discharge status: Home / Self Care | Payer: Medicare Other | Attending: Family Medicine | Admitting: Family Medicine

## 2023-08-11 DIAGNOSIS — M79601 Pain in right arm: Secondary | ICD-10-CM | POA: Diagnosis present

## 2023-08-14 ENCOUNTER — Telehealth: Payer: Self-pay

## 2023-08-14 ENCOUNTER — Encounter: Payer: Self-pay | Admitting: Family Medicine

## 2023-08-14 NOTE — Telephone Encounter (Signed)
Pt stated she would give office a call back due to not being able to hear

## 2023-08-14 NOTE — Telephone Encounter (Signed)
Patient states she is returning our call.  I read message from Dr. Dana Allan to patient.  Patient states she would like to have a referral to orthopedics for evaluation for injection.  Patient states she is agreeable to having a referral for physical therapy.  Patient states she is interested in going to [facility] for physical therapy if her insurance will cover it.  Patient states a copay will be fine.

## 2023-08-14 NOTE — Telephone Encounter (Signed)
-----   Message from Dana Allan sent at 08/14/2023  1:21 PM EST ----- Moderate arthritis noted and shoulder impingement.  Does she want a referral to orthopedics for evaluation for injection?  Would also recommend physical therapy referral.

## 2023-08-17 ENCOUNTER — Ambulatory Visit (INDEPENDENT_AMBULATORY_CARE_PROVIDER_SITE_OTHER): Payer: Medicare Other | Admitting: Obstetrics and Gynecology

## 2023-08-17 ENCOUNTER — Encounter: Payer: Self-pay | Admitting: Obstetrics and Gynecology

## 2023-08-17 VITALS — BP 131/73 | HR 56

## 2023-08-17 DIAGNOSIS — N3281 Overactive bladder: Secondary | ICD-10-CM | POA: Diagnosis not present

## 2023-08-17 NOTE — Progress Notes (Signed)
[facility] Return Visit  SUBJECTIVE  History of Present Illness: Stacie Porter is a 77 y.o. female seen in follow-up after urethral bulking on 07/19/23.   She feel her symptoms have improved- has no leakage with coughing. Still has leakage with getting out of bed. Waking up 2-3 times a night. Wears a pad but only uses one per day. She is no longer taking the mybetriq because she did not notice a difference.   Past Medical History: Patient  has a past medical history of Cataract, Diabetes mellitus without complication (HCC), Dysrhythmia, GERD (gastroesophageal reflux disease), basal cell carcinoma (~2014 [facility]), Hyperlipidemia, Hypertension, Osteomyelitis of second toe of right foot (HCC) (11/11/2018), and Sleep apnea.   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   Medications: She has a current medication list which includes the following prescription(s): aspirin ec, calcium carbonate-vitamin d, freestyle libre 2 sensor, dapagliflozin propanediol, hydrochlorothiazide, lisinopril, magnesium oxide, metformin, metoprolol tartrate, multiple vitamin, fish oil, omeprazole, simvastatin, and zinc gluconate.   Allergies: Patient is allergic to multaq [dronedarone].   Social History: Patient  reports that she has never smoked. She has never used smokeless tobacco. She reports that she does not drink alcohol and does not use drugs.     OBJECTIVE     Physical Exam: Vitals:   08/17/23 0848  BP: 131/73  Pulse: (!) 56   Gen: No apparent distress, A&O x 3.  Detailed Urogynecologic Evaluation:  Deferred.    ASSESSMENT AND PLAN     Stacie Porter is a 77 y.o. with:  1. Overactive bladder    - SUI symptoms have resolved.  - For OAB, we discussed options of pelvic PT, another medication, or third line therapies such as PTNS, intravesical botox and sacral nerve stimulation. She feels like her symptoms are manageable at this time and does not want any intervention right now.   Return as needed, if symptoms worsen  Marguerita Beards, MD  Time spent: I spent 15 minutes dedicated to the care of this patient on the date of this encounter to include pre-visit review of records, face-to-face time with the patient and post visit documentation.

## 2023-08-22 ENCOUNTER — Encounter: Payer: Self-pay | Admitting: Family Medicine

## 2023-08-23 ENCOUNTER — Other Ambulatory Visit: Payer: Self-pay | Admitting: Family Medicine

## 2023-08-23 DIAGNOSIS — M79601 Pain in right arm: Secondary | ICD-10-CM

## 2023-08-27 NOTE — Telephone Encounter (Signed)
Called pt and she has already been to orthopedic  on 08/24/2023.

## 2023-08-31 NOTE — Progress Notes (Deleted)
   SUBJECTIVE:  No chief complaint on file.  HPI ***  PERTINENT PMH / PSH: ***  OBJECTIVE:  There were no vitals taken for this visit.   Physical Exam     08/02/2023    8:51 AM 05/18/2023    7:58 AM 05/03/2023    9:34 AM 04/10/2023   11:12 AM 01/17/2023    1:24 PM  Depression screen PHQ 2/9  Decreased Interest 0 0 0 0 0  Down, Depressed, Hopeless 0 0 0 0 0  PHQ - 2 Score 0 0 0 0 0  Altered sleeping 0 0 0  1  Tired, decreased energy 0 0 0  0  Change in appetite 0 0 0  0  Feeling bad or failure about yourself  0 0 0  0  Trouble concentrating 0 0 0  0  Moving slowly or fidgety/restless 0 0 0  0  Suicidal thoughts 0 0 0  0  PHQ-9 Score 0 0 0  1  Difficult doing work/chores Not difficult at all Not difficult at all Not difficult at all  Not difficult at all      08/02/2023    8:51 AM 05/18/2023    7:58 AM 05/03/2023    9:34 AM  GAD 7 : Generalized Anxiety Score  Nervous, Anxious, on Edge 0 0 0  Control/stop worrying 0 0 0  Worry too much - different things 0 0 0  Trouble relaxing 0 0 0  Restless 0 0 0  Easily annoyed or irritable 0 0 0  Afraid - awful might happen 0 0 0  Total GAD 7 Score 0 0 0  Anxiety Difficulty Not difficult at all Not difficult at all Not difficult at all    ASSESSMENT/PLAN:  Hypertension associated with diabetes (HCC)  Type 2 diabetes mellitus with complications (HCC)   PDMP reviewed***  No follow-ups on file.  Glenys Ferrari, MD

## 2023-09-03 ENCOUNTER — Ambulatory Visit: Payer: Federal, State, Local not specified - PPO | Admitting: Family Medicine

## 2023-09-03 DIAGNOSIS — E118 Type 2 diabetes mellitus with unspecified complications: Secondary | ICD-10-CM

## 2023-09-03 DIAGNOSIS — E1159 Type 2 diabetes mellitus with other circulatory complications: Secondary | ICD-10-CM

## 2023-09-03 DIAGNOSIS — E1169 Type 2 diabetes mellitus with other specified complication: Secondary | ICD-10-CM

## 2023-09-03 DIAGNOSIS — M79601 Pain in right arm: Secondary | ICD-10-CM

## 2023-09-03 DIAGNOSIS — I480 Paroxysmal atrial fibrillation: Secondary | ICD-10-CM

## 2023-09-06 ENCOUNTER — Ambulatory Visit: Payer: Self-pay | Admitting: Family Medicine

## 2023-09-06 NOTE — Telephone Encounter (Signed)
  Chief Complaint: Fall Symptoms: R shoulder pain Frequency: Since 1700 yesterday Pertinent Negatives: Patient denies numbness or tingling in arm or hand, LOC, open wound, dizziness Disposition: [] ED /[x] Urgent Care (no appt availability in office) / [] Appointment(In office/virtual)/ []  [facility]/ [] Home Care/ [] Refused Recommended Disposition /[] [facility]/ []  Follow-up with PCP Additional Notes: Patient calls stating she tripped on a mat she had in front of her stove around 1700 last night, causing a fall where she hurt her R shoulder. She reports she did hit her head, denies LOC. She states she felt okay last night, but this morning with movement she has 8/10 pain and is unable to lift her arm up. Patient denies numbness or tingling in arm or hand, denies bruising or open wound, states there is mild swelling where her bra strap sits on her shoulder. States she takes ASA daily. Per protocol, pt to be evaluated within 4 hours. Pt states she does not want to go to ED and that she prefers to be seen at [facility] for evaluation and is having her daughter drive her. Care advice reviewed, pt verbalized understanding. Alerting PCP for review.   Copied from CRM [id number]. Topic: Clinical - Red Word Triage >> Sep 06, 2023  7:42 AM Robinson H wrote: [facility] that prompted transfer to Nurse Triage: Patient states she fell in her house last night, can't hardly move arm up at all, shoulder is swollen. Doesn't hurt until she moves Reason for Disposition  [1] MODERATE weakness (i.e., interferes with work, school, normal activities) AND [2] new-onset or worsening  Answer Assessment - Initial Assessment Questions 1. MECHANISM: How did the fall happen?     Tripped on rubber mat in front of sink while cooking 2. DOMESTIC VIOLENCE AND ELDER ABUSE SCREENING: Did you fall because someone pushed you or tried to hurt you? If Yes, ask: Are you safe now?     Denies 3. ONSET: When did the  fall happen? (e.g., minutes, hours, or days ago)     Yesterday around 1700 4. LOCATION: What part of the body hit the ground? (e.g., back, buttocks, head, hips, knees, hands, head, stomach)     Hit head denies LOC, R shoulder 5. INJURY: Did you hurt (injure) yourself when you fell? If Yes, ask: What did you injure? Tell me more about this? (e.g., body area; type of injury; pain severity)     Yes, R shoulder, minimal movement, swelling where bra strap goes 6. PAIN: Is there any pain? If Yes, ask: How bad is the pain? (e.g., Scale 1-10; or mild,  moderate, severe)   - NONE (0): No pain   - MILD (1-3): Doesn't interfere with normal activities    - MODERATE (4-7): Interferes with normal activities or awakens from sleep    - SEVERE (8-10): Excruciating pain, unable to do any normal activities      No pain unless moving, 8/10 with movement 7. SIZE: For cuts, bruises, or swelling, ask: How large is it? (e.g., inches or centimeters)      Yes, swelling R shoulder around bra strap  9. OTHER SYMPTOMS: Do you have any other symptoms? (e.g., dizziness, fever, weakness; new onset or worsening).      Denies 10. CAUSE: What do you think caused the fall (or falling)? (e.g., tripped, dizzy spell)       Tripped  Protocols used: Falls and [facility]

## 2023-09-11 ENCOUNTER — Ambulatory Visit: Payer: Medicare Other | Admitting: Family Medicine

## 2023-09-11 VITALS — BP 134/64 | HR 63 | Temp 98.0°F | Resp 18 | Ht 64.0 in | Wt 211.0 lb

## 2023-09-11 DIAGNOSIS — E118 Type 2 diabetes mellitus with unspecified complications: Secondary | ICD-10-CM | POA: Diagnosis not present

## 2023-09-11 DIAGNOSIS — E785 Hyperlipidemia, unspecified: Secondary | ICD-10-CM

## 2023-09-11 DIAGNOSIS — Z7984 Long term (current) use of oral hypoglycemic drugs: Secondary | ICD-10-CM

## 2023-09-11 DIAGNOSIS — E1159 Type 2 diabetes mellitus with other circulatory complications: Secondary | ICD-10-CM | POA: Diagnosis not present

## 2023-09-11 DIAGNOSIS — E1169 Type 2 diabetes mellitus with other specified complication: Secondary | ICD-10-CM | POA: Diagnosis not present

## 2023-09-11 DIAGNOSIS — Z1231 Encounter for screening mammogram for malignant neoplasm of breast: Secondary | ICD-10-CM

## 2023-09-11 DIAGNOSIS — M7551 Bursitis of right shoulder: Secondary | ICD-10-CM

## 2023-09-11 DIAGNOSIS — E1165 Type 2 diabetes mellitus with hyperglycemia: Secondary | ICD-10-CM

## 2023-09-11 DIAGNOSIS — I152 Hypertension secondary to endocrine disorders: Secondary | ICD-10-CM | POA: Diagnosis not present

## 2023-09-11 LAB — POCT GLYCOSYLATED HEMOGLOBIN (HGB A1C): Hemoglobin A1C: 7.6 % — AB (ref 4.0–5.6)

## 2023-09-11 NOTE — Progress Notes (Signed)
 SUBJECTIVE:   Chief Complaint  Patient presents with   Medical Management of Chronic Issues   HPI Presents for follow up chronic disease management  Discussed the use of AI scribe software for clinical note transcription with the patient, who gave verbal consent to proceed.  History of Present Illness The patient, with a history of bursitis, presented with a recent fall at home. She tripped over a mat and fell, injuring her shoulder. Prior to the fall, the patient had been managing her bursitis with prednisone  and a cortisone shot, which had improved her symptoms. However, the fall exacerbated the shoulder pain. Post-fall, the patient had x-rays done at [facility] and was seen by Doctor Randall, who suggested the possibility of a torn rotator cuff, but noted that an MRI would be needed for confirmation. The patient reported that her shoulder pain was not severe and had improved since the fall, as she was able to lift her hand higher than initially post-fall.  The patient also reported a recent episode of feeling weird while cooking, which persisted through church. During this episode, her blood pressure increased to 150 over something, but later decreased to 140, and then further decreased to 115-120 over the following days. The patient's daughter suggested that these symptoms might be related to her use of HCTC, which the patient confirmed she was taking, along with Lisinopril  and Metoprolol .  The patient also mentioned a recent cough for which she had taken Mucinex and Robitussin, both of which made her feel weird. She has since stopped taking these medications. The patient's A1c was reported to be 7.6, down from 7.8. She has not been checking her blood sugar regularly due to the recent events and the cold weather.  The patient also mentioned that she was due for a mammogram, which she usually has done at a [facility] facility across from the hospital. She was unsure if an appointment had been  scheduled.    PERTINENT PMH / PSH: As above  OBJECTIVE:  BP 134/64   Pulse 63   Temp 98 F (36.7 C)   Resp 18   Ht 5' 4 (1.626 m)   Wt 211 lb (95.7 kg)   SpO2 96%   BMI 36.22 kg/m    Physical Exam Vitals reviewed.  Constitutional:      General: She is not in acute distress.    Appearance: Normal appearance. She is obese. She is not ill-appearing, toxic-appearing or diaphoretic.  Eyes:     General:        Right eye: No discharge.        Left eye: No discharge.     Conjunctiva/sclera: Conjunctivae normal.  Neck:     Thyroid : No thyromegaly or thyroid  tenderness.  Cardiovascular:     Rate and Rhythm: Normal rate and regular rhythm.     Heart sounds: Normal heart sounds.  Pulmonary:     Effort: Pulmonary effort is normal.     Breath sounds: Normal breath sounds.  Abdominal:     General: Bowel sounds are normal.  Musculoskeletal:        General: Normal range of motion.  Skin:    General: Skin is warm and dry.  Neurological:     General: No focal deficit present.     Mental Status: She is alert and oriented to person, place, and time. Mental status is at baseline.  Psychiatric:        Mood and Affect: Mood normal.  Behavior: Behavior normal.        Thought Content: Thought content normal.        Judgment: Judgment normal.           09/11/2023    1:33 PM 08/02/2023    8:51 AM 05/18/2023    7:58 AM 05/03/2023    9:34 AM 04/10/2023   11:12 AM  Depression screen PHQ 2/9  Decreased Interest 0 0 0 0 0  Down, Depressed, Hopeless 0 0 0 0 0  PHQ - 2 Score 0 0 0 0 0  Altered sleeping 0 0 0 0   Tired, decreased energy 0 0 0 0   Change in appetite 0 0 0 0   Feeling bad or failure about yourself  0 0 0 0   Trouble concentrating 0 0 0 0   Moving slowly or fidgety/restless 0 0 0 0   Suicidal thoughts 0 0 0 0   PHQ-9 Score 0 0 0 0   Difficult doing work/chores Not difficult at all Not difficult at all Not difficult at all Not difficult at all       09/11/2023     1:33 PM 08/02/2023    8:51 AM 05/18/2023    7:58 AM 05/03/2023    9:34 AM  GAD 7 : Generalized Anxiety Score  Nervous, Anxious, on Edge 0 0 0 0  Control/stop worrying 0 0 0 0  Worry too much - different things 0 0 0 0  Trouble relaxing 0 0 0 0  Restless 0 0 0 0  Easily annoyed or irritable 0 0 0 0  Afraid - awful might happen 0 0 0 0  Total GAD 7 Score 0 0 0 0  Anxiety Difficulty Not difficult at all Not difficult at all Not difficult at all Not difficult at all    ASSESSMENT/PLAN:  Type 2 diabetes mellitus with complications (HCC) Assessment & Plan: A1c 7.6, down from 7.8. -Continue Farxiga  10 mg daily -Continue Metformin  500 mg BIDBIF -No changes to current management.  Orders: -     POCT glycosylated hemoglobin (Hb A1C)  Hypertension associated with diabetes (HCC) Assessment & Plan: Recent episode of feeling weird with noted increase in home blood pressure readings. Currently on Lisinopril  and Hydrochlorothiazide . -Check electrolytes and kidney function today. -Consider discontinuing Hydrochlorothiazide  and increasing Lisinopril  dose based on lab results. Monitor blood pressure closely if changes are made.  Addendum -Stop HCTZ -Continue Lisinopril  40 mg daily.   -Closely monitor BP.  Goal <140/90.  If increase notify MD  Orders: -     Comprehensive metabolic panel -     Lisinopril ; Take 2 tablets (40 mg total) by mouth daily.  Hypomagnesemia -     Magnesium   Hyperlipidemia associated with type 2 diabetes mellitus (HCC) Assessment & Plan: Managed with Crestor  -Does not need any refills at present   Breast cancer screening by mammogram -     3D Screening Mammogram, Left and Right; Future  Chronic bursitis of right shoulder Assessment & Plan: Recent fall with subsequent exacerbation of previously improving bursitis. Received cortisone injection from Dr. Randall. X-rays done post-fall showed no fracture. Improvement noted with ability to lift arm. -Follow-up with  Dr. Corie PA for further evaluation and management.   Morbid obesity (HCC) Assessment & Plan: Continue to encourage healthy diet and increase activity    General Health Maintenance -Refill medications as needed, patient to notify office when refills are required.   PDMP reviewed  Return in about 6  months (around 03/10/2024) for PCP, DM, HTN.  Glenys Ferrari, MD

## 2023-09-11 NOTE — Patient Instructions (Addendum)
 It was a pleasure meeting you today. Thank you for allowing me to take part in your health care.  Our goals for today as we discussed include:   A1c today 7.6  We will get some labs today.  If they are abnormal or we need to do something about them, I will call you.  If they are normal, I will send you a message on MyChart (if it is active) or a letter in the mail.  If you don't hear from us  in 2 weeks, please call the office at the number below.   Referral sent for Mammogram. Please call to schedule appointment. [facility] [address] [place], [place] [address] [phone]    Follow up with Orthopedics    If you have any questions or concerns, please do not hesitate to call the office at [phone].  I look forward to our next visit and until then take care and stay safe.  Regards,   Glenys Ferrari, MD   [facility]

## 2023-09-12 ENCOUNTER — Encounter: Payer: Self-pay | Admitting: Family Medicine

## 2023-09-12 ENCOUNTER — Other Ambulatory Visit: Payer: Self-pay | Admitting: Family Medicine

## 2023-09-12 LAB — COMPREHENSIVE METABOLIC PANEL
ALT: 19 U/L (ref 0–35)
AST: 16 U/L (ref 0–37)
Albumin: 4.2 g/dL (ref 3.5–5.2)
Alkaline Phosphatase: 43 U/L (ref 39–117)
BUN: 25 mg/dL — ABNORMAL HIGH (ref 6–23)
CO2: 28 meq/L (ref 19–32)
Calcium: 10.1 mg/dL (ref 8.4–10.5)
Chloride: 102 meq/L (ref 96–112)
Creatinine, Ser: 0.97 mg/dL (ref 0.40–1.20)
GFR: 56.59 mL/min — ABNORMAL LOW (ref >60.00)
Glucose, Bld: 129 mg/dL — ABNORMAL HIGH (ref 70–99)
Potassium: 4.6 meq/L (ref 3.5–5.1)
Sodium: 141 meq/L (ref 135–145)
Total Bilirubin: 0.6 mg/dL (ref 0.2–1.2)
Total Protein: 6.6 g/dL (ref 6.0–8.3)

## 2023-09-12 LAB — MAGNESIUM: Magnesium: 1.5 mg/dL (ref 1.5–2.5)

## 2023-09-12 MED ORDER — MAGNESIUM OXIDE 400 MG PO CAPS: ORAL_CAPSULE | ORAL | 0 refills | Status: DC

## 2023-09-13 MED ORDER — LISINOPRIL 20 MG PO TABS: 40.0000 mg | ORAL_TABLET | Freq: Every day | ORAL | Status: DC

## 2023-09-15 ENCOUNTER — Encounter: Payer: Self-pay | Admitting: Family Medicine

## 2023-09-17 ENCOUNTER — Telehealth: Payer: Self-pay | Admitting: Family Medicine

## 2023-09-17 ENCOUNTER — Other Ambulatory Visit: Payer: Self-pay

## 2023-09-17 ENCOUNTER — Other Ambulatory Visit: Payer: Self-pay | Admitting: *Deleted

## 2023-09-17 ENCOUNTER — Ambulatory Visit: Payer: Federal, State, Local not specified - PPO | Admitting: Family Medicine

## 2023-09-17 DIAGNOSIS — E1165 Type 2 diabetes mellitus with hyperglycemia: Secondary | ICD-10-CM

## 2023-09-17 MED ORDER — SIMVASTATIN 40 MG PO TABS: 40.0000 mg | ORAL_TABLET | Freq: Every day | ORAL | 1 refills | Status: DC

## 2023-09-17 MED ORDER — METOPROLOL TARTRATE 25 MG PO TABS: 12.5000 mg | ORAL_TABLET | Freq: Two times a day (BID) | ORAL | 3 refills | Status: DC

## 2023-09-17 NOTE — Telephone Encounter (Signed)
[facility] pharmacy is requesting prescription refill simvastatin (ZOCOR) 40 MG tablet   Please advise

## 2023-09-17 NOTE — Telephone Encounter (Signed)
Refill request sent to provider.

## 2023-09-17 NOTE — Telephone Encounter (Signed)
Sent!

## 2023-09-23 ENCOUNTER — Encounter: Payer: Self-pay | Admitting: Family Medicine

## 2023-09-23 DIAGNOSIS — Z1231 Encounter for screening mammogram for malignant neoplasm of breast: Secondary | ICD-10-CM | POA: Insufficient documentation

## 2023-09-23 NOTE — Assessment & Plan Note (Signed)
A1c 7.6, down from 7.8. -Continue Farxiga 10 mg daily -Continue Metformin 500 mg BIDBIF -No changes to current management.

## 2023-09-23 NOTE — Assessment & Plan Note (Signed)
Continue to encourage healthy diet and increase activity

## 2023-09-23 NOTE — Assessment & Plan Note (Signed)
Recent fall with subsequent exacerbation of previously improving bursitis. Received cortisone injection from Dr. Enrigue Catena. X-rays done post-fall showed no fracture. Improvement noted with ability to lift arm. -Follow-up with Dr. Bryson Dames PA for further evaluation and management.

## 2023-09-23 NOTE — Assessment & Plan Note (Signed)
Recent episode of feeling "weird" with noted increase in home blood pressure readings. Currently on Lisinopril and Hydrochlorothiazide. -Check electrolytes and kidney function today. -Consider discontinuing Hydrochlorothiazide and increasing Lisinopril dose based on lab results. Monitor blood pressure closely if changes are made.  Addendum -Stop HCTZ -Continue Lisinopril 40 mg daily.   -Closely monitor BP.  Goal <140/90.  If increase notify MD

## 2023-09-23 NOTE — Assessment & Plan Note (Signed)
Managed with Crestor -Does not need any refills at present

## 2023-10-04 ENCOUNTER — Ambulatory Visit (INDEPENDENT_AMBULATORY_CARE_PROVIDER_SITE_OTHER): Payer: Medicare Other | Admitting: Nurse Practitioner

## 2023-10-04 VITALS — BP 124/60 | HR 69 | Temp 97.8°F | Resp 18 | Ht 64.0 in | Wt 216.5 lb

## 2023-10-04 DIAGNOSIS — R6889 Other general symptoms and signs: Secondary | ICD-10-CM

## 2023-10-04 DIAGNOSIS — J101 Influenza due to other identified influenza virus with other respiratory manifestations: Secondary | ICD-10-CM

## 2023-10-04 LAB — POCT INFLUENZA A/B
Influenza A, POC: POSITIVE — AB
Influenza B, POC: NEGATIVE

## 2023-10-04 MED ORDER — OSELTAMIVIR PHOSPHATE 75 MG PO CAPS: 75.0000 mg | ORAL_CAPSULE | Freq: Two times a day (BID) | ORAL | 0 refills | Status: AC

## 2023-10-04 NOTE — Progress Notes (Signed)
 Established Patient Office Visit  Subjective:  Patient ID: Stacie Porter, female    DOB: 29-Jan-1946  Age: 78 y.o. MRN: [id number]  CC:  Chief Complaint  Patient presents with   Influenza    C/O cough, runny nose, head stopped up since yesterday    HPI  Stacie Porter presents for:  URI  This is a new problem. The current episode started yesterday. The problem has been unchanged. There has been no fever. Associated symptoms include congestion, coughing, rhinorrhea and sneezing. She has tried nothing for the symptoms.  Patient and spouse tested positive for flu.  Past Medical History:  Diagnosis Date   Cataract    Diabetes mellitus without complication (HCC)    type 2   Dysrhythmia    a fib   GERD (gastroesophageal reflux disease)    Hx of basal cell carcinoma ~2014 Mohs   Nose   Hyperlipidemia    Hypertension    Osteomyelitis of second toe of right foot (HCC) 11/11/2018   Sleep apnea    cpap    Past Surgical History:  Procedure Laterality Date   AMPUTATION TOE Right 11/11/2018   Procedure: AMPUTATION RIGHT 2ND TOE;  Surgeon: Yvone Rush, MD;  Location: [facility];  Service: Orthopedics;  Laterality: Right;   APPENDECTOMY     CARDIAC ELECTROPHYSIOLOGY STUDY AND ABLATION     cather ablation  2011/2014   cardiac ablation for a fib   COLONOSCOPY WITH PROPOFOL  N/A 02/11/2021   Procedure: COLONOSCOPY WITH PROPOFOL ;  Surgeon: Janalyn Keene NOVAK, MD;  Location: [facility];  Service: Endoscopy;  Laterality: N/A;   EYE SURGERY Bilateral 05/2014   cataract   HAMMER TOE SURGERY Right    HAND SURGERY Bilateral    LAPAROSCOPIC OOPHERECTOMY     recession of gums sx     TUBAL LIGATION      Family History  Problem Relation Age of Onset   Heart disease Mother    Heart disease Father    Hypertension Father    Ovarian cancer Sister    Cancer Sister        ovarian   Lung cancer Sister    Cancer Sister        lung and thyroid    Thyroid  cancer Sister    Cancer Sister     Diabetes Maternal Uncle    Diabetes Maternal Uncle    Breast cancer Neg Hx     Social History   Socioeconomic History   Marital status: Married    Spouse name: Not on file   Number of children: 3   Years of education: Not on file   Highest education level: Some college, no degree  Occupational History   Occupation: retired    Comment: part time subsitute   Tobacco Use   Smoking status: Never   Smokeless tobacco: Never  Vaping Use   Vaping status: Never Used  Substance and Sexual Activity   Alcohol use: No   Drug use: No   Sexual activity: Yes    Birth control/protection: None  Other Topics Concern   Not on file  Social History Narrative   Not on file   Social Drivers of Health   Financial Resource Strain: Low Risk  (09/11/2023)   Overall Financial Resource Strain (CARDIA)    Difficulty of Paying Living Expenses: Not hard at all  Food Insecurity: No Food Insecurity (09/11/2023)   Hunger Vital Sign    Worried About Running Out of Food in the Last Year:  Never true    Ran Out of Food in the Last Year: Never true  Transportation Needs: No Transportation Needs (09/11/2023)   PRAPARE - Administrator, Civil Service (Medical): No    Lack of Transportation (Non-Medical): No  Physical Activity: Insufficiently Active (09/11/2023)   Exercise Vital Sign    Days of Exercise per Week: 1 day    Minutes of Exercise per Session: 20 min  Stress: No Stress Concern Present (09/11/2023)   [employer] of Occupational Health - Occupational Stress Questionnaire    Feeling of Stress : Not at all  Social Connections: Socially Integrated (09/11/2023)   Social Connection and Isolation Panel [NHANES]    Frequency of Communication with Friends and Family: More than three times a week    Frequency of Social Gatherings with Friends and Family: Three times a week    Attends Religious Services: More than 4 times per year    Active Member of Clubs or Organizations: Yes    Attends  Banker Meetings: More than 4 times per year    Marital Status: Married  Catering Manager Violence: Not At Risk (02/20/2023)   Humiliation, Afraid, Rape, and Kick questionnaire    Fear of Current or Ex-Partner: No    Emotionally Abused: No    Physically Abused: No    Sexually Abused: No     Outpatient Medications Prior to Visit  Medication Sig Dispense Refill   aspirin  EC (ASPIRIN  LOW DOSE) 81 MG tablet Take 1 tablet (81 mg total) by mouth daily. SWALLOW WHOLE.     Calcium  Carbonate-Vitamin D 600-200 MG-UNIT CAPS Take 1 tablet by mouth daily.     Continuous Glucose Sensor (FREESTYLE LIBRE 2 SENSOR) MISC 1 each by Does not apply route every 14 (fourteen) days.     dapagliflozin  propanediol (FARXIGA ) 10 MG TABS tablet Take 1 tablet (10 mg total) by mouth daily before breakfast. 90 tablet 3   lisinopril  (ZESTRIL ) 20 MG tablet Take 2 tablets (40 mg total) by mouth daily.     Magnesium  Oxide 400 MG CAPS Take 1 tablet two times a day 90 capsule 0   metFORMIN  (GLUCOPHAGE -XR) 500 MG 24 hr tablet Take 1 tablet (500 mg total) by mouth 2 (two) times daily with a meal. 180 tablet 3   metoprolol  tartrate (LOPRESSOR ) 25 MG tablet Take 0.5 tablets (12.5 mg total) by mouth 2 (two) times daily. 90 tablet 3   Multiple Vitamin tablet Take 1 tablet by mouth daily.     Omega-3 Fatty Acids (FISH OIL ) 1000 MG CAPS Take 1 capsule (1,000 mg total) by mouth daily.     omeprazole  (PRILOSEC) 20 MG capsule Take 1 capsule (20 mg total) by mouth daily. 90 capsule 3   simvastatin  (ZOCOR ) 40 MG tablet Take 1 tablet (40 mg total) by mouth at bedtime. 30 tablet 1   zinc gluconate 50 MG tablet Take 50 mg by mouth daily.     No facility-administered medications prior to visit.    Allergies  Allergen Reactions   Multaq [Dronedarone]     Unknown reaction     ROS Review of Systems  HENT:  Positive for congestion, rhinorrhea and sneezing.   Respiratory:  Positive for cough.    Negative unless indicated  in HPI.    Objective:    Physical Exam HENT:     Right Ear: Tympanic membrane normal. Tympanic membrane is not erythematous.     Left Ear: Tympanic membrane normal. Tympanic membrane is not  erythematous.     Nose:     Right Turbinates: Not enlarged.     Left Turbinates: Not enlarged.     Right Sinus: No maxillary sinus tenderness or frontal sinus tenderness.     Left Sinus: No maxillary sinus tenderness or frontal sinus tenderness.     Mouth/Throat:     Mouth: Mucous membranes are moist.     Pharynx: Postnasal drip present. No pharyngeal swelling, oropharyngeal exudate or posterior oropharyngeal erythema.     Tonsils: No tonsillar exudate.  Cardiovascular:     Rate and Rhythm: Normal rate and regular rhythm.  Pulmonary:     Effort: Pulmonary effort is normal.     Breath sounds: Normal breath sounds. No stridor. No wheezing.  Neurological:     General: No focal deficit present.     Mental Status: She is alert and oriented to person, place, and time. Mental status is at baseline.  Psychiatric:        Mood and Affect: Mood normal.        Behavior: Behavior normal.        Thought Content: Thought content normal.        Judgment: Judgment normal.     BP 124/60 (Cuff Size: Large)   Pulse 69   Temp 97.8 F (36.6 C) (Oral)   Resp 18   Ht 5' 4 (1.626 m)   Wt 216 lb 8 oz (98.2 kg)   SpO2 96%   BMI 37.16 kg/m  Wt Readings from Last 3 Encounters:  10/04/23 216 lb 8 oz (98.2 kg)  09/11/23 211 lb (95.7 kg)  08/02/23 217 lb (98.4 kg)     Health Maintenance  Topic Date Due   Medicare Annual Wellness (AWV)  11/27/2023   COVID-19 Vaccine (3 - Moderna risk series) 10/20/2023 (Originally 12/04/2019)   OPHTHALMOLOGY EXAM  10/11/2023   Diabetic kidney evaluation - Urine ACR  02/05/2024   HEMOGLOBIN A1C  03/10/2024   FOOT EXAM  08/01/2024   Diabetic kidney evaluation - eGFR measurement  09/10/2024   DTaP/Tdap/Td (5 - Td or Tdap) 09/23/2024   Colonoscopy  02/12/2028   DEXA SCAN   04/16/2033   Pneumonia Vaccine 23+ Years old  Completed   INFLUENZA VACCINE  Completed   Hepatitis C Screening  Completed   Zoster Vaccines- Shingrix  Completed   HPV VACCINES  Aged Out    There are no preventive care reminders to display for this patient.  Lab Results  Component Value Date   TSH 1.670 11/25/2021   Lab Results  Component Value Date   WBC 9.8 05/18/2023   HGB 13.1 05/18/2023   HCT 40.0 05/18/2023   MCV 97.0 05/18/2023   PLT 120.0 (L) 05/18/2023   Lab Results  Component Value Date   NA 141 09/11/2023   K 4.6 09/11/2023   CO2 28 09/11/2023   GLUCOSE 129 (H) 09/11/2023   BUN 25 (H) 09/11/2023   CREATININE 0.97 09/11/2023   BILITOT 0.6 09/11/2023   ALKPHOS 43 09/11/2023   AST 16 09/11/2023   ALT 19 09/11/2023   PROT 6.6 09/11/2023   ALBUMIN 4.2 09/11/2023   CALCIUM  10.1 09/11/2023   ANIONGAP 6 02/20/2023   EGFR 57 (L) 04/16/2023   GFR 56.59 (L) 09/11/2023   Lab Results  Component Value Date   CHOL 151 02/05/2023   Lab Results  Component Value Date   HDL 39 (L) 02/05/2023   Lab Results  Component Value Date   LDLCALC 82 02/05/2023  Lab Results  Component Value Date   TRIG 174 (H) 02/05/2023   Lab Results  Component Value Date   CHOLHDL 3.9 02/05/2023   Lab Results  Component Value Date   HGBA1C 7.6 (A) 09/11/2023      Assessment & Plan:  Influenza A Assessment & Plan: Pt is afebrile, non toxic appearing, without respiratoty distress.  Positive exposure for flu.   -Tested positive for flu A . -Will treat with Tamiflu  twice a day for 5 days. -Advised to take plain Mucinex for congestion and OTC antihistamine for PND. -Increase fluid intake and rest.   Orders: -     Oseltamivir  Phosphate; Take 1 capsule (75 mg total) by mouth 2 (two) times daily for 5 days.  Dispense: 10 capsule; Refill: 0  Flu-like symptoms -     POCT Influenza A/B    Follow-up: Return if symptoms worsen or fail to improve.   Markeisha Mancias, NP

## 2023-10-08 ENCOUNTER — Ambulatory Visit
Admission: RE | Admit: 2023-10-08 | Discharge: 2023-10-08 | Disposition: A | Discharge status: Home / Self Care | Payer: Medicare Other | Source: Ambulatory Visit | Attending: Family Medicine | Admitting: Family Medicine

## 2023-10-08 DIAGNOSIS — Z1231 Encounter for screening mammogram for malignant neoplasm of breast: Secondary | ICD-10-CM | POA: Diagnosis present

## 2023-10-09 ENCOUNTER — Other Ambulatory Visit: Payer: Self-pay | Admitting: Family Medicine

## 2023-10-09 DIAGNOSIS — J101 Influenza due to other identified influenza virus with other respiratory manifestations: Secondary | ICD-10-CM | POA: Insufficient documentation

## 2023-10-09 IMAGING — MG MM DIGITAL SCREENING BILAT W/ TOMO AND CAD
6 of 10 series · 6 of 30 positions shown · non-contrast
Comparison: Previous exam(s).

CLINICAL DATA: Screening.

EXAM:
DIGITAL SCREENING BILATERAL MAMMOGRAM WITH TOMOSYNTHESIS AND CAD
TECHNIQUE: Bilateral screening digital craniocaudal and mediolateral oblique
mammograms were obtained. Bilateral screening digital breast
tomosynthesis was performed. The images were evaluated with
computer-aided detection.

[R MLO synth-2D (1 of 2)]
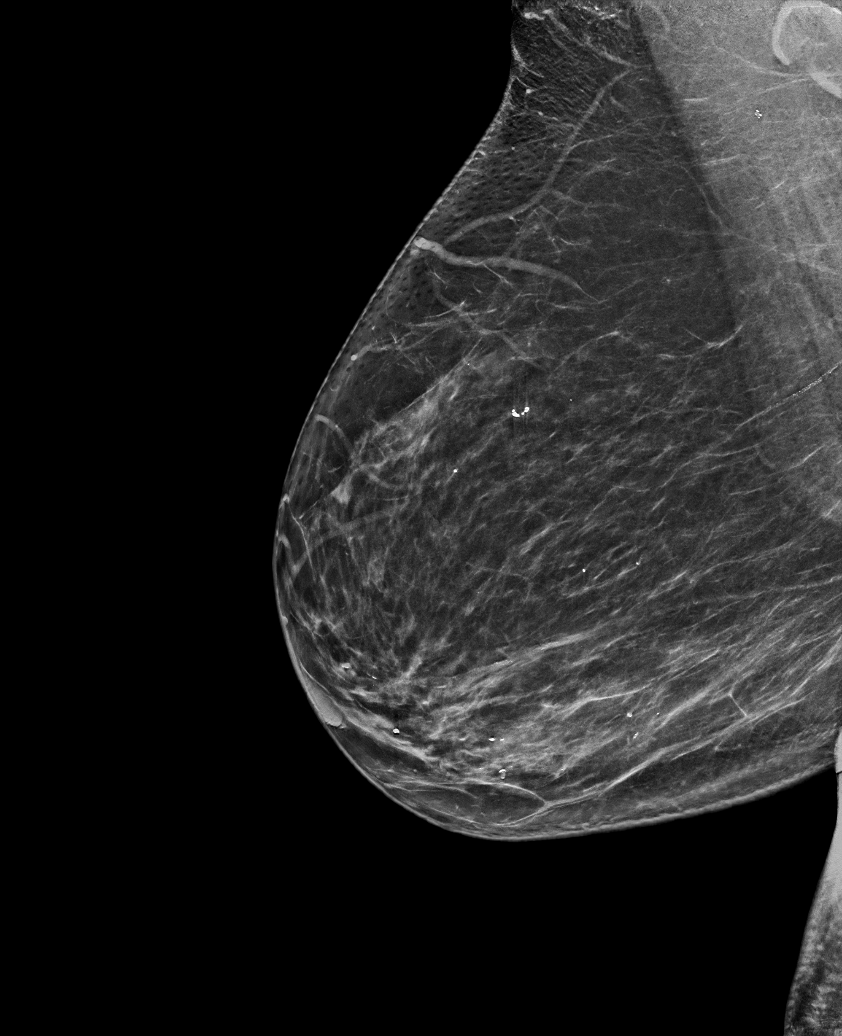

[R MLO synth-2D (2 of 2)]
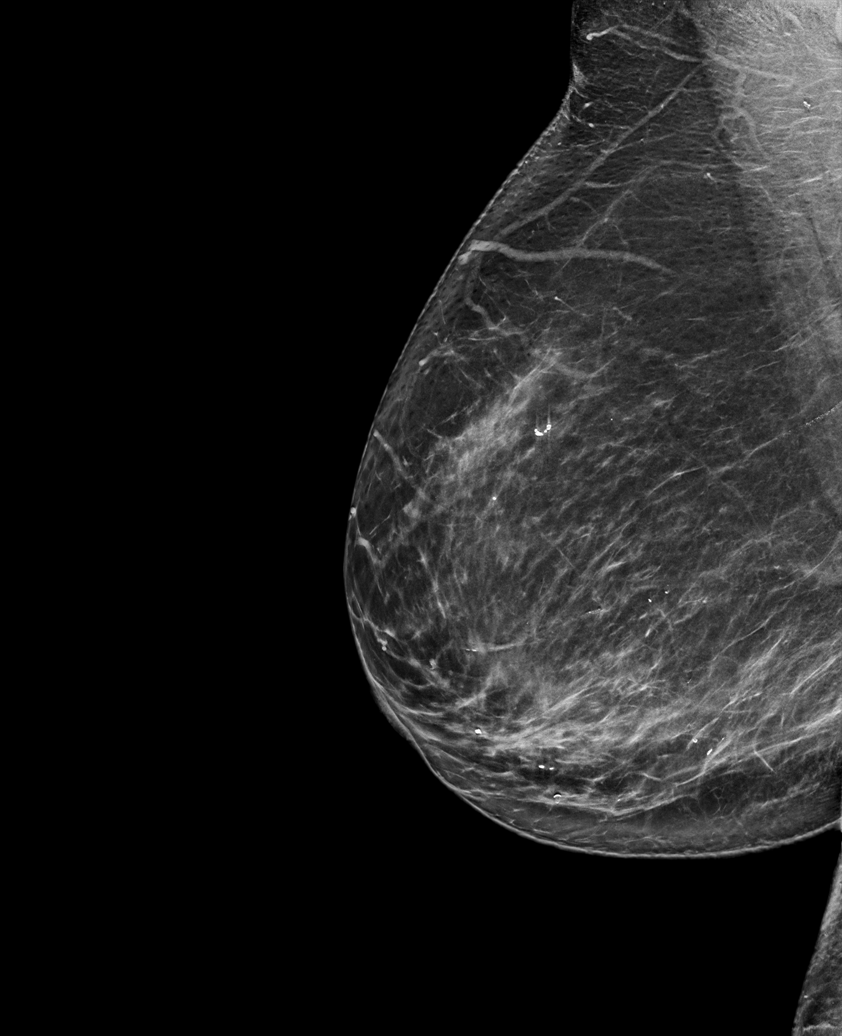

[R CC synth-2D]
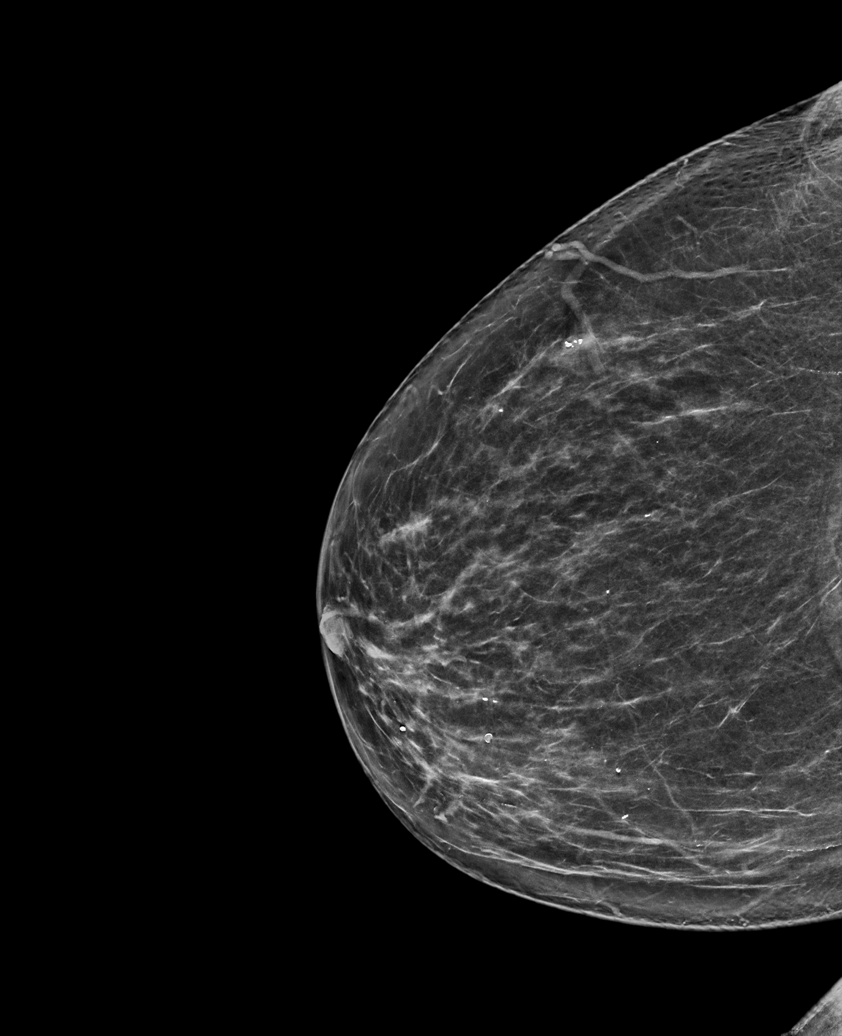

[L CC synth-2D]
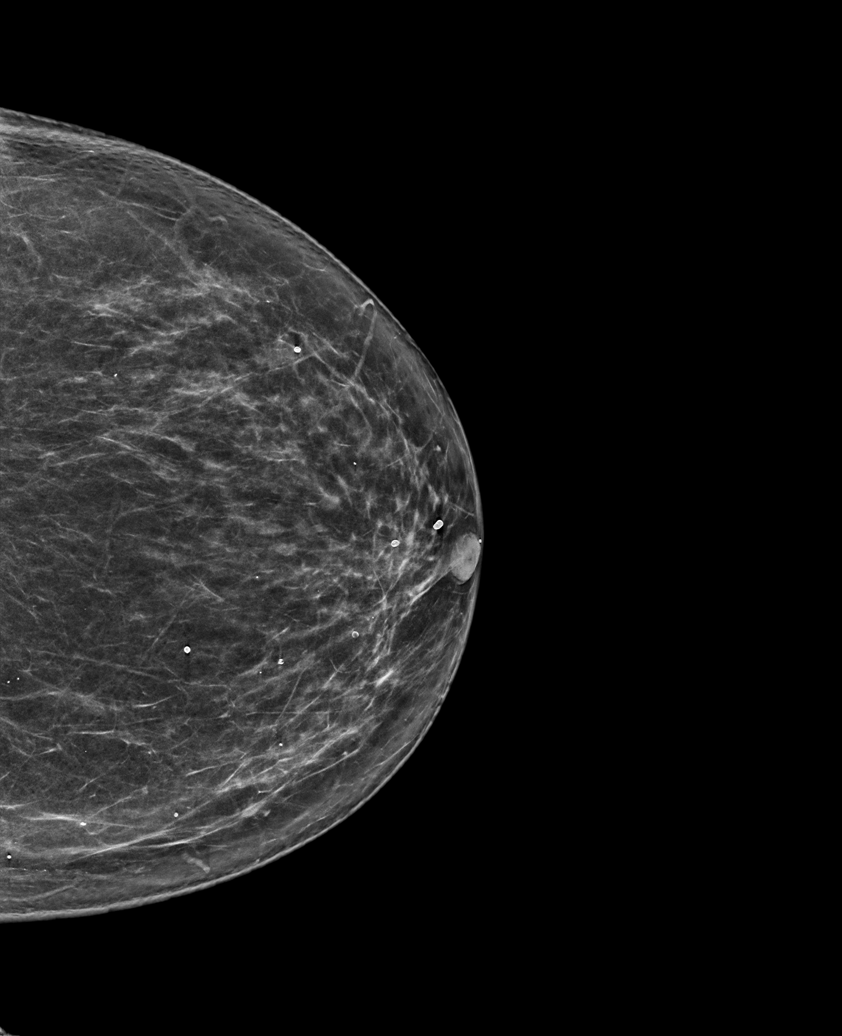

[L MLO synth-2D]
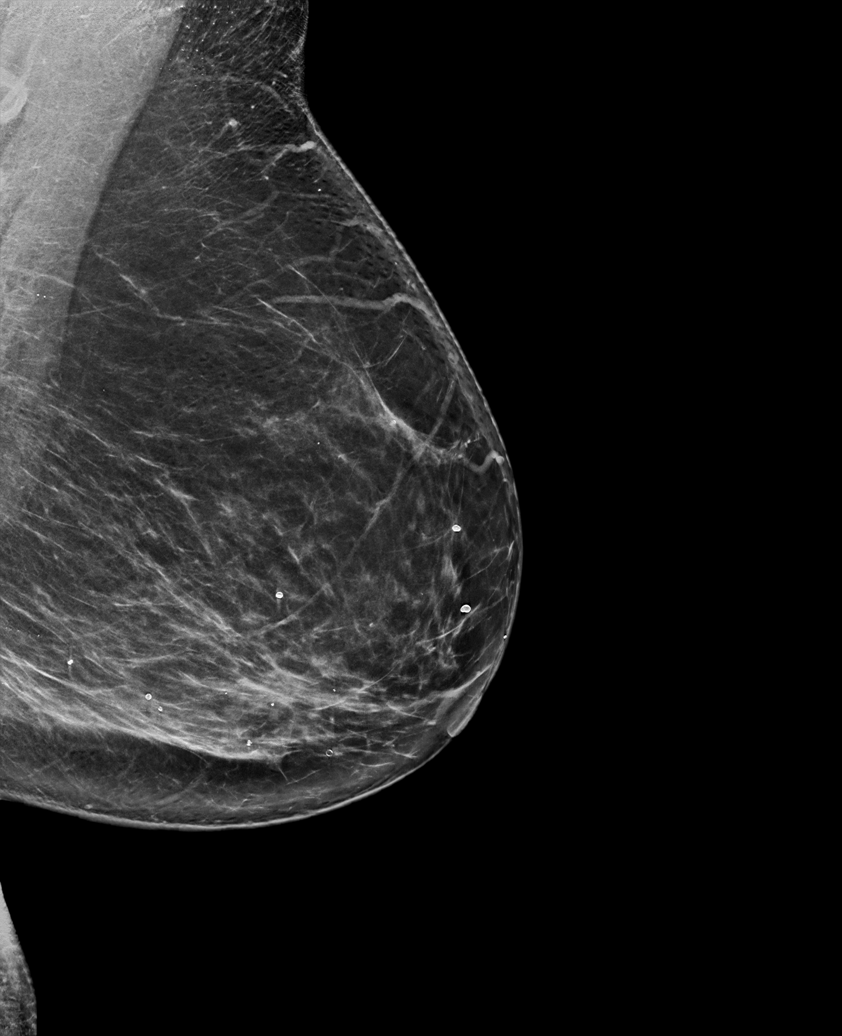

[R MLO tomo · tomo slice 41/80.0]
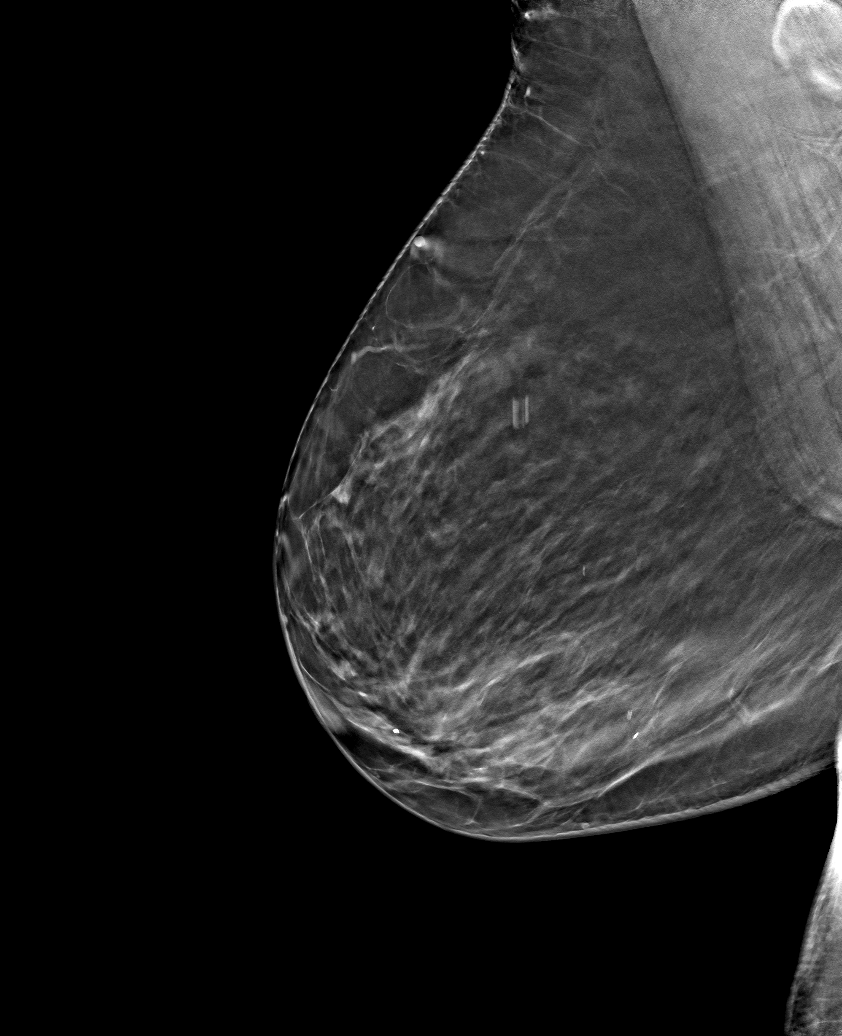

[6 of 30 positions shown; findings below may reference images not displayed]

ACR Breast Density Category b: There are scattered areas of
fibroglandular density.
FINDINGS: There are no findings suspicious for malignancy.
IMPRESSION: No mammographic evidence of malignancy. A result letter of this
screening mammogram will be mailed directly to the patient.

RECOMMENDATION:
Screening mammogram in one year. (Code:51-O-LD2)

BI-RADS CATEGORY  1: Negative.

## 2023-10-09 NOTE — Assessment & Plan Note (Signed)
Pt is afebrile, non toxic appearing, without respiratoty distress.  Positive exposure for flu.   -Tested positive for flu A . -Will treat with Tamiflu twice a day for 5 days. -Advised to take plain Mucinex for congestion and OTC antihistamine for PND. -Increase fluid intake and rest.

## 2023-10-09 NOTE — Patient Instructions (Signed)

## 2023-10-10 ENCOUNTER — Ambulatory Visit: Payer: Medicare Other | Admitting: Family Medicine

## 2023-10-10 ENCOUNTER — Encounter: Payer: Self-pay | Admitting: Family Medicine

## 2023-10-10 VITALS — BP 136/62 | HR 67 | Temp 97.9°F | Resp 18 | Ht 64.0 in | Wt 214.0 lb

## 2023-10-10 DIAGNOSIS — Z7984 Long term (current) use of oral hypoglycemic drugs: Secondary | ICD-10-CM | POA: Diagnosis not present

## 2023-10-10 DIAGNOSIS — R197 Diarrhea, unspecified: Secondary | ICD-10-CM

## 2023-10-10 DIAGNOSIS — E118 Type 2 diabetes mellitus with unspecified complications: Secondary | ICD-10-CM

## 2023-10-10 DIAGNOSIS — L304 Erythema intertrigo: Secondary | ICD-10-CM

## 2023-10-10 DIAGNOSIS — B379 Candidiasis, unspecified: Secondary | ICD-10-CM

## 2023-10-10 MED ORDER — NYSTATIN 100000 UNIT/GM EX POWD: 1.0000 | Freq: Three times a day (TID) | CUTANEOUS | 1 refills | Status: DC

## 2023-10-10 MED ORDER — FLUCONAZOLE 150 MG PO TABS
150.0000 mg | ORAL_TABLET | Freq: Every day | ORAL | 0 refills | Status: DC
Start: 2023-10-10 — End: 2023-10-22

## 2023-10-10 NOTE — Patient Instructions (Addendum)
It was a pleasure meeting you today. Thank you for allowing me to take part in your health care.  Our goals for today as we discussed include:   Stop Magnesium if no improvement in 3-4 days stop Metformin.  If no improvement restart Metformin.  If the diarrhea stops after stopping Metformin then we will need to start you on different medication for diabetes.  Modify diet as discussed  Monitor blood glucose.  If greater than 180 notify MD  Follow up in 2 weeks  This is a list of the screening recommended for you and due dates:  Health Maintenance  Topic Date Due   Medicare Annual Wellness Visit  11/27/2023   COVID-19 Vaccine (3 - Moderna risk series) 10/20/2023*   Eye exam for diabetics  10/11/2023   Yearly kidney health urinalysis for diabetes  02/05/2024   Hemoglobin A1C  03/10/2024   Complete foot exam   08/01/2024   Yearly kidney function blood test for diabetes  09/10/2024   DTaP/Tdap/Td vaccine (5 - Td or Tdap) 09/23/2024   Colon Cancer Screening  02/12/2028   DEXA scan (bone density measurement)  04/16/2033   Pneumonia Vaccine  Completed   Flu Shot  Completed   Hepatitis C Screening  Completed   Zoster (Shingles) Vaccine  Completed   HPV Vaccine  Aged Out  *Topic was postponed. The date shown is not the original due date.      If you have any questions or concerns, please do not hesitate to call the office at [phone].  I look forward to our next visit and until then take care and stay safe.  Regards,   Dana Allan, MD   [facility]

## 2023-10-10 NOTE — Progress Notes (Signed)
SUBJECTIVE:   Chief Complaint  Patient presents with   Diarrhea    Since Aug. Not every day.   Rash    Under stomach X 1 week   HPI Presents for acute visit  Discussed the use of AI scribe software for clinical note transcription with the patient, who gave verbal consent to proceed.  History of Present Illness Stacie Porter is a 78 year old female with diabetes who presents with chronic diarrhea.  She experiences chronic diarrhea, which she attributes to various factors including her medication and dietary habits. The diarrhea began after an episode of food poisoning and the initiation of Mounjaro. Episodes occur unpredictably, sometimes after consuming foods like pizza, and can be urgent, making it difficult to reach the bathroom in time. She uses Imodium for temporary relief.  She has been on metformin for a long time, initially at a higher dose which was reduced to 5 mg twice a day due to diarrhea. She acknowledges that metformin can cause diarrhea, especially when consuming carbohydrates. She also takes [place], which she believes helps manage her blood sugar levels, but has experienced occasional itching, which she initially thought was a yeast infection. She uses magnesium supplements over the counter due to low magnesium levels, but is aware that magnesium can also cause diarrhea. She recalls a previous stool sample workup that was normal and mentions stopping Tamiflu due to diarrhea.  Her dietary habits include consuming a small bagel, [place] bacon, and fruit like strawberries, blueberries, and apples for breakfast. She drinks light orange juice, unaware of its high carbohydrate content, which may contribute to her symptoms.  She reports a rash resembling a diaper rash in the lower abdominal area, which is itchy and persistent despite using cornstarch and other powders. She has a history of similar rashes, typically occurring in the summer, and has used various creams in the  past.  No abdominal pain, blood in stool, vomiting, or changes in appetite.    PERTINENT PMH / PSH: As above  OBJECTIVE:  BP 136/62   Pulse 67   Temp 97.9 F (36.6 C)   Resp 18   Ht 5\' 4"  (1.626 m)   Wt 214 lb (97.1 kg)   SpO2 98%   BMI 36.73 kg/m    Physical Exam Vitals reviewed.  Constitutional:      General: She is not in acute distress.    Appearance: Normal appearance. She is obese. She is not ill-appearing, toxic-appearing or diaphoretic.  Eyes:     General:        Right eye: No discharge.        Left eye: No discharge.     Conjunctiva/sclera: Conjunctivae normal.  Cardiovascular:     Rate and Rhythm: Normal rate.  Pulmonary:     Effort: Pulmonary effort is normal.  Abdominal:     General: Bowel sounds are normal. There is no distension.     Palpations: Abdomen is soft. There is no mass.     Tenderness: There is no abdominal tenderness. There is no guarding.  Musculoskeletal:        General: Normal range of motion.  Skin:    General: Skin is warm and dry.  Neurological:     General: No focal deficit present.     Mental Status: She is alert and oriented to person, place, and time. Mental status is at baseline.  Psychiatric:        Mood and Affect: Mood normal.  Behavior: Behavior normal.        Thought Content: Thought content normal.        Judgment: Judgment normal.           10/10/2023    1:38 PM 10/04/2023   12:40 PM 09/11/2023    1:33 PM 08/02/2023    8:51 AM 05/18/2023    7:58 AM  Depression screen PHQ 2/9  Decreased Interest 0 0 0 0 0  Down, Depressed, Hopeless 0 0 0 0 0  PHQ - 2 Score 0 0 0 0 0  Altered sleeping 0 0 0 0 0  Tired, decreased energy 0 0 0 0 0  Change in appetite 0 0 0 0 0  Feeling bad or failure about yourself  0 0 0 0 0  Trouble concentrating 0 0 0 0 0  Moving slowly or fidgety/restless 0 0 0 0 0  Suicidal thoughts 0 0 0 0 0  PHQ-9 Score 0 0 0 0 0  Difficult doing work/chores Not difficult at all Not difficult at  all Not difficult at all Not difficult at all Not difficult at all      10/10/2023    1:38 PM 10/04/2023   12:40 PM 09/11/2023    1:33 PM 08/02/2023    8:51 AM  GAD 7 : Generalized Anxiety Score  Nervous, Anxious, on Edge 0 0 0 0  Control/stop worrying 0 0 0 0  Worry too much - different things 0 0 0 0  Trouble relaxing 0 0 0 0  Restless 0 0 0 0  Easily annoyed or irritable 0 0 0 0  Afraid - awful might happen 0 0 0 0  Total GAD 7 Score 0 0 0 0  Anxiety Difficulty Not difficult at all Not difficult at all Not difficult at all Not difficult at all    ASSESSMENT/PLAN:  Diarrhea, unspecified type Assessment & Plan: Has improved today. Recurrent episodes of diarrhea, possibly related to Metformin and dietary factors. No abdominal pain or blood in stool. -Stop Magnesium supplements and Metformin.  Monitor for changes in bowel habits. -Check magnesium levels  -Advise patient to monitor dietary intake, particularly high carbohydrate and high glycemic index foods. -Plan for GI consult if no improvement with above recommendations.  Orders: -     Comprehensive metabolic panel -     Magnesium -     CBC with Differential/Platelet -     TSH  Type 2 diabetes mellitus with complications (HCC) Assessment & Plan: Currently managed with Metformin and Farxiga. Did not tolerate Mounjaro previously. -Last A1c 7.6.  Goal 7.0-7.5 for age -Stop Metformin due to possible contribution to diarrhea. -Monitor blood glucose levels and consider adjusting Farxiga dose or adding alternative medication if levels increase.   Intertrigo Assessment & Plan: Patient reports itching and rash in skin folds. -Prescribe Nystatin powder for topical application. -Advise patient on skin care measures to keep area dry. -If no improvement with powder, patient to take Diflucan pill.  Orders: -     Nystatin; Apply 1 Application topically 3 (three) times daily.  Dispense: 15 g; Refill: 1 -     Fluconazole; Take 1  tablet (150 mg total) by mouth daily.  Dispense: 1 tablet; Refill: 0    PDMP reviewed  Return in about 2 weeks (around 10/24/2023) for PCP.  Dana Allan, MD

## 2023-10-11 LAB — CBC WITH DIFFERENTIAL/PLATELET
Basophils Absolute: 0.1 10*3/uL (ref 0.0–0.1)
Basophils Relative: 0.6 % (ref 0.0–3.0)
Eosinophils Absolute: 0.1 10*3/uL (ref 0.0–0.7)
Eosinophils Relative: 0.7 % (ref 0.0–5.0)
HCT: 38.2 % (ref 36.0–46.0)
Hemoglobin: 13.1 g/dL (ref 12.0–15.0)
Lymphocytes Relative: 21.3 % (ref 12.0–46.0)
Lymphs Abs: 1.9 10*3/uL (ref 0.7–4.0)
MCHC: 34.3 g/dL (ref 30.0–36.0)
MCV: 97.7 fL (ref 78.0–100.0)
Monocytes Absolute: 0.8 10*3/uL (ref 0.1–1.0)
Monocytes Relative: 9 % (ref 3.0–12.0)
Neutro Abs: 6.2 10*3/uL (ref 1.4–7.7)
Neutrophils Relative %: 68.4 % (ref 43.0–77.0)
Platelets: 124 10*3/uL — ABNORMAL LOW (ref 150.0–400.0)
RBC: 3.91 Mil/uL (ref 3.87–5.11)
RDW: 13.7 % (ref 11.5–15.5)
WBC: 9.1 10*3/uL (ref 4.0–10.5)

## 2023-10-11 LAB — COMPREHENSIVE METABOLIC PANEL
ALT: 12 U/L (ref 0–35)
AST: 14 U/L (ref 0–37)
Albumin: 4.2 g/dL (ref 3.5–5.2)
Alkaline Phosphatase: 49 U/L (ref 39–117)
BUN: 19 mg/dL (ref 6–23)
CO2: 28 meq/L (ref 19–32)
Calcium: 9.5 mg/dL (ref 8.4–10.5)
Chloride: 103 meq/L (ref 96–112)
Creatinine, Ser: 0.89 mg/dL (ref 0.40–1.20)
GFR: 62.71 mL/min (ref >60.00)
Glucose, Bld: 167 mg/dL — ABNORMAL HIGH (ref 70–99)
Potassium: 4.5 meq/L (ref 3.5–5.1)
Sodium: 142 meq/L (ref 135–145)
Total Bilirubin: 0.7 mg/dL (ref 0.2–1.2)
Total Protein: 6.4 g/dL (ref 6.0–8.3)

## 2023-10-11 LAB — TSH: TSH: 0.82 u[IU]/mL (ref 0.35–5.50)

## 2023-10-11 LAB — MAGNESIUM: Magnesium: 1.5 mg/dL (ref 1.5–2.5)

## 2023-10-15 ENCOUNTER — Encounter: Payer: Self-pay | Admitting: Family Medicine

## 2023-10-15 DIAGNOSIS — L304 Erythema intertrigo: Secondary | ICD-10-CM | POA: Insufficient documentation

## 2023-10-15 DIAGNOSIS — B379 Candidiasis, unspecified: Secondary | ICD-10-CM | POA: Insufficient documentation

## 2023-10-15 NOTE — Telephone Encounter (Addendum)
Pt requesting a GI referral to Dr Servando Snare or Dr Kathryne Hitch for Diarrhea. Referral pended below.

## 2023-10-15 NOTE — Assessment & Plan Note (Signed)
Patient reports itching and rash in skin folds. -Prescribe Nystatin powder for topical application. -Advise patient on skin care measures to keep area dry. -If no improvement with powder, patient to take Diflucan pill.

## 2023-10-15 NOTE — Assessment & Plan Note (Signed)
Has improved today. Recurrent episodes of diarrhea, possibly related to Metformin and dietary factors. No abdominal pain or blood in stool. -Stop Magnesium supplements and Metformin.  Monitor for changes in bowel habits. -Check magnesium levels  -Advise patient to monitor dietary intake, particularly high carbohydrate and high glycemic index foods. -Plan for GI consult if no improvement with above recommendations.

## 2023-10-15 NOTE — Assessment & Plan Note (Addendum)
Currently managed with Metformin and [place]. Did not tolerate Mounjaro previously. -Last A1c 7.6.  Goal 7.0-7.5 for age -Stop Metformin due to possible contribution to diarrhea. -Monitor blood glucose levels and consider adjusting Farxiga dose or adding alternative medication if levels increase.

## 2023-10-19 ENCOUNTER — Other Ambulatory Visit: Payer: Self-pay

## 2023-10-19 DIAGNOSIS — R197 Diarrhea, unspecified: Secondary | ICD-10-CM

## 2023-10-19 NOTE — Telephone Encounter (Signed)
 Referral has been placed.

## 2023-10-22 ENCOUNTER — Ambulatory Visit (INDEPENDENT_AMBULATORY_CARE_PROVIDER_SITE_OTHER): Payer: Medicare Other | Admitting: Family Medicine

## 2023-10-22 ENCOUNTER — Encounter: Payer: Self-pay | Admitting: Family Medicine

## 2023-10-22 VITALS — BP 134/74 | HR 67 | Temp 97.9°F | Resp 18 | Ht 64.0 in | Wt 212.4 lb

## 2023-10-22 DIAGNOSIS — L304 Erythema intertrigo: Secondary | ICD-10-CM

## 2023-10-22 DIAGNOSIS — E118 Type 2 diabetes mellitus with unspecified complications: Secondary | ICD-10-CM | POA: Diagnosis not present

## 2023-10-22 DIAGNOSIS — Z7984 Long term (current) use of oral hypoglycemic drugs: Secondary | ICD-10-CM | POA: Diagnosis not present

## 2023-10-22 MED ORDER — RYBELSUS 3 MG PO TABS: 3.0000 mg | ORAL_TABLET | Freq: Every day | ORAL | Status: DC

## 2023-10-22 MED ORDER — RYBELSUS 3 MG PO TABS: 3.0000 mg | ORAL_TABLET | Freq: Every day | ORAL | 1 refills | Status: DC

## 2023-10-22 NOTE — Progress Notes (Signed)
 SUBJECTIVE:   Chief Complaint  Patient presents with   Blood Sugar Problem    Running high since stopping Metformin   HPI Presents for acute visit  Discussed the use of AI scribe software for clinical note transcription with the patient, who gave verbal consent to proceed.  History of Present Illness Stacie Porter is a 78 year old female with type 2 diabetes who presents with elevated blood sugar levels and concerns about medication side effects.  She has been experiencing elevated blood sugar levels, around 200 mg/dL, after discontinuing metformin due to episodes of diarrhea. She stopped taking metformin last Wednesday night and has not experienced diarrhea since. However, her blood sugar levels remain high.  Currently, she is taking Farxiga 10 mg daily, which she notes does not effectively lower her blood sugar but is beneficial for her kidneys. She previously tried [place] but experienced severe gastrointestinal side effects, including diarrhea and vomiting, particularly during the second week of use, describing it as 'the sickest I have ever been.'  She is concerned about trying new medications that might cause diarrhea, as she had negative experiences with metformin and Mounjaro. 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  She mentions a history of food poisoning, which she believes may have exacerbated her gastrointestinal symptoms. Since the food poisoning, her appetite has decreased, and she is not as hungry as before. No use of steroids and her yeast infection has cleared up.    PERTINENT PMH / PSH: As above  OBJECTIVE:  BP 134/74   Pulse 67   Temp 97.9 F (36.6 C)   Resp 18   Ht 5\' 4"  (1.626 m)   Wt 212 lb 6 oz (96.3 kg)   SpO2 99%   BMI 36.45 kg/m    Physical Exam Vitals reviewed.  Constitutional:      General: She is not  in acute distress.    Appearance: Normal appearance. She is obese. She is not ill-appearing, toxic-appearing or diaphoretic.  Eyes:     General:        Right eye: No discharge.        Left eye: No discharge.     Conjunctiva/sclera: Conjunctivae normal.  Cardiovascular:     Rate and Rhythm: Normal rate.  Pulmonary:     Effort: Pulmonary effort is normal.  Abdominal:     General: Bowel sounds are normal.  Musculoskeletal:        General: Normal range of motion.  Skin:    General: Skin is warm and dry.  Neurological:     General: No focal deficit present.     Mental Status: She is alert and oriented to person, place, and time. Mental status is at baseline.  Psychiatric:        Mood and Affect: Mood normal.        Behavior: Behavior normal.        Thought Content: Thought content normal.        Judgment: Judgment normal.           10/22/2023   11:38 AM 10/10/2023    1:38 PM 10/04/2023   12:40 PM 09/11/2023    1:33 PM 08/02/2023    8:51 AM  Depression screen PHQ 2/9  Decreased Interest 0 0 0 0 0  Down, Depressed, Hopeless 0 0 0 0 0  PHQ - 2 Score 0 0 0 0  0  Altered sleeping 0 0 0 0 0  Tired, decreased energy 0 0 0 0 0  Change in appetite 0 0 0 0 0  Feeling bad or failure about yourself  0 0 0 0 0  Trouble concentrating 0 0 0 0 0  Moving slowly or fidgety/restless 0 0 0 0 0  Suicidal thoughts 0 0 0 0 0  PHQ-9 Score 0 0 0 0 0  Difficult doing work/chores Not difficult at all Not difficult at all Not difficult at all Not difficult at all Not difficult at all      10/22/2023   11:38 AM 10/10/2023    1:38 PM 10/04/2023   12:40 PM 09/11/2023    1:33 PM  GAD 7 : Generalized Anxiety Score  Nervous, Anxious, on Edge 0 0 0 0  Control/stop worrying 0 0 0 0  Worry too much - different things 0 0 0 0  Trouble relaxing 0 0 0 0  Restless 0 0 0 0  Easily annoyed or irritable 0 0 0 0  Afraid - awful might happen 0 0 0 0  Total GAD 7 Score 0 0 0 0  Anxiety Difficulty Not difficult at  all Not difficult at all Not difficult at all Not difficult at all    ASSESSMENT/PLAN:  Type 2 diabetes mellitus with complications (HCC) Assessment & Plan: Elevated blood glucose levels following discontinuation of Metformin due to diarrhea. Patient currently on Farxiga 10mg . Discussed options for additional medications, including Rybelsus, Glipizide, and insulin. Patient expressed concern about potential for diarrhea with new medications. -Discontinue Metformin and Magnesium due to diarrhea. -Initiate Rybelsus 3mg  daily, starting today. -Check blood glucose levels regularly and monitor for side effects, particularly diarrhea. -Plan to follow up with pharmacist Lillia Abed for additional management options if Rybelsus is not tolerated or effective. -Continue Farxiga 10mg  daily for kidney protection. -Follow-up appointment scheduled for November 02, 2023.  Orders: -     Rybelsus; Take 1 tablet (3 mg total) by mouth daily. -     Rybelsus; Take 1 tablet (3 mg total) by mouth daily.  Dispense: 30 tablet; Refill: 1  Intertrigo Assessment & Plan: Resolved. -No further treatment needed.      PDMP reviewed  Return in about 11 days (around 11/02/2023).  Dana Allan, MD

## 2023-10-22 NOTE — Patient Instructions (Signed)
 It was a pleasure meeting you today. Thank you for allowing me to take part in your health care.  Our goals for today as we discussed include:  Glad the diarrhea has stopped  Stop Metformin  Start Rybelsus 3 mg daily If any side effects notify MD  Follow up as scheduled   This is a list of the screening recommended for you and due dates:  Health Maintenance  Topic Date Due   COVID-19 Vaccine (3 - Moderna risk series) 12/04/2019   Eye exam for diabetics  10/11/2023   Medicare Annual Wellness Visit  11/27/2023   Yearly kidney health urinalysis for diabetes  02/05/2024   Hemoglobin A1C  03/10/2024   Complete foot exam   08/01/2024   DTaP/Tdap/Td vaccine (5 - Td or Tdap) 09/23/2024   Yearly kidney function blood test for diabetes  10/09/2024   Colon Cancer Screening  02/12/2028   DEXA scan (bone density measurement)  04/16/2033   Pneumonia Vaccine  Completed   Flu Shot  Completed   Hepatitis C Screening  Completed   Zoster (Shingles) Vaccine  Completed   HPV Vaccine  Aged Out      If you have any questions or concerns, please do not hesitate to call the office at [phone].  I look forward to our next visit and until then take care and stay safe.  Regards,   Dana Allan, MD   [facility]

## 2023-10-23 ENCOUNTER — Other Ambulatory Visit (HOSPITAL_COMMUNITY): Payer: Self-pay

## 2023-10-23 ENCOUNTER — Telehealth: Payer: Self-pay

## 2023-10-23 NOTE — Telephone Encounter (Signed)
 Pharmacy Patient Advocate Encounter   Received notification from CoverMyMeds that prior authorization for Rybelsus 3MG  tablets is required/requested.   Insurance verification completed.   The patient is insured through [facility] .   Per test claim: PA required; PA submitted to above mentioned insurance via CoverMyMeds Key/confirmation #/EOC [facility] Status is pending

## 2023-10-23 NOTE — Telephone Encounter (Signed)
 Pharmacy Patient Advocate Encounter  Received notification from [facility] that Prior Authorization for Rybelsus 3MG  tablets  has been APPROVED from 0126/25 to 10/22/24. Unable to obtain price due to refill too soon rejection, last fill date 10/23/23 next available fill date03/20/25   PA #/Case ID/Reference #: [facility]

## 2023-10-28 ENCOUNTER — Encounter: Payer: Self-pay | Admitting: Family Medicine

## 2023-10-28 NOTE — Assessment & Plan Note (Signed)
 Elevated blood glucose levels following discontinuation of Metformin due to diarrhea. Patient currently on Farxiga 10mg . Discussed options for additional medications, including Rybelsus, Glipizide, and insulin. Patient expressed concern about potential for diarrhea with new medications. -Discontinue Metformin and Magnesium due to diarrhea. -Initiate Rybelsus 3mg  daily, starting today. -Check blood glucose levels regularly and monitor for side effects, particularly diarrhea. -Plan to follow up with pharmacist Lillia Abed for additional management options if Rybelsus is not tolerated or effective. -Continue Farxiga 10mg  daily for kidney protection. -Follow-up appointment scheduled for November 02, 2023.

## 2023-10-28 NOTE — Assessment & Plan Note (Signed)
Resolved.  No further treatment needed.

## 2023-11-02 ENCOUNTER — Ambulatory Visit (INDEPENDENT_AMBULATORY_CARE_PROVIDER_SITE_OTHER): Payer: Federal, State, Local not specified - PPO | Admitting: Family Medicine

## 2023-11-02 ENCOUNTER — Encounter: Payer: Self-pay | Admitting: Family Medicine

## 2023-11-02 VITALS — BP 120/60 | HR 75 | Temp 98.2°F | Resp 20 | Ht 64.0 in | Wt 212.1 lb

## 2023-11-02 DIAGNOSIS — Z7984 Long term (current) use of oral hypoglycemic drugs: Secondary | ICD-10-CM

## 2023-11-02 DIAGNOSIS — E118 Type 2 diabetes mellitus with unspecified complications: Secondary | ICD-10-CM

## 2023-11-02 DIAGNOSIS — I152 Hypertension secondary to endocrine disorders: Secondary | ICD-10-CM | POA: Diagnosis not present

## 2023-11-02 DIAGNOSIS — E1159 Type 2 diabetes mellitus with other circulatory complications: Secondary | ICD-10-CM

## 2023-11-02 MED ORDER — RYBELSUS 7 MG PO TABS: 7.0000 mg | ORAL_TABLET | Freq: Every day | ORAL | 3 refills | Status: DC

## 2023-11-02 NOTE — Progress Notes (Signed)
 SUBJECTIVE:   Chief Complaint  Patient presents with   Diabetes    2 week follow up   HPI Presents for follow up chronic disease management  Discussed the use of AI scribe software for clinical note transcription with the patient, who gave verbal consent to proceed.  History of Present Illness The patient presents for a follow-up regarding diabetes management.  She is currently managing her diabetes with Rybelsus and [place]. Her blood sugar levels are often over 200 mg/dL, especially at night after eating, with an average of 187 mg/dL. She has not experienced diarrhea since starting Rybelsus two weeks ago and is tolerating it well.  She has received a letter from her insurance company regarding prior approval for Marcelline Deist and is confused about the insurance requirements for the 3 mg dose of Rybelsus, as she has not picked up the medication from the pharmacy yet. She plans to pick up the 3 mg dose to ensure insurance approval for the increased dose.  She discusses her diet, mentioning that she used to attend [employer] and is mindful of her carbohydrate intake. She describes a banana pancake recipe she tried, noting that she only eats one small pancake with [place] bacon for breakfast. She is aware of the high glycemic index of certain fruits and is cautious about her sugar intake.      PERTINENT PMH / PSH: As above  OBJECTIVE:  BP 120/60   Pulse 75   Temp 98.2 F (36.8 C)   Resp 20   Ht 5\' 4"  (1.626 m)   Wt 212 lb 2 oz (96.2 kg)   SpO2 98%   BMI 36.41 kg/m    Physical Exam Vitals reviewed.  Constitutional:      General: She is not in acute distress.    Appearance: Normal appearance. She is obese. She is not ill-appearing, toxic-appearing or diaphoretic.  Eyes:     General:        Right eye: No discharge.        Left eye: No discharge.     Conjunctiva/sclera: Conjunctivae normal.  Cardiovascular:     Rate and Rhythm: Normal rate.  Pulmonary:     Effort:  Pulmonary effort is normal.  Musculoskeletal:        General: Normal range of motion.  Skin:    General: Skin is warm and dry.  Neurological:     General: No focal deficit present.     Mental Status: She is alert and oriented to person, place, and time. Mental status is at baseline.  Psychiatric:        Mood and Affect: Mood normal.        Behavior: Behavior normal.        Thought Content: Thought content normal.        Judgment: Judgment normal.           11/02/2023    1:03 PM 10/22/2023   11:38 AM 10/10/2023    1:38 PM 10/04/2023   12:40 PM 09/11/2023    1:33 PM  Depression screen PHQ 2/9  Decreased Interest 0 0 0 0 0  Down, Depressed, Hopeless 0 0 0 0 0  PHQ - 2 Score 0 0 0 0 0  Altered sleeping 0 0 0 0 0  Tired, decreased energy 0 0 0 0 0  Change in appetite 0 0 0 0 0  Feeling bad or failure about yourself  0 0 0 0 0  Trouble concentrating 0 0 0 0  0  Moving slowly or fidgety/restless 0 0 0 0 0  Suicidal thoughts 0 0 0 0 0  PHQ-9 Score 0 0 0 0 0  Difficult doing work/chores Not difficult at all Not difficult at all Not difficult at all Not difficult at all Not difficult at all      11/02/2023    1:03 PM 10/22/2023   11:38 AM 10/10/2023    1:38 PM 10/04/2023   12:40 PM  GAD 7 : Generalized Anxiety Score  Nervous, Anxious, on Edge 0 0 0 0  Control/stop worrying 0 0 0 0  Worry too much - different things 0 0 0 0  Trouble relaxing 0 0 0 0  Restless 0 0 0 0  Easily annoyed or irritable 0 0 0 0  Afraid - awful might happen 0 0 0 0  Total GAD 7 Score 0 0 0 0  Anxiety Difficulty Not difficult at all Not difficult at all Not difficult at all Not difficult at all    ASSESSMENT/PLAN:  Type 2 diabetes mellitus with complications (HCC) Assessment & Plan: Suboptimal glycemic control with average glucose of 187. Tolerating Rybelsus 3mg  without gastrointestinal side effects. Discussed the need to increase the dose to improve glycemic control. -Increase Rybelsus to 7mg  daily.  Patient to take two 3mg  tablets until 7mg  prescription is obtained. -Continue Farxiga 10 mg daily -Encouraged dietary modifications to reduce carbohydrate intake. -Check A1C in June 2025. -Consider further increase in Rybelsus dose if sugars remain high after one month on 7mg  daily.   Orders: -     Rybelsus; Take 1 tablet (7 mg total) by mouth daily.  Dispense: 30 tablet; Refill: 3  Hypertension associated with diabetes (HCC) Assessment & Plan: Well controlled on current medication -Continue Lisinopril 20 mg daily -Continue to monitor BP at home     PDMP reviewed  Return in about 3 months (around 02/02/2024) for PCP, DM.  Dana Allan, MD

## 2023-11-02 NOTE — Patient Instructions (Addendum)
 It was a pleasure meeting you today. Thank you for allowing me to take part in your health care.  Our goals for today as we discussed include:  Increase Rybelsus to 7 mg daily  Monitor blood glucose.  If greater than 300 notify MD.  If greater than 400 and symptomatic go to Emergency.  Follow up in 3 months   This is a list of the screening recommended for you and due dates:  Health Maintenance  Topic Date Due   COVID-19 Vaccine (3 - Moderna risk series) 12/04/2019   Eye exam for diabetics  10/11/2023   Medicare Annual Wellness Visit  11/27/2023   Yearly kidney health urinalysis for diabetes  02/05/2024   Hemoglobin A1C  03/10/2024   Complete foot exam   08/01/2024   DTaP/Tdap/Td vaccine (5 - Td or Tdap) 09/23/2024   Yearly kidney function blood test for diabetes  10/09/2024   Colon Cancer Screening  02/12/2028   DEXA scan (bone density measurement)  04/16/2033   Pneumonia Vaccine  Completed   Flu Shot  Completed   Hepatitis C Screening  Completed   Zoster (Shingles) Vaccine  Completed   HPV Vaccine  Aged Out     If you have any questions or concerns, please do not hesitate to call the office at [phone].  I look forward to our next visit and until then take care and stay safe.  Regards,   Dana Allan, MD   [facility]

## 2023-11-07 ENCOUNTER — Ambulatory Visit: Payer: Federal, State, Local not specified - PPO | Admitting: Family Medicine

## 2023-11-07 ENCOUNTER — Encounter: Payer: Self-pay | Admitting: Family Medicine

## 2023-11-07 NOTE — Assessment & Plan Note (Signed)
 Suboptimal glycemic control with average glucose of 187. Tolerating Rybelsus 3mg  without gastrointestinal side effects. Discussed the need to increase the dose to improve glycemic control. -Increase Rybelsus to 7mg  daily. Patient to take two 3mg  tablets until 7mg  prescription is obtained. -Continue Farxiga 10 mg daily -Encouraged dietary modifications to reduce carbohydrate intake. -Check A1C in June 2025. -Consider further increase in Rybelsus dose if sugars remain high after one month on 7mg  daily.

## 2023-11-07 NOTE — Assessment & Plan Note (Signed)
 Well controlled on current medication -Continue Lisinopril 20 mg daily -Continue to monitor BP at home

## 2023-11-18 ENCOUNTER — Encounter: Payer: Self-pay | Admitting: Family Medicine

## 2023-11-19 ENCOUNTER — Encounter: Payer: Self-pay | Admitting: Family Medicine

## 2023-11-19 ENCOUNTER — Ambulatory Visit (INDEPENDENT_AMBULATORY_CARE_PROVIDER_SITE_OTHER): Admitting: Family Medicine

## 2023-11-19 VITALS — BP 130/74 | HR 71 | Temp 98.2°F | Resp 20 | Ht 64.0 in | Wt 210.0 lb

## 2023-11-19 DIAGNOSIS — E118 Type 2 diabetes mellitus with unspecified complications: Secondary | ICD-10-CM

## 2023-11-19 DIAGNOSIS — E1159 Type 2 diabetes mellitus with other circulatory complications: Secondary | ICD-10-CM | POA: Diagnosis not present

## 2023-11-19 DIAGNOSIS — I152 Hypertension secondary to endocrine disorders: Secondary | ICD-10-CM | POA: Diagnosis not present

## 2023-11-19 DIAGNOSIS — Z7984 Long term (current) use of oral hypoglycemic drugs: Secondary | ICD-10-CM

## 2023-11-19 NOTE — Patient Instructions (Signed)
 It was a pleasure meeting you today. Thank you for allowing me to take part in your health care.  Our goals for today as we discussed include:  Schedule nurse appointment to check blood pressure monitor checked  Check blood pressure less often and only when symptomatic  Follow up in June as schedule   This is a list of the screening recommended for you and due dates:  Health Maintenance  Topic Date Due   COVID-19 Vaccine (3 - Moderna risk series) 12/04/2019   Eye exam for diabetics  10/11/2023   Medicare Annual Wellness Visit  11/27/2023   Yearly kidney health urinalysis for diabetes  02/05/2024   Hemoglobin A1C  03/10/2024   Complete foot exam   08/01/2024   DTaP/Tdap/Td vaccine (5 - Td or Tdap) 09/23/2024   Yearly kidney function blood test for diabetes  10/09/2024   Colon Cancer Screening  02/12/2028   DEXA scan (bone density measurement)  04/16/2033   Pneumonia Vaccine  Completed   Flu Shot  Completed   Hepatitis C Screening  Completed   Zoster (Shingles) Vaccine  Completed   HPV Vaccine  Aged Out      If you have any questions or concerns, please do not hesitate to call the office at [phone].  I look forward to our next visit and until then take care and stay safe.  Regards,   Dana Allan, MD   [facility]

## 2023-11-19 NOTE — Telephone Encounter (Signed)
 Got pt scheduled for Bp issues.

## 2023-11-19 NOTE — Progress Notes (Signed)
 SUBJECTIVE:  No chief complaint on file.  HPI Presents for acute visit  Discussed the use of AI scribe software for clinical note transcription with the patient, who gave verbal consent to proceed.  History of Present Illness Stacie Porter is a 78 year old female with atrial fibrillation who presents with concerns about fluctuating blood pressure readings. She is accompanied by her husband and daughter.  She has been experiencing significant fluctuations in her blood pressure readings throughout the day, with values ranging from 164/83 mmHg to 109/63 mmHg. During these episodes, she feels 'funny' and 'weird', prompting frequent blood pressure checks. Despite sitting down and relaxing, her pulse rate remains elevated. No chest pain, shortness of breath, headaches, double vision, or abdominal pain are reported during these episodes.  She has a history of atrial fibrillation and underwent catheter ablation. However, she does not believe her current symptoms resemble her past atrial fibrillation episodes. She denies palpitations or fluttering but describes a general sense of not feeling right.  She is currently taking Rybelsus for diabetes management, which she started two weeks ago. Her blood sugar levels have averaged 166 mg/dL over the past week. She has noticed a decrease in appetite since starting the medication.  Her dietary intake on the day of the symptoms included macaroni salad with ham, cucumbers, and light mayonnaise, along with crackers. She acknowledges a lack of substantial protein in her meals and has not been very hungry lately. A history of food poisoning has also affected her appetite.    PERTINENT PMH / PSH: As above  OBJECTIVE:  BP 130/74   Pulse 71   Temp 98.2 F (36.8 C)   Resp 20   Ht 5\' 4"  (1.626 m)   Wt 210 lb (95.3 kg)   SpO2 99%   BMI 36.05 kg/m    Physical Exam Vitals reviewed.  Constitutional:      General: She is not in acute distress.     Appearance: Normal appearance. She is normal weight. She is not ill-appearing, toxic-appearing or diaphoretic.  Eyes:     General:        Right eye: No discharge.        Left eye: No discharge.     Conjunctiva/sclera: Conjunctivae normal.  Cardiovascular:     Rate and Rhythm: Normal rate and regular rhythm.     Heart sounds: Normal heart sounds.  Pulmonary:     Effort: Pulmonary effort is normal.     Breath sounds: Normal breath sounds.  Abdominal:     General: Bowel sounds are normal.  Skin:    General: Skin is warm and dry.  Neurological:     General: No focal deficit present.     Mental Status: She is alert and oriented to person, place, and time. Mental status is at baseline.  Psychiatric:        Mood and Affect: Mood normal.        Behavior: Behavior normal.        Thought Content: Thought content normal.        Judgment: Judgment normal.           11/19/2023    4:14 PM 11/02/2023    1:03 PM 10/22/2023   11:38 AM 10/10/2023    1:38 PM 10/04/2023   12:40 PM  Depression screen PHQ 2/9  Decreased Interest 0 0 0 0 0  Down, Depressed, Hopeless 0 0 0 0 0  PHQ - 2 Score 0 0 0 0 0  Altered sleeping 0 0 0 0 0  Tired, decreased energy 0 0 0 0 0  Change in appetite 0 0 0 0 0  Feeling bad or failure about yourself  0 0 0 0 0  Trouble concentrating 0 0 0 0 0  Moving slowly or fidgety/restless 0 0 0 0 0  Suicidal thoughts 0 0 0 0 0  PHQ-9 Score 0 0 0 0 0  Difficult doing work/chores Not difficult at all Not difficult at all Not difficult at all Not difficult at all Not difficult at all      11/19/2023    4:15 PM 11/02/2023    1:03 PM 10/22/2023   11:38 AM 10/10/2023    1:38 PM  GAD 7 : Generalized Anxiety Score  Nervous, Anxious, on Edge 0 0 0 0  Control/stop worrying 0 0 0 0  Worry too much - different things 0 0 0 0  Trouble relaxing 0 0 0 0  Restless 0 0 0 0  Easily annoyed or irritable 0 0 0 0  Afraid - awful might happen 0 0 0 0  Total GAD 7 Score 0 0 0 0  Anxiety  Difficulty Not difficult at all Not difficult at all Not difficult at all Not difficult at all    ASSESSMENT/PLAN:  Hypertension associated with diabetes [facility]) Assessment & Plan: Reports elevated blood pressure readings up to 164/83 mmHg, potentially influenced by anxiety and dietary sodium intake. No associated symptoms such as chest pain, shortness of breath, headaches, or visual disturbances. Advised against frequent monitoring to reduce anxiety-related elevation. Discussed dietary sodium and anxiety impact on blood pressure. - No change to current medication today given normotensive. - Bring in blood pressure cuff for calibration and accuracy check - Schedule a nurse appointment if blood pressure remains elevated - Monitor and reduce dietary sodium intake if necessary   Type 2 diabetes mellitus with complications (HCC) Assessment & Plan: Currently on Rybelsus with blood sugar levels averaging 166 mg/dL. Plans to increase Rybelsus to 14 mg for improved glycemic control, as she tolerates the current dose well. Advised to maintain adequate protein intake to prevent malnutrition and manage appetite suppression from medication. Discussed protein intake importance and provided examples of protein-rich foods. - Plan to increase Rybelsus to 14 mg at next visit or if blood glucose remains >150 - Ensure adequate protein intake in diet - Schedule follow-up appointment in June to assess A1c and overall diabetes management    PDMP reviewed  Return in about 3 months (around 02/19/2024) for PCP.  Dana Allan, MD

## 2023-11-25 ENCOUNTER — Encounter: Payer: Self-pay | Admitting: Family Medicine

## 2023-11-25 NOTE — Assessment & Plan Note (Signed)
 Reports elevated blood pressure readings up to 164/83 mmHg, potentially influenced by anxiety and dietary sodium intake. No associated symptoms such as chest pain, shortness of breath, headaches, or visual disturbances. Advised against frequent monitoring to reduce anxiety-related elevation. Discussed dietary sodium and anxiety impact on blood pressure. - No change to current medication today given normotensive. - Bring in blood pressure cuff for calibration and accuracy check - Schedule a nurse appointment if blood pressure remains elevated - Monitor and reduce dietary sodium intake if necessary

## 2023-11-25 NOTE — Assessment & Plan Note (Addendum)
 Currently on Rybelsus with blood sugar levels averaging 166 mg/dL. Plans to increase Rybelsus to 14 mg for improved glycemic control, as she tolerates the current dose well. Advised to maintain adequate protein intake to prevent malnutrition and manage appetite suppression from medication. Discussed protein intake importance and provided examples of protein-rich foods. - Plan to increase Rybelsus to 14 mg at next visit or if blood glucose remains >150 - Ensure adequate protein intake in diet - Schedule follow-up appointment in June to assess A1c and overall diabetes management

## 2023-11-26 LAB — HM DIABETES EYE EXAM

## 2023-12-11 ENCOUNTER — Encounter: Payer: Self-pay | Admitting: Family Medicine

## 2023-12-12 ENCOUNTER — Encounter: Payer: Self-pay | Admitting: Family Medicine

## 2023-12-13 ENCOUNTER — Other Ambulatory Visit: Payer: Self-pay | Admitting: Family Medicine

## 2023-12-13 DIAGNOSIS — L304 Erythema intertrigo: Secondary | ICD-10-CM

## 2023-12-13 MED ORDER — NYSTATIN 100000 UNIT/GM EX POWD: 1.0000 | Freq: Three times a day (TID) | CUTANEOUS | 6 refills | Status: DC

## 2023-12-13 NOTE — Telephone Encounter (Signed)
Pt notified and has picked up medication.

## 2023-12-21 ENCOUNTER — Ambulatory Visit (INDEPENDENT_AMBULATORY_CARE_PROVIDER_SITE_OTHER): Admitting: Family Medicine

## 2023-12-21 ENCOUNTER — Encounter: Payer: Self-pay | Admitting: Family Medicine

## 2023-12-21 VITALS — BP 126/62 | HR 64 | Temp 97.9°F | Ht 64.0 in

## 2023-12-21 DIAGNOSIS — E118 Type 2 diabetes mellitus with unspecified complications: Secondary | ICD-10-CM

## 2023-12-21 DIAGNOSIS — B379 Candidiasis, unspecified: Secondary | ICD-10-CM | POA: Diagnosis not present

## 2023-12-21 DIAGNOSIS — I152 Hypertension secondary to endocrine disorders: Secondary | ICD-10-CM | POA: Diagnosis not present

## 2023-12-21 DIAGNOSIS — E1159 Type 2 diabetes mellitus with other circulatory complications: Secondary | ICD-10-CM | POA: Diagnosis not present

## 2023-12-21 NOTE — Patient Instructions (Signed)
 It was a pleasure meeting you today. Thank you for allowing me to take part in your health care.  Our goals for today as we discussed include:  With improving symptoms no indication for continued treatment  Will see you in June for your follow up diabetes appointment    This is a list of the screening recommended for you and due dates:  Health Maintenance  Topic Date Due   COVID-19 Vaccine (3 - Moderna risk series) 12/04/2019   Eye exam for diabetics  10/11/2023   Medicare Annual Wellness Visit  11/27/2023   Yearly kidney health urinalysis for diabetes  02/05/2024   Hemoglobin A1C  03/10/2024   Flu Shot  03/28/2024   Complete foot exam   08/01/2024   DTaP/Tdap/Td vaccine (5 - Td or Tdap) 09/23/2024   Yearly kidney function blood test for diabetes  10/09/2024   Colon Cancer Screening  02/12/2028   DEXA scan (bone density measurement)  04/16/2033   Pneumonia Vaccine  Completed   Hepatitis C Screening  Completed   Zoster (Shingles) Vaccine  Completed   HPV Vaccine  Aged Out   Meningitis B Vaccine  Aged Out     If you have any questions or concerns, please do not hesitate to call the office at [phone].  I look forward to our next visit and until then take care and stay safe.  Regards,   Valli Gaw, MD   [facility]

## 2023-12-21 NOTE — Progress Notes (Signed)
 SUBJECTIVE:   Chief Complaint  Patient presents with   Vaginitis    C/O vaginal itch-tried Monistat, better today. Having surgery on Monday (shoulder surgery).   HPI Presents for acute visit  Discussed the use of AI scribe software for clinical note transcription with the patient, who gave verbal consent to proceed.  History of Present Illness Stacie Porter is a 78 year old female with diabetes and hypertension who presents with concerns about a recent yeast infection prior to her upcoming rotator cuff surgery.  She is scheduled for rotator cuff surgery on Monday and is concerned about managing a recent yeast infection before the procedure. She used an off-brand suppository similar to Monistat, which initially did not alleviate her symptoms of intense itching. However, the symptoms have since resolved, and she no longer experiences itching or discharge.  Her diabetes is managed with Rybelsus , which is working well, with improved blood sugar levels. Recent weekly blood sugar readings have been around 150 and 130. She has been on Rybelsus  for a couple of months and is pleased with the results. She stopped taking Farxiga  as of yesterday in preparation for her surgery.  Her hypertension is managed with metoprolol , which she will continue to take until the day of surgery, but she will not take lisinopril  on the morning of the procedure. Her blood pressure readings have been slightly elevated, possibly due to arm pain.  She received a cortisone injection in her arm at the end of December, which was four months ago. She has been experiencing arm pain, which she believes may be affecting her blood pressure readings.    PERTINENT PMH / PSH: As above  OBJECTIVE:  BP 126/62   Pulse 64   Temp 97.9 F (36.6 C) (Oral)   Ht 5\' 4"  (1.626 m)   SpO2 96%   BMI 36.05 kg/m    Physical Exam Vitals reviewed.  Constitutional:      General: She is not in acute distress.    Appearance: Normal  appearance. She is normal weight. She is not ill-appearing, toxic-appearing or diaphoretic.  Eyes:     General:        Right eye: No discharge.        Left eye: No discharge.     Conjunctiva/sclera: Conjunctivae normal.  Cardiovascular:     Rate and Rhythm: Normal rate.  Pulmonary:     Effort: Pulmonary effort is normal.  Skin:    General: Skin is warm and dry.  Neurological:     Mental Status: She is alert and oriented to person, place, and time. Mental status is at baseline.  Psychiatric:        Mood and Affect: Mood normal.        Behavior: Behavior normal.        Thought Content: Thought content normal.        Judgment: Judgment normal.           12/21/2023   11:05 AM 11/19/2023    4:14 PM 11/02/2023    1:03 PM 10/22/2023   11:38 AM 10/10/2023    1:38 PM  Depression screen PHQ 2/9  Decreased Interest 0 0 0 0 0  Down, Depressed, Hopeless 0 0 0 0 0  PHQ - 2 Score 0 0 0 0 0  Altered sleeping 0 0 0 0 0  Tired, decreased energy 0 0 0 0 0  Change in appetite 0 0 0 0 0  Feeling bad or failure about yourself  0 0 0 0 0  Trouble concentrating 0 0 0 0 0  Moving slowly or fidgety/restless 0 0 0 0 0  Suicidal thoughts 0 0 0 0 0  PHQ-9 Score 0 0 0 0 0  Difficult doing work/chores Not difficult at all Not difficult at all Not difficult at all Not difficult at all Not difficult at all      12/21/2023   11:05 AM 11/19/2023    4:15 PM 11/02/2023    1:03 PM 10/22/2023   11:38 AM  GAD 7 : Generalized Anxiety Score  Nervous, Anxious, on Edge 0 0 0 0  Control/stop worrying 0 0 0 0  Worry too much - different things 0 0 0 0  Trouble relaxing 0 0 0 0  Restless 0 0 0 0  Easily annoyed or irritable 0 0 0 0  Afraid - awful might happen 0 0 0 0  Total GAD 7 Score 0 0 0 0  Anxiety Difficulty Not difficult at all Not difficult at all Not difficult at all Not difficult at all    ASSESSMENT/PLAN:  Candida infection Assessment & Plan: Resolved with OTC monistat   Hypertension associated  with diabetes (HCC) Assessment & Plan: Managed with metoprolol  and lisinopril . Elevated BP likely due to arm pain. - Continue metoprolol  and lisinopril  - Recheck blood pressure normal   Type 2 diabetes mellitus with complications (HCC) Assessment & Plan: Managed with Rybelsus  and Farxiga . Blood glucose improved. Rybelsus  tolerated well. - Reassess Rybelsus  dosage in June and consider increasing to 14 mg. - Check A1c in June.    PDMP reviewed  Return in about 6 weeks (around 02/04/2024) for PCP, DM.  Valli Gaw, MD

## 2023-12-30 ENCOUNTER — Encounter: Payer: Self-pay | Admitting: Family Medicine

## 2023-12-30 NOTE — Assessment & Plan Note (Addendum)
 Managed with metoprolol  and lisinopril . Elevated BP likely due to arm pain. - Continue metoprolol  and lisinopril  - Recheck blood pressure normal

## 2023-12-30 NOTE — Assessment & Plan Note (Signed)
 Managed with Rybelsus  and Farxiga . Blood glucose improved. Rybelsus  tolerated well. - Reassess Rybelsus  dosage in June and consider increasing to 14 mg. - Check A1c in June.

## 2023-12-30 NOTE — Assessment & Plan Note (Signed)
 Resolved with OTC monistat

## 2024-01-01 ENCOUNTER — Ambulatory Visit (INDEPENDENT_AMBULATORY_CARE_PROVIDER_SITE_OTHER): Admitting: *Deleted

## 2024-01-01 VITALS — Ht 64.0 in | Wt 214.0 lb

## 2024-01-01 DIAGNOSIS — Z0000 Encounter for general adult medical examination without abnormal findings: Secondary | ICD-10-CM

## 2024-01-01 NOTE — Progress Notes (Signed)
 Subjective:   Stacie Porter is a 78 y.o. who presents for a Medicare Wellness preventive visit.  Visit Complete: Virtual I connected with  [name] on 01/01/24 by a audio enabled telemedicine application and verified that I am speaking with the correct person using two identifiers.  Patient Location: Home  Provider Location: Office/Clinic  I discussed the limitations of evaluation and management by telemedicine. The patient expressed understanding and agreed to proceed.  Vital Signs: Because this visit was a virtual/telehealth visit, some criteria may be missing or patient reported. Any vitals not documented were not able to be obtained and vitals that have been documented are patient reported.  VideoDeclined- This patient declined Librarian, academic. Therefore the visit was completed with audio only.  Persons Participating in Visit: Patient.  AWV Questionnaire: No: Patient Medicare AWV questionnaire was not completed prior to this visit.  Cardiac Risk Factors include: advanced age (>71men, >20 women);diabetes mellitus;obesity (BMI >30kg/m2);dyslipidemia;hypertension     Objective:    Today's Vitals   01/01/24 0855  Weight: 214 lb (97.1 kg)  Height: 5\' 4"  (1.626 m)   Body mass index is 36.73 kg/m.     01/01/2024    9:11 AM 02/20/2023    1:00 AM 11/27/2022    8:37 AM 11/22/2021    9:08 AM 02/11/2021    7:06 AM 11/16/2020    8:51 AM 10/04/2020    2:33 PM  Advanced Directives  Does Patient Have a Medical Advance Directive? Yes Yes Yes Yes Yes Yes Yes  Type of Estate agent of [place];Living will Living will  Healthcare Power of [place];Living will  Healthcare Power of [place];Living will Healthcare Power of [place];Living will  Does patient want to make changes to medical advance directive? No - Patient declined No - Patient declined  Yes (Inpatient - patient defers changing a medical advance directive and declines  information at this time)   No - Patient declined  Copy of Healthcare Power of Attorney in Chart? Yes - validated most recent copy scanned in chart (See row information)   Yes - validated most recent copy scanned in chart (See row information)  Yes - validated most recent copy scanned in chart (See row information) --    Current Medications (verified) Outpatient Encounter Medications as of 01/01/2024  Medication Sig   aspirin  EC (ASPIRIN  LOW DOSE) 81 MG tablet Take 1 tablet (81 mg total) by mouth daily. SWALLOW WHOLE.   Calcium  Carbonate-Vitamin D 600-200 MG-UNIT CAPS Take 1 tablet by mouth daily.   Continuous Glucose Sensor (FREESTYLE LIBRE 2 SENSOR) MISC 1 each by Does not apply route every 14 (fourteen) days.   dapagliflozin  propanediol (FARXIGA ) 10 MG TABS tablet Take 1 tablet (10 mg total) by mouth daily before breakfast.   lisinopril  (ZESTRIL ) 20 MG tablet Take 2 tablets (40 mg total) by mouth daily.   metoprolol  tartrate (LOPRESSOR ) 25 MG tablet Take 0.5 tablets (12.5 mg total) by mouth 2 (two) times daily.   Multiple Vitamin tablet Take 1 tablet by mouth daily.   nystatin  (MYCOSTATIN /NYSTOP ) powder Apply 1 Application topically 3 (three) times daily.   Omega-3 Fatty Acids (FISH OIL ) 1000 MG CAPS Take 1 capsule (1,000 mg total) by mouth daily.   omeprazole  (PRILOSEC) 20 MG capsule Take 1 capsule (20 mg total) by mouth daily.   Semaglutide  (RYBELSUS ) 7 MG TABS Take 1 tablet (7 mg total) by mouth daily.   simvastatin  (ZOCOR ) 40 MG tablet TAKE 1 TABLET BY MOUTH  EVERYDAY AT BEDTIME   zinc gluconate 50 MG tablet Take 50 mg by mouth daily.   No facility-administered encounter medications on file as of 01/01/2024.    Allergies (verified) Multaq [dronedarone]   History: Past Medical History:  Diagnosis Date   Cataract    Diabetes mellitus without complication (HCC)    type 2   Dysrhythmia    a fib   GERD (gastroesophageal reflux disease)    Hx of basal cell carcinoma ~2014 [facility]   Nose    Hyperlipidemia    Hypertension    Osteomyelitis of second toe of right foot (HCC) 11/11/2018   Sleep apnea    cpap   Past Surgical History:  Procedure Laterality Date   AMPUTATION TOE Right 11/11/2018   Procedure: AMPUTATION RIGHT 2ND TOE;  Surgeon: Neil Balls, MD;  Location: [facility];  Service: Orthopedics;  Laterality: Right;   APPENDECTOMY     CARDIAC ELECTROPHYSIOLOGY STUDY AND ABLATION     cather ablation  2011/2014   cardiac ablation for a fib   COLONOSCOPY WITH PROPOFOL  N/A 02/11/2021   Procedure: COLONOSCOPY WITH PROPOFOL ;  Surgeon: Irby Mannan, MD;  Location: [facility];  Service: Endoscopy;  Laterality: N/A;   EYE SURGERY Bilateral 05/2014   cataract   HAMMER TOE SURGERY Right    HAND SURGERY Bilateral    LAPAROSCOPIC OOPHERECTOMY     recession of gums sx     right shoulder surgery Right 12/24/2023   TUBAL LIGATION     Family History  Problem Relation Age of Onset   Heart disease Mother    Heart disease Father    Hypertension Father    Ovarian cancer Sister    Cancer Sister        ovarian   Lung cancer Sister    Cancer Sister        lung and thyroid    Thyroid  cancer Sister    Cancer Sister    Diabetes Maternal Uncle    Diabetes Maternal Uncle    Breast cancer Neg Hx    Social History   Socioeconomic History   Marital status: Married    Spouse name: Not on file   Number of children: 3   Years of education: Not on file   Highest education level: Some college, no degree  Occupational History   Occupation: retired    Comment: part time subsitute   Tobacco Use   Smoking status: Never   Smokeless tobacco: Never  Vaping Use   Vaping status: Never Used  Substance and Sexual Activity   Alcohol use: No   Drug use: No   Sexual activity: Yes    Birth control/protection: None  Other Topics Concern   Not on file  Social History Narrative   Married   Social Drivers of Health   Financial Resource Strain: Low Risk  (01/01/2024)   Overall  Financial Resource Strain (CARDIA)    Difficulty of Paying Living Expenses: Not hard at all  Food Insecurity: No Food Insecurity (01/01/2024)   Hunger Vital Sign    Worried About Running Out of Food in the Last Year: Never true    Ran Out of Food in the Last Year: Never true  Transportation Needs: No Transportation Needs (01/01/2024)   PRAPARE - Administrator, Civil Service (Medical): No    Lack of Transportation (Non-Medical): No  Physical Activity: Insufficiently Active (01/01/2024)   Exercise Vital Sign    Days of Exercise per Week: 2 days  Minutes of Exercise per Session: 20 min  Stress: No Stress Concern Present (01/01/2024)   [employer] of Occupational Health - Occupational Stress Questionnaire    Feeling of Stress : Only a little  Social Connections: Socially Integrated (01/01/2024)   Social Connection and Isolation Panel [NHANES]    Frequency of Communication with Friends and Family: More than three times a week    Frequency of Social Gatherings with Friends and Family: More than three times a week    Attends Religious Services: More than 4 times per year    Active Member of [employer] or Organizations: Yes    Attends Engineer, structural: More than 4 times per year    Marital Status: Married    Tobacco Counseling Counseling given: Not Answered    Clinical Intake:  Pre-visit preparation completed: Yes  Pain : No/denies pain     BMI - recorded: 36.73 Nutritional Status: BMI > 30  Obese Nutritional Risks: None Diabetes: Yes CBG done?: Yes (per patient FBS 91)  Lab Results  Component Value Date   HGBA1C 7.6 (A) 09/11/2023   HGBA1C 7.8 (H) 02/05/2023   HGBA1C 7.6 (A) 11/06/2022     How often do you need to have someone help you when you read instructions, pamphlets, or other written materials from your doctor or pharmacy?: 1 - Never  Interpreter Needed?: No  Information entered by :: R. Meena Barrantes LPN   Activities of Daily Living     01/01/2024     8:57 AM 02/20/2023    1:00 AM  In your present state of health, do you have any difficulty performing the following activities:  Hearing? 1 1  Comment wears aids   Vision? 0 0  Difficulty concentrating or making decisions? 0 0  Walking or climbing stairs? 0 0  Dressing or bathing? 0 0  Doing errands, shopping? 0 0  Preparing Food and eating ? N   Using the Toilet? N   In the past six months, have you accidently leaked urine? Y   Do you have problems with loss of bowel control? N   Managing your Medications? N   Managing your Finances? N   Housekeeping or managing your Housekeeping? N     Patient Care Team: Valli Gaw, MD as PCP - General (Family Medicine) Bary Boss., MD (Ophthalmology) Michelle Aid, MD as Consulting Physician (Cardiology) Elta Halter, MD (Dermatology) Anell Baptist, DPM as Referring Physician (Podiatry) Kelvin Pattee, RD as Dietitian (Dietician)  Indicate any recent Medical Services you may have received from other than [facility] providers in the past year (date may be approximate).     Assessment:   This is a routine wellness examination for [place].  Hearing/Vision screen Hearing Screening - Comments:: Wears aids Vision Screening - Comments:: No glasses    Goals Addressed             This Visit's Progress    Patient Stated       Wants to exercise more and lose weight       Depression Screen     01/01/2024    9:05 AM 12/21/2023   11:05 AM 11/19/2023    4:14 PM 11/02/2023    1:03 PM 10/22/2023   11:38 AM 10/10/2023    1:38 PM 10/04/2023   12:40 PM  PHQ 2/9 Scores  PHQ - 2 Score 0 0 0 0 0 0 0  PHQ- 9 Score 2 0 0 0 0 0 0  Fall Risk     01/01/2024    9:00 AM 12/21/2023   11:03 AM 11/19/2023    4:14 PM 11/02/2023    1:03 PM 10/10/2023    1:38 PM  Fall Risk   Falls in the past year? 1 1 1 1 1   Number falls in past yr: 0 0 0 0 0  Injury with Fall? 1 1 1 1 1   Comment right shoulder      Risk for fall due to : History of  fall(s);Impaired balance/gait History of fall(s) No Fall Risks No Fall Risks   Follow up Falls evaluation completed;Falls prevention discussed  Falls evaluation completed;Education provided Falls evaluation completed Falls evaluation completed;Education provided    MEDICARE RISK AT HOME:  Medicare Risk at Home Any stairs in or around the home?: No If so, are there any without handrails?: No Home free of loose throw rugs in walkways, pet beds, electrical cords, etc?: Yes Adequate lighting in your home to reduce risk of falls?: Yes Life alert?: No Use of a cane, walker or w/c?: No Grab bars in the bathroom?: Yes Shower chair or bench in shower?: Yes Elevated toilet seat or a handicapped toilet?: Yes  TIMED UP AND GO:  Was the test performed?  No  Cognitive Function: 6CIT completed        01/01/2024    9:11 AM 11/27/2022    8:21 AM  6CIT Screen  What Year? 0 points 0 points  What month? 0 points 0 points  What time? 0 points 0 points  Count back from 20 0 points 0 points  Months in reverse 0 points 0 points  Repeat phrase 0 points 0 points  Total Score 0 points 0 points    Immunizations Immunization History  Administered Date(s) Administered   Fluad Quad(high Dose 65+) 05/21/2020, 05/25/2021, 05/29/2022   Fluad Trivalent(High Dose 65+) 04/30/2018   Influenza Split 06/23/2006, 05/29/2009, 06/15/2010   Influenza, High Dose Seasonal PF 06/24/2014, 06/22/2017   Influenza,inj,Quad PF,6+ Mos 06/29/2015   Influenza-Unspecified 05/01/2018, 05/05/2019, 05/26/2023   Moderna Sars-Covid-2 Vaccination 10/09/2019, 11/06/2019   Pneumococcal Conjugate-13 09/23/2014   Pneumococcal Polysaccharide-23 07/01/2012   Td 08/28/1997, 03/20/2008   Td (Adult), 2 Lf Tetanus Toxid, Preservative Free 08/28/1997, 03/20/2008   Tdap 09/23/2014   Zoster Recombinant(Shingrix) 11/21/2021, 02/06/2022    Screening Tests Health Maintenance  Topic Date Due   COVID-19 Vaccine (3 - Moderna risk series)  12/04/2019   OPHTHALMOLOGY EXAM  10/11/2023   Medicare Annual Wellness (AWV)  11/27/2023   Diabetic kidney evaluation - Urine ACR  02/05/2024   HEMOGLOBIN A1C  03/10/2024   INFLUENZA VACCINE  03/28/2024   FOOT EXAM  08/01/2024   DTaP/Tdap/Td (5 - Td or Tdap) 09/23/2024   Diabetic kidney evaluation - eGFR measurement  10/09/2024   Colonoscopy  02/12/2028   DEXA SCAN  04/16/2033   Pneumonia Vaccine 58+ Years old  Completed   Hepatitis C Screening  Completed   Zoster Vaccines- Shingrix  Completed   HPV VACCINES  Aged Out   Meningococcal B Vaccine  Aged Out    Health Maintenance  Health Maintenance Due  Topic Date Due   COVID-19 Vaccine (3 - Moderna risk series) 12/04/2019   OPHTHALMOLOGY EXAM  10/11/2023   Medicare Annual Wellness (AWV)  11/27/2023   Health Maintenance Items Addressed: Patient declines covid vaccine.  Additional Screening:  Vision Screening: Recommended annual ophthalmology exams for early detection of glaucoma and other disorders of the eye. Up to date Dr. Parke Boll  Dental Screening: Recommended annual dental exams for proper oral hygiene  Community Resource Referral / Chronic Care Management: CRR required this visit?  No   CCM required this visit?  No     Plan:     I have personally reviewed and noted the following in the patient's chart:   Medical and social history Use of alcohol, tobacco or illicit drugs  Current medications and supplements including opioid prescriptions. Patient is not currently taking opioid prescriptions. Functional ability and status Nutritional status Physical activity Advanced directives List of other physicians Hospitalizations, surgeries, and ER visits in previous 12 months Vitals Screenings to include cognitive, depression, and falls Referrals and appointments  In addition, I have reviewed and discussed with patient certain preventive protocols, quality metrics, and best practice recommendations. A written  personalized care plan for preventive services as well as general preventive health recommendations were provided to patient.     Felicitas Horse, LPN   02/03/6294   After Visit Summary: (MyChart) Due to this being a telephonic visit, the after visit summary with patients personalized plan was offered to patient via MyChart   Notes: Nothing significant to report at this time.

## 2024-01-01 NOTE — Patient Instructions (Addendum)
 Stacie Porter , Thank you for taking time to come for your Medicare Wellness Visit. I appreciate your ongoing commitment to your health goals. Please review the following plan we discussed and let me know if I can assist you in the future.   Referrals/Orders/Follow-Ups/Clinician Recommendations: Your diabetic eye exam has been updated. Consider updating your covid vaccine.  This is a list of the screening recommended for you and due dates:  Health Maintenance  Topic Date Due   COVID-19 Vaccine (3 - Moderna risk series) 12/04/2019   Yearly kidney health urinalysis for diabetes  02/05/2024   Hemoglobin A1C  03/10/2024   Flu Shot  03/28/2024   Complete foot exam   08/01/2024   DTaP/Tdap/Td vaccine (5 - Td or Tdap) 09/23/2024   Mammogram  10/07/2024   Yearly kidney function blood test for diabetes  10/09/2024   Eye exam for diabetics  11/25/2024   Medicare Annual Wellness Visit  12/31/2024   Colon Cancer Screening  02/12/2028   DEXA scan (bone density measurement)  04/16/2033   Pneumonia Vaccine  Completed   Hepatitis C Screening  Completed   Zoster (Shingles) Vaccine  Completed   HPV Vaccine  Aged Out   Meningitis B Vaccine  Aged Out    Advanced directives: (In Chart) A copy of your advanced directives are scanned into your chart should your provider ever need it.  Next Medicare Annual Wellness Visit scheduled for next year: Yes 01/05/25 @ 10:10   Have you seen your provider in the last 6 months (3 months if uncontrolled diabetes)? Yes 12/21/23

## 2024-01-02 ENCOUNTER — Telehealth: Payer: Self-pay

## 2024-01-02 ENCOUNTER — Encounter: Payer: Self-pay | Admitting: Family Medicine

## 2024-01-02 NOTE — Telephone Encounter (Signed)
 I scheduled her with Charanpreet Kaur for 01/03/24 at 11:00.

## 2024-01-02 NOTE — Telephone Encounter (Signed)
 Called Patient and tried to schedule her with Dr. Narendra but she declined an appointment due to him being a female. I will see if I can get her in with someone else. Patient states she has tried Monistat and it is not doing anything.

## 2024-01-03 ENCOUNTER — Encounter: Payer: Self-pay | Admitting: Nurse Practitioner

## 2024-01-03 ENCOUNTER — Other Ambulatory Visit (HOSPITAL_COMMUNITY)
Admission: RE | Admit: 2024-01-03 | Discharge: 2024-01-03 | Disposition: A | Discharge status: Home / Self Care | Source: Ambulatory Visit | Attending: Nurse Practitioner | Admitting: Nurse Practitioner

## 2024-01-03 ENCOUNTER — Ambulatory Visit: Admitting: Nurse Practitioner

## 2024-01-03 VITALS — BP 118/66 | HR 74 | Temp 97.8°F | Ht 64.0 in | Wt 210.6 lb

## 2024-01-03 DIAGNOSIS — N760 Acute vaginitis: Secondary | ICD-10-CM

## 2024-01-03 MED ORDER — FLUCONAZOLE 150 MG PO TABS: 150.0000 mg | ORAL_TABLET | Freq: Every day | ORAL | 1 refills | Status: DC

## 2024-01-03 NOTE — Progress Notes (Signed)
 Established Patient Office Visit  Subjective:  Patient ID: Stacie Porter, female    DOB: 11-Apr-1946  Age: 78 y.o. MRN: [id number]  CC:  Chief Complaint  Patient presents with   Acute Visit    Yeast infection    HPI  Stacie Porter presents for Vaginal itching.  Vaginal Itching The patient's primary symptoms include genital itching. This is a recurrent problem. The current episode started yesterday. The problem occurs 2 to 4 times per day. The problem has been unchanged. She is not pregnant. Pertinent negatives include no constipation, diarrhea, discolored urine, dysuria, flank pain, hematuria or urgency. Nothing aggravates the symptoms. Treatments tried: OTC antifungal cream. The treatment provided no relief.     Past Medical History:  Diagnosis Date   Cataract    Diabetes mellitus without complication (HCC)    type 2   Dysrhythmia    a fib   GERD (gastroesophageal reflux disease)    Hx of basal cell carcinoma ~2014 [facility]   Nose   Hyperlipidemia    Hypertension    Osteomyelitis of second toe of right foot (HCC) 11/11/2018   Sleep apnea    cpap    Past Surgical History:  Procedure Laterality Date   AMPUTATION TOE Right 11/11/2018   Procedure: AMPUTATION RIGHT 2ND TOE;  Surgeon: Neil Balls, MD;  Location: [facility];  Service: Orthopedics;  Laterality: Right;   APPENDECTOMY     CARDIAC ELECTROPHYSIOLOGY STUDY AND ABLATION     cather ablation  2011/2014   cardiac ablation for a fib   COLONOSCOPY WITH PROPOFOL  N/A 02/11/2021   Procedure: COLONOSCOPY WITH PROPOFOL ;  Surgeon: Irby Mannan, MD;  Location: [facility];  Service: Endoscopy;  Laterality: N/A;   EYE SURGERY Bilateral 05/2014   cataract   HAMMER TOE SURGERY Right    HAND SURGERY Bilateral    LAPAROSCOPIC OOPHERECTOMY     recession of gums sx     right shoulder surgery Right 12/24/2023   TUBAL LIGATION      Family History  Problem Relation Age of Onset   Heart disease Mother    Heart disease  Father    Hypertension Father    Ovarian cancer Sister    Cancer Sister        ovarian   Lung cancer Sister    Cancer Sister        lung and thyroid    Thyroid  cancer Sister    Cancer Sister    Diabetes Maternal Uncle    Diabetes Maternal Uncle    Breast cancer Neg Hx     Social History   Socioeconomic History   Marital status: Married    Spouse name: Not on file   Number of children: 3   Years of education: Not on file   Highest education level: Some college, no degree  Occupational History   Occupation: retired    Comment: part time subsitute   Tobacco Use   Smoking status: Never   Smokeless tobacco: Never  Vaping Use   Vaping status: Never Used  Substance and Sexual Activity   Alcohol use: No   Drug use: No   Sexual activity: Yes    Birth control/protection: None  Other Topics Concern   Not on file  Social History Narrative   Married   Social Drivers of Health   Financial Resource Strain: Low Risk  (01/01/2024)   Overall Financial Resource Strain (CARDIA)    Difficulty of Paying Living Expenses: Not hard at all  Food Insecurity: No Food Insecurity (01/01/2024)   Hunger Vital Sign    Worried About Running Out of Food in the Last Year: Never true    Ran Out of Food in the Last Year: Never true  Transportation Needs: No Transportation Needs (01/01/2024)   PRAPARE - Administrator, Civil Service (Medical): No    Lack of Transportation (Non-Medical): No  Physical Activity: Insufficiently Active (01/01/2024)   Exercise Vital Sign    Days of Exercise per Week: 2 days    Minutes of Exercise per Session: 20 min  Stress: No Stress Concern Present (01/01/2024)   [employer] of Occupational Health - Occupational Stress Questionnaire    Feeling of Stress : Only a little  Social Connections: Socially Integrated (01/01/2024)   Social Connection and Isolation Panel [NHANES]    Frequency of Communication with Friends and Family: More than three times a week     Frequency of Social Gatherings with Friends and Family: More than three times a week    Attends Religious Services: More than 4 times per year    Active Member of [employer] or Organizations: Yes    Attends Engineer, structural: More than 4 times per year    Marital Status: Married  Catering manager Violence: Not At Risk (01/01/2024)   Humiliation, Afraid, Rape, and Kick questionnaire    Fear of Current or Ex-Partner: No    Emotionally Abused: No    Physically Abused: No    Sexually Abused: No     Outpatient Medications Prior to Visit  Medication Sig Dispense Refill   aspirin  EC (ASPIRIN  LOW DOSE) 81 MG tablet Take 1 tablet (81 mg total) by mouth daily. SWALLOW WHOLE.     Calcium  Carbonate-Vitamin D 600-200 MG-UNIT CAPS Take 1 tablet by mouth daily.     Continuous Glucose Sensor (FREESTYLE LIBRE 2 SENSOR) MISC 1 each by Does not apply route every 14 (fourteen) days.     dapagliflozin  propanediol (FARXIGA ) 10 MG TABS tablet Take 1 tablet (10 mg total) by mouth daily before breakfast. 90 tablet 3   lisinopril  (ZESTRIL ) 20 MG tablet Take 2 tablets (40 mg total) by mouth daily.     metoprolol  tartrate (LOPRESSOR ) 25 MG tablet Take 0.5 tablets (12.5 mg total) by mouth 2 (two) times daily. 90 tablet 3   Multiple Vitamin tablet Take 1 tablet by mouth daily.     Omega-3 Fatty Acids (FISH OIL ) 1000 MG CAPS Take 1 capsule (1,000 mg total) by mouth daily.     omeprazole  (PRILOSEC) 20 MG capsule Take 1 capsule (20 mg total) by mouth daily. 90 capsule 3   Semaglutide  (RYBELSUS ) 7 MG TABS Take 1 tablet (7 mg total) by mouth daily. 30 tablet 3   simvastatin  (ZOCOR ) 40 MG tablet TAKE 1 TABLET BY MOUTH EVERYDAY AT BEDTIME 90 tablet 3   zinc gluconate 50 MG tablet Take 50 mg by mouth daily.     nystatin  (MYCOSTATIN /NYSTOP ) powder Apply 1 Application topically 3 (three) times daily. 30 g 6   No facility-administered medications prior to visit.    Allergies  Allergen Reactions   Multaq [Dronedarone]      Unknown reaction     ROS Review of Systems  Gastrointestinal:  Negative for constipation and diarrhea.  Genitourinary:  Negative for dysuria, flank pain, hematuria and urgency.   Negative unless indicated in HPI.    Objective:     Physical Exam Constitutional:      Appearance: Normal appearance.  Cardiovascular:     Rate and Rhythm: Normal rate and regular rhythm.     Pulses: Normal pulses.     Heart sounds: Normal heart sounds.  Abdominal:     General: Bowel sounds are normal.     Palpations: Abdomen is soft.     Tenderness: There is no abdominal tenderness. There is no right CVA tenderness or left CVA tenderness.  Musculoskeletal:     Cervical back: Normal range of motion.  Neurological:     General: No focal deficit present.     Mental Status: She is alert. Mental status is at baseline.  Psychiatric:        Mood and Affect: Mood normal.        Behavior: Behavior normal.        Thought Content: Thought content normal.        Judgment: Judgment normal.     BP 118/66   Pulse 74   Temp 97.8 F (36.6 C)   Ht 5\' 4"  (1.626 m)   Wt 210 lb 9.6 oz (95.5 kg)   SpO2 96%   BMI 36.15 kg/m  Wt Readings from Last 3 Encounters:  01/03/24 210 lb 9.6 oz (95.5 kg)  01/01/24 214 lb (97.1 kg)  11/19/23 210 lb (95.3 kg)     Health Maintenance  Topic Date Due   Diabetic kidney evaluation - Urine ACR  02/05/2024   COVID-19 Vaccine (3 - Moderna risk series) 01/19/2024 (Originally 12/04/2019)   HEMOGLOBIN A1C  03/10/2024   INFLUENZA VACCINE  03/28/2024   FOOT EXAM  08/01/2024   DTaP/Tdap/Td (5 - Td or Tdap) 09/23/2024   MAMMOGRAM  10/07/2024   Diabetic kidney evaluation - eGFR measurement  10/09/2024   OPHTHALMOLOGY EXAM  11/25/2024   Medicare Annual Wellness (AWV)  12/31/2024   Colonoscopy  02/12/2028   DEXA SCAN  04/16/2033   Pneumonia Vaccine 15+ Years old  Completed   Hepatitis C Screening  Completed   Zoster Vaccines- Shingrix  Completed   HPV VACCINES  Aged Out    Meningococcal B Vaccine  Aged Out    There are no preventive care reminders to display for this patient.  Lab Results  Component Value Date   TSH 0.82 10/10/2023   Lab Results  Component Value Date   WBC 9.1 10/10/2023   HGB 13.1 10/10/2023   HCT 38.2 10/10/2023   MCV 97.7 10/10/2023   PLT 124.0 (L) 10/10/2023   Lab Results  Component Value Date   NA 142 10/10/2023   K 4.5 10/10/2023   CO2 28 10/10/2023   GLUCOSE 167 (H) 10/10/2023   BUN 19 10/10/2023   CREATININE 0.89 10/10/2023   BILITOT 0.7 10/10/2023   ALKPHOS 49 10/10/2023   AST 14 10/10/2023   ALT 12 10/10/2023   PROT 6.4 10/10/2023   ALBUMIN 4.2 10/10/2023   CALCIUM  9.5 10/10/2023   ANIONGAP 6 02/20/2023   EGFR 57 (L) 04/16/2023   GFR 62.71 10/10/2023   Lab Results  Component Value Date   CHOL 151 02/05/2023   Lab Results  Component Value Date   HDL 39 (L) 02/05/2023   Lab Results  Component Value Date   LDLCALC 82 02/05/2023   Lab Results  Component Value Date   TRIG 174 (H) 02/05/2023   Lab Results  Component Value Date   CHOLHDL 3.9 02/05/2023   Lab Results  Component Value Date   HGBA1C 7.6 (A) 09/11/2023      Assessment & Plan:  Acute vaginitis  Assessment & Plan: Recurrent vaginal itching had tried monistat without improvement. Will treat with diflucan .  -Vaginal swab positive for BV and Candida.  - Metronidazole  sent to the pharmacy to treat. - Last GFR 62.7 on 10/10/2023  Orders: -     Cervicovaginal ancillary only  Other orders -     Fluconazole ; Take 1 tablet (150 mg total) by mouth daily.  Dispense: 1 tablet; Refill: 1    Follow-up: No follow-ups on file.   Talissa Apple, NP

## 2024-01-04 LAB — CERVICOVAGINAL ANCILLARY ONLY
Bacterial Vaginitis (gardnerella): POSITIVE — AB
Candida Glabrata: NEGATIVE
Candida Vaginitis: POSITIVE — AB
Chlamydia: NEGATIVE
Comment: NEGATIVE
Comment: NEGATIVE
Comment: NEGATIVE
Comment: NEGATIVE
Comment: NEGATIVE
Comment: NORMAL
Neisseria Gonorrhea: NEGATIVE
Trichomonas: NEGATIVE

## 2024-01-06 ENCOUNTER — Other Ambulatory Visit: Payer: Self-pay | Admitting: Nurse Practitioner

## 2024-01-06 ENCOUNTER — Encounter: Payer: Self-pay | Admitting: Nurse Practitioner

## 2024-01-06 MED ORDER — METRONIDAZOLE 500 MG PO TABS: 500.0000 mg | ORAL_TABLET | Freq: Two times a day (BID) | ORAL | 0 refills | Status: AC

## 2024-01-06 NOTE — Progress Notes (Signed)
 Please inform patient that the swab was positive for yeast and bacterial vaginosis.  I The prescription for treatment of BV,  metronidazole has been sent to the pharmacy.  Diflucan  for yeast infection was sent during the visit

## 2024-01-19 DIAGNOSIS — N760 Acute vaginitis: Secondary | ICD-10-CM | POA: Insufficient documentation

## 2024-01-19 NOTE — Assessment & Plan Note (Addendum)
 Recurrent vaginal itching had tried monistat without improvement. Will treat with diflucan .  -Vaginal swab positive for BV and Candida.  - Metronidazole  sent to the pharmacy to treat. - Last GFR 62.7 on 10/10/2023

## 2024-02-03 NOTE — Patient Instructions (Incomplete)
 It was a pleasure meeting you today. Thank you for allowing me to take part in your health care.  Our goals for today as we discussed include:  A1c  Refills sent for requested medications   This is a list of the screening recommended for you and due dates:  Health Maintenance  Topic Date Due   COVID-19 Vaccine (3 - Moderna risk series) 12/04/2019   Yearly kidney health urinalysis for diabetes  02/05/2024   Hemoglobin A1C  03/10/2024   Flu Shot  03/28/2024   Complete foot exam   08/01/2024   DTaP/Tdap/Td vaccine (5 - Td or Tdap) 09/23/2024   Mammogram  10/07/2024   Yearly kidney function blood test for diabetes  10/09/2024   Eye exam for diabetics  11/25/2024   Medicare Annual Wellness Visit  12/31/2024   Colon Cancer Screening  02/12/2028   DEXA scan (bone density measurement)  04/16/2033   Pneumonia Vaccine  Completed   Hepatitis C Screening  Completed   Zoster (Shingles) Vaccine  Completed   HPV Vaccine  Aged Out   Meningitis B Vaccine  Aged Out      If you have any questions or concerns, please do not hesitate to call the office at [phone].  I look forward to our next visit and until then take care and stay safe.  Regards,   Valli Gaw, MD   [facility]

## 2024-02-04 ENCOUNTER — Ambulatory Visit (INDEPENDENT_AMBULATORY_CARE_PROVIDER_SITE_OTHER): Admitting: Family Medicine

## 2024-02-04 ENCOUNTER — Encounter

## 2024-02-04 ENCOUNTER — Ambulatory Visit: Payer: Self-pay | Admitting: Family Medicine

## 2024-02-04 ENCOUNTER — Encounter: Payer: Self-pay | Admitting: Family Medicine

## 2024-02-04 VITALS — BP 136/60 | HR 67 | Temp 98.0°F | Resp 20 | Ht 64.0 in | Wt 219.1 lb

## 2024-02-04 DIAGNOSIS — Z9889 Other specified postprocedural states: Secondary | ICD-10-CM | POA: Insufficient documentation

## 2024-02-04 DIAGNOSIS — E118 Type 2 diabetes mellitus with unspecified complications: Secondary | ICD-10-CM

## 2024-02-04 DIAGNOSIS — K2100 Gastro-esophageal reflux disease with esophagitis, without bleeding: Secondary | ICD-10-CM | POA: Diagnosis not present

## 2024-02-04 DIAGNOSIS — E1159 Type 2 diabetes mellitus with other circulatory complications: Secondary | ICD-10-CM

## 2024-02-04 DIAGNOSIS — E1169 Type 2 diabetes mellitus with other specified complication: Secondary | ICD-10-CM

## 2024-02-04 DIAGNOSIS — E785 Hyperlipidemia, unspecified: Secondary | ICD-10-CM

## 2024-02-04 DIAGNOSIS — I152 Hypertension secondary to endocrine disorders: Secondary | ICD-10-CM

## 2024-02-04 DIAGNOSIS — I480 Paroxysmal atrial fibrillation: Secondary | ICD-10-CM

## 2024-02-04 LAB — POCT GLYCOSYLATED HEMOGLOBIN (HGB A1C): Hemoglobin A1C: 7.2 % — AB (ref 4.0–5.6)

## 2024-02-04 MED ORDER — OMEPRAZOLE 20 MG PO CPDR: 20.0000 mg | DELAYED_RELEASE_CAPSULE | Freq: Every day | ORAL | 3 refills | Status: AC

## 2024-02-04 MED ORDER — METOPROLOL TARTRATE 25 MG PO TABS: 12.5000 mg | ORAL_TABLET | Freq: Two times a day (BID) | ORAL | 3 refills | Status: AC

## 2024-02-04 MED ORDER — LISINOPRIL 20 MG PO TABS: 40.0000 mg | ORAL_TABLET | Freq: Every day | ORAL | 3 refills | Status: DC

## 2024-02-04 MED ORDER — SIMVASTATIN 40 MG PO TABS: 40.0000 mg | ORAL_TABLET | Freq: Every day | ORAL | 3 refills | Status: AC

## 2024-02-04 MED ORDER — RYBELSUS 7 MG PO TABS: 7.0000 mg | ORAL_TABLET | Freq: Every day | ORAL | 3 refills | Status: AC

## 2024-02-04 MED ORDER — LISINOPRIL 40 MG PO TABS: 40.0000 mg | ORAL_TABLET | Freq: Every day | ORAL | 3 refills | Status: AC

## 2024-02-04 MED ORDER — DAPAGLIFLOZIN PROPANEDIOL 10 MG PO TABS: 10.0000 mg | ORAL_TABLET | Freq: Every day | ORAL | 3 refills | Status: AC

## 2024-02-04 NOTE — Assessment & Plan Note (Signed)
 On omeprazole  20 mg daily. Discussed side effect of diarrhea in 4% of cases and could be contributing to her symptoms.  Would recommend to consider weaning off slow taper to avoid rebound in future. - Refill omeprazole  20 mg daily.

## 2024-02-04 NOTE — Assessment & Plan Note (Signed)
 Arthroscopic repair of right rotator cuff 11/2023 at [facility] Doing well For Physical therapy today Follow up with Orthopedics for continued management

## 2024-02-04 NOTE — Assessment & Plan Note (Signed)
 Rate controlled on BB Refill Metoprolol  XL 12.5 mg BID Not currently on anticoagulation Takes ASA low dose Follows with Cardiology

## 2024-02-04 NOTE — Assessment & Plan Note (Signed)
 Well controlled on current antihypertensives - Refill Lisinopril  40 mg daily - Refill Metoprolol  XL 12.5 mg BID

## 2024-02-04 NOTE — Assessment & Plan Note (Signed)
 HbA1c at 7.2, slight improvement but not at target. Hesitant to increase Rybelsus  due to diarrhea concerns. Prefers to avoid dose increase due to past adverse reactions.  - Refill Rybelsus  7 mg daily. Patient prefers not to increase dose - UACR today - Follow-up in six months to reassess.

## 2024-02-04 NOTE — Assessment & Plan Note (Signed)
 Tolerating statin well - Refill Zocor  40 mg daily

## 2024-02-04 NOTE — Progress Notes (Signed)
 SUBJECTIVE:   Chief Complaint  Patient presents with   Diabetes   HPI Presents for follow up chronic disease management  Discussed the use of AI scribe software for clinical note transcription with the patient, who gave verbal consent to proceed.  History of Present Illness Stacie Porter is a 78 year old female with diabetes who presents for a follow-up visit regarding her diabetes management.  Her recent HbA1c is 7.2, showing a slight decrease. She is currently taking Rybelsus  7 mg and is hesitant to increase to 14 mg due to concerns about worsening bowel urgency, especially after her recent rotator cuff surgery. She experiences an urgent need to have a bowel movement, which is concerning given her limited mobility post-surgery. She prefers to remain on the current dose of Rybelsus  due to previous adverse experiences with injectable medications, which led to hospitalization.  She recently underwent rotator cuff surgery and has not been physically active for six weeks. She has just started walking again and is beginning physical therapy. She is unable to use her right arm extensively and is concerned about managing her bathroom needs due to her limited mobility.  Her current medications include Farxiga  10 mg, lisinopril  40 mg once daily, Lopressor  half a tablet twice a day, omeprazole  20 mg daily, and simvastatin  40 mg. She recently started using a new bottle of simvastatin . She has been taking Prilosec daily.  She has been experiencing puffiness after returning to sleeping in her bed, which she did not experience while sleeping in a recliner for six weeks post-surgery. She notes that she slept better in the recliner and is considering options to improve her sleep quality in bed.  She uses a continuous glucose monitor on her arm, which she feels reads higher than her finger stick tests by about 20 points. She continues to use both methods to monitor her blood sugar levels.  No chest pain,  shortness of breath, or palpitations. She urinated three times last night and notes better sleep quality in a recliner. She is eating and drinking well and is able to urinate, although she is concerned about her ability to provide a urine sample due to her limited hand function.     PERTINENT PMH / PSH: As above  OBJECTIVE:  BP 136/60   Pulse 67   Temp 98 F (36.7 C)   Resp 20   Ht 5\' 4"  (1.626 m)   Wt 219 lb 2 oz (99.4 kg)   SpO2 99%   BMI 37.61 kg/m    Physical Exam Vitals reviewed.  Constitutional:      General: She is not in acute distress.    Appearance: Normal appearance. She is obese. She is not ill-appearing, toxic-appearing or diaphoretic.  Eyes:     General:        Right eye: No discharge.        Left eye: No discharge.     Conjunctiva/sclera: Conjunctivae normal.  Cardiovascular:     Rate and Rhythm: Normal rate and regular rhythm.     Heart sounds: Normal heart sounds.  Pulmonary:     Effort: Pulmonary effort is normal.     Breath sounds: Normal breath sounds.  Abdominal:     General: Bowel sounds are normal.  Skin:    General: Skin is warm and dry.  Neurological:     General: No focal deficit present.     Mental Status: She is alert and oriented to person, place, and time. Mental status is  at baseline.  Psychiatric:        Mood and Affect: Mood normal.        Behavior: Behavior normal.        Thought Content: Thought content normal.        Judgment: Judgment normal.           02/04/2024    9:00 AM 01/03/2024   11:18 AM 01/01/2024    9:05 AM 12/21/2023   11:05 AM 11/19/2023    4:14 PM  Depression screen PHQ 2/9  Decreased Interest 0 0 0 0 0  Down, Depressed, Hopeless 0 0 0 0 0  PHQ - 2 Score 0 0 0 0 0  Altered sleeping 0 0 1 0 0  Tired, decreased energy 0 0 1 0 0  Change in appetite 0 0 0 0 0  Feeling bad or failure about yourself  0 0 0 0 0  Trouble concentrating 0 0 0 0 0  Moving slowly or fidgety/restless 0 0 0 0 0  Suicidal thoughts 0 0 0 0  0  PHQ-9 Score 0 0 2 0 0  Difficult doing work/chores Not difficult at all Not difficult at all Not difficult at all Not difficult at all Not difficult at all      02/04/2024    9:00 AM 01/03/2024   11:18 AM 12/21/2023   11:05 AM 11/19/2023    4:15 PM  GAD 7 : Generalized Anxiety Score  Nervous, Anxious, on Edge 0 0 0 0  Control/stop worrying 0 0 0 0  Worry too much - different things 0 0 0 0  Trouble relaxing 0 0 0 0  Restless 0 0 0 0  Easily annoyed or irritable 0 0 0 0  Afraid - awful might happen 0 0 0 0  Total GAD 7 Score 0 0 0 0  Anxiety Difficulty Not difficult at all Not difficult at all Not difficult at all Not difficult at all    ASSESSMENT/PLAN:  Hyperlipidemia associated with type 2 diabetes mellitus (HCC) Assessment & Plan: Tolerating statin well - Refill Zocor  40 mg daily   Orders: -     Simvastatin ; Take 1 tablet (40 mg total) by mouth daily at 6 PM.  Dispense: 90 tablet; Refill: 3  Gastroesophageal reflux disease with esophagitis without hemorrhage Assessment & Plan: On omeprazole  20 mg daily. Discussed side effect of diarrhea in 4% of cases and could be contributing to her symptoms.  Would recommend to consider weaning off slow taper to avoid rebound in future. - Refill omeprazole  20 mg daily.  Orders: -     Omeprazole ; Take 1 capsule (20 mg total) by mouth daily.  Dispense: 90 capsule; Refill: 3  Type 2 diabetes mellitus with complications (HCC) Assessment & Plan: HbA1c at 7.2, slight improvement but not at target. Hesitant to increase Rybelsus  due to diarrhea concerns. Prefers to avoid dose increase due to past adverse reactions.  - Refill Rybelsus  7 mg daily. Patient prefers not to increase dose - UACR today - Follow-up in six months to reassess.   Orders: -     Microalbumin / creatinine urine ratio -     POCT glycosylated hemoglobin (Hb A1C) -     Dapagliflozin  Propanediol; Take 1 tablet (10 mg total) by mouth daily before breakfast.  Dispense: 90  tablet; Refill: 3 -     Rybelsus ; Take 1 tablet (7 mg total) by mouth daily.  Dispense: 30 tablet; Refill: 3 -     AMB  Referral [facility] Care Management  Hypertension associated with diabetes [facility]) Assessment & Plan: Well controlled on current antihypertensives - Refill Lisinopril  40 mg daily - Refill Metoprolol  XL 12.5 mg BID   Orders: -     Metoprolol  Tartrate; Take 0.5 tablets (12.5 mg total) by mouth 2 (two) times daily.  Dispense: 90 tablet; Refill: 3 -     Lisinopril ; Take 1 tablet (40 mg total) by mouth daily.  Dispense: 90 tablet; Refill: 3  Paroxysmal atrial fibrillation (HCC) Assessment & Plan: Rate controlled on BB Refill Metoprolol  XL 12.5 mg BID Not currently on anticoagulation Takes ASA low dose Follows with Cardiology   S/P shoulder surgery Assessment & Plan: Arthroscopic repair of right rotator cuff 11/2023 at [facility] Doing well For Physical therapy today Follow up with Orthopedics for continued management      PDMP reviewed  Return in about 6 months (around 08/05/2024) for PCP, DM.  Valli Gaw, MD

## 2024-02-05 LAB — MICROALBUMIN / CREATININE URINE RATIO
Creatinine, Urine: 27.7 mg/dL
Microalb/Creat Ratio: <11 mg/g{creat} (ref 0–29)
Microalbumin, Urine: <3 ug/mL

## 2024-02-08 ENCOUNTER — Encounter: Payer: Medicare Other | Admitting: Family Medicine

## 2024-02-19 ENCOUNTER — Telehealth: Payer: Self-pay

## 2024-02-19 NOTE — Telephone Encounter (Signed)
 Called pt and ask if she wanted a referral to another GI, and she stated that she does not have the problem she was having in Feb. She does not think that she needs the referral at this time.

## 2024-02-21 ENCOUNTER — Telehealth: Payer: Self-pay

## 2024-02-21 NOTE — Progress Notes (Signed)
 Complex Care Management Note  Care Guide Note 02/21/2024 Name: KYMORA SCIARA MRN: [id number] DOB: [birth year]  JASEY CORTEZ is a 78 y.o. year old female who sees Hope Merle, MD for primary care. I reached out to Therisa LELON Keepers by phone today to offer complex care management services.  Ms. Farner was given information about Complex Care Management services today including:   The Complex Care Management services include support from the care team which includes your Nurse Care Manager, Clinical Social Worker, or Pharmacist.  The Complex Care Management team is here to help remove barriers to the health concerns and goals most important to you. Complex Care Management services are voluntary, and the patient may decline or stop services at any time by request to their care team member.   Complex Care Management Consent Status: Patient agreed to services and verbal consent obtained.   Follow up plan:  Telephone appointment with complex care management team member scheduled for:  03/03/24 at 10:00 a.m.   Encounter Outcome:  Patient Scheduled  D[facility]  [facility], [facility] Care Management Assistant Direct Dial: [phone]  Fax: [phone]

## 2024-03-03 ENCOUNTER — Other Ambulatory Visit (INDEPENDENT_AMBULATORY_CARE_PROVIDER_SITE_OTHER): Admitting: Pharmacist

## 2024-03-03 DIAGNOSIS — E118 Type 2 diabetes mellitus with unspecified complications: Secondary | ICD-10-CM

## 2024-03-03 NOTE — Progress Notes (Signed)
 03/03/2024 Name: Stacie Porter MRN: [id number] DOB: 1946/04/27  Subjective  Chief Complaint  Patient presents with   Diabetes    Reason for visit: ?  Stacie Porter is a 78 y.o. female with a history of diabetes (type 2), who presents today for an initial diabetes visit related to questions/concerns surrounding CGM system.? Pertinent PMH also includes HTN, PAF, HLD, OAB, urinary incontinence, obesity.  Known DM Complications: Hx diabetic foot ulcer   Care Team: Primary Care Provider: Hope ? TOC [facility] 10/14/24 Ref to pharmacy for questions w CGM   Date of Last Diabetes Related Visit: with PCP on 02/04/24   Medication Access/Adherence: Prescription drug coverage: Payor: BLUE CROSS BLUE SHIELD / Plan: BCBS/[employer] PPO / Product Type: *No Product type* / .  - Reports that all medications are affordable.  - Current Patient Assistance: None - Medication Adherence: Patient denies missing doses of their medication.    Since Last visit / History of Present Illness: ?  Patient reports doing well since last visit. No concerns with medications today.   Continues to use Libre 2 CGM as well as glucometer (reports her primary insurance requires her to comply with glucometer checks). States her secondary insurance covers her CGM.   Notes plateau in weight loss on Rybelsus . Noted weight reduction initially though none in recent weeks. Hesitant to increase Rybelsus  to 14 mg due to concerns about worsening bowel urgency. Recent rotator cuff surgery has limited physical activity and functionality of right arm which is a concern for her regarding bathroom needs.   Reported DM Regimen: ?  Farxiga  10 mg daily Rybelsus  7 mg daily    DM medications tried in the past:?  Metformin  XR (diarrhea) Mounjaro  (significant diarrhea, na/vom)  SMBG: Libre 2 (w reader) / glucometer Denies concerns generally speaking. Most recently, has been slightly confused as to why CGM readings are sometimes almost 30 points  different from glucometer check.   Otherwise denies concerns with CGM. Recently received replacement reader from [facility]). Receives sensors via mail order (reports this is covered by her secondary insurance plan).    Exercise: Decreased in the setting of shoulder surgery. Has since started to increase walking. Has been going to physical therapy.  DM Prevention:  Statin: Taking; moderate intensity.?  History of chronic kidney disease? no History of albuminuria? no, last UACR on 02/04/24 = <11 mg/g ACE/ARB - Taking lisinopril  40 mg; Urine MA/CR Ratio - normal.  Last eye exam: 11/26/23; No retinopathy present Last foot exam: 08/02/2023 Tobacco Use: Never smoker  Immunizations:? Flu: Up to date (Last: 05/26/2023); Pneumococcal: PPSV23 (2013 at age 14) PCV13 (2016 after age 18); Shingrix: Up to date (Last: 02/06/2022, 11/21/2021); Tdap: Up to date (last 2016)  Cardiovascular Risk Reduction History of clinical ASCVD? no The 10-year ASCVD risk score (Arnett DK, et al., 2019) is: 48% History of heart failure? no  History of hyperlipidemia? yes Current BMI: 37.6 kg/m2 (Ht 64 in, Wt 99.4 kg) Taking statin? yes (simvastatin  40mg ) Taking SGLT-2i? yes Taking GLP- 1 RA? yes     _______________________________________________  Objective    Review of Systems:? Limited in the setting of virtual visit  GI:? No nausea, vomiting, constipation, diarrhea, abdominal pain, dyspepsia, change in bowel habits  Endocrine:? No polyuria, polyphagia or blurred vision     Vitals:  Wt Readings from Last 3 Encounters:  02/04/24 219 lb 2 oz (99.4 kg)  01/03/24 210 lb 9.6 oz (95.5 kg)  01/01/24 214 lb (97.1 kg)  BP Readings from Last 3 Encounters:  02/04/24 136/60  01/03/24 118/66  12/21/23 126/62   Pulse Readings from Last 3 Encounters:  02/04/24 67  01/03/24 74  12/21/23 64   Labs:?  Lab Results  Component Value Date   HGBA1C 7.2 (A) 02/04/2024   HGBA1C 7.6 (A) 09/11/2023   HGBA1C 7.8 (H)  02/05/2023   GLUCOSE 167 (H) 10/10/2023   MICRALBCREAT <11 02/04/2024   MICRALBCREAT <5 02/05/2023   MICRALBCREAT <5 11/25/2021   CREATININE 0.89 10/10/2023   CREATININE 0.97 09/11/2023   CREATININE 1.05 05/18/2023   GFR 62.71 10/10/2023   GFR 56.59 (L) 09/11/2023   GFR 51.57 (L) 05/18/2023    Lab Results  Component Value Date   CHOL 151 02/05/2023   LDLCALC 82 02/05/2023   LDLCALC 90 11/25/2021   LDLCALC 96 11/11/2020   HDL 39 (L) 02/05/2023   TRIG 174 (H) 02/05/2023   TRIG 162 (H) 11/25/2021   TRIG 194 (H) 11/11/2020   ALT 12 10/10/2023   ALT 19 09/11/2023   AST 14 10/10/2023   AST 16 09/11/2023      Chemistry      Component Value Date/Time   NA 142 10/10/2023 1414   NA 139 04/16/2023 1500   NA 139 12/03/2014 0054   K 4.5 10/10/2023 1414   K 4.1 12/03/2014 0054   CL 103 10/10/2023 1414   CL 101 12/03/2014 0054   CO2 28 10/10/2023 1414   CO2 30 12/03/2014 0054   BUN 19 10/10/2023 1414   BUN 25 04/16/2023 1500   BUN 19 12/03/2014 0054   CREATININE 0.89 10/10/2023 1414   CREATININE 0.65 12/03/2014 0054   GLU 141 09/14/2014 0000      Component Value Date/Time   CALCIUM  9.5 10/10/2023 1414   CALCIUM  9.7 12/03/2014 0054   ALKPHOS 49 10/10/2023 1414   AST 14 10/10/2023 1414   ALT 12 10/10/2023 1414   BILITOT 0.7 10/10/2023 1414   BILITOT 0.4 04/16/2023 1500       The 10-year ASCVD risk score (Arnett DK, et al., 2019) is: 48%  Assessment and Plan:   1. Diabetes, type 2: uncontrolled per last A1c of 7.2% (6/9), decreased from previous 7.6% (09/11/23) with goal <7% without hypoglycemia. Denies concerns with current regimen. Tolerating well though preference to avoid titration for fear of adverse effects.  Reviewed CGM in detail today including differences between interstitial glucose and blood glucose levels before and after food is consumed.  Also discussed possibility for compression lows and variance within first ~24 hours of new sensor placement. It is  normal/expected for differences between CGM and glucometer readings. Most consistent should be when fasting.    2. HTN: uncontrolled based on last clinic BP of 136/60 mmHg (6/9) though historically has been below goal at several prior recent office visits. Goal <130/80 mmHg. Denies lightheadedness, dizziness, SOB, CP, vision changes.  Current Regimen: lisinopril  40 mg/d, metoprolol  tartrate 12.5 mg BID (PAF) Continue medications without changes.    3. ASCVD (primary prevention): Though likely some degree of PAD with hx diabetic food ulcer.  Uncontrolled on last lipid panel with LDL 82 mg/dL, TG 825 mg/dL (3/89/75). LDL goal <70 mg/dL (primary prevention, diabetes). Arguably, more aggressive goal of <55 reasonable in the setting of severely elevated ASCVD risk score.  The 10-year ASCVD risk score (Arnett DK, et al., 2019) is: 48% Key risk factors include: diabetes, hypertension, hyperlipidemia, and BMI >30 kg/m2 Continue medications today without changes.    4. Healthcare  Maintenance:  Pneumococcal - Current status: Up to date  Shingles - Current status: Series complete Influenza - Current status: up to date for 2024  Due to receive the following vaccines: N/A   Follow Up Patient given direct line for questions regarding medication therapy / CGM as needed.   Future Appointments  Date Time Provider Department Center  05/22/2024 10:30 AM Hester Alm BROCKS, MD [facility] None  08/04/2024  9:00 AM Onesimo Claude, MD [facility]  10/14/2024  9:00 AM Abbey Bruckner, MD [facility]  01/05/2025 10:10 AM [facility] ANNUAL WELLNESS VISIT [facility]    Manuelita FABIENE Kobs, PharmD Clinical Pharmacist [facility] [phone]

## 2024-03-03 NOTE — Patient Instructions (Addendum)
 [name],   It was a pleasure to speak with you today! As we discussed:?   Libre Continuous Glucose Monitor: It is normal/expected for differences between [place] and glucometer readings. Your readings should be closer together when you are fasting (8+ hours without food/drink, usually first thing in the morning). Why:  Continuous glucose monitoring (CGM) measures glucose in interstitial fluid, the fluid surrounding cells, while blood glucose monitoring with a glucometer device measures glucose directly in the blood. [name] is not directly checking for glucose in the blood, and there's a slight delay in glucose response to changes in blood glucose. Example: Finger stick readings may be higher than Libre numbers soon after (and while) eating.  Finger stick readings may start to come down after meals sooner than Libre readings   If you have any questions come up, you may respond directly to this message, or leave me a voicemail at [phone] and I will get back to you shortly.   Thank you!   Manuelita FABIENE Kobs, PharmD Clinical Pharmacist [facility] [phone]

## 2024-03-10 ENCOUNTER — Ambulatory Visit: Payer: Federal, State, Local not specified - PPO | Admitting: Family Medicine

## 2024-03-20 ENCOUNTER — Ambulatory Visit: Payer: Self-pay

## 2024-03-20 ENCOUNTER — Ambulatory Visit
Admission: RE | Admit: 2024-03-20 | Discharge: 2024-03-20 | Disposition: A | Discharge status: Home / Self Care | Source: Ambulatory Visit | Attending: Emergency Medicine | Admitting: Emergency Medicine

## 2024-03-20 VITALS — BP 146/77 | HR 73 | Temp 98.1°F | Resp 18

## 2024-03-20 DIAGNOSIS — N898 Other specified noninflammatory disorders of vagina: Secondary | ICD-10-CM | POA: Insufficient documentation

## 2024-03-20 MED ORDER — FLUCONAZOLE 150 MG PO TABS: 150.0000 mg | ORAL_TABLET | Freq: Every day | ORAL | 0 refills | Status: AC

## 2024-03-20 NOTE — ED Provider Notes (Signed)
 Stacie Porter    CSN: [id number] Arrival date & time: 03/20/24  1416      History   Chief Complaint Chief Complaint  Patient presents with   Vaginal Itching    Entered by patient    HPI KADEJA GRANADA is a 78 y.o. female.   Patient presents for evaluation of vaginal itching beginning 1 day ago.  Attempted counter Monistat which has been ineffective.  Endorses reoccurring yeast infections since use of Farxiga .  Denies vaginal discharge, odor abdominal flank pain or urinary symptoms.  Past Medical History:  Diagnosis Date   Cataract    Diabetes mellitus without complication (HCC)    type 2   Dysrhythmia    a fib   GERD (gastroesophageal reflux disease)    Hx of basal cell carcinoma ~2014 Mohs   Nose   Hyperlipidemia    Hypertension    Osteomyelitis of second toe of right foot (HCC) 11/11/2018   Sleep apnea    cpap    Patient Active Problem List   Diagnosis Date Noted   S/P shoulder surgery 02/04/2024   Acute vaginitis 01/19/2024   Intertrigo 10/15/2023   Candida infection 10/15/2023   Influenza A 10/09/2023   Breast cancer screening by mammogram 09/23/2023   Chronic bursitis of right shoulder 08/05/2023   Tenosynovitis 08/05/2023   Postnasal drip 08/05/2023   Diarrhea 05/20/2023   Hypomagnesemia 05/20/2023   OAB (overactive bladder) 05/19/2023   Hospital discharge follow-up 03/06/2023   AKI (acute kidney injury) (HCC) 03/06/2023   Gastroenteritis 02/19/2023   Post-menopausal 02/07/2023   Personal history of diabetic foot ulcer 02/07/2023   Acquired female bladder prolapse 02/07/2023   Functional urinary incontinence 02/07/2023   Nausea 09/20/2022   Gastroesophageal reflux disease with esophagitis without hemorrhage 08/03/2022   Type 2 diabetes mellitus with complications (HCC) 11/11/2021   Hyperlipidemia associated with type 2 diabetes mellitus (HCC) 07/12/2015   Paroxysmal atrial fibrillation (HCC) 07/12/2015   Morbid obesity (HCC) 05/17/2015    Hypertension associated with diabetes (HCC) 05/17/2015   Essential thrombocythemia (HCC) 10/01/2012    Past Surgical History:  Procedure Laterality Date   AMPUTATION TOE Right 11/11/2018   Procedure: AMPUTATION RIGHT 2ND TOE;  Surgeon: Yvone Rush, MD;  Location: [facility];  Service: Orthopedics;  Laterality: Right;   APPENDECTOMY     CARDIAC ELECTROPHYSIOLOGY STUDY AND ABLATION     cather ablation  2011/2014   cardiac ablation for a fib   COLONOSCOPY WITH PROPOFOL  N/A 02/11/2021   Procedure: COLONOSCOPY WITH PROPOFOL ;  Surgeon: Janalyn Keene NOVAK, MD;  Location: [facility] ENDOSCOPY;  Service: Endoscopy;  Laterality: N/A;   EYE SURGERY Bilateral 05/2014   cataract   HAMMER TOE SURGERY Right    HAND SURGERY Bilateral    LAPAROSCOPIC OOPHERECTOMY     recession of gums sx     right shoulder surgery Right 12/24/2023   TUBAL LIGATION      OB History     Gravida  3   Para  3   Term  1   Preterm  2   AB      Living  3      SAB      IAB      Ectopic      Multiple      Live Births               Home Medications    Prior to Admission medications   Medication Sig Start Date End Date Taking? Authorizing Provider  fluconazole  (DIFLUCAN ) 150 MG tablet Take 1 tablet (150 mg total) by mouth daily for 4 doses. 03/20/24 03/24/24 Yes Renu Asby, Shelba SAUNDERS, NP  aspirin  EC (ASPIRIN  LOW DOSE) 81 MG tablet Take 1 tablet (81 mg total) by mouth daily. SWALLOW WHOLE. 04/10/23   Emilio Kelly DASEN, FNP  Calcium  Carbonate-Vitamin D 600-200 MG-UNIT CAPS Take 1 tablet by mouth daily. 01/18/07   [provider]  Continuous Glucose Sensor (FREESTYLE LIBRE 2 SENSOR) MISC 1 each by Does not apply route every 14 (fourteen) days. 04/10/23   Emilio Kelly DASEN, FNP  dapagliflozin  propanediol (FARXIGA ) 10 MG TABS tablet Take 1 tablet (10 mg total) by mouth daily before breakfast. 02/04/24   Hope Merle, MD  lisinopril  (ZESTRIL ) 40 MG tablet Take 1 tablet (40 mg total) by mouth daily. 02/04/24   Hope Merle, MD  metoprolol  tartrate (LOPRESSOR ) 25 MG tablet Take 0.5 tablets (12.5 mg total) by mouth 2 (two) times daily. 02/04/24   Hope Merle, MD  Multiple Vitamin tablet Take 1 tablet by mouth daily. 01/18/07   [provider]  Omega-3 Fatty Acids (FISH OIL ) 1000 MG CAPS Take 1 capsule (1,000 mg total) by mouth daily. 04/10/23   Emilio Kelly DASEN, FNP  omeprazole  (PRILOSEC) 20 MG capsule Take 1 capsule (20 mg total) by mouth daily. 02/04/24   Hope Merle, MD  Semaglutide  (RYBELSUS ) 7 MG TABS Take 1 tablet (7 mg total) by mouth daily. 02/04/24   Hope Merle, MD  simvastatin  (ZOCOR ) 40 MG tablet Take 1 tablet (40 mg total) by mouth daily at 6 PM. 02/04/24   Hope Merle, MD  zinc gluconate 50 MG tablet Take 50 mg by mouth daily.    [provider]    Family History Family History  Problem Relation Age of Onset   Heart disease Mother    Heart disease Father    Hypertension Father    Ovarian cancer Sister    Cancer Sister        ovarian   Lung cancer Sister    Cancer Sister        lung and thyroid    Thyroid  cancer Sister    Cancer Sister    Diabetes Maternal Uncle    Diabetes Maternal Uncle    Breast cancer Neg Hx     Social History Social History   Tobacco Use   Smoking status: Never   Smokeless tobacco: Never  Vaping Use   Vaping status: Never Used  Substance Use Topics   Alcohol use: No   Drug use: No     Allergies   Multaq [dronedarone]   Review of Systems Review of Systems   Physical Exam Triage Vital Signs ED Triage Vitals  Encounter Vitals Group     BP 03/20/24 1432 (!) 146/77     Girls Systolic BP Percentile --      Girls Diastolic BP Percentile --      Boys Systolic BP Percentile --      Boys Diastolic BP Percentile --      Pulse Rate 03/20/24 1432 73     Resp 03/20/24 1432 18     Temp 03/20/24 1432 98.1 F (36.7 C)     Temp Source 03/20/24 1432 Oral     SpO2 03/20/24 1432 96 %     Weight --      Height --      Head Circumference --       Peak Flow --      Pain Score 03/20/24  1429 0     Pain Loc --      Pain Education --      Exclude from Growth Chart --    No data found.  Updated Vital Signs BP (!) 146/77 (BP Location: Left Arm)   Pulse 73   Temp 98.1 F (36.7 C) (Oral)   Resp 18   SpO2 96%   Visual Acuity Right Eye Distance:   Left Eye Distance:   Bilateral Distance:    Right Eye Near:   Left Eye Near:    Bilateral Near:     Physical Exam Constitutional:      Appearance: Normal appearance.  Eyes:     Extraocular Movements: Extraocular movements intact.  Pulmonary:     Effort: Pulmonary effort is normal.  Genitourinary:    Comments: Deferred Neurological:     Mental Status: She is alert and oriented to person, place, and time.      UC Treatments / Results  Labs (all labs ordered are listed, but only abnormal results are displayed) Labs Reviewed  CERVICOVAGINAL ANCILLARY ONLY    EKG   Radiology No results found.  Procedures Procedures (including critical care time)  Medications Ordered in UC Medications - No data to display  Initial Impression / Assessment and Plan / UC Course  I have reviewed the triage vital signs and the nursing notes.  Pertinent labs & imaging results that were available during my care of the patient were reviewed by me and considered in my medical decision making (see chart for details).  Vaginal itching  Treating for yeast, prescribed Diflucan , discussed administration, vaginal swab checking for yeast and BV pending, will treat additionally per protocol, given supportive care and may follow-up as needed Final Clinical Impressions(s) / UC Diagnoses   Final diagnoses:  Vaginal itching     Discharge Instructions      Today you are being treated for yeast.   Take diflucan  150 mg once, if symptoms still present in 3 days then you may take second pill   Yeast infections which are caused by a naturally occurring fungus called candida. Vaginosis is an  inflammation of the vagina that can result in discharge, itching and pain. The cause is usually a change in the normal balance of vaginal bacteria or an infection. Vaginosis can also result from reduced estrogen levels after menopause.  Labs pending 2-3 days, you will be contacted if positive and treatment will be sent to the pharmacy,   In addition:   Avoid baths, hot tubs and whirlpool spas.  Don't use scented or harsh soaps, such as those with deodorant or antibacterial action. Avoid irritants. These include scented tampons and pads. Wipe from front to back after using the toilet.  Don't douche. Your vagina doesn't require cleansing other than normal bathing.  Use a  condom. Wear cotton underwear, this fabric helps absorb moisture     ED Prescriptions     Medication Sig Dispense Auth. Provider   fluconazole  (DIFLUCAN ) 150 MG tablet Take 1 tablet (150 mg total) by mouth daily for 4 doses. 4 tablet Sevannah Madia R, NP      PDMP not reviewed this encounter.   Teresa Shelba SAUNDERS, NP 03/20/24 1442

## 2024-03-20 NOTE — Telephone Encounter (Signed)
 Called pt and she has an appointment with UC today.

## 2024-03-20 NOTE — Discharge Instructions (Signed)
 Today you are being treated for yeast.   Take diflucan  150 mg once, if symptoms still present in 3 days then you may take second pill   Yeast infections which are caused by a naturally occurring fungus called candida. Vaginosis is an inflammation of the vagina that can result in discharge, itching and pain. The cause is usually a change in the normal balance of vaginal bacteria or an infection. Vaginosis can also result from reduced estrogen levels after menopause.  Labs pending 2-3 days, you will be contacted if positive and treatment will be sent to the pharmacy,   In addition:   Avoid baths, hot tubs and whirlpool spas.  Don't use scented or harsh soaps, such as those with deodorant or antibacterial action. Avoid irritants. These include scented tampons and pads. Wipe from front to back after using the toilet.  Don't douche. Your vagina doesn't require cleansing other than normal bathing.  Use a  condom. Wear cotton underwear, this fabric helps absorb moisture

## 2024-03-20 NOTE — Telephone Encounter (Signed)
 FYI Only or Action Required?: FYI only for provider.  Patient was last seen in primary care on 02/04/2024 by Stacie Merle, MD.  Called Nurse Triage reporting Vaginal Itching.  Symptoms began yesterday.  Interventions attempted: OTC medications: Monistat.  Symptoms are: severe vaginal itching/irritation unchanged.  Triage Disposition: See Physician Within 24 Hours  Patient/caregiver understands and will follow disposition?: Yes                   Copied from CRM [id number]. Topic: Clinical - Red Word Triage >> Mar 20, 2024  8:10 AM Avram MATSU wrote: Red Word that prompted transfer to Nurse Triage: yeast infection, itchy,due to medication   ----------------------------------------------------------------------- From previous Reason for Contact - Scheduling: Patient/patient representative is calling to schedule an appointment. Refer to attachments for appointment information. Reason for Disposition  MODERATE-SEVERE itching (i.e., interferes with school, work, or sleep)  Answer Assessment - Initial Assessment Questions 1. SYMPTOM: What's the main symptom you're concerned about? (e.g., pain, itching, dryness)     Vaginal irritation/itch.  2. LOCATION: Where is the  itching located? (e.g., inside/outside, left/right)     Bilateral.  3. ONSET: When did the  itching  start?     Yesterday.  4. PAIN: Is there any pain? If Yes, ask: How bad is it? (Scale: 1-10; mild, moderate, severe)     No.  5. ITCHING: Is there any itching? If Yes, ask: How bad is it? (Scale: 1-10; mild, moderate, severe)     Yes, severe.  6. CAUSE: What do you think is causing the discharge? Have you had the same problem before? What happened then?     She states she gets yeast infections from her Farxiga  and she states she will usually get on diflucan  which takes care of it. Patient last seen in May for yeast and BV, treated with diflucan  and flagyl .  7. OTHER SYMPTOMS: Do you  have any other symptoms? (e.g., fever, itching, vaginal bleeding, pain with urination, injury to genital area, vaginal foreign body)     Denies vaginal discharge or bleeding, fever, pain or burning with urination.  8. PREGNANCY: Is there any chance you are pregnant? When was your last menstrual period?     N/A.  Protocols used: Vaginal Symptoms-A-AH

## 2024-03-20 NOTE — ED Triage Notes (Signed)
 Patient reports vaginal itching that started yesterday. Patient reports that she used Monistat 1 on yesterday with no relief.

## 2024-03-21 LAB — CERVICOVAGINAL ANCILLARY ONLY
Bacterial Vaginitis (gardnerella): NEGATIVE
Candida Glabrata: NEGATIVE
Candida Vaginitis: NEGATIVE
Comment: NEGATIVE
Comment: NEGATIVE
Comment: NEGATIVE

## 2024-04-11 ENCOUNTER — Other Ambulatory Visit: Payer: Self-pay | Admitting: Orthopaedic Surgery

## 2024-04-11 DIAGNOSIS — M19012 Primary osteoarthritis, left shoulder: Secondary | ICD-10-CM

## 2024-04-14 ENCOUNTER — Ambulatory Visit
Admission: RE | Admit: 2024-04-14 | Discharge: 2024-04-14 | Disposition: A | Discharge status: Home / Self Care | Source: Ambulatory Visit | Attending: Orthopaedic Surgery | Admitting: Orthopaedic Surgery

## 2024-04-14 DIAGNOSIS — M19012 Primary osteoarthritis, left shoulder: Secondary | ICD-10-CM

## 2024-04-15 ENCOUNTER — Other Ambulatory Visit: Payer: Self-pay | Admitting: Orthopaedic Surgery

## 2024-04-15 DIAGNOSIS — M75101 Unspecified rotator cuff tear or rupture of right shoulder, not specified as traumatic: Secondary | ICD-10-CM

## 2024-04-17 ENCOUNTER — Ambulatory Visit
Admission: RE | Admit: 2024-04-17 | Discharge: 2024-04-17 | Disposition: A | Discharge status: Home / Self Care | Source: Ambulatory Visit | Attending: Orthopaedic Surgery | Admitting: Orthopaedic Surgery

## 2024-04-17 DIAGNOSIS — M75101 Unspecified rotator cuff tear or rupture of right shoulder, not specified as traumatic: Secondary | ICD-10-CM | POA: Insufficient documentation

## 2024-05-15 ENCOUNTER — Ambulatory Visit (INDEPENDENT_AMBULATORY_CARE_PROVIDER_SITE_OTHER): Admitting: Dermatology

## 2024-05-15 DIAGNOSIS — Z85828 Personal history of other malignant neoplasm of skin: Secondary | ICD-10-CM

## 2024-05-15 DIAGNOSIS — L821 Other seborrheic keratosis: Secondary | ICD-10-CM | POA: Diagnosis not present

## 2024-05-15 DIAGNOSIS — D1801 Hemangioma of skin and subcutaneous tissue: Secondary | ICD-10-CM

## 2024-05-15 DIAGNOSIS — L578 Other skin changes due to chronic exposure to nonionizing radiation: Secondary | ICD-10-CM | POA: Diagnosis not present

## 2024-05-15 DIAGNOSIS — L814 Other melanin hyperpigmentation: Secondary | ICD-10-CM | POA: Diagnosis not present

## 2024-05-15 DIAGNOSIS — L820 Inflamed seborrheic keratosis: Secondary | ICD-10-CM

## 2024-05-15 DIAGNOSIS — W908XXA Exposure to other nonionizing radiation, initial encounter: Secondary | ICD-10-CM | POA: Diagnosis not present

## 2024-05-15 DIAGNOSIS — Z1283 Encounter for screening for malignant neoplasm of skin: Secondary | ICD-10-CM | POA: Diagnosis not present

## 2024-05-15 DIAGNOSIS — D229 Melanocytic nevi, unspecified: Secondary | ICD-10-CM

## 2024-05-15 NOTE — Progress Notes (Unsigned)
   Follow-Up Visit   Subjective  Stacie Porter is a 78 y.o. female who presents for the following: Skin Cancer Screening and Full Body Skin Exam, hx of BCC  The patient presents for Total-Body Skin Exam (TBSE) for skin cancer screening and mole check. The patient has spots, moles and lesions to be evaluated, some may be new or changing and the patient may have concern these could be cancer.  The following portions of the chart were reviewed this encounter and updated as appropriate: medications, allergies, medical history  Review of Systems:  No other skin or systemic complaints except as noted in HPI or Assessment and Plan.  Objective  Well appearing patient in no apparent distress; mood and affect are within normal limits.  A full examination was performed including scalp, head, eyes, ears, nose, lips, neck, chest, axillae, abdomen, back, buttocks, bilateral upper extremities, bilateral lower extremities, hands, feet, fingers, toes, fingernails, and toenails. All findings within normal limits unless otherwise noted below.   Relevant physical exam findings are noted in the Assessment and Plan.  right forehead x 1 Stuck-on, waxy, tan-brown papules and plaques -- Discussed benign etiology and prognosis.   Assessment & Plan   SKIN CANCER SCREENING PERFORMED TODAY.  ACTINIC DAMAGE - Chronic condition, secondary to cumulative UV/sun exposure - diffuse scaly erythematous macules with underlying dyspigmentation - Recommend daily broad spectrum sunscreen SPF 30+ to sun-exposed areas, reapply every 2 hours as needed.  - Staying in the shade or wearing long sleeves, sun glasses (UVA+UVB protection) and wide brim hats (4-inch brim around the entire circumference of the hat) are also recommended for sun protection.  - Call for new or changing lesions.  LENTIGINES, SEBORRHEIC KERATOSES, HEMANGIOMAS - Benign normal skin lesions - Benign-appearing - Call for any changes  MELANOCYTIC NEVI -  Tan-brown and/or pink-flesh-colored symmetric macules and papules - Benign appearing on exam today - Observation - Call clinic for new or changing moles - Recommend daily use of broad spectrum spf 30+ sunscreen to sun-exposed areas.   HISTORY OF BASAL CELL CARCINOMA OF THE SKIN - No evidence of recurrence today - Recommend regular full body skin exams - Recommend daily broad spectrum sunscreen SPF 30+ to sun-exposed areas, reapply every 2 hours as needed.  - Call if any new or changing lesions are noted between office visits   INFLAMED SEBORRHEIC KERATOSIS right forehead x 1 Symptomatic, irritating, patient would like treated.  Destruction of lesion - right forehead x 1 Complexity: simple   Destruction method: cryotherapy   Informed consent: discussed and consent obtained   Timeout:  patient name, date of birth, surgical site, and procedure verified Lesion destroyed using liquid nitrogen: Yes   Region frozen until ice ball extended beyond lesion: Yes   Outcome: patient tolerated procedure well with no complications   Post-procedure details: wound care instructions given     Return in about 1 year (around 05/15/2025) for TBSE, hx of BCC.  IFay Kirks, CMA, am acting as scribe for Alm Rhyme, MD .   Documentation: I have reviewed the above documentation for accuracy and completeness, and I agree with the above.  Alm Rhyme, MD

## 2024-05-15 NOTE — Patient Instructions (Signed)

## 2024-05-16 ENCOUNTER — Encounter: Payer: Self-pay | Admitting: Dermatology

## 2024-05-22 ENCOUNTER — Ambulatory Visit: Payer: Federal, State, Local not specified - PPO | Admitting: Dermatology

## 2024-07-30 ENCOUNTER — Other Ambulatory Visit: Payer: Self-pay | Admitting: Family Medicine

## 2024-07-30 DIAGNOSIS — R110 Nausea: Secondary | ICD-10-CM

## 2024-07-30 DIAGNOSIS — K219 Gastro-esophageal reflux disease without esophagitis: Secondary | ICD-10-CM

## 2024-08-04 ENCOUNTER — Ambulatory Visit: Admitting: Internal Medicine

## 2024-08-05 ENCOUNTER — Ambulatory Visit: Admission: RE | Admit: 2024-08-05 | Discharge: 2024-08-05 | Attending: Family Medicine | Admitting: Family Medicine

## 2024-08-05 DIAGNOSIS — K219 Gastro-esophageal reflux disease without esophagitis: Secondary | ICD-10-CM

## 2024-08-05 DIAGNOSIS — R110 Nausea: Secondary | ICD-10-CM

## 2024-08-25 ENCOUNTER — Other Ambulatory Visit: Payer: Self-pay | Admitting: Family Medicine

## 2024-08-25 DIAGNOSIS — Z1231 Encounter for screening mammogram for malignant neoplasm of breast: Secondary | ICD-10-CM

## 2024-09-08 ENCOUNTER — Encounter: Payer: Self-pay | Admitting: *Deleted

## 2024-10-03 ENCOUNTER — Ambulatory Visit: Admitting: Obstetrics and Gynecology

## 2024-10-03 ENCOUNTER — Encounter: Payer: Self-pay | Admitting: Obstetrics and Gynecology

## 2024-10-03 VITALS — BP 132/75 | HR 66

## 2024-10-03 DIAGNOSIS — N3281 Overactive bladder: Secondary | ICD-10-CM

## 2024-10-03 NOTE — Progress Notes (Signed)
 [facility] Return Visit  SUBJECTIVE  History of Present Illness: Stacie Porter is a 79 y.o. female seen in follow-up for mixed incontinence. She had urethral bulking on 07/19/23. She had two shoulder surgeries last year.   Having more bladder urgency and frequency. Leaks when water is running or when she gets an urge on the way to the bathroom. In the morning she sometimes has to go once an hour and then slows down in the afternoon. Urinates 1-2 times per night. But she drinks water at night. Denies any leakage with cough, sneeze.   Drinks: 30oz water, occasional unsweet tea or diet pepsi with meals  Denies feeling of a bulge in the vaginal area.   Last Hgb A1c was 8.4% on 08/12/24.   Past Medical History: Patient  has a past medical history of Cataract, Diabetes mellitus without complication (HCC), Dysrhythmia, GERD (gastroesophageal reflux disease), basal cell carcinoma (~2014 [facility]), Hyperlipidemia, Hypertension, Osteomyelitis of second toe of right foot (HCC) (11/11/2018), and Sleep apnea.   Past Surgical History: She  has a past surgical history that includes Appendectomy; Hand surgery (Bilateral); Hammer toe surgery (Right); Tubal ligation; cather ablation (2011/2014); Eye surgery (Bilateral, 05/2014); Laparoscopic oopherectomy; recession of gums sx; Cardiac electrophysiology study and ablation; Amputation toe (Right, 11/11/2018); Colonoscopy with propofol  (N/A, 02/11/2021); and right shoulder surgery (Right, 12/24/2023).   Medications: She has a current medication list which includes the following prescription(s): aspirin  ec, calcium  carbonate-vitamin d, freestyle libre 2 sensor, dapagliflozin  propanediol, lisinopril , metoprolol  tartrate, multiple vitamin, fish oil , omeprazole , rybelsus , simvastatin , and zinc gluconate.   Allergies: Patient is allergic to multaq [dronedarone].   Social History: Patient  reports that she has never smoked. She has never used smokeless  tobacco. She reports that she does not drink alcohol and does not use drugs.     OBJECTIVE     Physical Exam: Vitals:   10/03/24 1015 10/03/24 1016  BP: (!) 154/75 132/75  Pulse: 68 66    Gen: No apparent distress, A&O x 3.  Detailed Urogynecologic Evaluation:  Deferred.    ASSESSMENT AND PLAN    Stacie Porter is a 79 y.o. with:  1. Overactive bladder     - No SUI symptoms - We discussed she may be having increasing symptoms due to worsening glucose control.  - Advised her to reduce intake of bladder irritants such as soda or tea.  - For OAB, we discussed options of pelvic PT, another medication, or third line therapies such as PTNS, intravesical botox and sacral nerve stimulation. She is interested in PTNS. Discussed this is done for 12 weeks then monthly maintenance after. Will have her complete a 3 day baseline bladder diary and return for her first session.   Stacie LOISE Caper, MD

## 2024-10-08 ENCOUNTER — Encounter

## 2024-10-14 ENCOUNTER — Encounter

## 2024-10-22 ENCOUNTER — Ambulatory Visit

## 2024-10-29 ENCOUNTER — Ambulatory Visit

## 2024-11-05 ENCOUNTER — Ambulatory Visit

## 2024-11-12 ENCOUNTER — Ambulatory Visit

## 2024-11-19 ENCOUNTER — Ambulatory Visit

## 2024-11-26 ENCOUNTER — Ambulatory Visit

## 2025-01-05 ENCOUNTER — Ambulatory Visit

## 2025-05-19 ENCOUNTER — Encounter: Admitting: Dermatology
